# Patient Record
Sex: Male | Born: 1940
Health system: Southern US, Community
[De-identification: ages and names within clinical notes are randomized; demographics above are authoritative.]

## PROBLEM LIST (undated history)

## (undated) DIAGNOSIS — Z860100 Personal history of colon polyps, unspecified: Secondary | ICD-10-CM

## (undated) DIAGNOSIS — K921 Melena: Secondary | ICD-10-CM

## (undated) DIAGNOSIS — D696 Thrombocytopenia, unspecified: Secondary | ICD-10-CM

## (undated) DIAGNOSIS — B019 Varicella without complication: Secondary | ICD-10-CM

## (undated) DIAGNOSIS — R42 Dizziness and giddiness: Secondary | ICD-10-CM

## (undated) DIAGNOSIS — Z9889 Other specified postprocedural states: Secondary | ICD-10-CM

## (undated) DIAGNOSIS — I4891 Unspecified atrial fibrillation: Secondary | ICD-10-CM

## (undated) DIAGNOSIS — S86819A Strain of other muscle(s) and tendon(s) at lower leg level, unspecified leg, initial encounter: Secondary | ICD-10-CM

## (undated) DIAGNOSIS — S838X9A Sprain of other specified parts of unspecified knee, initial encounter: Secondary | ICD-10-CM

## (undated) DIAGNOSIS — M1990 Unspecified osteoarthritis, unspecified site: Secondary | ICD-10-CM

## (undated) DIAGNOSIS — K219 Gastro-esophageal reflux disease without esophagitis: Secondary | ICD-10-CM

## (undated) DIAGNOSIS — I493 Ventricular premature depolarization: Secondary | ICD-10-CM

## (undated) DIAGNOSIS — E785 Hyperlipidemia, unspecified: Secondary | ICD-10-CM

## (undated) DIAGNOSIS — I4949 Other premature depolarization: Secondary | ICD-10-CM

## (undated) DIAGNOSIS — I498 Other specified cardiac arrhythmias: Secondary | ICD-10-CM

## (undated) DIAGNOSIS — R531 Weakness: Secondary | ICD-10-CM

## (undated) DIAGNOSIS — R079 Chest pain, unspecified: Secondary | ICD-10-CM

## (undated) DIAGNOSIS — J301 Allergic rhinitis due to pollen: Secondary | ICD-10-CM

## (undated) DIAGNOSIS — F4323 Adjustment disorder with mixed anxiety and depressed mood: Secondary | ICD-10-CM

## (undated) HISTORY — DX: Other specified cardiac arrhythmias: I49.8

## (undated) HISTORY — DX: Personal history of colonic polyps: Z86.010

## (undated) HISTORY — DX: Strain of other muscle(s) and tendon(s) at lower leg level, unspecified leg, initial encounter: S86.819A

## (undated) HISTORY — DX: Gastro-esophageal reflux disease without esophagitis: K21.9

## (undated) HISTORY — DX: Thrombocytopenia, unspecified: D69.6

## (undated) HISTORY — DX: Melena: K92.1

## (undated) HISTORY — DX: Other premature depolarization: I49.49

## (undated) HISTORY — DX: Weakness: R53.1

## (undated) HISTORY — DX: Other specified postprocedural states: Z98.890

## (undated) HISTORY — DX: Unspecified osteoarthritis, unspecified site: M19.90

## (undated) HISTORY — DX: Ventricular premature depolarization: I49.3

## (undated) HISTORY — DX: Personal history of colon polyps, unspecified: Z86.0100

## (undated) HISTORY — DX: Hyperlipidemia, unspecified: E78.5

## (undated) HISTORY — DX: Adjustment disorder with mixed anxiety and depressed mood: F43.23

## (undated) HISTORY — DX: Sprain of other specified parts of unspecified knee, initial encounter: S83.8X9A

## (undated) HISTORY — DX: Allergic rhinitis due to pollen: J30.1

## (undated) HISTORY — DX: Dizziness and giddiness: R42

## (undated) HISTORY — DX: Chest pain, unspecified: R07.9

## (undated) HISTORY — DX: Unspecified atrial fibrillation: I48.91

## (undated) HISTORY — DX: Varicella without complication: B01.9

---

## 1984-08-09 HISTORY — PX: OTHER SURGICAL HISTORY: SHX169

## 1994-08-09 HISTORY — PX: OTHER SURGICAL HISTORY: SHX169

## 2000-12-29 ENCOUNTER — Ambulatory Visit (HOSPITAL_BASED_OUTPATIENT_CLINIC_OR_DEPARTMENT_OTHER): Admission: RE | Admit: 2000-12-29 | Discharge: 2000-12-29 | Payer: Self-pay | Admitting: Orthopedic Surgery

## 2001-04-28 ENCOUNTER — Encounter (INDEPENDENT_AMBULATORY_CARE_PROVIDER_SITE_OTHER): Payer: Self-pay | Admitting: Specialist

## 2001-04-28 ENCOUNTER — Other Ambulatory Visit: Admission: RE | Admit: 2001-04-28 | Discharge: 2001-04-28 | Payer: Self-pay | Admitting: Gastroenterology

## 2003-10-31 ENCOUNTER — Encounter: Payer: Self-pay | Admitting: Family Medicine

## 2005-08-09 HISTORY — PX: ABDOMINAL ADHESION SURGERY: SHX90

## 2006-08-12 LAB — HM COLONOSCOPY: HM Colonoscopy: NORMAL

## 2007-02-10 ENCOUNTER — Emergency Department (HOSPITAL_COMMUNITY): Admission: EM | Admit: 2007-02-10 | Discharge: 2007-02-10 | Payer: Self-pay | Admitting: *Deleted

## 2009-01-13 ENCOUNTER — Ambulatory Visit: Payer: Self-pay | Admitting: Family Medicine

## 2009-01-13 DIAGNOSIS — Z860100 Personal history of colon polyps, unspecified: Secondary | ICD-10-CM

## 2009-01-13 DIAGNOSIS — E785 Hyperlipidemia, unspecified: Secondary | ICD-10-CM | POA: Insufficient documentation

## 2009-01-13 HISTORY — DX: Personal history of colonic polyps: Z86.010

## 2009-01-13 HISTORY — DX: Personal history of colon polyps, unspecified: Z86.0100

## 2009-05-08 ENCOUNTER — Encounter: Payer: Self-pay | Admitting: Family Medicine

## 2009-09-08 ENCOUNTER — Ambulatory Visit: Payer: Self-pay | Admitting: Family Medicine

## 2009-09-08 DIAGNOSIS — S838X9A Sprain of other specified parts of unspecified knee, initial encounter: Secondary | ICD-10-CM

## 2009-09-08 DIAGNOSIS — F4323 Adjustment disorder with mixed anxiety and depressed mood: Secondary | ICD-10-CM

## 2009-09-08 DIAGNOSIS — S86819A Strain of other muscle(s) and tendon(s) at lower leg level, unspecified leg, initial encounter: Secondary | ICD-10-CM

## 2009-09-08 HISTORY — DX: Adjustment disorder with mixed anxiety and depressed mood: F43.23

## 2009-09-08 HISTORY — DX: Sprain of other specified parts of unspecified knee, initial encounter: S83.8X9A

## 2009-09-10 ENCOUNTER — Encounter: Admission: RE | Admit: 2009-09-10 | Discharge: 2009-09-23 | Payer: Self-pay | Admitting: Family Medicine

## 2009-09-10 ENCOUNTER — Encounter: Payer: Self-pay | Admitting: Family Medicine

## 2009-09-11 ENCOUNTER — Ambulatory Visit: Payer: Self-pay | Admitting: Licensed Clinical Social Worker

## 2009-09-15 ENCOUNTER — Ambulatory Visit: Payer: Self-pay | Admitting: Licensed Clinical Social Worker

## 2009-09-22 ENCOUNTER — Encounter: Payer: Self-pay | Admitting: Family Medicine

## 2009-09-25 ENCOUNTER — Ambulatory Visit: Payer: Self-pay | Admitting: Family Medicine

## 2009-09-25 LAB — CONVERTED CEMR LAB
ALT: 20 units/L (ref 0–53)
AST: 26 units/L (ref 0–37)
Albumin: 4 g/dL (ref 3.5–5.2)
Alkaline Phosphatase: 42 units/L (ref 39–117)
BUN: 15 mg/dL (ref 6–23)
Basophils Absolute: 0 10*3/uL (ref 0.0–0.1)
Basophils Relative: 0.4 % (ref 0.0–3.0)
Bilirubin Urine: NEGATIVE
Bilirubin, Direct: 0.1 mg/dL (ref 0.0–0.3)
Blood in Urine, dipstick: NEGATIVE
CO2: 31 meq/L (ref 19–32)
Calcium: 9.2 mg/dL (ref 8.4–10.5)
Chloride: 107 meq/L (ref 96–112)
Cholesterol: 145 mg/dL (ref 0–200)
Creatinine, Ser: 1 mg/dL (ref 0.4–1.5)
Eosinophils Absolute: 0.1 10*3/uL (ref 0.0–0.7)
Eosinophils Relative: 1 % (ref 0.0–5.0)
GFR calc non Af Amer: 79.04 mL/min (ref >60)
Glucose, Bld: 109 mg/dL — ABNORMAL HIGH (ref 70–99)
Glucose, Urine, Semiquant: NEGATIVE
HCT: 44.8 % (ref 39.0–52.0)
HDL: 52.3 mg/dL (ref >39.00)
Hemoglobin: 14.8 g/dL (ref 13.0–17.0)
Ketones, urine, test strip: NEGATIVE
LDL Cholesterol: 68 mg/dL (ref 0–99)
Lymphocytes Relative: 39.2 % (ref 12.0–46.0)
Lymphs Abs: 2.2 10*3/uL (ref 0.7–4.0)
MCHC: 33 g/dL (ref 30.0–36.0)
MCV: 97.5 fL (ref 78.0–100.0)
Monocytes Absolute: 0.5 10*3/uL (ref 0.1–1.0)
Monocytes Relative: 8.9 % (ref 3.0–12.0)
Neutro Abs: 2.8 10*3/uL (ref 1.4–7.7)
Neutrophils Relative %: 50.5 % (ref 43.0–77.0)
Nitrite: NEGATIVE
PSA: 1.53 ng/mL (ref 0.10–4.00)
Platelets: 142 10*3/uL — ABNORMAL LOW (ref 150.0–400.0)
Potassium: 4.5 meq/L (ref 3.5–5.1)
Protein, U semiquant: NEGATIVE
RBC: 4.59 M/uL (ref 4.22–5.81)
RDW: 12.6 % (ref 11.5–14.6)
Sodium: 143 meq/L (ref 135–145)
Specific Gravity, Urine: 1.025
TSH: 1.68 microintl units/mL (ref 0.35–5.50)
Total Bilirubin: 0.8 mg/dL (ref 0.3–1.2)
Total CHOL/HDL Ratio: 3
Total Protein: 7.1 g/dL (ref 6.0–8.3)
Triglycerides: 125 mg/dL (ref 0.0–149.0)
Urobilinogen, UA: 0.2
VLDL: 25 mg/dL (ref 0.0–40.0)
WBC Urine, dipstick: NEGATIVE
WBC: 5.6 10*3/uL (ref 4.5–10.5)
pH: 6.5

## 2009-10-03 ENCOUNTER — Ambulatory Visit: Payer: Self-pay | Admitting: Family Medicine

## 2009-12-19 ENCOUNTER — Ambulatory Visit: Payer: Self-pay | Admitting: Family Medicine

## 2009-12-22 ENCOUNTER — Telehealth: Payer: Self-pay | Admitting: Family Medicine

## 2009-12-22 LAB — CONVERTED CEMR LAB
Basophils Relative: 0.7 % (ref 0.0–3.0)
Eosinophils Absolute: 0.1 10*3/uL (ref 0.0–0.7)
Hemoglobin: 15 g/dL (ref 13.0–17.0)
Lymphocytes Relative: 46.1 % — ABNORMAL HIGH (ref 12.0–46.0)
MCHC: 34.4 g/dL (ref 30.0–36.0)
Neutro Abs: 2.5 10*3/uL (ref 1.4–7.7)
RBC: 4.54 M/uL (ref 4.22–5.81)

## 2010-03-05 ENCOUNTER — Ambulatory Visit: Payer: Self-pay | Admitting: Family Medicine

## 2010-03-05 DIAGNOSIS — R42 Dizziness and giddiness: Secondary | ICD-10-CM

## 2010-03-05 DIAGNOSIS — D696 Thrombocytopenia, unspecified: Secondary | ICD-10-CM

## 2010-03-05 HISTORY — DX: Dizziness and giddiness: R42

## 2010-03-05 HISTORY — DX: Thrombocytopenia, unspecified: D69.6

## 2010-03-10 ENCOUNTER — Ambulatory Visit: Payer: Self-pay | Admitting: Family Medicine

## 2010-03-10 DIAGNOSIS — I498 Other specified cardiac arrhythmias: Secondary | ICD-10-CM

## 2010-03-10 HISTORY — DX: Other specified cardiac arrhythmias: I49.8

## 2010-03-12 ENCOUNTER — Telehealth: Payer: Self-pay | Admitting: Family Medicine

## 2010-03-13 ENCOUNTER — Ambulatory Visit: Payer: Self-pay

## 2010-03-13 ENCOUNTER — Encounter: Payer: Self-pay | Admitting: Cardiovascular Disease

## 2010-03-16 ENCOUNTER — Emergency Department (HOSPITAL_COMMUNITY): Admission: EM | Admit: 2010-03-16 | Discharge: 2010-03-16 | Payer: Self-pay | Admitting: Emergency Medicine

## 2010-03-16 ENCOUNTER — Ambulatory Visit: Payer: Self-pay | Admitting: Cardiology

## 2010-03-16 ENCOUNTER — Telehealth: Payer: Self-pay | Admitting: Family Medicine

## 2010-03-16 ENCOUNTER — Encounter: Payer: Self-pay | Admitting: Internal Medicine

## 2010-03-17 ENCOUNTER — Telehealth: Payer: Self-pay | Admitting: Family Medicine

## 2010-03-17 ENCOUNTER — Ambulatory Visit: Payer: Self-pay | Admitting: Internal Medicine

## 2010-03-17 DIAGNOSIS — I4949 Other premature depolarization: Secondary | ICD-10-CM | POA: Insufficient documentation

## 2010-03-17 HISTORY — DX: Other premature depolarization: I49.49

## 2010-03-19 ENCOUNTER — Telehealth: Payer: Self-pay | Admitting: Internal Medicine

## 2010-03-19 ENCOUNTER — Telehealth: Payer: Self-pay | Admitting: Family Medicine

## 2010-03-19 DIAGNOSIS — R079 Chest pain, unspecified: Secondary | ICD-10-CM

## 2010-03-19 HISTORY — DX: Chest pain, unspecified: R07.9

## 2010-03-24 ENCOUNTER — Telehealth (INDEPENDENT_AMBULATORY_CARE_PROVIDER_SITE_OTHER): Payer: Self-pay | Admitting: *Deleted

## 2010-03-25 ENCOUNTER — Encounter: Payer: Self-pay | Admitting: Cardiovascular Disease

## 2010-03-25 ENCOUNTER — Encounter (HOSPITAL_COMMUNITY): Admission: RE | Admit: 2010-03-25 | Discharge: 2010-04-28 | Payer: Self-pay | Admitting: Cardiovascular Disease

## 2010-03-25 ENCOUNTER — Ambulatory Visit: Payer: Self-pay | Admitting: Cardiovascular Disease

## 2010-03-25 ENCOUNTER — Ambulatory Visit: Payer: Self-pay

## 2010-03-30 ENCOUNTER — Ambulatory Visit: Payer: Self-pay | Admitting: Internal Medicine

## 2010-03-30 ENCOUNTER — Ambulatory Visit: Payer: Self-pay

## 2010-03-30 ENCOUNTER — Ambulatory Visit (HOSPITAL_COMMUNITY): Admission: RE | Admit: 2010-03-30 | Discharge: 2010-03-30 | Payer: Self-pay | Admitting: Internal Medicine

## 2010-04-03 ENCOUNTER — Telehealth: Payer: Self-pay | Admitting: Family Medicine

## 2010-04-03 ENCOUNTER — Telehealth: Payer: Self-pay | Admitting: Internal Medicine

## 2010-04-07 ENCOUNTER — Telehealth: Payer: Self-pay | Admitting: Family Medicine

## 2010-04-10 ENCOUNTER — Telehealth: Payer: Self-pay | Admitting: Internal Medicine

## 2010-05-06 ENCOUNTER — Ambulatory Visit: Payer: Self-pay | Admitting: Internal Medicine

## 2010-05-18 ENCOUNTER — Telehealth: Payer: Self-pay | Admitting: Internal Medicine

## 2010-05-18 ENCOUNTER — Telehealth: Payer: Self-pay | Admitting: Family Medicine

## 2010-07-16 ENCOUNTER — Ambulatory Visit: Payer: Self-pay | Admitting: Internal Medicine

## 2010-07-16 ENCOUNTER — Encounter: Payer: Self-pay | Admitting: Internal Medicine

## 2010-09-06 LAB — CONVERTED CEMR LAB
Basophils Absolute: 0 10*3/uL (ref 0.0–0.1)
Eosinophils Absolute: 0.1 10*3/uL (ref 0.0–0.7)
HCT: 43.3 % (ref 39.0–52.0)
Hemoglobin: 14.9 g/dL (ref 13.0–17.0)
Lymphocytes Relative: 41.9 % (ref 12.0–46.0)
Lymphs Abs: 2.5 10*3/uL (ref 0.7–4.0)
MCHC: 34.5 g/dL (ref 30.0–36.0)
MCV: 96.3 fL (ref 78.0–100.0)
Monocytes Absolute: 0.5 10*3/uL (ref 0.1–1.0)
Neutro Abs: 2.7 10*3/uL (ref 1.4–7.7)
RDW: 13.9 % (ref 11.5–14.6)

## 2010-09-10 ENCOUNTER — Telehealth: Payer: Self-pay | Admitting: Internal Medicine

## 2010-09-10 NOTE — Assessment & Plan Note (Signed)
Summary: L THIGH PAIN // RS   Vital Signs:  Patient profile:   70 year old male Temp:     98.8 degrees F oral BP sitting:   120 / 90  (left arm) Cuff size:   regular  Vitals Entered By: Sid Falcon LPN (September 08, 2009 11:57 AM) CC: Left thigh pain X several years   History of Present Illness: Patient seen with left hamstring pain 1-1/2 years duration. Saw orthopedist when this first started. Initially thought he may have strained this doing hamstring exercises. Pain has been very low grade and normal since then and actually improved and goes away with exercise and noted after periods of inactivity. Discomfort along mid aspect of hamstring. Heat and local massage help. Recently resumed hamstring exercises. Never had any physical therapy.  No hip pain. Denies any appetite or weight changes. Stop simvastatin several weeks ago wondering if this was a precipitant but this did not help.  Other issue is increased stress and anxiety issues and possibly some depressive issues. Increased stressors with daughter battling alcoholism he has 2 elderly friends he helps take care of.  generally sleeps okay. Exercising regularly. No suicidal ideation.  Feels overloaded with stress at times.  Preventive Screening-Counseling & Management  Alcohol-Tobacco     Smoking Status: never     Year Started: 1961     Year Quit: 1973  Caffeine-Diet-Exercise     Does Patient Exercise: yes  Allergies: No Known Drug Allergies  Past History:  Past Medical History: Last updated: 01/13/2009 Arthritis Blood in stool Chicken pox hay fever/allergies Colonic polyps, hx of Hyperlipidemia  Past Surgical History: Last updated: 01/13/2009 Herniated disk surgeries, C 6-7 1986, L-5 1996 Compression L-2 lumbar 2 benign lesions removed from abdomen 07'  Social History: Last updated: 09/08/2009 Retired Married Never Smoked Regular exercise-yes  Social History: Retired Married Never Smoked Regular  exercise-yes Smoking Status:  never Does Patient Exercise:  yes  Review of Systems  The patient denies anorexia, fever, weight loss, chest pain, dyspnea on exertion, peripheral edema, hematuria, incontinence, muscle weakness, suspicious skin lesions, and enlarged lymph nodes.    Physical Exam  General:  Well-developed,well-nourished,in no acute distress; alert,appropriate and cooperative throughout examination Neck:  No deformities, masses, or tenderness noted. Lungs:  Normal respiratory effort, chest expands symmetrically. Lungs are clear to auscultation, no crackles or wheezes. Heart:  Normal rate and regular rhythm. S1 and S2 normal without gallop, murmur, click, rub or other extra sounds. Extremities:  no edema. 2+ DP pulses bilaterally.  No reproducible hamstring tenderness. Neurologic:  full strength lower extremities.  2 plus reflexes throughout.  Normal sensory function. Skin:  no rashes and no suspicious lesions.     Impression & Recommendations:  Problem # 1:  MUSCLE STRAIN, HAMSTRING MUSCLE (ICD-844.8) Assessment New Trial of PT. Orders: Physical Therapy Referral (PT)  Problem # 2:  ADJ DISORDER WITH MIXED ANXIETY & DEPRESSED MOOD (ICD-309.28) Assessment: New Recommended consideration for counseling and names given.  Start Sertraline 50 mg once daily and office f/u 3 weeks.  Complete Medication List: 1)  Simvastatin 20 Mg Tabs (Simvastatin) .... Once daily 2)  Sertraline Hcl 50 Mg Tabs (Sertraline hcl) .... One by mouth once daily  Patient Instructions: 1)  Schedule followup visit in approximately 3 weeks to reassess Prescriptions: SERTRALINE HCL 50 MG TABS (SERTRALINE HCL) one by mouth once daily  #30 x 5   Entered and Authorized by:   Evelena Peat MD   Signed by:   Smitty Cords  Elizaveta Mattice MD on 09/08/2009   Method used:   Print then Give to Patient   RxID:   [id number]

## 2010-09-10 NOTE — Progress Notes (Signed)
Summary: Symptoms worse, going to [facility] ER  Phone Note Call from Patient Call back at Work Phone [phone]   Caller: Patient Call For: Evelena Peat MD Summary of Call: VM from pt reporting his bradycardia is getting worse...his wife is taking him to the hospital.  He did drop off ehr heart monitor to Cardiology this AM, however they cannot see him until 8/25.  He feels his symptoms have deterioriated.   He would like to talk to Dr Caryl Never.  cell [phone], wife cell [phone] Initial call taken by: Sid Falcon LPN,  March 16, 2010 12:44 PM  Follow-up for Phone Call        spoke with pt .  Has had progressive symptoms of dizziness and weakness with resting pulse 40s.  He turned in Event monitor today.  He is at hospital now with ER eval pending.  He has no syncope or chest pain. Follow-up by: Evelena Peat MD,  March 16, 2010 1:04 PM

## 2010-09-10 NOTE — Assessment & Plan Note (Signed)
Summary: CPX // RS   Vital Signs:  Patient profile:   70 year old male Height:      68.25 inches Weight:      175 pounds Temp:     98.0 degrees F oral Pulse rate:   80 / minute Pulse rhythm:   regular Resp:     12 per minute BP sitting:   130 / 82  (left arm) Cuff size:   regular  Vitals Entered By: Sid Falcon LPN (October 03, 2009 10:29 AM) CC: CPX, labs done   History of Present Illness: Patient seen for complete physical examination. History of hyperlipidemia treated with simvastatin. Last tetanus 2002. Had Pneumovax age 66. Prior Zostavax vaccine. Colonoscopy 2008 and normal.  Patient had some recent depressive issues. Took sertraline only for a few days and had profuse perspiration with exercise and stopped. Has had some counseling. Overall feels he is coping much better. Exercises regularly.    Preventive Screening-Counseling & Management  Alcohol-Tobacco     Smoking Status: quit     Year Quit: 1975  Allergies (verified): No Known Drug Allergies  Past History:  Past Medical History: Last updated: 01/13/2009 Arthritis Blood in stool Chicken pox hay fever/allergies Colonic polyps, hx of Hyperlipidemia  Past Surgical History: Last updated: 01/13/2009 Herniated disk surgeries, C 6-7 1986, L-5 1996 Compression L-2 lumbar 2 benign lesions removed from abdomen 07'  Family History: Last updated: 01/13/2009 Family History Hypertension Family history Sudden death Leukemia, brother  Social History: Last updated: 09/08/2009 Retired Married Never Smoked Regular exercise-yes  Risk Factors: Exercise: yes (09/08/2009)  Risk Factors: Smoking Status: quit (10/03/2009) PMH-FH-SH reviewed for relevance  Social History: Smoking Status:  quit  Review of Systems  The patient denies anorexia, fever, weight loss, weight gain, vision loss, decreased hearing, hoarseness, chest pain, syncope, dyspnea on exertion, peripheral edema, prolonged cough, headaches,  hemoptysis, abdominal pain, melena, hematochezia, severe indigestion/heartburn, hematuria, incontinence, genital sores, muscle weakness, suspicious skin lesions, transient blindness, difficulty walking, unusual weight change, abnormal bleeding, enlarged lymph nodes, and testicular masses.    Physical Exam  General:  Well-developed,well-nourished,in no acute distress; alert,appropriate and cooperative throughout examination Eyes:  No corneal or conjunctival inflammation noted. EOMI. Perrla. Funduscopic exam benign, without hemorrhages, exudates or papilledema. Vision grossly normal. Ears:  External ear exam shows no significant lesions or deformities.  Otoscopic examination reveals clear canals, tympanic membranes are intact bilaterally without bulging, retraction, inflammation or discharge. Hearing is grossly normal bilaterally. Mouth:  Oral mucosa and oropharynx without lesions or exudates.  Teeth in good repair. Neck:  No deformities, masses, or tenderness noted. Lungs:  Normal respiratory effort, chest expands symmetrically. Lungs are clear to auscultation, no crackles or wheezes. Heart:  Normal rate and regular rhythm. S1 and S2 normal without gallop, murmur, click, rub or other extra sounds. Abdomen:  Bowel sounds positive,abdomen soft and non-tender without masses, organomegaly or hernias noted. Genitalia:  Testes bilaterally descended without nodularity, tenderness or masses. No scrotal masses or lesions. No penis lesions or urethral discharge. Extremities:  No clubbing, cyanosis, edema, or deformity noted with normal full range of motion of all joints.   Neurologic:  No cranial nerve deficits noted. Station and gait are normal. Plantar reflexes are down-going bilaterally. DTRs are symmetrical throughout. Sensory, motor and coordinative functions appear intact. Skin:  Intact without suspicious lesions or rashes Cervical Nodes:  No lymphadenopathy noted Psych:  Cognition and judgment appear  intact. Alert and cooperative with normal attention span and concentration. No apparent delusions, illusions,  hallucinations   Impression & Recommendations:  Problem # 1:  Preventive Health Care (ICD-V70.0) labs reviewed with patient and all favorable with the exception of mildly decreased platelet count 142,000. Patient will check prior labs from [place] for comparison.  CBC in one month if this is a new finding.  immunizations up to date. cont regular exercise.   Complete Medication List: 1)  Simvastatin 20 Mg Tabs (Simvastatin) .... Once daily  Patient Instructions: 1)  It is important that you exercise reguarly at least 20 minutes 5 times a week. If you develop chest pain, have severe difficulty breathing, or feel very tired, stop exercising immediately and seek medical attention.  2)  Check prior labs and if platelet count was not low previously (< 150,000) ,followup in one to 2 months for repeat CBC

## 2010-09-10 NOTE — Progress Notes (Signed)
Summary: pt has medication question  Phone Note Call from Patient Call back at Home Phone [phone]   Caller: Patient Reason for Call: Talk to Nurse, Talk to Doctor Summary of Call: pt wants to know should he go back on zoloft since he was told his irregular heart rate is due to stress Initial call taken by: Omer Jack,  April 03, 2010 1:08 PM  Follow-up for Phone Call        I spoke with the pt and he is wondering if the family stress that he is dealing with could be causing his palpitations.  The pt said he noticed his symptoms around the time his family stress started.  I asked the pt to speak with his PCP in regards to Zoloft.  Pt agreed with plan. I spoke with Orlinda Blalock D and this pt can take Zoloft and Metoprolol  Follow-up by: Julieta Gutting, RN, BSN,  April 03, 2010 1:45 PM

## 2010-09-10 NOTE — Miscellaneous (Signed)
Summary: Discharge Summary for PT [facility]  Discharge Summary for PT Services/[place]   Imported By: Maryln Gottron 10/01/2009 10:29:36  _____________________________________________________________________  External Attachment:    Type:   Image     Comment:   External Document

## 2010-09-10 NOTE — Progress Notes (Signed)
Summary: nuc pre procedure  Phone Note Outgoing Call Call back at Home Phone [phone]   Call placed by: CHasspacher,RN Call placed to: Patient Action Taken: Phone Call Completed Reason for Call: Confirm/change Appt Summary of Call: Left message with information on [employer] Information Sheet (see scanned document for details).      Nuclear Med Background Indications for Stress Test: Evaluation for Ischemia, Post Hospital  Indications Comments: 03/17/10 ER at [facility] with palpitations/presyncope    Symptoms: Chest Pain with Exertion, Dizziness, Fatigue with Exertion, Light-Headedness, Palpitations  Symptoms Comments: PVC's frequent   Nuclear Pre-Procedure Cardiac Risk Factors: Lipids Height (in): 68.25

## 2010-09-10 NOTE — Progress Notes (Signed)
Summary: referral and talk to Dr. Caryl Never.  Phone Note Call from Patient   Caller: Patient Call For: Evelena Peat MD Summary of Call: [phone] Pt would like a referral to a Cardiologist ASAP, and to talk to Dr. Caryl Never.  Initial call taken by: Lynann Beaver CMA,  March 12, 2010 2:16 PM  Follow-up for Phone Call        spoke with pt.  Heart rate occ down to 36 at rest.  No syncope.  Event monitor set up and will proceed with referral to EP specialist. Follow-up by: Evelena Peat MD,  March 12, 2010 3:23 PM

## 2010-09-10 NOTE — Progress Notes (Signed)
Summary: simvastatin ?  Phone Note Call from Patient Call back at Home Phone [phone]   Caller: vm Summary of Call: Can simvastatin be contributing to PVC's?  If so, can I stop it immediately and adjust diet? Knows it will be tomorrow when Dr. B gets message.  Rudy Jew, RN  May 18, 2010 11:03 AM  Initial call taken by: Rudy Jew, RN,  May 18, 2010 9:37 AM  Follow-up for Phone Call        I am not aware of any evidence linking Simvastatin with PVCs and my recommendation would be to continue with his cholesterol med. Follow-up by: Evelena Peat MD,  May 18, 2010 4:59 PM  Additional Follow-up for Phone Call Additional follow up Details #1::        Phone Call Completed Additional Follow-up by: Rudy Jew, RN,  May 22, 2010 9:06 AM

## 2010-09-10 NOTE — Progress Notes (Signed)
Summary: FYI  Phone Note Call from Patient   Caller: Patient Call For: Evelena Peat MD Summary of Call: Pt wants to be sure Dr Caryl Never is aware of his chest pain episode this am while exercising.  Has spoken to Cardiology, and does not need t see Dr. Caryl Never. Initial call taken by: Lynann Beaver CMA,  March 19, 2010 2:32 PM  Follow-up for Phone Call        noted Follow-up by: Evelena Peat MD,  March 19, 2010 3:31 PM

## 2010-09-10 NOTE — Assessment & Plan Note (Signed)
Summary: PER CHECK OUT/SF   Visit Type:  Follow-up Primary Provider:  Evelena Peat MD   History of Present Illness: Don Medina returns today for followup.  He is a pleasant 70 yo man with a h/o bradycardia, symptomatic PVC's and orthostasis.   He had a 2D echo which demonstrated normal LV function.He exercises regularly and has no limitation.  He does note some fatigue while on the low dose of beta blocker. His palpitations are much improved but he continues to be bothered by orthostasis. He denies frank syncope.  Current Medications (verified): 1)  Simvastatin 20 Mg Tabs (Simvastatin) .... Once Daily 2)  Metoprolol Succinate 25 Mg Xr24h-Tab (Metoprolol Succinate) .... Take 1 Tablet By Mouth Daily 3)  Aspirin 81 Mg Tbec (Aspirin) .... Take One Tablet By Mouth Daily 4)  Glucosamine-Chondroitin   Caps (Glucosamine-Chondroit-Vit C-Mn) .... Once Daily 5)  Fish Oil   Oil (Fish Oil) .... 1200mg  Two Times A Day 6)  Folic Acid   Powd (Folic Acid) .... 800mg  Qd 7)  Calcium-Vitamin D .... Once Daily  Allergies (verified): No Known Drug Allergies  Past History:  Past Medical History: Last updated: 01/13/2009 Arthritis Blood in stool Chicken pox hay fever/allergies Colonic polyps, hx of Hyperlipidemia  Past Surgical History: Last updated: 01/13/2009 Herniated disk surgeries, C 6-7 1986, L-5 1996 Compression L-2 lumbar 2 benign lesions removed from abdomen 07'  Review of Systems  The patient denies chest pain, syncope, dyspnea on exertion, and peripheral edema.    Vital Signs:  Patient profile:   70 year old male Height:      68.25 inches Weight:      164 pounds BMI:     24.84 Pulse rate:   52 / minute BP sitting:   110 / 78  (left arm)  Vitals Entered By: Laurance Flatten CMA (July 16, 2010 9:57 AM)  Physical Exam  General:  Well appearing , well developed, well nourished middle aged man in no acute distress.  HEENT: normal Neck: supple. No JVD. Carotids 2+  bilaterally no bruits Cor: IRRR no rubs, gallops or murmu. PMI is not enlarged or laterally displaced. Lungs: CTA with no wheezes, rales, or rhonchi. Ab: soft, nontender. nondistended. No HSM. Good bowel sounds Ext: warm. no cyanosis, clubbing or edema Neuro: alert and oriented. Grossly nonfocal. affect pleasant    EKG  Procedure date:  07/16/2010  Findings:      Sinus bradycardia with rate of:  52.  Impression & Recommendations:  Problem # 1:  ORTHOSTATIC DIZZINESS (ICD-780.4) His main problem today is orthostasis.  I have asked him to increase his fluid and sodium intake.  He will try to change.  Problem # 2:  BRADYCARDIA (ICD-427.89) He is tolerating his meds as below. His updated medication list for this problem includes:    Metoprolol Succinate 25 Mg Xr24h-tab (Metoprolol succinate) .[name] Kitchen... Take 1 tablet by mouth daily    Aspirin 81 Mg Tbec (Aspirin) .[name] Kitchen... Take one tablet by mouth daily  Problem # 3:  OTHER PREMATURE BEATS (ICD-427.69) His PVC symptoms are well controlled. He will continue his meds for now. His updated medication list for this problem includes:    Metoprolol Succinate 25 Mg Xr24h-tab (Metoprolol succinate) .[name] Kitchen... Take 1 tablet by mouth daily    Aspirin 81 Mg Tbec (Aspirin) .[name] Kitchen... Take one tablet by mouth daily  Patient Instructions: 1)  Your physician wants you to follow-up in: 6 months with Dr Don Medina will receive a reminder letter in the  mail two months in advance. If you don't receive a letter, please call our office to schedule the follow-up appointment.

## 2010-09-10 NOTE — Assessment & Plan Note (Signed)
Summary: nep/bradycardia/dizziness   Visit Type:  Follow-up Primary Provider:  Evelena Peat MD   History of Present Illness: Don Medina is referred today by Dr. Caryl Never for evaluation of symptomatic PVC's and ventricular ectopy.  The patient has been well for years and began experiencing palpiations several weeks ago.  He has had one episode where he arose from standing and fell to the ground without passing out.  He denies loss of consciousness.  He sought medical attention and was found to have frequent PVC's in a bigeminal fashion and a 24 hour holter demonstrated 38K PVC's.  He has been placed on Toprol.  He notes that his symptoms actually improve with exercise.  No c/p, sob, or peripheral edema.  Current Medications (verified): 1)  Simvastatin 20 Mg Tabs (Simvastatin) .... Once Daily 2)  Metoprolol Succinate 25 Mg Xr24h-Tab (Metoprolol Succinate) .... Take 1/2  Tablet By Mouth Daily 3)  Aspirin 81 Mg Tbec (Aspirin) .... Take One Tablet By Mouth Daily 4)  Glucosamine-Chondroitin   Caps (Glucosamine-Chondroit-Vit C-Mn) .... Once Daily 5)  Fish Oil   Oil (Fish Oil) .... 1200mg  Two Times A Day 6)  Folic Acid   Powd (Folic Acid) .... 800mg  Qd  Allergies (verified): No Known Drug Allergies  Past History:  Past Medical History: Last updated: 01/13/2009 Arthritis Blood in stool Chicken pox hay fever/allergies Colonic polyps, hx of Hyperlipidemia  Past Surgical History: Last updated: 01/13/2009 Herniated disk surgeries, C 6-7 1986, L-5 1996 Compression L-2 lumbar 2 benign lesions removed from abdomen 07'  Family History: Last updated: 01/13/2009 Family History Hypertension Family history Sudden death Leukemia, brother  Social History: Last updated: 09/08/2009 Retired Married Never Smoked Regular exercise-yes  Review of Systems       All systems reviewed and negative except as noted in the HPI.  Vital Signs:  Patient profile:   70 year old male Height:       68.25 inches Weight:      168 pounds BMI:     25.45 Pulse rate:   56 / minute BP sitting:   110 / 70  (left arm)  Vitals Entered By: Laurance Flatten CMA (March 17, 2010 1:17 PM)  Physical Exam  General:  Well appearing , well developed, well nourished middle aged man in no acute distress.  HEENT: normal Neck: supple. No JVD. Carotids 2+ bilaterally no bruits Cor: IRRR no rubs, gallops or murmu. PMI is not enlarged or laterally displaced. Lungs: CTA with no wheezes, rales, or rhonchi. Ab: soft, nontender. nondistended. No HSM. Good bowel sounds Ext: warm. no cyanosis, clubbing or edema Neuro: alert and oriented. Grossly nonfocal. affect pleasant    EKG  Procedure date:  03/16/2010  Findings:      Normal sinus rhythm with rate of:56.   PVC's noted.    Impression & Recommendations:  Problem # 1:  OTHER PREMATURE BEATS (ICD-427.69) He has PVC's likely originating from the RV, possible LV outflow tract.  He will continue his beta blocker for now and we will obtain a 2D echo.  Ablation vs medical therapy will depend on his response to beta blocker as well as his LV function.  For now he may exercise modestly. His updated medication list for this problem includes:    Metoprolol Succinate 25 Mg Xr24h-tab (Metoprolol succinate) .[name] Kitchen... Take 1 tablet by mouth daily    Aspirin 81 Mg Tbec (Aspirin) .[name] Kitchen... Take one tablet by mouth daily  Orders: Echocardiogram (Echo)  Problem # 2:  ORTHOSTATIC DIZZINESS (ICD-780.4)  His episode of near syncope appears to be more likely related to orthostasis than to an arrhythmia.  Will followup.  Problem # 3:  HYPERLIPIDEMIA (ICD-272.4) He will continue a low fat diet. His updated medication list for this problem includes:    Simvastatin 20 Mg Tabs (Simvastatin) ..... Once daily  Patient Instructions: 1)  Your physician recommends that you schedule a follow-up appointment in: 6 weeks with Dr Ladona Ridgel 2)  Your physician has requested that you have an  echocardiogram.  Echocardiography is a painless test that uses sound waves to create images of your heart. It provides your doctor with information about the size and shape of your heart and how well your heart's chambers and valves are working.  This procedure takes approximately one hour. There are no restrictions for this procedure. 3)  Your physician has recommended you make the following change in your medication: increase Toprol XL to one tablet daily in 3weeks

## 2010-09-10 NOTE — Progress Notes (Signed)
Summary: refill request  Phone Note Refill Request Message from:  Patient on April 10, 2010 4:42 PM  Refills Requested: Medication #1:  METOPROLOL SUCCINATE 25 MG XR24H-TAB Take 1 tablet by mouth daily [facility]   Method Requested: Telephone to Pharmacy Initial call taken by: Glynda Jaeger,  April 10, 2010 4:42 PM  Follow-up for Phone Call       Follow-up by: Judithe Modest CMA,  April 10, 2010 4:54 PM    Prescriptions: METOPROLOL SUCCINATE 25 MG XR24H-TAB (METOPROLOL SUCCINATE) Take 1 tablet by mouth daily  #30 x 6   Entered by:   Judithe Modest CMA   Authorized by:   Laren Boom, MD, [facility]   Signed by:   Judithe Modest CMA on 04/10/2010   Method used:   Electronically to        [facility]  [phone]* (retail)       [address]       [place], [place]  [address]       Ph: [phone] or [phone]       Fax: [phone]   RxID:   [id number]

## 2010-09-10 NOTE — Assessment & Plan Note (Signed)
Summary: continued dizziness/dm   Vital Signs:  Patient profile:   70 year old male Weight:      165 pounds Temp:     98.0 degrees F oral Pulse rate:   60 / minute Pulse (ortho):   44 / minute Pulse rhythm:   regular BP sitting:   110 / 80  (left arm) Cuff size:   regular  Vitals Entered By: Sid Falcon LPN (March 10, 2010 10:10 AM) CC: ongoing dizziness   History of Present Illness: Same symptoms as per prior note. Has been using gatorade with maybe minimal improvement. Continues to exercise without chest pain or any intolerance. Symptoms seem to occur most often when he first stands-after periods of sitting.  No syncope. No dyspnea.  He has no chest pain but does have sensation of prominent heart beat when he has the dizziness.  has not taken his pulse.  He has no hx of CAD or arrhythmia.  Excellent exercise tolerance and , again, has had no dizziness with exercise.    Allergies (verified): No Known Drug Allergies  Past History:  Past Medical History: Last updated: 01/13/2009 Arthritis Blood in stool Chicken pox hay fever/allergies Colonic polyps, hx of Hyperlipidemia  Family History: Last updated: 01/13/2009 Family History Hypertension Family history Sudden death Leukemia, brother  Social History: Last updated: 09/08/2009 Retired Married Never Smoked Regular exercise-yes PMH-FH-SH reviewed for relevance  Review of Systems  The patient denies anorexia, fever, chest pain, dyspnea on exertion, peripheral edema, prolonged cough, headaches, abdominal pain, melena, hematochezia, and severe indigestion/heartburn.    Physical Exam  General:  Well-developed,well-nourished,in no acute distress; alert,appropriate and cooperative throughout examination Head:  Normocephalic and atraumatic without obvious abnormalities. No apparent alopecia or balding. Eyes:  No corneal or conjunctival inflammation noted. EOMI. Perrla. Funduscopic exam benign, without  hemorrhages, exudates or papilledema. Vision grossly normal. Mouth:  Oral mucosa and oropharynx without lesions or exudates.  Teeth in good repair. Neck:  No deformities, masses, or tenderness noted. No bruits. Lungs:  Normal respiratory effort, chest expands symmetrically. Lungs are clear to auscultation, no crackles or wheezes. Heart:  pt is regular and noted to be bradycardic with pulse of 44 standing.   His pulse is consistently on exam 60-64 sitting and 44 standing. Abdomen:  soft, non-tender, and no masses.   Extremities:  no edema. Neurologic:  alert & oriented X3, cranial nerves II-XII intact, and strength normal in all extremities.     Impression & Recommendations:  Problem # 1:  BRADYCARDIA (ICD-427.89) Pt on exam today noted to have positional bradycardia wtith rate of 44 while standing.  Rate around 60-64 supine and sitting.  His mild symptoms correlated with slow pulse when standing.  Event monitor and will probably need to see EP specialist.  It does not appear that orthostasis is a signif component of his dizziness.  Repeat EKG today shows NSR with rate of 63.  No evidence for heart block. Orders: EKG w/ Interpretation (93000) Cardiology Referral (Cardiology)  Complete Medication List: 1)  Simvastatin 20 Mg Tabs (Simvastatin) .... Once daily

## 2010-09-10 NOTE — Assessment & Plan Note (Signed)
Summary: VERTIGO & FELL DOWN/PS   Vital Signs:  Patient profile:   70 year old male Weight:      165 pounds Temp:     98.0 degrees F oral BP sitting:   120 / 80  (left arm) BP standing:   108 / 70 Cuff size:   regular  Vitals Entered By: Sid Falcon LPN (March 05, 2010 9:05 AM)  Serial Vital Signs/Assessments:  Time      Position  BP       Pulse  Resp  Temp     By           Lying RA  114/70                         Evelena Peat MD           Standing  108/70                         Evelena Peat MD  CC: Vertigo, fell yesterday   History of Present Illness: Patient seen for evaluation of dizziness.  Onset about 2 months ago. Mostly this occurs in the afternoon hours. He exercises around noon. Never has any exercise intolerance or dizziness with exercise. He describes a lightheadedness and orthostatic type symptoms with positional change such as lying or sitting to standing. Usually after several seconds symptoms improved. No vertigo symptoms. Yesterday fell after standing up quickly. He denies any chest pain or dyspnea. Very good exercise tolerance. Usually exercises at least one hour per day.  Able to get his exercise heart rate up to 150. No dizziness ever with exercise. No true syncope.  Not take any blood pressure medications. Hyperlipidemia treated with simvastatin. No history of anemia. Hemoglobin 14.8 in February of this year. No recent nausea, vomiting, or diarrhea.  patient had prediabetes by lab work last February. Has made conscious effort to reduce sugars and starches in diet has lost some weight due to his efforts.  Has good appetite   Allergies: No Known Drug Allergies  Past History:  Past Medical History: Last updated: 01/13/2009 Arthritis Blood in stool Chicken pox hay fever/allergies Colonic polyps, hx of Hyperlipidemia  Past Surgical History: Last updated: 01/13/2009 Herniated disk surgeries, C 6-7 1986, L-5 1996 Compression L-2 lumbar 2 benign  lesions removed from abdomen 07'  Family History: Last updated: 01/13/2009 Family History Hypertension Family history Sudden death Leukemia, brother  Social History: Last updated: 09/08/2009 Retired Married Never Smoked Regular exercise-yes  Risk Factors: Exercise: yes (09/08/2009)  Risk Factors: Smoking Status: quit (10/03/2009) PMH-FH-SH reviewed for relevance  Review of Systems  The patient denies anorexia, fever, weight gain, vision loss, chest pain, dyspnea on exertion, peripheral edema, prolonged cough, headaches, hemoptysis, abdominal pain, melena, hematochezia, severe indigestion/heartburn, and difficulty walking.    Physical Exam  General:  Well-developed,well-nourished,in no acute distress; alert,appropriate and cooperative throughout examination Head:  Normocephalic and atraumatic without obvious abnormalities. No apparent alopecia or balding. Eyes:  pupils equal, pupils round, and pupils reactive to light.   Mouth:  Oral mucosa and oropharynx without lesions or exudates.  Teeth in good repair. Neck:  No deformities, masses, or tenderness noted. Lungs:  Normal respiratory effort, chest expands symmetrically. Lungs are clear to auscultation, no crackles or wheezes. Heart:  Normal rate and regular rhythm. S1 and S2 normal without gallop, murmur, click, rub or other extra sounds. Abdomen:  Bowel sounds positive,abdomen soft and non-tender without  masses, organomegaly or hernias noted. Msk:  No deformity or scoliosis noted of thoracic or lumbar spine.   Extremities:  No clubbing, cyanosis, edema, or deformity noted with normal full range of motion of all joints.   Neurologic:  alert & oriented X3, cranial nerves II-XII intact, strength normal in all extremities, and gait normal.   Skin:  no rashes and no suspicious lesions.   Cervical Nodes:  No lymphadenopathy noted Psych:  normally interactive, good eye contact, not anxious appearing, and not depressed appearing.      Impression & Recommendations:  Problem # 1:  ORTHOSTATIC DIZZINESS (ICD-780.4) Assessment New  Patient describes orthostasis though no significant drop today. His symptoms consistently occur early afternoon after his exercise sessions and suspect he has some volume depletion and not hydrating adequately.  EKG NSR and occ PVC-no acute findings.  Orders: EKG w/ Interpretation (93000) Venipuncture (16109) Specimen Handling (60454) TLB-CBC Platelet - w/Differential (85025-CBCD)  Problem # 2:  THROMBOCYTOPENIA (ICD-287.5)  Mild thrombocytopenia with labs last February. Patient requests recheck today. Do not suspect any anemia issues  Orders: Venipuncture (09811) Specimen Handling (91478) TLB-CBC Platelet - w/Differential (85025-CBCD)  Complete Medication List: 1)  Simvastatin 20 Mg Tabs (Simvastatin) .... Once daily  Patient Instructions: 1)  During more fluids especially after exercise. Consider supplement such as Gatorade

## 2010-09-10 NOTE — Progress Notes (Signed)
Summary: question re med  Phone Note Call from Patient   Caller: Patient  [phone] Reason for Call: Talk to Nurse, Talk to Doctor Summary of Call: after doing internet research, pt has determined that his statins may be the cause of his pvc's, should he stop taking the med? Initial call taken by: Glynda Jaeger,  May 18, 2010 9:41 AM  Follow-up for Phone Call        spoke with patient after discussing with DrTaylor.  Okay for patient to stop Simvastatin for 3-4 weeks and see if symptoms improve.  He will do this and call me back and let me know the results.   Dennis Bast, RN, BSN  May 18, 2010 10:32 AM

## 2010-09-10 NOTE — Progress Notes (Signed)
Summary: zoloft  Phone Note Call from Patient Call back at Home Phone [phone]   Caller: vm Reason for Call: Talk to Doctor Summary of Call: Should I go back to taking the Zoloft, which you prescribed, with the Toprol that cardiology prescribed?  I was told that my heart is healthy, but that it may be a stress issue that's causing irregular heartbeat.      Initial call taken by: Rudy Jew, RN,  April 03, 2010 2:40 PM  Follow-up for Phone Call        It is OK to take together.  OK to start back Zoloft and would start at 25 mg (one half tablet) daily for one week then titrate to one tablet. Pt would need f/u here one month after starting. Follow-up by: Evelena Peat MD,  April 03, 2010 3:16 PM  Additional Follow-up for Phone Call Additional follow up Details #1::        Phone Call Completed Additional Follow-up by: Rudy Jew, RN,  April 03, 2010 3:39 PM    New/Updated Medications: ZOLOFT 50 MG TABS (SERTRALINE HCL) 1/2 daily for one week then one daily.

## 2010-09-10 NOTE — Assessment & Plan Note (Signed)
Summary: Cardiology Nuclear Testing  Nuclear Med Background Indications for Stress Test: Evaluation for Ischemia, Post Hospital  Indications Comments: 03/17/10 ER at [facility] with palpitations/presyncope    Symptoms: Chest Pain with Exertion, Dizziness, Fatigue with Exertion, Light-Headedness, Palpitations  Symptoms Comments: PVC's frequent   Nuclear Pre-Procedure Cardiac Risk Factors: Lipids Caffeine/Decaff Intake: None NPO After: 7:00 AM Lungs: clear IV 0.9% NS with Angio Cath: 22g     IV Site: Rt Arm IV Started by: Bonnita Levan RN Chest Size (in) 40     Height (in): 68.25 Weight (lb): 165 BMI: 24.99  Nuclear Med Study 1 or 2 day study:  1 day     Stress Test Type:  Stress Reading MD:  Charlton Haws, MD     Referring MD:  M.Cooper Resting Radionuclide:  Technetium 11m Tetrofosmin     Resting Radionuclide Dose:  10.9 mCi  Stress Radionuclide:  Technetium 57m Tetrofosmin     Stress Radionuclide Dose:  32.1 mCi   Stress Protocol Exercise Time (min):  10:00 min     Max HR:  141 bpm     Predicted Max HR:  152 bpm  Max Systolic BP: 155 mm Hg     Percent Max HR:  92.76 %     METS: 11.70 Rate Pressure Product:  16109    Stress Test Technologist:  Milana Na EMT-P     Nuclear Technologist:  Domenic Polite CNMT  Rest Procedure  Myocardial perfusion imaging was performed at rest 45 minutes following the intravenous administration of Myoview Technetium 65m Tetrofosmin.  Stress Procedure  The patient exercised for 10:00. The patient stopped due to fatigue and denied any chest pain.  There were no significant ST-T wave changes and freq pvcs Bigemeny/Trigemeny.  Myoview was injected at peak exercise and myocardial perfusion imaging was performed after a brief delay.  QPS Raw Data Images:  Normal; no motion artifact; normal heart/lung ratio. Stress Images:  NI: Uniform and normal uptake of tracer in all myocardial segments. Rest Images:  Normal homogeneous uptake in all areas of  the myocardium. Subtraction (SDS):  Normal Transient Ischemic Dilatation:  1.02  (Normal <1.22)  Lung/Heart Ratio:  .30  (Normal <0.45)  Quantitative Gated Spect Images QGS cine images:  not gated  Findings Normal nuclear study      Overall Impression  Exercise Capacity: Good exercise capacity. BP Response: Normal blood pressure response. Clinical Symptoms: No chest pain ECG Impression: No significant ST segment change suggestive of ischemia. Overall Impression: Normal stress nuclear study. Overall Impression Comments: No gated images due to ventricular ectopy  Appended Document: Cardiology Nuclear Testing reviewed - normal study  Appended Document: Cardiology Nuclear Testing Pt aware of results by phone.

## 2010-09-10 NOTE — Progress Notes (Signed)
Summary: Pt called re: trip to ER yesterday  Phone Note Call from Patient Call back at Home Phone [phone]   Caller: Patient Summary of Call: Pt called and wanted to inform Dr. Caryl Never about visit to ER yesterday.  Pt saw Dr. Myrtis Ser and Theodore Demark from Cardiac Care Unit. They did EKG and monitored pt for about 5 hrs. Blood was taken. They dx pt with having PVC (Premature Ventricular Contractions). Pt was prescribed the med Tropol and are making arrangements for pt to have Echo Cardiodiogram and Treadmill prior to appt with Dr. Graciela Husbands on 04/02/10. Pt said that they had not read the Heart Monitor as of yesterday. This should be done by pts appt date.    Pls call to discuss if needed.    Initial call taken by: Lucy Antigua,  March 17, 2010 8:38 AM  Follow-up for Phone Call        spoke with pt.  Stable this AM.  He has been scheduled to see cardiologist later today. Follow-up by: Evelena Peat MD,  March 17, 2010 1:05 PM

## 2010-09-10 NOTE — Progress Notes (Signed)
Summary: Lab question  Phone Note Call from Patient   Caller: Patient Call For: Don Peat MD Summary of Call: Pt informed of labs, platelat count, 148, low side of normal.  Oct 2009 176, Oct 2010 174, Feb, 2011 142, now 148, pt is concerned. Also questioning if a glucose should have been drawn Initial call taken by: Sid Falcon LPN,  Dec 22, 2009 11:12 AM  Follow-up for Phone Call        I am not concerned about his stable platelet count.  He could see hematologist but I do not think they would rec any further evaluation.  We will be happy to check repeat fasting glucose anytime.  I don't think any urgency but needs at f/u (within 6 months). Follow-up by: Don Peat MD,  Dec 22, 2009 12:32 PM  Additional Follow-up for Phone Call Additional follow up Details #1::        Pt informed and he voiced his understanding Additional Follow-up by: Sid Falcon LPN,  Dec 22, 2009 3:30 PM

## 2010-09-10 NOTE — Progress Notes (Signed)
Summary: pt had chest pain during excercise  Phone Note Call from Patient Call back at Home Phone [phone]   Caller: Patient Reason for Call: Talk to Nurse, Talk to Doctor Complaint: Headache Summary of Call: pt went to the gym today and he expericed chest pain in left part of his chest just below rib cage and extreme exaustion after being on aliptical pt felt alittle faint after thet with walking around he went to last week and none of that happen. Initial call taken by: Omer Jack,  March 19, 2010 12:41 PM  Follow-up for Phone Call        SPOKE WITH PT 2 WEEKS AGO WAS GOING TO GYM WITH NO PROBLEMS TODAY HOWEVER WAS UNABLE TO FINISH WORKOUT ON ELIPTICAL OR LIFT WEIGHTS C/O "CHEST PAIN UNDER RIB CAGE" PAIN GONE NOW AFTER GETTING OFF OF ELIPITICAL FELT VERY WEAK AND FAINT .IS SCHEDULED FOR ECHO ON 03/30/10 @7 :30  AND F/U WITH DR Ladona Ridgel ON 05/06/10 AT 3:00 PM . PT CONCERNED FEELS AT THIS RATE WILL BE "DEAD." Follow-up by: Scherrie Bateman, LPN,  March 19, 2010 1:19 PM  Additional Follow-up for Phone Call Additional follow up Details #1::        PER DR COOPER SCHEDULE STRESS MYOVIEW TO R/O CAD.PT AWARE. STRESS MYOVIEW AUG 17 ,2011 AT 11:45 Additional Follow-up by: Scherrie Bateman, LPN,  March 19, 2010 1:44 PM  New Problems: CHEST PAIN (ICD-786.50)   New Problems: CHEST PAIN (ICD-786.50)

## 2010-09-10 NOTE — Miscellaneous (Signed)
Summary: Initial Assessment for PT Services/[place]  Initial Assessment for PT Services/[place]   Imported By: Maryln Gottron 09/15/2009 11:04:35  _____________________________________________________________________  External Attachment:    Type:   Image     Comment:   External Document

## 2010-09-10 NOTE — Assessment & Plan Note (Signed)
Summary: 6wk f/u sl   Visit Type:  Follow-up Primary Provider:  Evelena Peat MD  CC:  Irregular heart beat and Occ. feeling faint when getting up.  History of Present Illness: Mr. Saraceno returns today for followup.  He is a pleasant 70 yo man with a h/o bradycardia, symptomatic PVC's and orthostasis.  Evaluation to date demonstrated 38K PVC's in a 24 hour period.  He had a 2D echo which demonstrated normal LV function.  He has not had much in the way of additional dizziness and has had no syncope. He exercises regularly and has no limitation.  He does note some fatigue while on the low dose of beta blocker.  Current Medications (verified): 1)  Simvastatin 20 Mg Tabs (Simvastatin) .... Once Daily 2)  Metoprolol Succinate 25 Mg Xr24h-Tab (Metoprolol Succinate) .... Take 1 Tablet By Mouth Daily 3)  Aspirin 81 Mg Tbec (Aspirin) .... Take One Tablet By Mouth Daily 4)  Glucosamine-Chondroitin   Caps (Glucosamine-Chondroit-Vit C-Mn) .... Once Daily 5)  Fish Oil   Oil (Fish Oil) .... 1200mg  Two Times A Day 6)  Folic Acid   Powd (Folic Acid) .... 800mg  Qd  Allergies: No Known Drug Allergies  Past History:  Past Medical History: Last updated: 01/13/2009 Arthritis Blood in stool Chicken pox hay fever/allergies Colonic polyps, hx of Hyperlipidemia  Past Surgical History: Last updated: 01/13/2009 Herniated disk surgeries, C 6-7 1986, L-5 1996 Compression L-2 lumbar 2 benign lesions removed from abdomen 07'  Review of Systems  The patient denies chest pain, syncope, dyspnea on exertion, and peripheral edema.    Vital Signs:  Patient profile:   70 year old male Height:      68.25 inches Weight:      166 pounds Pulse rate:   44 / minute Pulse rhythm:   regular BP sitting:   100 / 78  (right arm)  Vitals Entered By: Jacquelin Hawking, CMA (May 06, 2010 2:59 PM)  Physical Exam  General:  Well appearing , well developed, well nourished middle aged man in no acute  distress.  HEENT: normal Neck: supple. No JVD. Carotids 2+ bilaterally no bruits Cor: IRRR no rubs, gallops or murmu. PMI is not enlarged or laterally displaced. Lungs: CTA with no wheezes, rales, or rhonchi. Ab: soft, nontender. nondistended. No HSM. Good bowel sounds Ext: warm. no cyanosis, clubbing or edema Neuro: alert and oriented. Grossly nonfocal. affect pleasant    Impression & Recommendations:  Problem # 1:  OTHER PREMATURE BEATS (ICD-427.69) His PVC's appear to be improved.  He has had fewer symptoms.  It does sound like he has had some fatigue on the beta blocker and I discussed other options for treatment including catheter ablation.  Trying flecainide would be another option.  I have recommended a period of watchful waiting.  I will see him back in several months. His updated medication list for this problem includes:    Metoprolol Succinate 25 Mg Xr24h-tab (Metoprolol succinate) .[name] Kitchen... Take 1 tablet by mouth daily    Aspirin 81 Mg Tbec (Aspirin) .[name] Kitchen... Take one tablet by mouth daily  Problem # 2:  BRADYCARDIA (ICD-427.89) His bradycardia does limit the amount of beta blocker he can be treated with.  Will follow. His updated medication list for this problem includes:    Metoprolol Succinate 25 Mg Xr24h-tab (Metoprolol succinate) .[name] Kitchen... Take 1 tablet by mouth daily    Aspirin 81 Mg Tbec (Aspirin) .[name] Kitchen... Take one tablet by mouth daily  Problem # 3:  ORTHOSTATIC DIZZINESS (  ICD-780.4) His symptoms are improved.  Will increase our sodium intake.  Patient Instructions: 1)  Your physician recommends that you schedule a follow-up appointment in: 3 months with Dr Ladona Ridgel

## 2010-09-10 NOTE — Procedures (Signed)
Summary: Summary Report  Summary Report   Imported By: Erle Crocker 03/17/2010 13:52:27  _____________________________________________________________________  External Attachment:    Type:   Image     Comment:   External Document

## 2010-09-10 NOTE — Progress Notes (Signed)
Summary: RX question  Phone Note Call from Patient   Caller: Patient Call For: Evelena Peat MD Summary of Call: Pt states since starting Zoloft, he cannot sleep.  Would like a different RX called to [facility] ([facility]  Needs to be generic.  Initial call taken by: Lynann Beaver CMA,  April 07, 2010 9:04 AM  Follow-up for Phone Call        Doubt Sertraline causing insomnia but let's change to Citalopram 20 mg by mouth once daily and rec follow up in 3 weeks to reassess. Disp #30 with no refill. Follow-up by: Evelena Peat MD,  April 07, 2010 7:02 PM    New/Updated Medications: CITALOPRAM HYDROBROMIDE 20 MG TABS (CITALOPRAM HYDROBROMIDE) one by mouth daily Prescriptions: CITALOPRAM HYDROBROMIDE 20 MG TABS (CITALOPRAM HYDROBROMIDE) one by mouth daily  #30 x 0   Entered by:   Lynann Beaver CMA   Authorized by:   Evelena Peat MD   Signed by:   Lynann Beaver CMA on 04/08/2010   Method used:   Electronically to        [facility]  [phone]* (retail)       [address]       [place], [place]  [address]       Ph: [phone] or [phone]       Fax: [phone]   RxID:   [id number]  Notified pt.

## 2010-09-10 NOTE — Progress Notes (Signed)
Summary: Medications and Medical History provided by patient  Medications and Medical History provided by patient   Imported By: Maryln Gottron 10/10/2009 13:52:46  _____________________________________________________________________  External Attachment:    Type:   Image     Comment:   External Document

## 2010-09-11 ENCOUNTER — Telehealth: Payer: Self-pay | Admitting: Internal Medicine

## 2010-09-16 NOTE — Progress Notes (Signed)
Summary: refill request-pt out needs today  Phone Note Refill Request Message from:  Patient on September 10, 2010 8:50 AM  Refills Requested: Medication #1:  METOPROLOL SUCCINATE 25 MG XR24H-TAB Take 1 tablet by mouth daily [facility]   Method Requested: Telephone to Pharmacy Initial call taken by: Glynda Jaeger,  September 10, 2010 8:50 AM  Follow-up for Phone Call        pt calling back to make sure he can get refill today since he is out Glynda Jaeger  September 10, 2010 1:50 PM    Prescriptions: METOPROLOL SUCCINATE 25 MG XR24H-TAB (METOPROLOL SUCCINATE) Take 1 tablet by mouth daily  #30 x 6   Entered by:   Judithe Modest CMA   Authorized by:   Laren Boom, MD, [facility]   Signed by:   Judithe Modest CMA on 09/11/2010   Method used:   Electronically to        [facility]  [phone]* (retail)       [address]       [place], [place]  [address]       Ph: [phone] or [phone]       Fax: [phone]   RxID:   [id number]

## 2010-09-24 NOTE — Progress Notes (Signed)
Summary: pt has med change question  Phone Note Call from Patient   Caller:  [phone] Patient Reason for Call: Talk to Nurse Summary of Call: pt picked up metoprolol and it's now on tier 3 with the insurance/if he can change to tartrate it would be cheaper, went ahead and picked this month's supply up, but if he can change would like refills changed Initial call taken by: Glynda Jaeger,  September 11, 2010 2:02 PM  Follow-up for Phone Call        ok to change to Metoprolol Amparo Bristol, RN, BSN  September 16, 2010 6:37 PM spoke with patient will change to Metoprolol Tarr 25mg  1/2 two times a day Pt aware Dennis Bast, RN, BSN  September 16, 2010 6:41 PM    New/Updated Medications: METOPROLOL TARTRATE 25 MG TABS (METOPROLOL TARTRATE) 1/2 tablet two times a day Prescriptions: METOPROLOL TARTRATE 25 MG TABS (METOPROLOL TARTRATE) 1/2 tablet two times a day  #30 x 6   Entered by:   Dennis Bast, RN, BSN   Authorized by:   Laren Boom, MD, [facility]   Signed by:   Dennis Bast, RN, BSN on 09/16/2010   Method used:   Electronically to        [facility]  [phone]* (retail)       [address]       [place], [place]  [address]       Ph: [phone] or [phone]       Fax: [phone]   RxID:   [id number]

## 2010-10-23 LAB — DIFFERENTIAL
Lymphs Abs: 2.9 10*3/uL (ref 0.7–4.0)
Monocytes Absolute: 0.6 10*3/uL (ref 0.1–1.0)
Monocytes Relative: 8 % (ref 3–12)
Neutro Abs: 4 10*3/uL (ref 1.7–7.7)
Neutrophils Relative %: 52 % (ref 43–77)

## 2010-10-23 LAB — CBC
HCT: 44.7 % (ref 39.0–52.0)
Hemoglobin: 16 g/dL (ref 13.0–17.0)
RBC: 4.87 MIL/uL (ref 4.22–5.81)

## 2010-10-23 LAB — POCT CARDIAC MARKERS
CKMB, poc: <1 ng/mL — ABNORMAL LOW (ref 1.0–8.0)
Myoglobin, poc: 62.8 ng/mL (ref 12–200)
Troponin i, poc: <0.05 ng/mL (ref 0.00–0.09)

## 2010-10-23 LAB — BASIC METABOLIC PANEL
BUN: 11 mg/dL (ref 6–23)
Creatinine, Ser: 1.11 mg/dL (ref 0.4–1.5)
GFR calc non Af Amer: >60 mL/min (ref >60)

## 2010-10-23 LAB — TSH: TSH: 3.337 u[IU]/mL (ref 0.350–4.500)

## 2010-12-11 ENCOUNTER — Encounter: Payer: Self-pay | Admitting: Family Medicine

## 2010-12-16 ENCOUNTER — Encounter: Payer: Self-pay | Admitting: Family Medicine

## 2010-12-16 ENCOUNTER — Ambulatory Visit (INDEPENDENT_AMBULATORY_CARE_PROVIDER_SITE_OTHER): Payer: Medicare Other | Admitting: Family Medicine

## 2010-12-16 VITALS — BP 110/72 | HR 72 | Temp 98.1°F | Resp 12 | Ht 68.0 in | Wt 171.0 lb

## 2010-12-16 DIAGNOSIS — Z23 Encounter for immunization: Secondary | ICD-10-CM

## 2010-12-16 DIAGNOSIS — Z125 Encounter for screening for malignant neoplasm of prostate: Secondary | ICD-10-CM

## 2010-12-16 DIAGNOSIS — R002 Palpitations: Secondary | ICD-10-CM

## 2010-12-16 DIAGNOSIS — R7309 Other abnormal glucose: Secondary | ICD-10-CM

## 2010-12-16 DIAGNOSIS — Z0000 Encounter for general adult medical examination without abnormal findings: Secondary | ICD-10-CM

## 2010-12-16 DIAGNOSIS — E785 Hyperlipidemia, unspecified: Secondary | ICD-10-CM

## 2010-12-16 DIAGNOSIS — R7303 Prediabetes: Secondary | ICD-10-CM

## 2010-12-16 LAB — BASIC METABOLIC PANEL
BUN: 23 mg/dL (ref 6–23)
Calcium: 9.2 mg/dL (ref 8.4–10.5)
Creatinine, Ser: 1.1 mg/dL (ref 0.4–1.5)
GFR: 67.7 mL/min (ref >60.00)

## 2010-12-16 LAB — HEPATIC FUNCTION PANEL
ALT: 21 U/L (ref 0–53)
AST: 25 U/L (ref 0–37)
Alkaline Phosphatase: 44 U/L (ref 39–117)
Bilirubin, Direct: 0.1 mg/dL (ref 0.0–0.3)
Total Protein: 7 g/dL (ref 6.0–8.3)

## 2010-12-16 LAB — LIPID PANEL
Cholesterol: 156 mg/dL (ref 0–200)
LDL Cholesterol: 89 mg/dL (ref 0–99)

## 2010-12-16 MED ORDER — METOPROLOL TARTRATE 25 MG PO TABS: 25.0000 mg | ORAL_TABLET | Freq: Two times a day (BID) | ORAL | Status: DC

## 2010-12-16 MED ORDER — TETANUS-DIPHTH-ACELL PERTUSSIS 5-2.5-18.5 LF-MCG/0.5 IM SUSP: 0.5000 mL | Freq: Once | INTRAMUSCULAR | Status: DC

## 2010-12-16 NOTE — Progress Notes (Signed)
Subjective:    Patient ID: Don Medina, male    DOB: 09-29-1940, 70 y.o.   MRN: [id number]  HPI Patient seen for Medicare wellness exam and followup medical problems. He had history of palpitations last year which have finally settled down and improved on Toprol-XL 25 mg. He continues to exercise regularly. No recent chest pains. Stress test last June was unremarkable.  He has history of hyperlipidemia treated with simvastatin 20 mg daily. Tolerating well no side effects. He does not have any history of CAD or peripheral vascular disease.  Pneumovax 2008. Last tetanus over 10 years ago. Colonoscopy 2008. No history of smoking. No regular alcohol use.  Past Medical History  Diagnosis Date  . Arthritis   . Blood in the stool   . Chicken pox   . Hay fever   . History of colonic polyps   . Hyperlipidemia    Past Surgical History  Procedure Date  . Herniated disk surgery c 6-7 1986  . Herniated diks surgery l-5 1996  . Abdominal adhesion surgery 2007    reports that he has never smoked. He does not have any smokeless tobacco history on file. His alcohol and drug histories not on file. family history includes Cancer in his brother and Sudden death in his other. No Known Allergies   1.  Risk factors based on Past Medical , Social, and Family history  medical history as above. Patient has no history smoking. No regular alcohol use. Retired age 66 2.  Limitations in physical activities quite active physically. Exercises daily. Very low risk for falls 3.  Depression/mood no history of recent depression or anxiety issues. 4.  Hearing no hearing deficits 5.  ADLs fully independent in all 6.  Cognitive function (orientation to time and place, language, writing, speech,memory) no impairment of short-term or long-term memory. Judgment intact. No apparent meds and written language or speech 7.  Home Safety no issues identified.  8.  Height, weight, and visual acuity. Height weight and  states these are stable. He gets yearly eye checkups 9.  Counseling patient needs tetanus booster. 10. Recommendation of preventive services.Tdap.  Colonoscopy up to date. Pneumovax up-to-date 11. Labs based on risk factors PSA, basic metabolic panel, lipid panel, and hepatic panel 12. Care Plan  immunizations up to date with the exception of tetanus. Continue regular exercise. Colonoscopy up to date.    Review of Systems  Constitutional: Negative for fever, activity change, appetite change and fatigue.  HENT: Negative for ear pain, congestion and trouble swallowing.   Eyes: Negative for pain and visual disturbance.  Respiratory: Negative for cough, shortness of breath and wheezing.   Cardiovascular: Negative for chest pain and palpitations.  Gastrointestinal: Negative for nausea, vomiting, abdominal pain, diarrhea, constipation, blood in stool, abdominal distention and rectal pain.  Genitourinary: Negative for dysuria, hematuria and testicular pain.  Musculoskeletal: Negative for joint swelling and arthralgias.  Skin: Negative for rash.  Neurological: Negative for dizziness, syncope and headaches.  Hematological: Negative for adenopathy.  Psychiatric/Behavioral: Negative for confusion and dysphoric mood.       Objective:   Physical Exam  Constitutional: He is oriented to person, place, and time. He appears well-developed and well-nourished. No distress.  HENT:  Head: Normocephalic and atraumatic.  Right Ear: External ear normal.  Left Ear: External ear normal.  Mouth/Throat: Oropharynx is clear and moist.  Eyes: Conjunctivae and EOM are normal. Pupils are equal, round, and reactive to light.  Neck: Normal range of motion. Neck  supple. No thyromegaly present.  Cardiovascular: Normal rate, regular rhythm and normal heart sounds.   No murmur heard. Pulmonary/Chest: No respiratory distress. He has no wheezes. He has no rales.  Abdominal: Soft. Bowel sounds are normal. He exhibits no  distension and no mass. There is no tenderness. There is no rebound and no guarding.  Genitourinary: Rectum normal and prostate normal.  Musculoskeletal: He exhibits no edema.  Lymphadenopathy:    He has no cervical adenopathy.  Neurological: He is alert and oriented to person, place, and time. He displays normal reflexes. No cranial nerve deficit.  Skin: No rash noted.  Psychiatric: He has a normal mood and affect.          Assessment & Plan:  #1 Medicare wellness exam. Tetanus booster given. Discussed exercise and diet. #2 history of palpitations. Refilled her Toprol all for one year #3 hyperlipidemia. Reassess lipids. Continue Zocor 20 mg daily

## 2010-12-17 NOTE — Progress Notes (Signed)
Quick Note:  Labs mailed to pt home ______

## 2010-12-25 NOTE — Op Note (Signed)
[facility]  Patient:    Don Medina, Don Medina                   MRN: [id number] Proc. Date: 12/29/00 Adm. Date:  [id number] Attending:  Ronne Binning                           Operative Report  PREOPERATIVE DIAGNOSIS:  Internal derangement left wrist.  POSTOPERATIVE DIAGNOSIS:  Internal derangement left wrist.  OPERATION:  Arthroscopy left wrist with debridement.  SURGEON:  Nicki Reaper, M.D.  ANESTHESIA:  Axillary block.  ANESTHESIOLOGIST:  Judie Petit, M.D.  DESCRIPTION OF PROCEDURE:  The patient was brought to the operating room where an axillary block was carried out without difficulty.  He was prepped and draped using Betadine scrub and solution with the left arm free.  The limb was placed on the arthroscopy table and positive traction applied.  The joint inflated at the 3-4 portal with 10 cc of saline.  The transverse incision was made and deepened with a hemostat.  A blunt trocar was used to enter the joint.  The scope was introduced.  The irrigation catheter was placed in situ. The scapholunate ligament showed a partial tear. The volar radial wrist ligaments were intact.  There was a mild amount of chondromalacia on the radial facet to the scaphoid.  The lunate facet was intact.  A lunotriquetral tear was immediately apparent.  A very significant synovitis was present.  The ulnar and volar ligaments were intact. The ACC appeared intact.  A 6-R portal was then opened at the localization with a 22 gauge needle.  A synovectomy was performed with an ArthroWand.  The scope was introduced in 6-R and the lunotriquetral tear was immediately apparent.  This was debrided with a shaver after placing the scope in 4-5 and the shaver in 6-R.  The mid carpal joint was then inspected and very significant loss of cartilage was present on the distal lunate.  The proximal capitate showed moderate changes of chondromalacia.  The scapholunate  and lunotriquetral tears were apparent.  The scope could not be driven through the scapholunate ligament.  A moderate synovitis was present throughout the joint. This was debrided with the ArthroWand and shaver.  The instruments were removed, the portals closed with interrupted 5-0 nylon suture.  Sterile compressive dressing and splint was applied.  The patient tolerated the procedure well and was taken to the recovery room for observation in satisfactory condition.  He is discharged home to return in the interim to [facility] in one week on Vicodin and Keflex. DD:  12/29/00 TD:  12/29/00 Job: [id number]

## 2010-12-30 ENCOUNTER — Encounter: Payer: Self-pay | Admitting: Internal Medicine

## 2011-01-05 ENCOUNTER — Encounter: Payer: Self-pay | Admitting: Internal Medicine

## 2011-01-05 ENCOUNTER — Ambulatory Visit (INDEPENDENT_AMBULATORY_CARE_PROVIDER_SITE_OTHER): Payer: Medicare Other | Admitting: Internal Medicine

## 2011-01-05 DIAGNOSIS — I4949 Other premature depolarization: Secondary | ICD-10-CM

## 2011-01-05 DIAGNOSIS — I493 Ventricular premature depolarization: Secondary | ICD-10-CM

## 2011-01-05 DIAGNOSIS — R42 Dizziness and giddiness: Secondary | ICD-10-CM

## 2011-01-05 HISTORY — DX: Ventricular premature depolarization: I49.3

## 2011-01-05 NOTE — Assessment & Plan Note (Signed)
His symptoms are improved but still present. I discussed the etiology with the patient. I do not think they are entirely due to his PVCs. I have offered him Florinef. Instead of this however, he wants to try to increase his sodium intake. We'll plan a period of watchful waiting.

## 2011-01-05 NOTE — Assessment & Plan Note (Signed)
He is currently asymptomatic. His EKG demonstrates no PVCs. We discussed the possibility of catheter ablation, but I have not recommended it at this time.

## 2011-01-05 NOTE — Progress Notes (Signed)
HPI Don Medina returns today for followup. He is a pleasant 70 year old man who is quite active, has a history of dizzy spells and orthostasis. He also has symptomatic PVCs and on cardiac monitoring over a year ago, he had 37,000 PVCs in a 24-hour period. He continues to have one or 2 episodes of dizziness a month. He has had no frank syncope. He does not feel palpitations. When he checks his blood pressure, his heart rate sometimes is quite low in the 35-40 beat per minute range. I suspect this is related to his PVCs. No frank syncope. No chest pain, or shortness of breath. He continues to work out vigorously. No Known Allergies   Current Outpatient Prescriptions  Medication Sig Dispense Refill  . aspirin 81 MG EC tablet Take 81 mg by mouth daily.        . Folic Acid POWD 800 mg by Does not apply route daily.        . Glucosamine-Chondroit-Biofl-Mn CAPS Take 1 capsule by mouth daily.        . metoprolol tartrate (LOPRESSOR) 25 MG tablet Take 1 tablet (25 mg total) by mouth 2 (two) times daily.  90 tablet  3  . Omega-3 Fatty Acids (FISH OIL) 1200 MG CAPS Take 1,200 mg by mouth 2 (two) times daily.        . simvastatin (ZOCOR) 20 MG tablet Take 20 mg by mouth daily.        [name] Kitchen DISCONTD: CALCIUM-VITAMIN D PO Take by mouth daily.         Current Facility-Administered Medications  Medication Dose Route Frequency Provider Last Rate Last Dose  . TDaP (BOOSTRIX) injection 0.5 mL  0.5 mL Intramuscular Once Kristian Covey         Past Medical History  Diagnosis Date  . Arthritis   . Blood in the stool   . Chicken pox   . Hay fever   . History of colonic polyps   . Hyperlipidemia   . History of colonoscopy     ROS:   All systems reviewed and negative except as noted in the HPI.   Past Surgical History  Procedure Date  . Herniated disk surgery c 6-7 1986  . Herniated diks surgery l-5 1996  . Abdominal adhesion surgery 2007     Family History  Problem Relation Age of Onset  .  Cancer Brother     Leukemia  . Sudden death Other     family  . Hypertension       History   Social History  . Marital Status: Married    Spouse Name: N/A    Number of Children: N/A  . Years of Education: N/A   Occupational History  . Retired    Social History Main Topics  . Smoking status: Former Smoker    Quit date: 08/09/1974  . Smokeless tobacco: Not on file  . Alcohol Use: Not on file  . Drug Use: Not on file  . Sexually Active: Not on file   Other Topics Concern  . Not on file   Social History Narrative  . No narrative on file     BP 112/80  Pulse 57  Resp 16  Ht 5\' 8"  (1.727 m)  Wt 170 lb (77.111 kg)  BMI 25.85 kg/m2  Physical Exam:  Well appearing NAD HEENT: Unremarkable Neck:  No JVD, no thyromegally Lymphatics:  No adenopathy Back:  No CVA tenderness Lungs:  Clear HEART:  Regular rate rhythm, no murmurs,  no rubs, no clicks Abd:  Flat, positive bowel sounds, no organomegally, no rebound, no guarding Ext:  2 plus pulses, no edema, no cyanosis, no clubbing Skin:  No rashes no nodules Neuro:  CN II through XII intact, motor grossly intact  EKG Sinus bradycardia. Left atrial enlargement.  Assess/Plan:

## 2011-02-12 ENCOUNTER — Encounter: Payer: Self-pay | Admitting: Family Medicine

## 2011-02-12 ENCOUNTER — Ambulatory Visit (INDEPENDENT_AMBULATORY_CARE_PROVIDER_SITE_OTHER): Payer: Medicare Other | Admitting: Family Medicine

## 2011-02-12 VITALS — BP 118/70 | Temp 98.3°F | Wt 172.0 lb

## 2011-02-12 DIAGNOSIS — J45909 Unspecified asthma, uncomplicated: Secondary | ICD-10-CM

## 2011-02-12 MED ORDER — PREDNISONE 20 MG PO TABS: ORAL_TABLET | ORAL | Status: DC

## 2011-02-12 NOTE — Progress Notes (Signed)
  Subjective:    Patient ID: Don Medina, male    DOB: 23-Dec-1940, 70 y.o.   MRN: [id number]  HPI Nonsmoker five-day history of cough mostly productive of clear mucous occasionally yellow tinged. Feels some wheezing off and on especially at night. Not currently exercising. No dyspnea at rest. Denies fever. No recent nasal congestion symptoms. Possible exposure to viral illness with granddaughter. Denies any chest pain. No history of asthma   Review of Systems  Constitutional: Negative for fever, chills, activity change, appetite change, fatigue and unexpected weight change.  HENT: Negative for ear pain, congestion, sore throat and trouble swallowing.   Respiratory: Positive for cough and wheezing. Negative for shortness of breath and stridor.   Cardiovascular: Negative for chest pain and leg swelling.  Gastrointestinal: Negative for abdominal pain.  Musculoskeletal: Negative for arthralgias.  Skin: Negative for rash.  Neurological: Negative for syncope and headaches.  Hematological: Negative for adenopathy.       Objective:   Physical Exam  Constitutional: He appears well-developed and well-nourished.  Cardiovascular: Normal rate, regular rhythm and normal heart sounds.   Pulmonary/Chest:       Patient has some diffuse wheezes.  No retractions.  No rales.  Musculoskeletal: He exhibits no edema.          Assessment & Plan:  Asthmatic bronchitis. Suspect viral. Prednisone 20 mg 2 tablets daily for 5 days. Follow up promptly for fever or worsening symptoms.

## 2011-02-12 NOTE — Patient Instructions (Signed)
Follow up promptly for any fever or worsening symptoms 

## 2011-02-19 ENCOUNTER — Telehealth: Payer: Self-pay | Admitting: *Deleted

## 2011-02-19 NOTE — Telephone Encounter (Signed)
Prednisone cured pt's cough, and he feels so much better.

## 2011-03-22 ENCOUNTER — Encounter: Payer: Self-pay | Admitting: Internal Medicine

## 2011-07-06 ENCOUNTER — Encounter: Payer: Self-pay | Admitting: Internal Medicine

## 2011-07-06 ENCOUNTER — Ambulatory Visit (INDEPENDENT_AMBULATORY_CARE_PROVIDER_SITE_OTHER): Payer: Medicare Other | Admitting: Internal Medicine

## 2011-07-06 VITALS — BP 118/72 | HR 59 | Ht 68.0 in | Wt 179.4 lb

## 2011-07-06 DIAGNOSIS — I4949 Other premature depolarization: Secondary | ICD-10-CM

## 2011-07-06 DIAGNOSIS — I493 Ventricular premature depolarization: Secondary | ICD-10-CM

## 2011-07-06 NOTE — Progress Notes (Signed)
HPI Don Medina returns today for followup. He is a very pleasant 70 year old man with a history of orthostatic hypotension, asymptomatic PVCs, and arthritis. The patient denies chest pain, shortness of breath, or peripheral edema. He has had no syncope. He still notes that about once a week when he gets up particularly from a reclining chair, but he will have an episode of near syncope. After several seconds, the episode will resolve. He has tried to increase his salt and fluid intake though he does not think this has improved his symptoms. He does not feel palpitations or any symptoms from his PVCs. No Known Allergies   Current Outpatient Prescriptions  Medication Sig Dispense Refill  . aspirin 81 MG EC tablet Take 81 mg by mouth daily.        . folic acid (FOLVITE) 1 MG tablet Take 1 mg by mouth daily.        . Glucosamine-Chondroit-Biofl-Mn CAPS Take 1 capsule by mouth daily.        . metoprolol tartrate (LOPRESSOR) 25 MG tablet Take 1 tablet (25 mg total) by mouth 2 (two) times daily.  90 tablet  3  . Omega-3 Fatty Acids (FISH OIL) 1200 MG CAPS Take 1,200 mg by mouth 2 (two) times daily.        . simvastatin (ZOCOR) 20 MG tablet Take 20 mg by mouth daily.         Current Facility-Administered Medications  Medication Dose Route Frequency Provider Last Rate Last Dose  . TDaP (BOOSTRIX) injection 0.5 mL  0.5 mL Intramuscular Once Kristian Covey         Past Medical History  Diagnosis Date  . Arthritis   . Blood in the stool   . Chicken pox   . Hay fever   . History of colonic polyps   . Hyperlipidemia   . History of colonoscopy     ROS:   All systems reviewed and negative except as noted in the HPI.   Past Surgical History  Procedure Date  . Herniated disk surgery c 6-7 1986  . Herniated diks surgery l-5 1996  . Abdominal adhesion surgery 2007     Family History  Problem Relation Age of Onset  . Cancer Brother     Leukemia  . Sudden death Other     family  .  Hypertension    . Alzheimer's disease Father 19  . Heart disease Neg Hx   . Stroke Mother 57     History   Social History  . Marital Status: Married    Spouse Name: N/A    Number of Children: N/A  . Years of Education: N/A   Occupational History  . Retired    Social History Main Topics  . Smoking status: Former Smoker    Quit date: 08/09/1974  . Smokeless tobacco: Not on file  . Alcohol Use: No  . Drug Use: No  . Sexually Active: Not on file   Other Topics Concern  . Not on file   Social History Narrative  . No narrative on file     BP 118/72  Pulse 59  Ht 5\' 8"  (1.727 m)  Wt 81.375 kg (179 lb 6.4 oz)  BMI 27.28 kg/m2  Physical Exam:  Well appearing middle-age man, NAD HEENT: Unremarkable Neck:  No JVD, no thyromegally Lymphatics:  No adenopathy Back:  No CVA tenderness Lungs:  Clear with no wheezes, rales, or rhonchi. HEART:  Regular rate rhythm, no murmurs, no rubs, no  clicks Abd:  soft, positive bowel sounds, no organomegally, no rebound, no guarding Ext:  2 plus pulses, no edema, no cyanosis, no clubbing Skin:  No rashes no nodules Neuro:  CN II through XII intact, motor grossly intact  EKG Normal sinus rhythm with left atrial enlargement  Assess/Plan:

## 2011-07-06 NOTE — Patient Instructions (Signed)
Your physician wants you to follow-up in: 12 months with Dr. Taylor. You will receive a reminder letter in the mail two months in advance. If you don't receive a letter, please call our office to schedule the follow-up appointment.    

## 2011-07-06 NOTE — Assessment & Plan Note (Signed)
The patient had less than 10,000 PVCs in a 24-hour period on Holter monitoring. He does not feel his symptoms. I have recommended that he continue his current medical therapy with low-dose beta blocker. The relative benign nature of his situation has been discussed as well.

## 2011-08-11 ENCOUNTER — Ambulatory Visit (INDEPENDENT_AMBULATORY_CARE_PROVIDER_SITE_OTHER): Payer: Medicare Other | Admitting: Family

## 2011-08-11 ENCOUNTER — Encounter: Payer: Self-pay | Admitting: Family

## 2011-08-11 VITALS — BP 124/80 | HR 71 | Temp 98.2°F | Wt 183.0 lb

## 2011-08-11 DIAGNOSIS — L239 Allergic contact dermatitis, unspecified cause: Secondary | ICD-10-CM

## 2011-08-11 DIAGNOSIS — L282 Other prurigo: Secondary | ICD-10-CM

## 2011-08-11 DIAGNOSIS — L259 Unspecified contact dermatitis, unspecified cause: Secondary | ICD-10-CM

## 2011-08-11 MED ORDER — METHYLPREDNISOLONE ACETATE 80 MG/ML IJ SUSP
80.0000 mg | Freq: Once | INTRAMUSCULAR | Status: AC
  Administered 2011-08-11: 80 mg via INTRAMUSCULAR

## 2011-08-11 MED ORDER — PREDNISONE 20 MG PO TABS: 20.0000 mg | ORAL_TABLET | Freq: Every day | ORAL | Status: AC

## 2011-08-11 NOTE — Progress Notes (Signed)
Subjective:    Patient ID: Don Medina, male    DOB: Jan 31, 1941, 71 y.o.   MRN: [id number]  HPI 71 year old white male, former smoker, patient of Dr. Caryl Never is in today with a rash all over his body present for about a week. Reports itching. Has been using hydrocortisone cream that helps but has been having difficult to control in the rash. Denies any changes in detergents, soaps and,a  lotions.   Review of Systems  HENT: Negative.   Eyes: Negative.   Respiratory: Negative.   Cardiovascular: Negative.   Musculoskeletal: Negative.   Skin:       Generalized, itchy, red skin rash  Neurological: Negative.   Hematological: Negative.        Past Medical History  Diagnosis Date  . Arthritis   . Blood in the stool   . Chicken pox   . Hay fever   . History of colonic polyps   . Hyperlipidemia   . History of colonoscopy     History   Social History  . Marital Status: Married    Spouse Name: N/A    Number of Children: N/A  . Years of Education: N/A   Occupational History  . Retired    Social History Main Topics  . Smoking status: Former Smoker    Quit date: 08/09/1974  . Smokeless tobacco: Not on file  . Alcohol Use: No  . Drug Use: No  . Sexually Active: Not on file   Other Topics Concern  . Not on file   Social History Narrative  . No narrative on file    Past Surgical History  Procedure Date  . Herniated disk surgery c 6-7 1986  . Herniated diks surgery l-5 1996  . Abdominal adhesion surgery 2007    Family History  Problem Relation Age of Onset  . Cancer Brother     Leukemia  . Sudden death Other     family  . Hypertension    . Alzheimer's disease Father 31  . Heart disease Neg Hx   . Stroke Mother 85    No Known Allergies  Current Outpatient Prescriptions on File Prior to Visit  Medication Sig Dispense Refill  . aspirin 81 MG EC tablet Take 81 mg by mouth daily.        . folic acid (FOLVITE) 1 MG tablet Take 1 mg by mouth daily.         . Glucosamine-Chondroit-Biofl-Mn CAPS Take 1 capsule by mouth daily.        . metoprolol tartrate (LOPRESSOR) 25 MG tablet Take 1 tablet (25 mg total) by mouth 2 (two) times daily.  90 tablet  3  . Omega-3 Fatty Acids (FISH OIL) 1200 MG CAPS Take 1,200 mg by mouth 2 (two) times daily.        . simvastatin (ZOCOR) 20 MG tablet Take 20 mg by mouth daily.         Current Facility-Administered Medications on File Prior to Visit  Medication Dose Route Frequency Provider Last Rate Last Dose  . TDaP (BOOSTRIX) injection 0.5 mL  0.5 mL Intramuscular Once Bruce W Burchette        BP 124/80  Pulse 71  Temp(Src) 98.2 F (36.8 C) (Oral)  Wt 183 lb (83.008 kg)chart Objective:   Physical Exam  Constitutional: He is oriented to person, place, and time.  Cardiovascular: Normal rate, regular rhythm and normal heart sounds.   Pulmonary/Chest: Effort normal and breath sounds normal.  Musculoskeletal: Normal range  of motion.  Neurological: He is alert and oriented to person, place, and time.  Skin: Skin is warm and dry. Rash noted.       Generalized red, well-appearing rash noted to the entire body except the genital and buttocks area. No drainage or signs of infection. Rash appears allergic.          Assessment & Plan:  Assessment: Allergic, contact dermatitis  Plan: Depo-Medrol 80 mg IM x1. Followup with prednisone 20 mg by mouth every morning x5 days. Patient called her symptoms worsen or persist. Recheck as scheduled and when necessary.

## 2011-08-11 NOTE — Patient Instructions (Signed)
Contact Dermatitis Contact dermatitis is a reaction to certain substances that touch the skin. Contact dermatitis can be either irritant contact dermatitis or allergic contact dermatitis. Irritant contact dermatitis does not require previous exposure to the substance for a reaction to occur. Allergic contact dermatitis only occurs if you have been exposed to the substance before. Upon a repeat exposure, your body reacts to the substance.   CAUSES   Many substances can cause contact dermatitis. Irritant dermatitis is most commonly caused by repeated exposure to mildly irritating substances, such as:  Makeup.     Soaps.    Detergents.    Bleaches.    Acids.    Metal salts, such as nickel.  Allergic contact dermatitis is most commonly caused by exposure to:  Poisonous plants.     Chemicals (deodorants, shampoos).     Jewelry.    Latex.    Neomycin in triple antibiotic cream.     Preservatives in products, including clothing.  SYMPTOMS   The area of skin that is exposed may develop:  Dryness or flaking.     Redness.    Cracks.    Itching.    Pain or a burning sensation.     Blisters.  With allergic contact dermatitis, there may also be swelling in areas such as the eyelids, mouth, or genitals.   DIAGNOSIS   Your caregiver can usually tell what the problem is by doing a physical exam. In cases where the cause is uncertain and an allergic contact dermatitis is suspected, a patch skin test may be performed to help determine the cause of your dermatitis. TREATMENT Treatment includes protecting the skin from further contact with the irritating substance by avoiding that substance if possible. Barrier creams, powders, and gloves may be helpful. Your caregiver may also recommend:  Steroid creams or ointments applied 2 times daily. For best results, soak the rash area in cool water for 20 minutes. Then apply the medicine. Cover the area with a plastic wrap. You can store the  steroid cream in the refrigerator for a "chilly" effect on your rash. That may decrease itching. Oral steroid medicines may be needed in more severe cases.     Antibiotics or antibacterial ointments if a skin infection is present.     Antihistamine lotion or an antihistamine taken by mouth to ease itching.     Lubricants to keep moisture in your skin.     Burow's solution to reduce redness and soreness or to dry a weeping rash. Mix one packet or tablet of solution in 2 cups cool water. Dip a clean washcloth in the mixture, wring it out a bit, and put it on the affected area. Leave the cloth in place for 30 minutes. Do this as often as possible throughout the day.     Taking several cornstarch or baking soda baths daily if the area is too large to cover with a washcloth.  Harsh chemicals, such as alkalis or acids, can cause skin damage that is like a burn. You should flush your skin for 15 to 20 minutes with cold water after such an exposure. You should also seek immediate medical care after exposure. Bandages (dressings), antibiotics, and pain medicine may be needed for severely irritated skin.   HOME CARE INSTRUCTIONS  Avoid the substance that caused your reaction.     Keep the area of skin that is affected away from hot water, soap, sunlight, chemicals, acidic substances, or anything else that would irritate your skin.       Do not scratch the rash. Scratching may cause the rash to become infected.     You may take cool baths to help stop the itching.     Only take over-the-counter or prescription medicines as directed by your caregiver.     See your caregiver for follow-up care as directed to make sure your skin is healing properly.  SEEK MEDICAL CARE IF:    Your condition is not better after 3 days of treatment.     You seem to be getting worse.     You see signs of infection such as swelling, tenderness, redness, soreness, or warmth in the affected area.     You have any problems  related to your medicines.  Document Released: 07/23/2000 Document Revised: 04/07/2011 Document Reviewed: 12/29/2010 [employer] Patient Information 2012 [employer]. 

## 2011-08-17 ENCOUNTER — Telehealth: Payer: Self-pay | Admitting: Family

## 2011-08-17 NOTE — Telephone Encounter (Signed)
Pt is call to let NP know the treatment she prescribed is working.

## 2011-10-02 ENCOUNTER — Other Ambulatory Visit: Payer: Self-pay | Admitting: Internal Medicine

## 2012-08-23 ENCOUNTER — Other Ambulatory Visit (INDEPENDENT_AMBULATORY_CARE_PROVIDER_SITE_OTHER): Payer: Medicare Other

## 2012-08-23 DIAGNOSIS — Z125 Encounter for screening for malignant neoplasm of prostate: Secondary | ICD-10-CM

## 2012-08-23 DIAGNOSIS — E785 Hyperlipidemia, unspecified: Secondary | ICD-10-CM

## 2012-08-23 DIAGNOSIS — Z0000 Encounter for general adult medical examination without abnormal findings: Secondary | ICD-10-CM

## 2012-08-23 DIAGNOSIS — R7309 Other abnormal glucose: Secondary | ICD-10-CM

## 2012-08-23 DIAGNOSIS — I4949 Other premature depolarization: Secondary | ICD-10-CM

## 2012-08-23 LAB — PSA: PSA: 1.49 ng/mL (ref 0.10–4.00)

## 2012-08-23 LAB — POCT URINALYSIS DIPSTICK
Glucose, UA: NEGATIVE
Leukocytes, UA: NEGATIVE
Nitrite, UA: NEGATIVE
Urobilinogen, UA: 0.2

## 2012-08-23 LAB — CBC WITH DIFFERENTIAL/PLATELET
Basophils Absolute: 0 10*3/uL (ref 0.0–0.1)
HCT: 44.9 % (ref 39.0–52.0)
Lymphs Abs: 3.1 10*3/uL (ref 0.7–4.0)
Monocytes Absolute: 0.7 10*3/uL (ref 0.1–1.0)
Monocytes Relative: 10.2 % (ref 3.0–12.0)
Platelets: 173 10*3/uL (ref 150.0–400.0)
RDW: 13.6 % (ref 11.5–14.6)

## 2012-08-23 LAB — BASIC METABOLIC PANEL
BUN: 24 mg/dL — ABNORMAL HIGH (ref 6–23)
Creatinine, Ser: 1 mg/dL (ref 0.4–1.5)
GFR: 75.74 mL/min (ref >60.00)
Glucose, Bld: 103 mg/dL — ABNORMAL HIGH (ref 70–99)
Potassium: 5.2 mEq/L — ABNORMAL HIGH (ref 3.5–5.1)

## 2012-08-23 LAB — LIPID PANEL
Cholesterol: 144 mg/dL (ref 0–200)
Triglycerides: 51 mg/dL (ref 0.0–149.0)

## 2012-08-23 LAB — HEPATIC FUNCTION PANEL: Total Bilirubin: 1.1 mg/dL (ref 0.3–1.2)

## 2012-08-23 LAB — TSH: TSH: 2.01 u[IU]/mL (ref 0.35–5.50)

## 2012-08-23 NOTE — Addendum Note (Signed)
Addended by: Bonnye Fava on: 08/23/2012 09:38 AM   Modules accepted: Orders

## 2012-08-30 ENCOUNTER — Ambulatory Visit (INDEPENDENT_AMBULATORY_CARE_PROVIDER_SITE_OTHER): Payer: Medicare Other | Admitting: Family Medicine

## 2012-08-30 ENCOUNTER — Encounter: Payer: Self-pay | Admitting: Family Medicine

## 2012-08-30 VITALS — BP 138/68 | HR 60 | Temp 97.7°F | Resp 12 | Ht 68.0 in | Wt 179.0 lb

## 2012-08-30 DIAGNOSIS — Z0000 Encounter for general adult medical examination without abnormal findings: Secondary | ICD-10-CM

## 2012-08-30 MED ORDER — SIMVASTATIN 20 MG PO TABS: 20.0000 mg | ORAL_TABLET | Freq: Every day | ORAL | Status: DC

## 2012-08-30 NOTE — Progress Notes (Signed)
Subjective:    Patient ID: Don Medina, male    DOB: 1941/04/10, 72 y.o.   MRN: [id number]  HPI Patient here for complete physical. He has history of hyperlipidemia, history of PVCs, past history of syncope, and prediabetes. He exercises 6 days per week. Currently takes simvastatin 20 mg daily and aspirin 81 mg daily. He stopped taking the metoprolol during the past year and has not seen any increase in palpitations as had no recent syncope. His been conscious to hydrate well. No chest pains. Immunizations up to date. Colonoscopy up to date. Nonsmoker.  Past Medical History  Diagnosis Date  . Arthritis   . Blood in the stool   . Chicken pox   . Hay fever   . History of colonic polyps   . Hyperlipidemia   . History of colonoscopy    Past Surgical History  Procedure Date  . Herniated disk surgery c 6-7 1986  . Herniated diks surgery l-5 1996  . Abdominal adhesion surgery 2007    reports that he quit smoking about 38 years ago. He does not have any smokeless tobacco history on file. He reports that he does not drink alcohol or use illicit drugs. family history includes Alzheimer's disease (age of onset:81) in his father; Cancer in his brother; Hypertension in an unspecified family member; Stroke (age of onset:82) in his mother; and Sudden death in his other.  There is no history of Heart disease. No Known Allergies    Review of Systems  Constitutional: Negative for fever, activity change, appetite change, fatigue and unexpected weight change.  HENT: Negative for ear pain, congestion and trouble swallowing.   Eyes: Negative for pain and visual disturbance.  Respiratory: Negative for cough, shortness of breath and wheezing.   Cardiovascular: Negative for chest pain and palpitations.  Gastrointestinal: Negative for nausea, vomiting, abdominal pain, diarrhea, constipation, blood in stool, abdominal distention and rectal pain.  Genitourinary: Negative for dysuria, hematuria and  testicular pain.  Musculoskeletal: Negative for joint swelling and arthralgias.  Skin: Negative for rash.  Neurological: Negative for dizziness, syncope and headaches.  Hematological: Negative for adenopathy.  Psychiatric/Behavioral: Negative for confusion and dysphoric mood.       Objective:   Physical Exam  Constitutional: He is oriented to person, place, and time. He appears well-developed and well-nourished. No distress.  HENT:  Head: Normocephalic and atraumatic.  Right Ear: External ear normal.  Left Ear: External ear normal.  Mouth/Throat: Oropharynx is clear and moist.  Eyes: Conjunctivae normal and EOM are normal. Pupils are equal, round, and reactive to light.  Neck: Normal range of motion. Neck supple. No thyromegaly present.  Cardiovascular: Normal rate, regular rhythm and normal heart sounds.   No murmur heard. Pulmonary/Chest: No respiratory distress. He has no wheezes. He has no rales.  Abdominal: Soft. Bowel sounds are normal. He exhibits no distension and no mass. There is no tenderness. There is no rebound and no guarding.  Musculoskeletal: He exhibits no edema.  Lymphadenopathy:    He has no cervical adenopathy.  Neurological: He is alert and oriented to person, place, and time. He displays normal reflexes. No cranial nerve deficit.  Skin: No rash noted.       No concerning skin lesions  Psychiatric: He has a normal mood and affect.          Assessment & Plan:  Complete physical. Immunizations up to date. Labs reviewed with patient. He has minimally elevated potassium 5.2 which is probably secondary to hemolysis.  He takes no potassium supplementation. Glucose 103 which is stable. Refill simvastatin for one year. Continue regular exercise habits. Continue yearly flu vaccine.

## 2012-08-30 NOTE — Patient Instructions (Addendum)
Continue yearly flu vaccine 

## 2012-09-12 ENCOUNTER — Encounter: Payer: Self-pay | Admitting: Family Medicine

## 2012-09-13 ENCOUNTER — Telehealth: Payer: Self-pay | Admitting: Family Medicine

## 2012-09-13 NOTE — Telephone Encounter (Signed)
Don Medina/Dr. Caryl Never -  This patient had a pneumovax on 08/09/2006. He was 5 months shy of being 66. He wants to know if he needs another pneumovax, or if he is ok with just the once vaccine. Please advise, and I will respond to him via My Chart. Thanks.

## 2012-09-27 ENCOUNTER — Encounter: Payer: Self-pay | Admitting: Family Medicine

## 2012-10-01 ENCOUNTER — Encounter: Payer: Self-pay | Admitting: Family Medicine

## 2012-10-03 ENCOUNTER — Ambulatory Visit: Payer: Medicare Other | Admitting: Family Medicine

## 2012-10-03 ENCOUNTER — Ambulatory Visit (INDEPENDENT_AMBULATORY_CARE_PROVIDER_SITE_OTHER): Payer: Medicare Other | Admitting: Family Medicine

## 2012-10-03 ENCOUNTER — Encounter: Payer: Self-pay | Admitting: Family Medicine

## 2012-10-03 VITALS — BP 118/80 | Temp 97.7°F | Wt 178.0 lb

## 2012-10-03 DIAGNOSIS — K219 Gastro-esophageal reflux disease without esophagitis: Secondary | ICD-10-CM

## 2012-10-03 DIAGNOSIS — Z23 Encounter for immunization: Secondary | ICD-10-CM

## 2012-10-03 DIAGNOSIS — R079 Chest pain, unspecified: Secondary | ICD-10-CM

## 2012-10-03 HISTORY — DX: Gastro-esophageal reflux disease without esophagitis: K21.9

## 2012-10-03 NOTE — Progress Notes (Signed)
  Subjective:    Patient ID: Don Medina, male    DOB: 12-Aug-1940, 72 y.o.   MRN: [id number]  HPI   Chest pain  Onset about one year ago-intermittent. 4-5 episodes past month Occur at rest and NEVER with exercise. Location lower substernal. Quality is burning.  Duration minutes to hours. No radiation.   Alleviating: Pepcid except for episode on 2/19.  Better upright Worse lying flat.  No caffeine,  No relation to foods.    No dysphagia or odynophagia.  No appetite or weight change. No history of right upper quadrant pain. No dyspnea. No chest pains  Patient had extensive cardiac workup about 2 years ago for some palpitations and presyncopal type symptoms. Stress testing then normal. Overall low risk for CAD  Past Medical History  Diagnosis Date  . Arthritis   . Blood in the stool   . Chicken pox   . Hay fever   . History of colonic polyps   . Hyperlipidemia   . History of colonoscopy    Past Surgical History  Procedure Laterality Date  . Herniated disk surgery c 6-7  1986  . Herniated diks surgery l-5  1996  . Abdominal adhesion surgery  2007    reports that he quit smoking about 38 years ago. He does not have any smokeless tobacco history on file. He reports that he does not drink alcohol or use illicit drugs. family history includes Alzheimer's disease (age of onset: 44) in his father; Cancer in his brother; Hypertension in an unspecified family member; Stroke (age of onset: 68) in his mother; and Sudden death in his other.  There is no history of Heart disease. No Known Allergies    Review of Systems  Constitutional: Negative for appetite change and unexpected weight change.  HENT: Negative for sore throat, trouble swallowing and voice change.   Respiratory: Negative for cough and shortness of breath.   Cardiovascular: Negative for chest pain, palpitations and leg swelling.  Gastrointestinal: Negative for nausea, vomiting, diarrhea, blood in stool and  abdominal distention.       Objective:   Physical Exam  Constitutional: He appears well-developed and well-nourished.  HENT:  Mouth/Throat: Oropharynx is clear and moist.  Neck: Neck supple. No thyromegaly present.  Cardiovascular: Normal rate and regular rhythm.   Pulmonary/Chest: Effort normal and breath sounds normal. No respiratory distress. He has no wheezes. He has no rales.  Abdominal: Soft. Bowel sounds are normal. He exhibits no distension and no mass. There is no tenderness. There is no rebound and no guarding.  Lymphadenopathy:    He has no cervical adenopathy.          Assessment & Plan:  Mid lower substernal discomfort. Suspect this represents GERD. He exercises regularly with NO symptoms whatsoever. His symptoms are fairly classic for heartburn. He does not have any red flags such as appetite or weight change or dysphagia. Dietary recommendations given. Continue H2 blocker as needed. Consider PPI if symptoms become more prevalent at this point he wishes to wait  Pneumovax booster given as he had one prior to age 60 and a least 5 year interval since last

## 2012-10-03 NOTE — Patient Instructions (Addendum)

## 2012-10-11 ENCOUNTER — Telehealth: Payer: Self-pay | Admitting: Internal Medicine

## 2012-10-11 NOTE — Telephone Encounter (Signed)
Will forward to [employer].

## 2012-10-11 NOTE — Telephone Encounter (Signed)
New Prob    Pt experiencing chest pain at rest. Off and on for a week now. Never when active. Would like to see Dr. Ladona Ridgel today.

## 2012-10-11 NOTE — Telephone Encounter (Signed)
He has still not gone and gotten the OTC medication for reflux, he has stopped the Metoprolol last summer and feels better off of this.  He called today with such urgency because he is going on vacation next week.  These symptoms have been going on for several weeks

## 2012-10-11 NOTE — Telephone Encounter (Signed)
Chest pain only at rest, never when he is active. It last 2-3 hours no other symptoms associated with his pain.  I have asked that he try some sort of antiacid pill for reflux to see if this helps his symptoms.  He is going to try something OTC

## 2012-10-12 NOTE — Telephone Encounter (Signed)
Discussed with Don Medina today and he agrees with plan.  Patient will call me back if needed.  If OTC antiacids help he will call his PCP

## 2012-11-07 ENCOUNTER — Encounter: Payer: Self-pay | Admitting: Family Medicine

## 2012-11-08 ENCOUNTER — Encounter: Payer: Self-pay | Admitting: Family Medicine

## 2012-11-08 MED ORDER — OMEPRAZOLE 20 MG PO CPDR: 20.0000 mg | DELAYED_RELEASE_CAPSULE | Freq: Every day | ORAL | Status: DC

## 2012-11-09 MED ORDER — OMEPRAZOLE 20 MG PO CPDR: 20.0000 mg | DELAYED_RELEASE_CAPSULE | Freq: Every day | ORAL | Status: DC

## 2013-05-13 ENCOUNTER — Encounter: Payer: Self-pay | Admitting: Family Medicine

## 2013-05-14 ENCOUNTER — Telehealth: Payer: Self-pay

## 2013-05-14 NOTE — Telephone Encounter (Signed)
Sent patient a message through mychart

## 2013-05-14 NOTE — Telephone Encounter (Signed)
A couple of years ago you prescribed an antidepressant for me (Sertraline 50mg ), however I stopped taking it shortly after starting. I once again have a need for an antidepressant. My wife has been taking Citalopram (prescribed by Dr. Herb Grays), and reports that it has made a significant improvement for her. Would like for you to call in a 90 prescription to [facility] for me.  Patient was last seen 09/2012

## 2013-05-14 NOTE — Telephone Encounter (Signed)
I recommend follow up this week so we can discuss treatment options.

## 2013-05-15 ENCOUNTER — Encounter: Payer: Self-pay | Admitting: Family Medicine

## 2013-05-15 ENCOUNTER — Ambulatory Visit (INDEPENDENT_AMBULATORY_CARE_PROVIDER_SITE_OTHER): Payer: Medicare Other | Admitting: Family Medicine

## 2013-05-15 VITALS — BP 134/74 | HR 69 | Temp 97.8°F | Wt 173.0 lb

## 2013-05-15 DIAGNOSIS — F411 Generalized anxiety disorder: Secondary | ICD-10-CM

## 2013-05-15 MED ORDER — CITALOPRAM HYDROBROMIDE 20 MG PO TABS: 20.0000 mg | ORAL_TABLET | Freq: Every day | ORAL | Status: DC

## 2013-05-15 NOTE — Progress Notes (Signed)
  Subjective:    Patient ID: Don Medina, male    DOB: 13-Jan-1941, 72 y.o.   MRN: [id number]  HPI  Patient here to discuss some anxiety issues. He has daughter who is an alcoholic who lives in [place]. She has recently relapsed and this is caused great anxiety. He has a difficult sleeping. He still exercising regularly. Previously took sertraline but was concerned he may have had some side effects with that. He had some atypical symptoms that we did not feel were related.  He specifically inquires about Celexa. He has occasional depressed mood but mostly anxious.  Past Medical History  Diagnosis Date  . Arthritis   . Blood in the stool   . Chicken pox   . Hay fever   . History of colonic polyps   . Hyperlipidemia   . History of colonoscopy    Past Surgical History  Procedure Laterality Date  . Herniated disk surgery c 6-7  1986  . Herniated diks surgery l-5  1996  . Abdominal adhesion surgery  2007    reports that he quit smoking about 38 years ago. He does not have any smokeless tobacco history on file. He reports that he does not drink alcohol or use illicit drugs. family history includes Alzheimer's disease (age of onset: 22) in his father; Cancer in his brother; Hypertension in an other family member; Stroke (age of onset: 26) in his mother; Sudden death in his other. There is no history of Heart disease. No Known Allergies   Review of Systems  Constitutional: Negative for appetite change and unexpected weight change.  Cardiovascular: Negative for chest pain.  Psychiatric/Behavioral: Positive for sleep disturbance, dysphoric mood and decreased concentration. Negative for suicidal ideas, confusion and agitation. The patient is nervous/anxious.        Objective:   Physical Exam  Constitutional: He appears well-developed and well-nourished.  Cardiovascular: Normal rate and regular rhythm.   Pulmonary/Chest: Effort normal and breath sounds normal. No respiratory  distress. He has no wheezes. He has no rales.  Psychiatric: He has a normal mood and affect. His behavior is normal. Judgment and thought content normal.          Assessment & Plan:  Anxiety. Mostly situational. We discussed counseling which he's had in the past is not interested in pursuing. citalopram 20 mg 1 daily. Touch base in 3-4 weeks

## 2013-06-14 ENCOUNTER — Other Ambulatory Visit: Payer: Self-pay

## 2013-06-25 ENCOUNTER — Encounter: Payer: Self-pay | Admitting: Family Medicine

## 2013-08-23 ENCOUNTER — Other Ambulatory Visit (INDEPENDENT_AMBULATORY_CARE_PROVIDER_SITE_OTHER): Payer: Managed Care, Other (non HMO)

## 2013-08-23 DIAGNOSIS — Z0000 Encounter for general adult medical examination without abnormal findings: Secondary | ICD-10-CM

## 2013-08-23 DIAGNOSIS — Z125 Encounter for screening for malignant neoplasm of prostate: Secondary | ICD-10-CM

## 2013-08-23 DIAGNOSIS — E785 Hyperlipidemia, unspecified: Secondary | ICD-10-CM

## 2013-08-23 LAB — POCT URINALYSIS DIPSTICK
Bilirubin, UA: NEGATIVE
Blood, UA: NEGATIVE
Glucose, UA: NEGATIVE
KETONES UA: NEGATIVE
Leukocytes, UA: NEGATIVE
Nitrite, UA: NEGATIVE
PH UA: 6
PROTEIN UA: NEGATIVE
SPEC GRAV UA: 1.025
UROBILINOGEN UA: 0.2

## 2013-08-23 LAB — BASIC METABOLIC PANEL
BUN: 19 mg/dL (ref 6–23)
CHLORIDE: 104 meq/L (ref 96–112)
CO2: 29 mEq/L (ref 19–32)
Calcium: 9.3 mg/dL (ref 8.4–10.5)
Creatinine, Ser: 1.2 mg/dL (ref 0.4–1.5)
GFR: 66.51 mL/min (ref >60.00)
GLUCOSE: 102 mg/dL — AB (ref 70–99)
POTASSIUM: 4.8 meq/L (ref 3.5–5.1)
SODIUM: 138 meq/L (ref 135–145)

## 2013-08-23 LAB — CBC WITH DIFFERENTIAL/PLATELET
BASOS PCT: 0.4 % (ref 0.0–3.0)
Basophils Absolute: 0 10*3/uL (ref 0.0–0.1)
EOS PCT: 1.6 % (ref 0.0–5.0)
Eosinophils Absolute: 0.1 10*3/uL (ref 0.0–0.7)
HCT: 44.6 % (ref 39.0–52.0)
Hemoglobin: 15.3 g/dL (ref 13.0–17.0)
LYMPHS PCT: 49.1 % — AB (ref 12.0–46.0)
Lymphs Abs: 3 10*3/uL (ref 0.7–4.0)
MCHC: 34.2 g/dL (ref 30.0–36.0)
MCV: 93.8 fl (ref 78.0–100.0)
MONO ABS: 0.5 10*3/uL (ref 0.1–1.0)
Monocytes Relative: 8.2 % (ref 3.0–12.0)
NEUTROS PCT: 40.7 % — AB (ref 43.0–77.0)
Neutro Abs: 2.5 10*3/uL (ref 1.4–7.7)
PLATELETS: 145 10*3/uL — AB (ref 150.0–400.0)
RBC: 4.76 Mil/uL (ref 4.22–5.81)
RDW: 13.5 % (ref 11.5–14.6)
WBC: 6.1 10*3/uL (ref 4.5–10.5)

## 2013-08-23 LAB — LIPID PANEL
CHOLESTEROL: 137 mg/dL (ref 0–200)
HDL: 56.5 mg/dL (ref >39.00)
LDL CALC: 71 mg/dL (ref 0–99)
Total CHOL/HDL Ratio: 2
Triglycerides: 48 mg/dL (ref 0.0–149.0)
VLDL: 9.6 mg/dL (ref 0.0–40.0)

## 2013-08-23 LAB — HEPATIC FUNCTION PANEL
ALBUMIN: 3.8 g/dL (ref 3.5–5.2)
ALK PHOS: 39 U/L (ref 39–117)
ALT: 15 U/L (ref 0–53)
AST: 21 U/L (ref 0–37)
BILIRUBIN DIRECT: 0.2 mg/dL (ref 0.0–0.3)
TOTAL PROTEIN: 6.6 g/dL (ref 6.0–8.3)
Total Bilirubin: 1.1 mg/dL (ref 0.3–1.2)

## 2013-08-23 LAB — PSA: PSA: 1.4 ng/mL (ref 0.10–4.00)

## 2013-08-23 LAB — TSH: TSH: 2 u[IU]/mL (ref 0.35–5.50)

## 2013-08-30 ENCOUNTER — Encounter: Payer: Self-pay | Admitting: Family Medicine

## 2013-08-30 ENCOUNTER — Ambulatory Visit (INDEPENDENT_AMBULATORY_CARE_PROVIDER_SITE_OTHER): Payer: Managed Care, Other (non HMO) | Admitting: Family Medicine

## 2013-08-30 VITALS — BP 126/82 | HR 62 | Temp 97.8°F | Ht 68.0 in | Wt 182.0 lb

## 2013-08-30 DIAGNOSIS — Z0000 Encounter for general adult medical examination without abnormal findings: Secondary | ICD-10-CM

## 2013-08-30 DIAGNOSIS — Z23 Encounter for immunization: Secondary | ICD-10-CM

## 2013-08-30 MED ORDER — SIMVASTATIN 20 MG PO TABS: 20.0000 mg | ORAL_TABLET | Freq: Every day | ORAL | Status: DC

## 2013-08-30 MED ORDER — CITALOPRAM HYDROBROMIDE 20 MG PO TABS: 20.0000 mg | ORAL_TABLET | Freq: Every day | ORAL | Status: DC

## 2013-08-30 NOTE — Addendum Note (Signed)
Addended by: Marcina Millard on: 08/30/2013 10:03 AM   Modules accepted: Orders

## 2013-08-30 NOTE — Progress Notes (Signed)
   Subjective:    Patient ID: Don Medina, male    DOB: February 03, 1941, 73 y.o.   MRN: [id number]  HPI Seen for complete physical Generally very healthy. Exercises most days a week. He has history of hyperlipidemia treated with simvastatin. He has history of PVCs which are occasionally symptomatic. No recent dizziness with exercise. No recent chest pains. Nonsmoker. Colonoscopy up to date. Has had previous Pneumovax and received flu vaccine earlier this year.  He has history of prediabetes. No polyuria or polydipsia.  Past Medical History  Diagnosis Date  . Arthritis   . Blood in the stool   . Chicken pox   . Hay fever   . History of colonic polyps   . Hyperlipidemia   . History of colonoscopy    Past Surgical History  Procedure Laterality Date  . Herniated disk surgery c 6-7  1986  . Herniated diks surgery l-5  1996  . Abdominal adhesion surgery  2007    reports that he quit smoking about 39 years ago. He does not have any smokeless tobacco history on file. He reports that he does not drink alcohol or use illicit drugs. family history includes Alzheimer's disease (age of onset: 73) in his father; Cancer in his brother; Hypertension in an other family member; Stroke (age of onset: 54) in his mother; Sudden death in his other. There is no history of Heart disease. No Known Allergies    Review of Systems  Constitutional: Negative for fever, activity change, appetite change and fatigue.  HENT: Negative for congestion, ear pain and trouble swallowing.   Eyes: Negative for pain and visual disturbance.  Respiratory: Negative for cough, shortness of breath and wheezing.   Cardiovascular: Negative for chest pain and palpitations.  Gastrointestinal: Negative for nausea, vomiting, abdominal pain, diarrhea, constipation, blood in stool, abdominal distention and rectal pain.  Endocrine: Negative for polydipsia and polyuria.  Genitourinary: Negative for dysuria, hematuria and testicular  pain.  Musculoskeletal: Negative for arthralgias and joint swelling.  Skin: Negative for rash.  Neurological: Negative for dizziness, syncope and headaches.  Hematological: Negative for adenopathy.  Psychiatric/Behavioral: Negative for confusion and dysphoric mood.       Objective:   Physical Exam  Constitutional: He is oriented to person, place, and time. He appears well-developed and well-nourished. No distress.  HENT:  Head: Normocephalic and atraumatic.  Right Ear: External ear normal.  Left Ear: External ear normal.  Mouth/Throat: Oropharynx is clear and moist.  Eyes: Conjunctivae and EOM are normal. Pupils are equal, round, and reactive to light.  Neck: Normal range of motion. Neck supple. No thyromegaly present.  Cardiovascular: Normal rate, regular rhythm and normal heart sounds.   No murmur heard. Pulmonary/Chest: No respiratory distress. He has no wheezes. He has no rales.  Abdominal: Soft. Bowel sounds are normal. He exhibits no distension and no mass. There is no tenderness. There is no rebound and no guarding.  Genitourinary: Rectum normal and prostate normal.  Musculoskeletal: He exhibits no edema.  Lymphadenopathy:    He has no cervical adenopathy.  Neurological: He is alert and oriented to person, place, and time. He displays normal reflexes. No cranial nerve deficit.  Skin: No rash noted.  Psychiatric: He has a normal mood and affect.          Assessment & Plan:  Complete physical. Prevnar 13 given. Labs reviewed all favorable. Continue yearly flu vaccine. Repeat colonoscopy in a couple years.

## 2013-08-30 NOTE — Patient Instructions (Signed)
Continue with yearly flu vaccine.  Medications have been refilled for one year

## 2013-08-30 NOTE — Progress Notes (Signed)
Pre visit review using our clinic review tool, if applicable. No additional management support is needed unless otherwise documented below in the visit note. 

## 2013-09-01 ENCOUNTER — Encounter: Payer: Self-pay | Admitting: Family Medicine

## 2013-11-29 ENCOUNTER — Other Ambulatory Visit: Payer: Self-pay | Admitting: Dermatology

## 2013-12-14 ENCOUNTER — Encounter: Payer: Self-pay | Admitting: Family Medicine

## 2014-02-04 ENCOUNTER — Encounter: Payer: Self-pay | Admitting: Family Medicine

## 2014-02-05 ENCOUNTER — Telehealth: Payer: Self-pay | Admitting: Family Medicine

## 2014-02-05 NOTE — Telephone Encounter (Signed)
Chana Bode from [facility] called to say that there was a gap in pt refilling his simvastatin (ZOCOR) 20 MG tablet and she wanted the doctor to be aware just in case there was a health concern since he was missing dosages

## 2014-05-24 ENCOUNTER — Other Ambulatory Visit: Payer: Self-pay

## 2014-08-12 ENCOUNTER — Ambulatory Visit (INDEPENDENT_AMBULATORY_CARE_PROVIDER_SITE_OTHER): Payer: Medicare HMO | Admitting: Family Medicine

## 2014-08-12 ENCOUNTER — Encounter: Payer: Self-pay | Admitting: Family Medicine

## 2014-08-12 VITALS — BP 130/80 | HR 68 | Temp 97.5°F | Wt 180.0 lb

## 2014-08-12 DIAGNOSIS — J209 Acute bronchitis, unspecified: Secondary | ICD-10-CM

## 2014-08-12 NOTE — Progress Notes (Signed)
Pre visit review using our clinic review tool, if applicable. No additional management support is needed unless otherwise documented below in the visit note. 

## 2014-08-12 NOTE — Patient Instructions (Signed)

## 2014-08-12 NOTE — Progress Notes (Signed)
   Subjective:    Patient ID: Don Medina, male    DOB: [birth year], 74 y.o.   MRN: [id number]  HPI Acute visit. Patient had 2-3 day history of cough. He describes typical "cold" type symptoms back in December which eventually improved. This seems to be a separate illness. He's had some fatigue but denies any fever or chills. Minimal nasal congestion. No headache. Cough is mostly nonproductive. He took some NyQuil last night which helped. Nonsmoker  Past Medical History  Diagnosis Date  . Arthritis   . Blood in the stool   . Chicken pox   . Hay fever   . History of colonic polyps   . Hyperlipidemia   . History of colonoscopy    Past Surgical History  Procedure Laterality Date  . Herniated disk surgery c 6-7  1986  . Herniated diks surgery l-5  1996  . Abdominal adhesion surgery  2007    reports that he quit smoking about 40 years ago. He does not have any smokeless tobacco history on file. He reports that he does not drink alcohol or use illicit drugs. family history includes Alzheimer's disease (age of onset: 45) in his father; Cancer in his brother; Hypertension in an other family member; Stroke (age of onset: 1) in his mother; Sudden death in his other. There is no history of Heart disease. No Known Allergies    Review of Systems  Constitutional: Positive for fatigue. Negative for fever and chills.  HENT: Positive for congestion.   Respiratory: Positive for cough. Negative for shortness of breath and wheezing.        Objective:   Physical Exam  Constitutional: He appears well-developed and well-nourished.  HENT:  Right Ear: External ear normal.  Left Ear: External ear normal.  Mouth/Throat: Oropharynx is clear and moist.  Neck: Neck supple.  Cardiovascular: Normal rate and regular rhythm.   Pulmonary/Chest: Effort normal and breath sounds normal. No respiratory distress. He has no wheezes. He has no rales.  Lymphadenopathy:    He has no cervical adenopathy.           Assessment & Plan:  Cough. Suspect acute viral bronchitis. Nonfocal exam. Continue over-the-counter cough medications as needed. Follow-up for fever or worsening symptoms

## 2014-08-13 ENCOUNTER — Encounter: Payer: Self-pay | Admitting: Family Medicine

## 2014-08-15 ENCOUNTER — Other Ambulatory Visit: Payer: Self-pay

## 2014-08-15 ENCOUNTER — Encounter: Payer: Self-pay | Admitting: Family Medicine

## 2014-08-15 MED ORDER — PREDNISONE 20 MG PO TABS: 20.0000 mg | ORAL_TABLET | Freq: Two times a day (BID) | ORAL | Status: DC

## 2014-08-18 ENCOUNTER — Encounter: Payer: Self-pay | Admitting: Family Medicine

## 2014-08-19 ENCOUNTER — Encounter: Payer: Self-pay | Admitting: Family Medicine

## 2014-08-20 ENCOUNTER — Encounter: Payer: Self-pay | Admitting: Family Medicine

## 2014-08-21 ENCOUNTER — Other Ambulatory Visit: Payer: Self-pay

## 2014-08-21 MED ORDER — AMOXICILLIN 875 MG PO TABS: 875.0000 mg | ORAL_TABLET | Freq: Two times a day (BID) | ORAL | Status: DC

## 2014-08-22 ENCOUNTER — Encounter: Payer: Self-pay | Admitting: Family Medicine

## 2014-08-23 ENCOUNTER — Encounter: Payer: Self-pay | Admitting: Family Medicine

## 2014-08-27 ENCOUNTER — Encounter: Payer: Self-pay | Admitting: Family Medicine

## 2014-11-06 ENCOUNTER — Encounter: Payer: Self-pay | Admitting: Family Medicine

## 2014-11-06 ENCOUNTER — Other Ambulatory Visit: Payer: Self-pay

## 2014-11-06 MED ORDER — SIMVASTATIN 20 MG PO TABS: 20.0000 mg | ORAL_TABLET | Freq: Every day | ORAL | Status: DC

## 2014-11-06 MED ORDER — CITALOPRAM HYDROBROMIDE 20 MG PO TABS: 20.0000 mg | ORAL_TABLET | Freq: Every day | ORAL | Status: DC

## 2015-07-27 ENCOUNTER — Other Ambulatory Visit: Payer: Self-pay | Admitting: Family Medicine

## 2015-11-07 DIAGNOSIS — R69 Illness, unspecified: Secondary | ICD-10-CM | POA: Diagnosis not present

## 2015-12-02 DIAGNOSIS — L821 Other seborrheic keratosis: Secondary | ICD-10-CM | POA: Diagnosis not present

## 2015-12-02 DIAGNOSIS — D1801 Hemangioma of skin and subcutaneous tissue: Secondary | ICD-10-CM | POA: Diagnosis not present

## 2015-12-02 DIAGNOSIS — M720 Palmar fascial fibromatosis [Dupuytren]: Secondary | ICD-10-CM | POA: Diagnosis not present

## 2015-12-02 DIAGNOSIS — D225 Melanocytic nevi of trunk: Secondary | ICD-10-CM | POA: Diagnosis not present

## 2015-12-02 DIAGNOSIS — L570 Actinic keratosis: Secondary | ICD-10-CM | POA: Diagnosis not present

## 2015-12-02 DIAGNOSIS — L814 Other melanin hyperpigmentation: Secondary | ICD-10-CM | POA: Diagnosis not present

## 2015-12-04 ENCOUNTER — Emergency Department (HOSPITAL_COMMUNITY)
Admission: EM | Admit: 2015-12-04 | Discharge: 2015-12-04 | Disposition: A | Discharge status: Home / Self Care | Payer: Medicare HMO | Attending: Emergency Medicine | Admitting: Emergency Medicine

## 2015-12-04 ENCOUNTER — Emergency Department (HOSPITAL_COMMUNITY): Payer: Medicare HMO

## 2015-12-04 ENCOUNTER — Telehealth: Payer: Self-pay | Admitting: Family Medicine

## 2015-12-04 ENCOUNTER — Encounter (HOSPITAL_COMMUNITY): Payer: Self-pay | Admitting: Emergency Medicine

## 2015-12-04 DIAGNOSIS — M1990 Unspecified osteoarthritis, unspecified site: Secondary | ICD-10-CM | POA: Diagnosis not present

## 2015-12-04 DIAGNOSIS — Z79899 Other long term (current) drug therapy: Secondary | ICD-10-CM | POA: Diagnosis not present

## 2015-12-04 DIAGNOSIS — Z87891 Personal history of nicotine dependence: Secondary | ICD-10-CM | POA: Diagnosis not present

## 2015-12-04 DIAGNOSIS — R42 Dizziness and giddiness: Secondary | ICD-10-CM | POA: Diagnosis not present

## 2015-12-04 DIAGNOSIS — R4182 Altered mental status, unspecified: Secondary | ICD-10-CM | POA: Diagnosis not present

## 2015-12-04 DIAGNOSIS — E785 Hyperlipidemia, unspecified: Secondary | ICD-10-CM | POA: Insufficient documentation

## 2015-12-04 DIAGNOSIS — Z8719 Personal history of other diseases of the digestive system: Secondary | ICD-10-CM | POA: Diagnosis not present

## 2015-12-04 DIAGNOSIS — Z8709 Personal history of other diseases of the respiratory system: Secondary | ICD-10-CM | POA: Diagnosis not present

## 2015-12-04 DIAGNOSIS — Z86010 Personal history of colonic polyps: Secondary | ICD-10-CM | POA: Diagnosis not present

## 2015-12-04 DIAGNOSIS — R531 Weakness: Secondary | ICD-10-CM | POA: Diagnosis not present

## 2015-12-04 DIAGNOSIS — Z8619 Personal history of other infectious and parasitic diseases: Secondary | ICD-10-CM | POA: Diagnosis not present

## 2015-12-04 LAB — BASIC METABOLIC PANEL
Anion gap: 8 (ref 5–15)
BUN: 20 mg/dL (ref 6–20)
CHLORIDE: 101 mmol/L (ref 101–111)
CO2: 28 mmol/L (ref 22–32)
CREATININE: 1.09 mg/dL (ref 0.61–1.24)
Calcium: 9.2 mg/dL (ref 8.9–10.3)
Glucose, Bld: 154 mg/dL — ABNORMAL HIGH (ref 65–99)
POTASSIUM: 3.9 mmol/L (ref 3.5–5.1)
Sodium: 137 mmol/L (ref 135–145)

## 2015-12-04 LAB — URINALYSIS, ROUTINE W REFLEX MICROSCOPIC
Bilirubin Urine: NEGATIVE
Glucose, UA: NEGATIVE mg/dL
Hgb urine dipstick: NEGATIVE
KETONES UR: NEGATIVE mg/dL
LEUKOCYTES UA: NEGATIVE
NITRITE: NEGATIVE
PROTEIN: NEGATIVE mg/dL
Specific Gravity, Urine: 1.02 (ref 1.005–1.030)
pH: 7 (ref 5.0–8.0)

## 2015-12-04 LAB — CBC
HEMATOCRIT: 42.4 % (ref 39.0–52.0)
Hemoglobin: 14.8 g/dL (ref 13.0–17.0)
MCH: 31.7 pg (ref 26.0–34.0)
MCHC: 34.9 g/dL (ref 30.0–36.0)
MCV: 90.8 fL (ref 78.0–100.0)
PLATELETS: 148 10*3/uL — AB (ref 150–400)
RBC: 4.67 MIL/uL (ref 4.22–5.81)
RDW: 13.1 % (ref 11.5–15.5)
WBC: 6.2 10*3/uL (ref 4.0–10.5)

## 2015-12-04 LAB — TROPONIN I

## 2015-12-04 MED ORDER — SODIUM CHLORIDE 0.9 % IV BOLUS (SEPSIS)
1000.0000 mL | Freq: Once | INTRAVENOUS | Status: AC
  Administered 2015-12-04: 1000 mL via INTRAVENOUS

## 2015-12-04 MED ORDER — ASPIRIN 81 MG PO CHEW: 324.0000 mg | CHEWABLE_TABLET | Freq: Every day | ORAL | Status: DC

## 2015-12-04 NOTE — ED Notes (Signed)
Patient transported to X-ray 

## 2015-12-04 NOTE — Telephone Encounter (Signed)
Appt scheduled by triage([facility]).  Patient scheduled to see pcp tomorrow (12/05/15) at 1:45pm.

## 2015-12-04 NOTE — ED Notes (Addendum)
Pt reports generalized weakness for the past few days. Pt reports weakness came on suddenly. Denies CP, SOB, or dizziness. Pt has also had mild nausea, no emesis.

## 2015-12-04 NOTE — Telephone Encounter (Signed)
[facility] - Client [place] Patient Name: Don Medina DOB: [birth year] Initial Comment Caller states experiencing light headiness and weakness in joints. Second time this has happened in a week Nurse Assessment Nurse: Dimas Chyle, RN, Dellis Filbert Date/Time Eilene Ghazi Time): 12/04/2015 12:36:00 PM Confirm and document reason for call. If symptomatic, describe symptoms. You must click the next button to save text entered. ---Caller states experiencing light headiness and weakness in joints. Second time this has happened in a week. Happened again this morning. B/P 140/83. Has the patient traveled out of the country within the last 30 days? ---No Does the patient have any new or worsening symptoms? ---Yes Will a triage be completed? ---Yes Related visit to physician within the last 2 weeks? ---No Does the PT have any chronic conditions? (i.e. diabetes, asthma, etc.) ---No Is this a behavioral health or substance abuse call? ---No Guidelines Guideline Title Affirmed Question Affirmed Notes Dizziness - Lightheadedness [1] MODERATE dizziness (e.g., interferes with normal activities) AND [2] has NOT been evaluated by physician for this (Exception: dizziness caused by heat exposure, sudden standing, or poor fluid intake) Final Disposition User See Physician within 24 Hours Dimas Chyle, RN, [employer] Referrals REFERRED TO PCP OFFICE Disagree/Comply: Leta Baptist

## 2015-12-04 NOTE — Discharge Instructions (Signed)
Confusion Confusion is the inability to think with your usual speed or clarity. Confusion may come on quickly or slowly over time. How quickly the confusion comes on depends on the cause. Confusion can be due to any number of causes. CAUSES   Concussion, head injury, or head trauma.  Seizures.  Stroke.  Fever.  Brain tumor.  Age related decreased brain function (dementia).  Heightened emotional states like rage or terror.  Mental illness in which the person loses the ability to determine what is real and what is not (hallucinations).  Infections such as a urinary tract infection (UTI).  Toxic effects from alcohol, drugs, or prescription medicines.  Dehydration and an imbalance of salts in the body (electrolytes).  Lack of sleep.  Low blood sugar (diabetes).  Low levels of oxygen from conditions such as chronic lung disorders.  Drug interactions or other medicine side effects.  Nutritional deficiencies, especially niacin, thiamine, vitamin C, or vitamin B.  Sudden drop in body temperature (hypothermia).  Change in routine, such as when traveling or hospitalized. SIGNS AND SYMPTOMS  People often describe their thinking as cloudy or unclear when they are confused. Confusion can also include feeling disoriented. That means you are unaware of where or who you are. You may also not know what the date or time is. If confused, you may also have difficulty paying attention, remembering, and making decisions. Some people also act aggressively when they are confused.  DIAGNOSIS  The medical evaluation of confusion may include:  Blood and urine tests.  X-rays.  Brain and nervous system tests.  Analyzing your brain waves (electroencephalogram or EEG).  Magnetic resonance imaging (MRI) of your head.  Computed tomography (CT) scan of your head.  Mental status tests in which your health care provider may ask many questions. Some of these questions may seem silly or strange,  but they are a very important test to help diagnose and treat confusion. TREATMENT  An admission to the hospital may not be needed, but a person with confusion should not be left alone. Stay with a family member or friend until the confusion clears. Avoid alcohol, pain relievers, or sedative drugs until you have fully recovered. Do not drive until directed by your health care provider. HOME CARE INSTRUCTIONS  What family and friends can do:  To find out if someone is confused, ask the person to state his or her name, age, and the date. If the person is unsure or answers incorrectly, he or she is confused.  Always introduce yourself, no matter how well the person knows you.  Often remind the person of his or her location.  Place a calendar and clock near the confused person.  Help the person with his or her medicines. You may want to use a pill box, an alarm as a reminder, or give the person each dose as prescribed.  Talk about current events and plans for the day.  Try to keep the environment calm, quiet, and peaceful.  Make sure the person keeps follow-up visits with his or her health care provider. PREVENTION  Ways to prevent confusion:  Avoid alcohol.  Eat a balanced diet.  Get enough sleep.  Take medicine only as directed by your health care provider.  Do not become isolated. Spend time with other people and make plans for your days.  Keep careful watch on your blood sugar levels if you are diabetic. SEEK IMMEDIATE MEDICAL CARE IF:   You develop severe headaches, repeated vomiting, seizures, blackouts, or   slurred speech.  There is increasing confusion, weakness, numbness, restlessness, or personality changes.  You develop a loss of balance, have marked dizziness, feel uncoordinated, or fall.  You have delusions, hallucinations, or develop severe anxiety.  Your family members think you need to be rechecked.   This information is not intended to replace advice given  to you by your health care provider. Make sure you discuss any questions you have with your health care provider.   Document Released: 09/02/2004 Document Revised: 08/16/2014 Document Reviewed: 08/31/2013 Elsevier Interactive Patient Education 2016 [employer]  

## 2015-12-04 NOTE — ED Provider Notes (Signed)
CSN: [id number]     Arrival date & time 12/04/15  1626 History   First MD Initiated Contact with Patient 12/04/15 1742     No chief complaint on file.  PT SAID THAT HE FEELS LIGHTHEADED AND NOT HIS NORMAL.  PT SAID IT HAPPENED WHEN HE WAS AT [employer] 2 DAYS AGO.  IT LASTED FOR ABOUT 1 HR.  HE WAS FINE YESTERDAY.  IT CAME BACK TODAY AND IT HAS CONTINUED.  HE HAS NEVER HAD ANYTHING LIKE THIS IN THE PAST.  (Consider location/radiation/quality/duration/timing/severity/associated sxs/prior Treatment) The history is provided by the patient.    Past Medical History  Diagnosis Date  . Arthritis   . Blood in the stool   . Chicken pox   . Hay fever   . History of colonic polyps   . Hyperlipidemia   . History of colonoscopy    Past Surgical History  Procedure Laterality Date  . Herniated disk surgery c 6-7  1986  . Herniated diks surgery l-5  1996  . Abdominal adhesion surgery  2007   Family History  Problem Relation Age of Onset  . Cancer Brother     Leukemia  . Sudden death Other     family  . Hypertension    . Alzheimer's disease Father 74  . Heart disease Neg Hx   . Stroke Mother 74   Social History  Substance Use Topics  . Smoking status: Former Smoker    Quit date: 08/09/1974  . Smokeless tobacco: None  . Alcohol Use: No    Review of Systems  Neurological: Positive for weakness.  All other systems reviewed and are negative.     Allergies  Review of patient's allergies indicates no known allergies.  Home Medications   Prior to Admission medications   Medication Sig Start Date End Date Taking? Authorizing Provider  citalopram (CELEXA) 20 MG tablet TAKE ONE TABLET BY MOUTH ONCE DAILY 07/28/15  Yes Eulas Post, MD  desonide (DESOWEN) 0.05 % cream Apply 1 application topically daily as needed. Flare up on face.   Yes Historical Provider, MD  folic acid (FOLVITE) 1 MG tablet Take 1 mg by mouth daily.     Yes Historical Provider, MD   Glucosamine-Chondroit-Biofl-Mn CAPS Take 1 capsule by mouth daily.     Yes Historical Provider, MD  Omega-3 Fatty Acids (FISH OIL) 1200 MG CAPS Take 1,200 mg by mouth 2 (two) times daily.     Yes Historical Provider, MD  simvastatin (ZOCOR) 20 MG tablet TAKE ONE TABLET BY MOUTH AT BEDTIME 07/28/15  Yes Eulas Post, MD  amoxicillin (AMOXIL) 875 MG tablet Take 1 tablet (875 mg total) by mouth 2 (two) times daily. Patient not taking: Reported on 12/04/2015 08/21/14   Eulas Post, MD  aspirin 81 MG chewable tablet Chew 4 tablets (324 mg total) by mouth daily. 12/04/15   Isla Pence, MD  predniSONE (DELTASONE) 20 MG tablet Take 1 tablet (20 mg total) by mouth 2 (two) times daily with a meal. Patient not taking: Reported on 12/04/2015 08/15/14   Eulas Post, MD   BP 138/80 mmHg  Pulse 55  Temp(Src) 97.9 F (36.6 C) (Oral)  Resp 16  Ht 5\' 8"  (1.727 m)  Wt 176 lb (79.833 kg)  BMI 26.77 kg/m2  SpO2 96% Physical Exam  Constitutional: He is oriented to person, place, and time. He appears well-developed and well-nourished.  HENT:  Head: Normocephalic and atraumatic.  Right Ear: External ear normal.  Left Ear:  External ear normal.  Nose: Nose normal.  Mouth/Throat: Oropharynx is clear and moist.  Eyes: Conjunctivae and EOM are normal. Pupils are equal, round, and reactive to light.  Neck: Normal range of motion. Neck supple.  Cardiovascular: Normal rate, regular rhythm, normal heart sounds and intact distal pulses.   Pulmonary/Chest: Effort normal and breath sounds normal.  Abdominal: Soft. Bowel sounds are normal.  Musculoskeletal: Normal range of motion.  Neurological: He is alert and oriented to person, place, and time. He has normal reflexes.  Skin: Skin is warm and dry.  Psychiatric: He has a normal mood and affect. His behavior is normal. Judgment and thought content normal.  Nursing note and vitals reviewed.   ED Course  Procedures (including critical care  time) Labs Review Labs Reviewed  BASIC METABOLIC PANEL - Abnormal; Notable for the following:    Glucose, Bld 154 (*)    All other components within normal limits  CBC - Abnormal; Notable for the following:    Platelets 148 (*)    All other components within normal limits  URINALYSIS, ROUTINE W REFLEX MICROSCOPIC (NOT AT [facility]) - Abnormal; Notable for the following:    APPearance CLOUDY (*)    All other components within normal limits  TROPONIN I    Imaging Review Dg Chest 2 View  12/04/2015  CLINICAL DATA:  Generalized weakness, lightheadedness, mild nausea. EXAM: CHEST  2 VIEW COMPARISON:  03/16/2010. FINDINGS: Trachea is midline. Heart size normal. Descending thoracic aorta is tortuous. Lungs are mildly hyperinflated but clear. No pleural fluid. Upper lumbar compression fracture, stable. IMPRESSION: No acute findings. Electronically Signed   By: Lorin Picket M.D.   On: 12/04/2015 18:36   Ct Head Wo Contrast  12/04/2015  CLINICAL DATA:  75 year old male with hyperlipidemia presenting with lightheadedness for 2 days with generalized weakness. No trauma. Initial encounter. EXAM: CT HEAD WITHOUT CONTRAST TECHNIQUE: Contiguous axial images were obtained from the base of the skull through the vertex without intravenous contrast. COMPARISON:  None. FINDINGS: No intracranial hemorrhage. Small left frontal lobe hypodensity probably represents result of remote infarct without CT evidence of large acute infarct. No intracranial mass lesion noted on this unenhanced exam. No hydrocephalus. Vascular calcifications. Degree of atrophy consistent with patient's age. Post lens replacement.  Minimal exophthalmos. Complete opacification left maxillary sinus which appears small. IMPRESSION: No intracranial hemorrhage. Small left frontal lobe hypodensity probably represents result of remote infarct without CT evidence of large acute infarct. No intracranial mass lesion noted on this unenhanced exam. Vascular  calcifications. Complete opacification left maxillary sinus which appears small. Electronically Signed   By: Genia Del M.D.   On: 12/04/2015 18:56   I have personally reviewed and evaluated these images and lab results as part of my medical decision-making.   EKG Interpretation   Date/Time:  Thursday December 04 2015 18:01:17 EDT Ventricular Rate:  52 PR Interval:  178 QRS Duration: 127 QT Interval:  482 QTC Calculation: 448 R Axis:   75 Text Interpretation:  Sinus rhythm Probable left atrial enlargement  Nonspecific intraventricular conduction delay Confirmed by Jannette Cotham MD,  Jarvin Ogren ([id number]) on 12/04/2015 6:22:38 PM      MDM  LABS AND CT WITHOUT ANYTHING ACUTE EXCEPT FOR GLUCOSE SLIGHTLY ELEVATED.  I FEEL PT IS OK FOR D/C WITH INCREASE IN ASA TO 325 MG AND F/U WITH NEURO IF SX PERSIST.  PT IS AMBULATORY AND LOOKS GOOD. Final diagnoses:  Altered mental status, unspecified altered mental status type  Isla Pence, MD 12/04/15 [phone]

## 2015-12-05 ENCOUNTER — Ambulatory Visit: Payer: Self-pay | Admitting: Family Medicine

## 2015-12-09 ENCOUNTER — Ambulatory Visit (INDEPENDENT_AMBULATORY_CARE_PROVIDER_SITE_OTHER): Payer: Medicare HMO | Admitting: Neurology

## 2015-12-09 ENCOUNTER — Encounter: Payer: Self-pay | Admitting: Neurology

## 2015-12-09 VITALS — BP 124/79 | HR 63 | Ht 68.0 in | Wt 180.2 lb

## 2015-12-09 DIAGNOSIS — E785 Hyperlipidemia, unspecified: Secondary | ICD-10-CM | POA: Insufficient documentation

## 2015-12-09 DIAGNOSIS — R531 Weakness: Secondary | ICD-10-CM | POA: Diagnosis not present

## 2015-12-09 HISTORY — DX: Weakness: R53.1

## 2015-12-09 NOTE — Patient Instructions (Addendum)
-   continue ASA and simvastatin for stroke prevention - will do MRI brain and MRA to evaluate brain vessels - will do EEG to rule out seizure - will do heart rate monitoring for 30 days  - will do ultrasound of heart and carotid ateries. - will draw blood today to check cholesterol and diabetes.  - if episode happens again, please let us know and keep a dairy. If prolonged and with other symptoms, consider go to ER for evaluation.  - follow up in 2 months.

## 2015-12-09 NOTE — Progress Notes (Addendum)
NEUROLOGY CLINIC NEW PATIENT NOTE  NAME: Don Medina DOB: [birth year] REFERRING PHYSICIAN: Eulas Post, MD  I saw Don Medina as a new consult in the neurovascular clinic today regarding  Chief Complaint  Patient presents with  . Referral    ER referral from [facility] for altered mental status, had DT scan  .  HPI: Don Medina is a 75 y.o. male with PMH of HLD and anxiety and PVCs who presents as a new patient for episodic feeling of foggy head and generalized weakness.   Pt stated that on 12/02/15 when he was in [place], walking with cart, suddenly he felt head foggy and fuzzy feeling, generalized weakness involving all extremities, but still able to walk, using arms, able to talk and have conversation, no fall. Denies any HA, N/V, vision changes, LOC or ear ringing, no vertigo or dizziness or CP or SOB or palpitation. Did not check BP at that time. He went to maintenance his car next door. While waiting he had nap and after the nap, he felt he was back to baseline. The episode lasted about 2.5 hours.   Then on 12/04/15 he had again similar episode when he was sitting in chair at home. He took a nap but symptoms continued. He called EMS, on arrival, his BP a little high, and he was sent to ER. Had CT head showed no acute abnormalities. His symptoms getting better after discharged from ED and resolved in 12 hours. K 3.9 and Glucose 154.   He never had this episodes in the past. Denies any hx of migraine HA. Has PMH of HLD on simvastatin, Anxiety on celexa, and PVCs but not able to tolerate beta blockers. He denies hx of HTN, DM, OSA. No smoking or alcohol or illicit drugs. Mom died of stroke and dad died of AD.   Past Medical History  Diagnosis Date  . Arthritis   . Blood in the stool   . Chicken pox   . Hay fever   . History of colonic polyps   . Hyperlipidemia   . History of colonoscopy    Past Surgical History  Procedure Laterality Date  . Herniated  disk surgery c 6-7  1986  . Herniated diks surgery l-5  1996  . Abdominal adhesion surgery  2007   Family History  Problem Relation Age of Onset  . Cancer Brother     Leukemia  . Sudden death Other     family  . Hypertension    . Alzheimer's disease Father 70  . Dementia Father   . Heart disease Neg Hx   . Stroke Mother 28   Current Outpatient Prescriptions  Medication Sig Dispense Refill  . aspirin 81 MG chewable tablet Chew 4 tablets (324 mg total) by mouth daily. (Patient taking differently: Chew 325 mg by mouth daily. ) 30 tablet 0  . citalopram (CELEXA) 20 MG tablet TAKE ONE TABLET BY MOUTH ONCE DAILY 90 tablet 1  . desonide (DESOWEN) 0.05 % cream Apply 1 application topically daily as needed. Flare up on face.    . folic acid (FOLVITE) 1 MG tablet Take 1 mg by mouth daily.      . Glucosamine-Chondroit-Biofl-Mn CAPS Take 1 capsule by mouth daily.      . Omega-3 Fatty Acids (FISH OIL) 1200 MG CAPS Take 1,200 mg by mouth 2 (two) times daily.      . simvastatin (ZOCOR) 20 MG tablet TAKE ONE TABLET BY MOUTH AT BEDTIME  90 tablet 1   No current facility-administered medications for this visit.   No Known Allergies Social History   Social History  . Marital Status: Married    Spouse Name: N/A  . Number of Children: N/A  . Years of Education: N/A   Occupational History  . Retired    Social History Main Topics  . Smoking status: Former Smoker    Quit date: 08/09/1974  . Smokeless tobacco: Not on file  . Alcohol Use: No  . Drug Use: No  . Sexual Activity: Not on file   Other Topics Concern  . Not on file   Social History Narrative    Review of Systems Full 14 system review of systems performed and notable only for those listed, all others are neg:  Constitutional:   Cardiovascular:  Ear/Nose/Throat:  Hearing loss, ringing in ears Skin:  Eyes:   Respiratory:   Gastroitestinal:   Genitourinary:  Hematology/Lymphatic:   Endocrine:  Musculoskeletal:     Allergy/Immunology:   Neurological:   Psychiatric:  Sleep:    Physical Exam  Filed Vitals:   12/09/15 0833  BP: 124/79  Pulse: 63    General - Well nourished, well developed, in no apparent distress.  Ophthalmologic - Sharp disc margins OU.  Cardiovascular - Regular rate and rhythm.   Neck - supple, no nuchal rigidity .  Mental Status -  Level of arousal and orientation to time, place, and person were intact. Language including expression, naming, repetition, comprehension, reading, and writing was assessed and found intact. Attention span and concentration were normal. Recent and remote memory were intact Fund of Knowledge was assessed and was intact.  Cranial Nerves II - XII - II - Visual field intact OU. III, IV, Medina - Extraocular movements intact. V - Facial sensation intact bilaterally. VII - Facial movement intact bilaterally. VIII - Hearing & vestibular intact bilaterally. X - Palate elevates symmetrically. XI - Chin turning & shoulder shrug intact bilaterally. XII - Tongue protrusion intact.  Motor Strength - The patient's strength was normal in all extremities and pronator drift was absent.  Bulk was normal and fasciculations were absent.   Motor Tone - Muscle tone was assessed at the neck and appendages and was normal.  Reflexes - The patient's reflexes were normal in all extremities and he had no pathological reflexes.  Sensory - Light touch, temperature/pinprick, vibration and proprioception, and Romberg testing were assessed and were normal.    Coordination - The patient had normal movements in the hands and feet with no ataxia or dysmetria.  Tremor was absent.  Gait and Station - The patient's transfers, posture, gait, station, and turns were observed as normal.  Dix-hallpike test - negative for BPPV   Imaging  I have personally reviewed the radiological images below and agree with the radiology interpretations.  CT head - No intracranial  hemorrhage. Small left frontal lobe hypodensity probably represents result of remote infarct without CT evidence of large acute infarct. No intracranial mass lesion noted on this unenhanced exam. Vascular calcifications. Complete opacification left maxillary sinus which appears small.  Lab Review None    Assessment and Plan:   In summary, Don Medina is a 75 y.o. male with PMH of HLD, anxiety and PVCs presents as new patient for 2 episodes of sudden onset fogginess and fuzziness in head and generalized subjective weakness, but able to perform activities. During the interval time, asymptomatic. No clear triggering factors. Episodes etiology unclear, DDx including hypotension, arrhythmia, seizure, migraine  equivalent, or brainstem TIA. However, none of them fits to the situation well. Will do further extensive work up to rule out more critical conditions.   - continue ASA and simvastatin for stroke prevention - will do MRI brain and MRA to evaluate brain vessels - will do EEG to rule out seizure - will do heart rate monitoring for 30 days  - will do ultrasound of heart and carotid ateries. - will draw blood today to check cholesterol and diabetes.  - follow up in 2 months.   A total of 45 minutes was spent face-to-face with this patient. Over half this time was spent on counseling patient on the diagnosis and different diagnostic and therapeutic options available.   Thank you very much for the opportunity to participate in the care of this patient.  Please do not hesitate to call if any questions or concerns arise.  Orders Placed This Encounter  Procedures  . MR Brain W Wo Contrast    Standing Status: Future     Number of Occurrences:      Standing Expiration Date: 02/09/2017    Order Specific Question:  Reason for Exam (SYMPTOM  OR DIAGNOSIS REQUIRED)    Answer:  TIA work up    Order Specific Question:  Preferred imaging location?    Answer:  Internal    Order Specific Question:   Does the patient have a pacemaker or implanted devices?    Answer:  No    Order Specific Question:  What is the patient's sedation requirement?    Answer:  No Sedation  . MR MRA HEAD WO CONTRAST    Standing Status: Future     Number of Occurrences:      Standing Expiration Date: 02/09/2017    Order Specific Question:  Reason for Exam (SYMPTOM  OR DIAGNOSIS REQUIRED)    Answer:  TIA work up    Order Specific Question:  Preferred imaging location?    Answer:  Internal    Order Specific Question:  Does the patient have a pacemaker or implanted devices?    Answer:  No    Order Specific Question:  What is the patient's sedation requirement?    Answer:  No Sedation  . US Carotid Bilateral    Standing Status: Future     Number of Occurrences:      Standing Expiration Date: 02/09/2017    Order Specific Question:  Reason for Exam (SYMPTOM  OR DIAGNOSIS REQUIRED)    Answer:  TIA work up    Order Specific Question:  Preferred imaging location?    Answer:  Internal  . Lipid panel  . Hemoglobin A1c  . Vitamin B12  . Cardiac event monitor    Standing Status: Future     Number of Occurrences:      Standing Expiration Date: 12/09/2016    Scheduling Instructions:     Request cardionet setup. Thank you.    Order Specific Question:  Where should this test be performed?    Answer:  [facility]  . ECHOCARDIOGRAM COMPLETE    Standing Status: Future     Number of Occurrences:      Standing Expiration Date: 03/09/2017    Order Specific Question:  Where should this test be performed    Answer:  [facility])    Order Specific Question:  Complete or Limited study?    Answer:  Complete    Order Specific Question:  With Image Enhancing Agent or without Image Enhancing  Agent?    Answer:  With Image Enhancing Agent    Order Specific Question:  Reason for exam-Echo    Answer:  TIA  435.9 / G45.9  . EEG adult    Standing Status: Future     Number of Occurrences:      Standing  Expiration Date: 12/08/2016    Order Specific Question:  Where should this test be performed?    Answer:  [facility]    No orders of the defined types were placed in this encounter.    Patient Instructions  - continue ASA and simvastatin for stroke prevention - will do MRI brain and MRA to evaluate brain vessels - will do EEG to rule out seizure - will do heart rate monitoring for 30 days  - will do ultrasound of heart and carotid ateries. - will draw blood today to check cholesterol and diabetes.  - if episode happens again, please let us know and keep a dairy. If prolonged and with other symptoms, consider go to ER for evaluation.  - follow up in 2 months.     Rosalin Hawking, MD PhD [facility] [address] [place], [place] [address] [phone]

## 2015-12-10 LAB — LIPID PANEL
Chol/HDL Ratio: 2.1 ratio units (ref 0.0–5.0)
Cholesterol, Total: 128 mg/dL (ref 100–199)
HDL: 60 mg/dL (ref >39)
LDL Calculated: 56 mg/dL (ref 0–99)
Triglycerides: 58 mg/dL (ref 0–149)
VLDL CHOLESTEROL CAL: 12 mg/dL (ref 5–40)

## 2015-12-10 LAB — VITAMIN B12: VITAMIN B 12: 705 pg/mL (ref 211–946)

## 2015-12-10 LAB — HEMOGLOBIN A1C
ESTIMATED AVERAGE GLUCOSE: 128 mg/dL
Hgb A1c MFr Bld: 6.1 % — ABNORMAL HIGH (ref 4.8–5.6)

## 2015-12-11 ENCOUNTER — Telehealth: Payer: Self-pay | Admitting: Neurology

## 2015-12-11 NOTE — Telephone Encounter (Signed)
Pt called sts with having echo and heart monitor scheduled for 5/23 will there be a cardiologist involved. He has seen a Dr Crissie Sickles in the past. Please call

## 2015-12-11 NOTE — Telephone Encounter (Addendum)
Rn call patient back about will be seeing a cardiologist at [address]. Pt stated he saw Dr.Greg Lovena Le almost 3 to 4 years ago at [address]. Rn stated he was seen by Dr. Erlinda Hong as a new patient on 12/09/2015. Pt has to get a MRi,MRA,. EEG,echo, and 30 day monitor. Pt ask will Dr. Erlinda Hong give him the results of all these test. Rn stated once the test are completed and report is up he will be given a call ifs it normal or abnormal. Pt has appt on 12/30/2015 with [facility] [address]. Rn stated to patient that Dr. Erlinda Hong will make the determination if he needs to start back seeing a cardiologist. Pt verbalized understanding.

## 2015-12-17 ENCOUNTER — Other Ambulatory Visit: Payer: Medicare HMO

## 2015-12-18 ENCOUNTER — Ambulatory Visit (INDEPENDENT_AMBULATORY_CARE_PROVIDER_SITE_OTHER): Payer: Medicare HMO

## 2015-12-18 ENCOUNTER — Ambulatory Visit
Admission: RE | Admit: 2015-12-18 | Discharge: 2015-12-18 | Disposition: A | Discharge status: Home / Self Care | Payer: Medicare HMO | Source: Ambulatory Visit | Attending: Neurology | Admitting: Neurology

## 2015-12-18 DIAGNOSIS — R531 Weakness: Secondary | ICD-10-CM | POA: Diagnosis not present

## 2015-12-18 MED ORDER — GADOBENATE DIMEGLUMINE 529 MG/ML IV SOLN
16.0000 mL | Freq: Once | INTRAVENOUS | Status: AC | PRN
  Administered 2015-12-18: 16 mL via INTRAVENOUS

## 2015-12-22 ENCOUNTER — Telehealth: Payer: Self-pay | Admitting: *Deleted

## 2015-12-22 NOTE — Telephone Encounter (Signed)
Per Dr Erlinda Hong, called patient and informed him that the MRI brain and MRA brain vessel images test done recently were unremarkable. Please continue current treatment plan. Dr Erlinda Hong will see what other tests show. Patient then inquired "Was he not concerned with the cyst that is there?"  Informed him that this RN will route this note to Dr Erlinda Hong to call him and discuss; patient stated "I would like to talk to him." Informed him Dr Erlinda Hong is in hospital this week but will most likely call him this week. He stated "well there's no urgency, but I would like to talk to him." He verbalized no other questions, verbalized understanding, appreciation of call.

## 2015-12-23 NOTE — Telephone Encounter (Signed)
Discussed with pt over the phone and his question was answered to his satisfaction.   Rosalin Hawking, MD PhD Stroke Neurology 12/23/2015 6:07 PM

## 2015-12-26 ENCOUNTER — Telehealth: Payer: Self-pay | Admitting: *Deleted

## 2015-12-26 NOTE — Telephone Encounter (Signed)
LMVM for pt that carotid doppler study normal.   He is to call back if questions.  (per DPR ok to LM).

## 2015-12-26 NOTE — Telephone Encounter (Signed)
-----   Message from Rosalin Hawking, MD sent at 12/25/2015 10:24 PM EDT ----- Could you please let the patient know that the carotid ultrasound test done recently in our office was a normal study. Please continue current treatment. Thanks.  Rosalin Hawking, MD PhD Stroke Neurology 12/25/2015 10:24 PM

## 2015-12-29 ENCOUNTER — Other Ambulatory Visit: Payer: Self-pay | Admitting: Neurology

## 2015-12-29 DIAGNOSIS — R42 Dizziness and giddiness: Secondary | ICD-10-CM

## 2015-12-29 DIAGNOSIS — I493 Ventricular premature depolarization: Secondary | ICD-10-CM

## 2015-12-29 DIAGNOSIS — R531 Weakness: Secondary | ICD-10-CM

## 2015-12-30 ENCOUNTER — Ambulatory Visit (INDEPENDENT_AMBULATORY_CARE_PROVIDER_SITE_OTHER): Payer: Medicare HMO

## 2015-12-30 ENCOUNTER — Other Ambulatory Visit: Payer: Self-pay

## 2015-12-30 ENCOUNTER — Ambulatory Visit (HOSPITAL_COMMUNITY): Payer: Medicare HMO | Attending: Neurology

## 2015-12-30 DIAGNOSIS — R531 Weakness: Secondary | ICD-10-CM

## 2015-12-30 DIAGNOSIS — Z87891 Personal history of nicotine dependence: Secondary | ICD-10-CM | POA: Diagnosis not present

## 2015-12-30 DIAGNOSIS — E785 Hyperlipidemia, unspecified: Secondary | ICD-10-CM | POA: Diagnosis not present

## 2015-12-30 DIAGNOSIS — G459 Transient cerebral ischemic attack, unspecified: Secondary | ICD-10-CM | POA: Insufficient documentation

## 2015-12-30 DIAGNOSIS — I493 Ventricular premature depolarization: Secondary | ICD-10-CM | POA: Insufficient documentation

## 2015-12-30 DIAGNOSIS — R42 Dizziness and giddiness: Secondary | ICD-10-CM

## 2016-01-07 ENCOUNTER — Ambulatory Visit (INDEPENDENT_AMBULATORY_CARE_PROVIDER_SITE_OTHER): Payer: Medicare HMO | Admitting: Neurology

## 2016-01-07 DIAGNOSIS — R4182 Altered mental status, unspecified: Secondary | ICD-10-CM | POA: Diagnosis not present

## 2016-01-07 DIAGNOSIS — R531 Weakness: Secondary | ICD-10-CM

## 2016-01-07 NOTE — Procedures (Signed)
    History:  Don Medina is a 75 year old gentleman with a history of episodes of generalized weakness involving all 4 extremities, and a sensation of feeling cognitively impaired. The patient returns to his baseline after a nap. The patient is being evaluated for these events.  This is a routine EEG. No skull defects are noted. Medications include aspirin, Celexa, folic acid, fish oil, Zocor, and glucosamine.   EEG classification: Normal awake and drowsy  Description of the recording: The background rhythms of this recording consists of a fairly well modulated medium amplitude alpha rhythm of 9 Hz that is reactive to eye opening and closure. As the record progresses, the patient appears to remain in the waking state throughout the recording. Photic stimulation was not performed. Hyperventilation was performed, resulting in a minimal buildup of the background rhythm activities without significant slowing seen. Toward the end of the recording, the patient enters the drowsy state with slight symmetric slowing seen. The patient never enters stage II sleep. At no time during the recording does there appear to be evidence of spike or spike wave discharges or evidence of focal slowing. EKG monitor shows no evidence of cardiac rhythm abnormalities with a heart rate of 60.  Impression: This is a normal EEG recording in the waking and drowsy state. No evidence of ictal or interictal discharges are seen.

## 2016-01-08 DIAGNOSIS — H43813 Vitreous degeneration, bilateral: Secondary | ICD-10-CM | POA: Diagnosis not present

## 2016-01-08 DIAGNOSIS — H26493 Other secondary cataract, bilateral: Secondary | ICD-10-CM | POA: Diagnosis not present

## 2016-01-12 ENCOUNTER — Telehealth: Payer: Self-pay

## 2016-01-12 NOTE — Telephone Encounter (Signed)
-----   Message from Rosalin Hawking, MD sent at 01/08/2016  7:31 PM EDT ----- Could you please let the patient know that the EEG test done recently in our office was normal. Please continue current treatment. Thanks.  Rosalin Hawking, MD PhD Stroke Neurology 01/08/2016 7:31 PM

## 2016-01-12 NOTE — Telephone Encounter (Signed)
LFt vm for patient to call back about EEG results. 

## 2016-01-14 NOTE — Telephone Encounter (Signed)
-----   Message from Rosalin Hawking, MD sent at 01/08/2016  7:31 PM EDT ----- Could you please let the patient know that the EEG test done recently in our office was normal. Please continue current treatment. Thanks.  Rosalin Hawking, MD PhD Stroke Neurology 01/08/2016 7:31 PM

## 2016-01-14 NOTE — Telephone Encounter (Signed)
Rn call patient to let him know the EEG was normal and to continue treatment plan. Pt verbalized understanding.

## 2016-02-04 ENCOUNTER — Encounter: Payer: Self-pay | Admitting: Family Medicine

## 2016-02-04 ENCOUNTER — Ambulatory Visit (INDEPENDENT_AMBULATORY_CARE_PROVIDER_SITE_OTHER): Payer: Medicare HMO | Admitting: Family Medicine

## 2016-02-04 DIAGNOSIS — M7581 Other shoulder lesions, right shoulder: Secondary | ICD-10-CM | POA: Diagnosis not present

## 2016-02-04 MED ORDER — METHYLPREDNISOLONE ACETATE 80 MG/ML IJ SUSP
80.0000 mg | Freq: Once | INTRAMUSCULAR | Status: AC
Start: 2016-02-04 — End: 2016-02-04
  Administered 2016-02-04: 80 mg via INTRA_ARTICULAR

## 2016-02-04 NOTE — Patient Instructions (Signed)
Rotator Cuff Tendinitis  Rotator cuff tendinitis is inflammation of the tough, cord-like bands that connect muscle to bone (tendons) in your rotator cuff. Your rotator cuff is the collection of all the muscles and tendons that connect your arm to your shoulder. Your rotator cuff holds the head of your upper arm bone (humerus) in the cup (fossa) of your shoulder blade (scapula).  CAUSES  Rotator cuff tendinitis is usually caused by overusing the joint involved.   SIGNS AND SYMPTOMS  · Deep ache in the shoulder also felt on the outside upper arm over the shoulder muscle.  · Point tenderness over the area that is injured.  · Pain comes on gradually and becomes worse with lifting the arm to the side (abduction) or turning it inward (internal rotation).  · May lead to a chronic tear: When a rotator cuff tendon becomes inflamed, it runs the risk of losing its blood supply, causing some tendon fibers to die. This increases the risk that the tendon can fray and partially or completely tear.  DIAGNOSIS  Rotator cuff tendinitis is diagnosed by taking a medical history, performing a physical exam, and reviewing results of imaging exams. The medical history is useful to help determine the type of rotator cuff injury. The physical exam will include looking at the injured shoulder, feeling the injured area, and watching you do range-of-motion exercises. X-ray exams are typically done to rule out other causes of shoulder pain, such as fractures. MRI is the imaging exam usually used for significant shoulder injuries. Sometimes a dye study called CT arthrogram is done, but it is not as widely used as MRI. In some institutions, special ultrasound tests may also be used to aid in the diagnosis.  TREATMENT   Less Severe Cases  · Use of a sling to rest the shoulder for a short period of time. Prolonged use of the sling can cause stiffness, weakness, and loss of motion of the shoulder joint.  · Anti-inflammatory medicines, such as  ibuprofen or naproxen sodium, may be prescribed.  More Severe Cases  · Physical therapy.  · Use of steroid injections into the shoulder joint.  · Surgery.  HOME CARE INSTRUCTIONS   · Use a sling or splint until the pain decreases. Prolonged use of the sling can cause stiffness, weakness, and loss of motion of the shoulder joint.  · Apply ice to the injured area:    Put ice in a plastic bag.    Place a towel between your skin and the bag.    Leave the ice on for 20 minutes, 2-3 times a day.  · Try to avoid use other than gentle range of motion while your shoulder is painful. Use the shoulder and exercise only as directed by your health care provider. Stop exercises or range of motion if pain or discomfort increases, unless directed otherwise by your health care provider.  · Only take over-the-counter or prescription medicines for pain, discomfort, or fever as directed by your health care provider.  · If you were given a shoulder sling and straps (immobilizer), do not remove it except as directed, or until you see a health care provider for a follow-up exam. If you need to remove it, move your arm as little as possible or as directed.  · You may want to sleep on several pillows at night to lessen swelling and pain.  SEEK IMMEDIATE MEDICAL CARE IF:   · Your shoulder pain increases or new pain develops in your arm, hand,   or fingers and is not relieved with medicines.  · You have new, unexplained symptoms, especially increased numbness in the hands or loss of strength.  · You develop any worsening of the problems that brought you in for care.  · Your arm, hand, or fingers are numb or tingling.  · Your arm, hand, or fingers are swollen, painful, or turn white or blue.  MAKE SURE YOU:  · Understand these instructions.  · Will watch your condition.  · Will get help right away if you are not doing well or get worse.     This information is not intended to replace advice given to you by your health care provider. Make sure  you discuss any questions you have with your health care provider.     Document Released: 10/16/2003 Document Revised: 08/16/2014 Document Reviewed: 03/07/2013  Elsevier Interactive Patient Education ©2016 [employer]

## 2016-02-04 NOTE — Progress Notes (Signed)
Pre visit review using our clinic review tool, if applicable. No additional management support is needed unless otherwise documented below in the visit note. 

## 2016-02-04 NOTE — Progress Notes (Signed)
   Subjective:    Patient ID: Don Medina, male    DOB: 01/12/1941, 75 y.o.   MRN: [id number]  HPI Acute visit for right shoulder pain. Onset this past Sunday. No specific injury. [place] week at the beach and after driving for several hours noticed some pain. Pain mostly with external rotation and abduction. Took some over-the-counter naproxen with minimal relief. No neck pain. No radiculopathy symptoms. Denies any upper extremity weakness or numbness.  Past Medical History  Diagnosis Date  . Arthritis   . Blood in the stool   . Chicken pox   . Hay fever   . History of colonic polyps   . Hyperlipidemia   . History of colonoscopy    Past Surgical History  Procedure Laterality Date  . Herniated disk surgery c 6-7  1986  . Herniated diks surgery l-5  1996  . Abdominal adhesion surgery  2007    reports that he quit smoking about 41 years ago. He does not have any smokeless tobacco history on file. He reports that he does not drink alcohol or use illicit drugs. family history includes Alzheimer's disease (age of onset: 7) in his father; Cancer in his brother; Dementia in his father; Stroke (age of onset: 43) in his mother; Sudden death in his other. There is no history of Heart disease. No Known Allergies    Review of Systems  Musculoskeletal: Negative for neck pain.  Neurological: Negative for weakness and numbness.       Objective:   Physical Exam  Constitutional: He appears well-developed and well-nourished.  Cardiovascular: Normal rate and regular rhythm.   Musculoskeletal:  Right shoulder reveals full range of motion. He does have pain with external rotation but not with internal rotation. He has some with abduction greater than 90 against resistance. No biceps tenderness. No acromioclavicular tenderness.  Neurological:  Symmetric upper extremity reflexes. Rotator cuff strength is full          Assessment & Plan:  Right shoulder pain. Suspect rotator cuff  tendinitis. We recommended some icing and maintain range of motion. He is requesting injection.  Discussed risks and benefits of corticosteroid injection and patient consented.  After prepping skin with betadine, injected 1 ccdepomedrol and 2 cc of plain xylocaine with 25 gauge one and one half inch needle using posterior lateral approach and pt tolerated well. He noted some immediate relief of pain. Will ice some tonight.  Touch base if not better in two weeks.  Eulas Post MD [facility]

## 2016-02-06 ENCOUNTER — Encounter: Payer: Self-pay | Admitting: Family Medicine

## 2016-02-09 ENCOUNTER — Other Ambulatory Visit: Payer: Self-pay | Admitting: Family Medicine

## 2016-02-11 ENCOUNTER — Telehealth: Payer: Self-pay

## 2016-02-11 NOTE — Telephone Encounter (Signed)
RN call patient back about his cardiac event monitor. Rn stated the cardiac monitor was negative for afib. Continue treatment plan. Pt verbalized understanding.

## 2016-02-11 NOTE — Telephone Encounter (Signed)
-----   Message from Rosalin Hawking, MD sent at 02/06/2016  1:42 AM EDT ----- Could you please let the patient know that the cardiac monitor test done recently was negative for afib. Please continue current treatment. Thanks.  Rosalin Hawking, MD PhD Stroke Neurology 02/06/2016 1:42 AM

## 2016-02-17 ENCOUNTER — Encounter: Payer: Self-pay | Admitting: Neurology

## 2016-02-17 ENCOUNTER — Ambulatory Visit (INDEPENDENT_AMBULATORY_CARE_PROVIDER_SITE_OTHER): Payer: Medicare HMO | Admitting: Neurology

## 2016-02-17 VITALS — BP 117/79 | HR 65 | Ht 68.0 in | Wt 175.0 lb

## 2016-02-17 DIAGNOSIS — R531 Weakness: Secondary | ICD-10-CM | POA: Diagnosis not present

## 2016-02-17 HISTORY — DX: Weakness: R53.1

## 2016-02-17 NOTE — Patient Instructions (Addendum)
-   continue ASA and simvastatin for stroke prevention - so far all tests negative  - will continue to observe  - let us know if further episodes - keep hydrated and check BP during the episode if recurs. - follow up in 6 months or sooner if needed.

## 2016-02-17 NOTE — Progress Notes (Signed)
NEUROLOGY CLINIC NEW PATIENT NOTE  NAME: Don Medina DOB: 1941-06-19 REFERRING PHYSICIAN: Eulas Post, MD  I saw Don Medina as a new consult in the neurovascular clinic today regarding  Chief Complaint  Patient presents with  . Follow-up    Follow up with  foggy head and weakness, pt has echo, Eeg, mra, mri,carotid doppler, and 30 day cardic monitor  .  HPI: Don Medina is a 75 y.o. male with PMH of HLD and anxiety and PVCs who presents as a new patient for episodic feeling of foggy head and generalized weakness.   Pt stated that on 12/02/15 when he was in [place], walking with cart, suddenly he felt head foggy and fuzzy feeling, generalized weakness involving all extremities, but still able to walk, using arms, able to talk and have conversation, no fall. Denies any HA, N/V, vision changes, LOC or ear ringing, no vertigo or dizziness or CP or SOB or palpitation. Did not check BP at that time. He went to maintenance his car next door. While waiting he had nap and after the nap, he felt he was back to baseline. The episode lasted about 2.5 hours.   Then on 12/04/15 he had again similar episode when he was sitting in chair at home. He took a nap but symptoms continued. He called EMS, on arrival, his BP a little high, and he was sent to ER. Had CT head showed no acute abnormalities. His symptoms getting better after discharged from ED and resolved in 12 hours. K 3.9 and Glucose 154.   He never had this episodes in the past. Denies any hx of migraine HA. Has PMH of HLD on simvastatin, Anxiety on celexa, and PVCs but not able to tolerate beta blockers. He denies hx of HTN, DM, OSA. No smoking or alcohol or illicit drugs. Mom died of stroke and dad died of AD.   Interval history During the interval time, pt has been doing well. No more episodes. Had extensive work up including MRI and MRA, CUS, TTE, EEG, 30 day monitoring, A1C and LDL all negative. He goes to gym  everyday and BP 117/79 today. He has no complains.   Past Medical History  Diagnosis Date  . Arthritis   . Blood in the stool   . Chicken pox   . Hay fever   . History of colonic polyps   . Hyperlipidemia   . History of colonoscopy    Past Surgical History  Procedure Laterality Date  . Herniated disk surgery c 6-7  1986  . Herniated diks surgery l-5  1996  . Abdominal adhesion surgery  2007   Family History  Problem Relation Age of Onset  . Cancer Brother     Leukemia  . Sudden death Other     family  . Hypertension    . Alzheimer's disease Father 54  . Dementia Father   . Heart disease Neg Hx   . Stroke Mother 16   Current Outpatient Prescriptions  Medication Sig Dispense Refill  . aspirin 81 MG tablet Take 81 mg by mouth daily.    . citalopram (CELEXA) 20 MG tablet TAKE ONE TABLET BY MOUTH ONCE DAILY 90 tablet 1  . desonide (DESOWEN) 0.05 % cream Apply 1 application topically daily as needed. Flare up on face.    . folic acid (FOLVITE) 1 MG tablet Take 1 mg by mouth daily.      . Glucosamine-Chondroit-Biofl-Mn CAPS Take 1 capsule by mouth daily.      [name] Kitchen  Omega-3 Fatty Acids (FISH OIL) 1200 MG CAPS Take 1,200 mg by mouth 2 (two) times daily.     . simvastatin (ZOCOR) 20 MG tablet TAKE ONE TABLET BY MOUTH AT BEDTIME 90 tablet 1   No current facility-administered medications for this visit.   No Known Allergies Social History   Social History  . Marital Status: Married    Spouse Name: N/A  . Number of Children: N/A  . Years of Education: N/A   Occupational History  . Retired    Social History Main Topics  . Smoking status: Former Smoker    Quit date: 08/09/1974  . Smokeless tobacco: Not on file  . Alcohol Use: No  . Drug Use: No  . Sexual Activity: Not on file   Other Topics Concern  . Not on file   Social History Narrative    Review of Systems Full 14 system review of systems performed and notable only for those listed, all others are neg:    Constitutional:   Cardiovascular:  Ear/Nose/Throat:   Skin:  Eyes:   Respiratory:   Gastroitestinal:   Genitourinary:  Hematology/Lymphatic:   Endocrine:  Musculoskeletal:   Allergy/Immunology:   Neurological:   Psychiatric:  Sleep:    Physical Exam  Filed Vitals:   02/17/16 1536  BP: 117/79  Pulse: 65    General - Well nourished, well developed, in no apparent distress.  Ophthalmologic - Sharp disc margins OU.  Cardiovascular - Regular rate and rhythm.   Neck - supple, no nuchal rigidity .  Mental Status -  Level of arousal and orientation to time, place, and person were intact. Language including expression, naming, repetition, comprehension, reading, and writing was assessed and found intact. Attention span and concentration were normal. Recent and remote memory were intact Fund of Knowledge was assessed and was intact.  Cranial Nerves II - XII - II - Visual field intact OU. III, IV, VI - Extraocular movements intact. V - Facial sensation intact bilaterally. VII - Facial movement intact bilaterally. VIII - Hearing & vestibular intact bilaterally. X - Palate elevates symmetrically. XI - Chin turning & shoulder shrug intact bilaterally. XII - Tongue protrusion intact.  Motor Strength - The patient's strength was normal in all extremities and pronator drift was absent.  Bulk was normal and fasciculations were absent.   Motor Tone - Muscle tone was assessed at the neck and appendages and was normal.  Reflexes - The patient's reflexes were normal in all extremities and he had no pathological reflexes.  Sensory - Light touch, temperature/pinprick, vibration and proprioception, and Romberg testing were assessed and were normal.    Coordination - The patient had normal movements in the hands and feet with no ataxia or dysmetria.  Tremor was absent.  Gait and Station - The patient's transfers, posture, gait, station, and turns were observed as  normal.  Dix-hallpike test - negative for BPPV   Imaging  I have personally reviewed the radiological images below and agree with the radiology interpretations.  CT head - No intracranial hemorrhage. Small left frontal lobe hypodensity probably represents result of remote infarct without CT evidence of large acute infarct. No intracranial mass lesion noted on this unenhanced exam. Vascular calcifications. Complete opacification left maxillary sinus which appears small.  MRI brain - Mildly abnormal MRI scan of the brain showing mild age appropriate generalized cerebral atrophy. A tiny left frontal periventricular cystic hyperintensities noted which may represent benign CSF cyst versus remote age tiny infarct. No enhancing lesions are  noted  MRA head - normal  CUS - Bilateral: 1-39% ICA stenosis. Vertebral artery flow is antegrade.  30 day cardiac event monitoring - no afib  TTE - Left ventricle: The cavity size was normal. There was mild  concentric hypertrophy. Systolic function was normal. The  estimated ejection fraction was in the range of 55% to 60%. Wall  motion was normal; there were no regional wall motion  abnormalities. The study was not technically sufficient to allow  evaluation of LV diastolic dysfunction due to frequent  ventricular ectopy - Aortic valve: Mildly calcified annulus. Trileaflet; normal  thickness, mildly calcified leaflets. - Mitral valve: Calcified annulus. - Right ventricle: The cavity size was moderately dilated. Wall  thickness was normal. Systolic function was mildly to moderately  reduced. - Impressions: Copmared to prior study, RV dimensions have  increased and RV function appears mildly reduced. Impressions: - Copmared to prior study, RV dimensions have increased and RV  function appears mildly reduced.  EEG - normal EEG  Lab Review Component     Latest Ref Rng 12/09/2015  Cholesterol, Total     100 - 199 mg/dL 128   Triglycerides     0 - 149 mg/dL 58  HDL Cholesterol     >39 mg/dL 60  VLDL Cholesterol Cal     5 - 40 mg/dL 12  LDL (calc)     0 - 99 mg/dL 56  Total CHOL/HDL Ratio     0.0 - 5.0 ratio units 2.1  Hemoglobin A1C     4.8 - 5.6 % 6.1 (H)  Est. average glucose Bld gHb Est-mCnc      128  Vitamin B12     211 - 946 pg/mL 705      Assessment:   In summary, Don Medina is a 75 y.o. male with PMH of HLD, anxiety and PVCs presents as new patient for 2 episodes of sudden onset fogginess and fuzziness in head and generalized subjective weakness, but able to perform activities. During the interval time, asymptomatic. No clear triggering factors. Episodes etiology unclear, DDx including hypotension, arrhythmia, seizure, migraine equivalent, or brainstem TIA. However, none of them fits to the situation well. Extensive work up done including MRI, MRA, CUS, TTE, EEG, 30 day cardiac monitoring, A1C and LDL, all negative. And pt has no more episodes.   Plan: - continue ASA and simvastatin for stroke prevention - so far all tests negative  - will continue to observe  - let us know if further episodes - keep hydrated and check BP during the episode if recurs. - follow up in 6 months or sooner if needed.   I spent more than 25 minutes of face to face time with the patient. Greater than 50% of time was spent in counseling and coordination of care. We discussed test results, reviewed images, talked about management plan.    No orders of the defined types were placed in this encounter.    No orders of the defined types were placed in this encounter.    Patient Instructions  - continue ASA and simvastatin for stroke prevention - so far all tests negative  - will continue to observe  - let us know if further episodes - keep hydrated and check BP during the episode if recurs. - follow up in 6 months or sooner if needed.     Rosalin Hawking, MD PhD [facility] [address], [place], [place] [address] [phone]

## 2016-03-03 DIAGNOSIS — R69 Illness, unspecified: Secondary | ICD-10-CM | POA: Diagnosis not present

## 2016-05-14 DIAGNOSIS — R69 Illness, unspecified: Secondary | ICD-10-CM | POA: Diagnosis not present

## 2016-05-18 DIAGNOSIS — R69 Illness, unspecified: Secondary | ICD-10-CM | POA: Diagnosis not present

## 2016-07-09 DIAGNOSIS — M4692 Unspecified inflammatory spondylopathy, cervical region: Secondary | ICD-10-CM | POA: Diagnosis not present

## 2016-07-09 DIAGNOSIS — M542 Cervicalgia: Secondary | ICD-10-CM | POA: Diagnosis not present

## 2016-07-15 DIAGNOSIS — M542 Cervicalgia: Secondary | ICD-10-CM | POA: Diagnosis not present

## 2016-07-15 DIAGNOSIS — M2560 Stiffness of unspecified joint, not elsewhere classified: Secondary | ICD-10-CM | POA: Diagnosis not present

## 2016-08-16 ENCOUNTER — Other Ambulatory Visit: Payer: Self-pay | Admitting: Family Medicine

## 2016-09-07 DIAGNOSIS — M2560 Stiffness of unspecified joint, not elsewhere classified: Secondary | ICD-10-CM | POA: Diagnosis not present

## 2016-09-07 DIAGNOSIS — M542 Cervicalgia: Secondary | ICD-10-CM | POA: Diagnosis not present

## 2016-09-08 DIAGNOSIS — M545 Low back pain: Secondary | ICD-10-CM | POA: Diagnosis not present

## 2016-09-08 DIAGNOSIS — M2560 Stiffness of unspecified joint, not elsewhere classified: Secondary | ICD-10-CM | POA: Diagnosis not present

## 2016-09-15 DIAGNOSIS — M2560 Stiffness of unspecified joint, not elsewhere classified: Secondary | ICD-10-CM | POA: Diagnosis not present

## 2016-09-15 DIAGNOSIS — M542 Cervicalgia: Secondary | ICD-10-CM | POA: Diagnosis not present

## 2016-09-16 DIAGNOSIS — M542 Cervicalgia: Secondary | ICD-10-CM | POA: Diagnosis not present

## 2016-09-16 DIAGNOSIS — M2560 Stiffness of unspecified joint, not elsewhere classified: Secondary | ICD-10-CM | POA: Diagnosis not present

## 2016-09-22 DIAGNOSIS — M542 Cervicalgia: Secondary | ICD-10-CM | POA: Diagnosis not present

## 2016-09-22 DIAGNOSIS — M2560 Stiffness of unspecified joint, not elsewhere classified: Secondary | ICD-10-CM | POA: Diagnosis not present

## 2016-12-02 DIAGNOSIS — L570 Actinic keratosis: Secondary | ICD-10-CM | POA: Diagnosis not present

## 2016-12-02 DIAGNOSIS — B078 Other viral warts: Secondary | ICD-10-CM | POA: Diagnosis not present

## 2016-12-02 DIAGNOSIS — D225 Melanocytic nevi of trunk: Secondary | ICD-10-CM | POA: Diagnosis not present

## 2016-12-02 DIAGNOSIS — L821 Other seborrheic keratosis: Secondary | ICD-10-CM | POA: Diagnosis not present

## 2016-12-02 DIAGNOSIS — D2262 Melanocytic nevi of left upper limb, including shoulder: Secondary | ICD-10-CM | POA: Diagnosis not present

## 2016-12-02 DIAGNOSIS — D2261 Melanocytic nevi of right upper limb, including shoulder: Secondary | ICD-10-CM | POA: Diagnosis not present

## 2016-12-02 DIAGNOSIS — L814 Other melanin hyperpigmentation: Secondary | ICD-10-CM | POA: Diagnosis not present

## 2017-01-07 LAB — HM COLONOSCOPY

## 2017-02-16 DIAGNOSIS — H26493 Other secondary cataract, bilateral: Secondary | ICD-10-CM | POA: Diagnosis not present

## 2017-02-16 DIAGNOSIS — H43813 Vitreous degeneration, bilateral: Secondary | ICD-10-CM | POA: Diagnosis not present

## 2017-03-05 ENCOUNTER — Other Ambulatory Visit: Payer: Self-pay | Admitting: Family Medicine

## 2017-03-22 DIAGNOSIS — H5319 Other subjective visual disturbances: Secondary | ICD-10-CM | POA: Diagnosis not present

## 2017-03-22 DIAGNOSIS — H26493 Other secondary cataract, bilateral: Secondary | ICD-10-CM | POA: Diagnosis not present

## 2017-04-28 ENCOUNTER — Encounter: Payer: Self-pay | Admitting: Family Medicine

## 2017-05-07 ENCOUNTER — Encounter (HOSPITAL_COMMUNITY): Payer: Self-pay

## 2017-05-07 ENCOUNTER — Emergency Department (HOSPITAL_COMMUNITY): Payer: Medicare HMO

## 2017-05-07 ENCOUNTER — Inpatient Hospital Stay (HOSPITAL_COMMUNITY)
Admission: EM | Admit: 2017-05-07 | Discharge: 2017-05-20 | DRG: 220 | Disposition: A | Discharge status: Home / Self Care | Payer: Medicare HMO | Attending: Cardiothoracic Surgery | Admitting: Cardiothoracic Surgery

## 2017-05-07 DIAGNOSIS — I10 Essential (primary) hypertension: Secondary | ICD-10-CM | POA: Diagnosis not present

## 2017-05-07 DIAGNOSIS — R0902 Hypoxemia: Secondary | ICD-10-CM | POA: Diagnosis not present

## 2017-05-07 DIAGNOSIS — I7101 Dissection of thoracic aorta: Principal | ICD-10-CM | POA: Diagnosis present

## 2017-05-07 DIAGNOSIS — Z9889 Other specified postprocedural states: Secondary | ICD-10-CM | POA: Diagnosis not present

## 2017-05-07 DIAGNOSIS — J90 Pleural effusion, not elsewhere classified: Secondary | ICD-10-CM | POA: Diagnosis not present

## 2017-05-07 DIAGNOSIS — I82C11 Acute embolism and thrombosis of right internal jugular vein: Secondary | ICD-10-CM | POA: Diagnosis not present

## 2017-05-07 DIAGNOSIS — G8918 Other acute postprocedural pain: Secondary | ICD-10-CM | POA: Diagnosis not present

## 2017-05-07 DIAGNOSIS — Z09 Encounter for follow-up examination after completed treatment for conditions other than malignant neoplasm: Secondary | ICD-10-CM

## 2017-05-07 DIAGNOSIS — I82612 Acute embolism and thrombosis of superficial veins of left upper extremity: Secondary | ICD-10-CM | POA: Diagnosis not present

## 2017-05-07 DIAGNOSIS — I82622 Acute embolism and thrombosis of deep veins of left upper extremity: Secondary | ICD-10-CM | POA: Diagnosis not present

## 2017-05-07 DIAGNOSIS — I480 Paroxysmal atrial fibrillation: Secondary | ICD-10-CM | POA: Diagnosis not present

## 2017-05-07 DIAGNOSIS — J9811 Atelectasis: Secondary | ICD-10-CM | POA: Diagnosis not present

## 2017-05-07 DIAGNOSIS — I959 Hypotension, unspecified: Secondary | ICD-10-CM | POA: Diagnosis not present

## 2017-05-07 DIAGNOSIS — M7989 Other specified soft tissue disorders: Secondary | ICD-10-CM | POA: Diagnosis not present

## 2017-05-07 DIAGNOSIS — Z7982 Long term (current) use of aspirin: Secondary | ICD-10-CM

## 2017-05-07 DIAGNOSIS — Z9289 Personal history of other medical treatment: Secondary | ICD-10-CM

## 2017-05-07 DIAGNOSIS — Z4682 Encounter for fitting and adjustment of non-vascular catheter: Secondary | ICD-10-CM | POA: Diagnosis not present

## 2017-05-07 DIAGNOSIS — E785 Hyperlipidemia, unspecified: Secondary | ICD-10-CM | POA: Diagnosis present

## 2017-05-07 DIAGNOSIS — R7303 Prediabetes: Secondary | ICD-10-CM | POA: Diagnosis present

## 2017-05-07 DIAGNOSIS — D696 Thrombocytopenia, unspecified: Secondary | ICD-10-CM | POA: Diagnosis not present

## 2017-05-07 DIAGNOSIS — D62 Acute posthemorrhagic anemia: Secondary | ICD-10-CM | POA: Diagnosis not present

## 2017-05-07 DIAGNOSIS — Z9689 Presence of other specified functional implants: Secondary | ICD-10-CM

## 2017-05-07 DIAGNOSIS — Z23 Encounter for immunization: Secondary | ICD-10-CM

## 2017-05-07 DIAGNOSIS — I82621 Acute embolism and thrombosis of deep veins of right upper extremity: Secondary | ICD-10-CM | POA: Diagnosis not present

## 2017-05-07 DIAGNOSIS — Z87891 Personal history of nicotine dependence: Secondary | ICD-10-CM

## 2017-05-07 DIAGNOSIS — Z930 Tracheostomy status: Secondary | ICD-10-CM | POA: Diagnosis not present

## 2017-05-07 DIAGNOSIS — M1991 Primary osteoarthritis, unspecified site: Secondary | ICD-10-CM | POA: Diagnosis present

## 2017-05-07 DIAGNOSIS — Z79899 Other long term (current) drug therapy: Secondary | ICD-10-CM

## 2017-05-07 DIAGNOSIS — M549 Dorsalgia, unspecified: Secondary | ICD-10-CM | POA: Diagnosis not present

## 2017-05-07 DIAGNOSIS — R0789 Other chest pain: Secondary | ICD-10-CM | POA: Diagnosis not present

## 2017-05-07 DIAGNOSIS — E8770 Fluid overload, unspecified: Secondary | ICD-10-CM | POA: Diagnosis not present

## 2017-05-07 DIAGNOSIS — R079 Chest pain, unspecified: Secondary | ICD-10-CM | POA: Diagnosis not present

## 2017-05-07 DIAGNOSIS — I351 Nonrheumatic aortic (valve) insufficiency: Secondary | ICD-10-CM | POA: Diagnosis present

## 2017-05-07 LAB — CBC
HEMATOCRIT: 39.9 % (ref 39.0–52.0)
HEMOGLOBIN: 14 g/dL (ref 13.0–17.0)
MCH: 31.1 pg (ref 26.0–34.0)
MCHC: 35.1 g/dL (ref 30.0–36.0)
MCV: 88.7 fL (ref 78.0–100.0)
Platelets: 141 10*3/uL — ABNORMAL LOW (ref 150–400)
RBC: 4.5 MIL/uL (ref 4.22–5.81)
RDW: 13.2 % (ref 11.5–15.5)
WBC: 8.3 10*3/uL (ref 4.0–10.5)

## 2017-05-07 LAB — BASIC METABOLIC PANEL
ANION GAP: 9 (ref 5–15)
BUN: 24 mg/dL — ABNORMAL HIGH (ref 6–20)
CALCIUM: 9.2 mg/dL (ref 8.9–10.3)
CO2: 22 mmol/L (ref 22–32)
Chloride: 105 mmol/L (ref 101–111)
Creatinine, Ser: 1.11 mg/dL (ref 0.61–1.24)
GLUCOSE: 118 mg/dL — AB (ref 65–99)
POTASSIUM: 4.3 mmol/L (ref 3.5–5.1)
SODIUM: 136 mmol/L (ref 135–145)

## 2017-05-07 LAB — I-STAT TROPONIN, ED: TROPONIN I, POC: 0 ng/mL (ref 0.00–0.08)

## 2017-05-07 LAB — D-DIMER, QUANTITATIVE (NOT AT ARMC): D DIMER QUANT: 7.98 ug{FEU}/mL — AB (ref 0.00–0.50)

## 2017-05-07 MED ORDER — IOPAMIDOL (ISOVUE-370) INJECTION 76%
INTRAVENOUS | Status: AC
  Administered 2017-05-07: 80 mL
  Filled 2017-05-07: qty 100

## 2017-05-07 MED ORDER — SODIUM CHLORIDE 0.9 % IV BOLUS (SEPSIS)
1000.0000 mL | Freq: Once | INTRAVENOUS | Status: AC
  Administered 2017-05-07: 1000 mL via INTRAVENOUS

## 2017-05-07 MED ORDER — SODIUM CHLORIDE 0.9 % IV BOLUS (SEPSIS)
1000.0000 mL | Freq: Once | INTRAVENOUS | Status: AC
  Administered 2017-05-08: 1000 mL via INTRAVENOUS

## 2017-05-07 MED ORDER — NITROGLYCERIN 0.4 MG SL SUBL: 0.4000 mg | SUBLINGUAL_TABLET | SUBLINGUAL | Status: DC | PRN

## 2017-05-07 NOTE — ED Notes (Signed)
Patient transported to X-ray 

## 2017-05-07 NOTE — ED Triage Notes (Signed)
Pt from home via EMS with sharp central chest pain radiating to jaw x 1 hr. Pt denies N/V, dizziness, SOB, or diaphoresis. Pt took 350 ASA at home, 1 NTG given en route, pt CP decreased from 8/10 to 2/10. Pt reports pain is worse with inspiration. 124/52, 60 bpm, 1005 on RA, 16 RR. 20 G R AC.

## 2017-05-07 NOTE — ED Notes (Signed)
Patient transported to CT 

## 2017-05-07 NOTE — ED Provider Notes (Signed)
[facility] DEPT Provider Note   CSN: [id number] Arrival date & time: 05/07/17  2137     History   Chief Complaint Chief Complaint  Patient presents with  . Chest Pain    HPI AISON MALVEAUX is a 76 y.o. male.  The history is provided by the patient.  Chest Pain   This is a new problem. The current episode started less than 1 hour ago. The problem occurs constantly. The problem has been gradually improving. The pain is associated with rest and breathing. The pain is present in the substernal region. The pain is severe. The quality of the pain is described as sharp and pressure-like. The pain radiates to the left jaw and right jaw. The symptoms are aggravated by deep breathing. Associated symptoms include diaphoresis and shortness of breath. Pertinent negatives include no abdominal pain, no back pain, no cough, no fever, no headaches, no leg pain, no lower extremity edema, no malaise/fatigue, no nausea and no vomiting. He has tried nitroglycerin for the symptoms. The treatment provided moderate relief. Risk factors include being elderly and male gender.  His past medical history is significant for hyperlipidemia.  Pertinent negatives for past medical history include no CAD, no diabetes, no DVT, no hypertension, no MI, no PE and no strokes.  Procedure history is positive for exercise treadmill test (4 yrs ago, negative).  Procedure history is negative for cardiac catheterization.   Recent trip to [place] last week.   Past Medical History:  Diagnosis Date  . Arthritis   . Blood in the stool   . Chicken pox   . Hay fever   . History of colonic polyps   . History of colonoscopy   . Hyperlipidemia     Patient Active Problem List   Diagnosis Date Noted  . Episode of generalized weakness 02/17/2016  . Weakness 12/09/2015  . HLD (hyperlipidemia) 12/09/2015  . GERD (gastroesophageal reflux disease) 10/03/2012  . Premature ventricular contraction 01/05/2011  . CHEST PAIN  03/19/2010  . OTHER PREMATURE BEATS 03/17/2010  . BRADYCARDIA 03/10/2010  . THROMBOCYTOPENIA 03/05/2010  . ORTHOSTATIC DIZZINESS 03/05/2010  . ADJ DISORDER WITH MIXED ANXIETY & DEPRESSED MOOD 09/08/2009  . MUSCLE STRAIN, HAMSTRING MUSCLE 09/08/2009  . HYPERLIPIDEMIA 01/13/2009  . COLONIC POLYPS, HX OF 01/13/2009    Past Surgical History:  Procedure Laterality Date  . ABDOMINAL ADHESION SURGERY  2007  . herniated diks surgery L-5  1996  . herniated disk surgery c 6-7  1986       Home Medications    Prior to Admission medications   Medication Sig Start Date End Date Taking? Authorizing Provider  aspirin 81 MG tablet Take 81 mg by mouth daily.   Yes [provider]  aspirin EC 325 MG tablet Take 325 mg by mouth once as needed (for chest pain).   Yes [provider]  citalopram (CELEXA) 20 MG tablet TAKE ONE TABLET BY MOUTH ONCE DAILY Patient taking differently: Take 20 mg by mouth once a day 03/07/17  Yes Burchette, Alinda Sierras, MD  desonide (DESOWEN) 0.05 % cream Apply 1 application topically daily as needed (for facial flares).    Yes [provider]  folic acid (FOLVITE) 1 MG tablet Take 1 mg by mouth daily.     Yes [provider]  meloxicam (MOBIC) 7.5 MG tablet Take 7.5 mg by mouth daily as needed (for shoulder pain).  02/17/17  Yes [provider]  Misc Natural Products (GLUCOS-CHONDROIT-MSM COMPLEX) TABS Take 1 tablet by  mouth 3 (three) times daily.   Yes [provider]  Omega-3 Fatty Acids (FISH OIL) 1200 MG CAPS Take 1,200 mg by mouth 2 (two) times daily.    Yes [provider]  simvastatin (ZOCOR) 20 MG tablet TAKE ONE TABLET BY MOUTH AT BEDTIME Patient taking differently: Take 20 mg by mouth at bedtime 03/07/17  Yes Burchette, Alinda Sierras, MD    Family History Family History  Problem Relation Age of Onset  . Alzheimer's disease Father 28  . Dementia Father   . Stroke Mother 60  . Cancer Brother        Leukemia    . Sudden death Other        family  . Hypertension Unknown   . Heart disease Neg Hx     Social History Social History  Substance Use Topics  . Smoking status: Former Smoker    Quit date: 08/09/1974  . Smokeless tobacco: Never Used  . Alcohol use No     Allergies   Patient has no known allergies.   Review of Systems Review of Systems  Constitutional: Positive for diaphoresis. Negative for fever and malaise/fatigue.  Respiratory: Positive for shortness of breath. Negative for cough.   Cardiovascular: Positive for chest pain.  Gastrointestinal: Negative for abdominal pain, nausea and vomiting.  Musculoskeletal: Negative for back pain.  Neurological: Negative for headaches.  All other systems are reviewed and are negative for acute change except as noted in the HPI    Physical Exam Updated Vital Signs BP (!) 113/53 (BP Location: Right Arm)   Pulse 62   Temp 98.1 F (36.7 C) (Oral)   Resp 10   Ht 5\' 8"  (1.727 m)   Wt 79.8 kg (176 lb)   SpO2 97%   BMI 26.76 kg/m   Physical Exam  Constitutional: He is oriented to person, place, and time. He appears well-developed and well-nourished. No distress.  HENT:  Head: Normocephalic and atraumatic.  Nose: Nose normal.  Eyes: Pupils are equal, round, and reactive to light. Conjunctivae and EOM are normal. Right eye exhibits no discharge. Left eye exhibits no discharge. No scleral icterus.  Neck: Normal range of motion. Neck supple.  Cardiovascular: Normal rate and regular rhythm.  Exam reveals no gallop and no friction rub.   No murmur heard. Pulmonary/Chest: Effort normal and breath sounds normal. No stridor. No respiratory distress. He has no rales.  Abdominal: Soft. He exhibits no distension. There is no tenderness.  Musculoskeletal: He exhibits no edema or tenderness.  Neurological: He is alert and oriented to person, place, and time.  Skin: Skin is warm and dry. No rash noted. He is not diaphoretic. No erythema.   Psychiatric: He has a normal mood and affect.  Vitals reviewed.    ED Treatments / Results  Labs (all labs ordered are listed, but only abnormal results are displayed) Labs Reviewed  BASIC METABOLIC PANEL - Abnormal; Notable for the following:       Result Value   Glucose, Bld 118 (*)    BUN 24 (*)    All other components within normal limits  CBC - Abnormal; Notable for the following:    Platelets 141 (*)    All other components within normal limits  D-DIMER, QUANTITATIVE (NOT AT [facility]) - Abnormal; Notable for the following:    D-Dimer, Quant 7.98 (*)    All other components within normal limits  I-STAT TROPONIN, ED    EKG  EKG Interpretation  Date/Time:  Saturday May 07 2017 21:46:21 EDT Ventricular Rate:  62 PR Interval:    QRS Duration: 119 QT Interval:  448 QTC Calculation: 455 R Axis:   85 Text Interpretation:  Sinus rhythm Nonspecific intraventricular conduction delay Baseline wander in lead(s) V2 V6 No significant change since last tracing Confirmed by Addison Lank [phone]) on 05/07/2017 9:54:20 PM       Radiology Dg Chest 2 View  Result Date: 05/07/2017 CLINICAL DATA:  Central chest pain. EXAM: CHEST  2 VIEW COMPARISON:  Radiograph 12/04/2015 FINDINGS: Mild chronic hyperinflation. The cardiomediastinal contours are unchanged with stable tortuosity of the thoracic aorta. The lungs are clear. Pulmonary vasculature is normal. No consolidation, pleural effusion, or pneumothorax. No acute osseous abnormalities are seen. IMPRESSION: Mild chronic hyperinflation without acute abnormality. Electronically Signed   By: Jeb Levering M.D.   On: 05/07/2017 22:55   Ct Angio Chest Pe W And/or Wo Contrast  Result Date: 05/08/2017 CLINICAL DATA:  Chest pain.  Shortness of breath.  Elevated D-dimer. EXAM: CT ANGIOGRAPHY CHEST WITH CONTRAST TECHNIQUE: Multidetector CT imaging of the chest was performed using the standard protocol during bolus administration of intravenous  contrast. Multiplanar CT image reconstructions and MIPs were obtained to evaluate the vascular anatomy. CONTRAST:  80 cc Isovue 370 IV COMPARISON:  Radiographs earlier this day FINDINGS: Cardiovascular: There are no filling defects within the pulmonary arteries to suggest pulmonary embolus. Aneurysmal dilatation of the ascending aorta, maximal dimension 5 cm in its midportion. There is question of mild periaortic soft tissue stranding. Descending aorta is normal in caliber. No intravenous contrast within the aorta to assess for dissection. There is contrast refluxing into the hepatic veins and IVC suggesting elevated right heart pressures. There are coronary artery calcifications. No definite pericardial fluid. Mediastinum/Nodes: Diffuse ill-defined soft tissue at the left hilum common tracking along the pulmonary vasculature an bronchi. No right hilar or mediastinal adenopathy. The esophagus is decompressed. Lungs/Pleura: Significant left bronchial thickening in the perihilar regions with ill-defined perihilar soft tissue density. Heterogeneous lung parenchyma on the left lower and upper lobes. Milder bronchial thickening in the right lung. 4 mm pleural nodule in the right upper lobe image 65 series 6. Scattered linear opacities throughout both lungs consistent with atelectasis. No pleural fluid. No definite pulmonary edema. Upper Abdomen: Contrast refluxing into the hepatic veins and IVC. Tiny hiatal hernia. Musculoskeletal: Thoracic scoliosis. No blastic or destructive lytic lesions. Review of the MIP images confirms the above findings. IMPRESSION: 1. Thoracic aortic aneurysm measuring 5 cm, with questionable periaortic soft tissue stranding. The aorta is unopacified, recommend repeat exam to evaluate for acute aortic pathology such as dissection. 2. No pulmonary embolus. 3. Contrast refluxing into the hepatic veins and IVC consistent with elevated right heart pressures. Coronary artery calcifications. 4.  Ill-defined left perihilar soft tissue tracking along the pulmonary vessels with bronchial thickening. No well-defined hilar mass or adenopathy, underlying etiology is uncertain. This may be inflammatory, however neoplastic process is also considered. Recommend follow-up PET-CT. 5. Noncalcified 4 mm pleural-based nodule in the right upper lobe, nonspecific. Findings discussed with Dr. Leonette Monarch. Aortic aneurysm NOS (ICD10-I71.9). Electronically Signed   By: Jeb Levering M.D.   On: 05/08/2017 00:40    Procedures Procedures (including critical care time)  Medications Ordered in ED Medications  nitroGLYCERIN (NITROSTAT) SL tablet 0.4 mg (not administered)  sodium chloride 0.9 % bolus 1,000 mL (0 mLs Intravenous Stopped 05/07/17 2336)  sodium chloride 0.9 % bolus 1,000 mL (1,000 mLs Intravenous New Bag/Given 05/08/17 0012)  iopamidol (ISOVUE-370)  76 % injection (80 mLs  Contrast Given 05/07/17 2348)  gi cocktail (Maalox,Lidocaine,Donnatal) (30 mLs Oral Given 05/08/17 0025)     Initial Impression / Assessment and Plan / ED Course  I have reviewed the triage vital signs and the nursing notes.  Pertinent labs & imaging results that were available during my care of the patient were reviewed by me and considered in my medical decision making (see chart for details).  Atypical chest pain. EKG without acute ischemic changes or evidence of pericarditis. Initial troponin negative. Symptoms did improve with nitroglycerin and radiate to the jaw. If rest of the workup is negative patient should be admitted for ACS rule out.  Low pretest probability for pulmonary embolism but patient had recent travel with prolonged immobilization and pain is pleuritic in nature. D-dimer obtained which was significantly elevated. CTA of the chest was obtained to rule out pulmonary embolism. It did not reveal a PE however it did note a dilated aortic root concerning for dissection. Though presentation is not classic for  dissection, given the findings will need to obtain a CT angiogram to assess the aorta.  Patient was informed of all these findings and need for repeat imaging.  Patient care turned over to Dr Betsey Holiday at 0100. Patient case and results discussed in detail; please see their note for further ED managment.       Fatima Blank, MD 05/09/17 [phone]

## 2017-05-08 ENCOUNTER — Encounter (HOSPITAL_COMMUNITY)
Admission: EM | Disposition: A | Discharge status: Home / Self Care | Payer: Self-pay | Source: Home / Self Care | Attending: Cardiothoracic Surgery

## 2017-05-08 ENCOUNTER — Emergency Department (HOSPITAL_COMMUNITY): Payer: Medicare HMO | Admitting: Anesthesiology

## 2017-05-08 ENCOUNTER — Emergency Department (HOSPITAL_COMMUNITY): Payer: Medicare HMO

## 2017-05-08 ENCOUNTER — Encounter (HOSPITAL_COMMUNITY): Payer: Self-pay | Admitting: Anesthesiology

## 2017-05-08 ENCOUNTER — Inpatient Hospital Stay (HOSPITAL_COMMUNITY): Payer: Medicare HMO

## 2017-05-08 DIAGNOSIS — I340 Nonrheumatic mitral (valve) insufficiency: Secondary | ICD-10-CM | POA: Diagnosis not present

## 2017-05-08 DIAGNOSIS — I82612 Acute embolism and thrombosis of superficial veins of left upper extremity: Secondary | ICD-10-CM | POA: Diagnosis not present

## 2017-05-08 DIAGNOSIS — E8770 Fluid overload, unspecified: Secondary | ICD-10-CM | POA: Diagnosis not present

## 2017-05-08 DIAGNOSIS — I82621 Acute embolism and thrombosis of deep veins of right upper extremity: Secondary | ICD-10-CM | POA: Diagnosis not present

## 2017-05-08 DIAGNOSIS — R7303 Prediabetes: Secondary | ICD-10-CM | POA: Diagnosis present

## 2017-05-08 DIAGNOSIS — Z79899 Other long term (current) drug therapy: Secondary | ICD-10-CM | POA: Diagnosis not present

## 2017-05-08 DIAGNOSIS — I351 Nonrheumatic aortic (valve) insufficiency: Secondary | ICD-10-CM | POA: Diagnosis not present

## 2017-05-08 DIAGNOSIS — Z87891 Personal history of nicotine dependence: Secondary | ICD-10-CM | POA: Diagnosis not present

## 2017-05-08 DIAGNOSIS — M1991 Primary osteoarthritis, unspecified site: Secondary | ICD-10-CM | POA: Diagnosis not present

## 2017-05-08 DIAGNOSIS — I959 Hypotension, unspecified: Secondary | ICD-10-CM | POA: Diagnosis not present

## 2017-05-08 DIAGNOSIS — J9811 Atelectasis: Secondary | ICD-10-CM | POA: Diagnosis not present

## 2017-05-08 DIAGNOSIS — M549 Dorsalgia, unspecified: Secondary | ICD-10-CM | POA: Diagnosis not present

## 2017-05-08 DIAGNOSIS — I480 Paroxysmal atrial fibrillation: Secondary | ICD-10-CM | POA: Diagnosis not present

## 2017-05-08 DIAGNOSIS — Z4682 Encounter for fitting and adjustment of non-vascular catheter: Secondary | ICD-10-CM | POA: Diagnosis not present

## 2017-05-08 DIAGNOSIS — E785 Hyperlipidemia, unspecified: Secondary | ICD-10-CM | POA: Diagnosis not present

## 2017-05-08 DIAGNOSIS — I82622 Acute embolism and thrombosis of deep veins of left upper extremity: Secondary | ICD-10-CM | POA: Diagnosis not present

## 2017-05-08 DIAGNOSIS — I714 Abdominal aortic aneurysm, without rupture: Secondary | ICD-10-CM | POA: Diagnosis not present

## 2017-05-08 DIAGNOSIS — R0902 Hypoxemia: Secondary | ICD-10-CM | POA: Diagnosis not present

## 2017-05-08 DIAGNOSIS — I7101 Dissection of thoracic aorta: Secondary | ICD-10-CM | POA: Diagnosis not present

## 2017-05-08 DIAGNOSIS — I82C11 Acute embolism and thrombosis of right internal jugular vein: Secondary | ICD-10-CM | POA: Diagnosis not present

## 2017-05-08 DIAGNOSIS — M7989 Other specified soft tissue disorders: Secondary | ICD-10-CM | POA: Diagnosis not present

## 2017-05-08 DIAGNOSIS — D62 Acute posthemorrhagic anemia: Secondary | ICD-10-CM | POA: Diagnosis not present

## 2017-05-08 DIAGNOSIS — Z9889 Other specified postprocedural states: Secondary | ICD-10-CM

## 2017-05-08 DIAGNOSIS — R7989 Other specified abnormal findings of blood chemistry: Secondary | ICD-10-CM | POA: Diagnosis not present

## 2017-05-08 DIAGNOSIS — Z23 Encounter for immunization: Secondary | ICD-10-CM | POA: Diagnosis not present

## 2017-05-08 DIAGNOSIS — D696 Thrombocytopenia, unspecified: Secondary | ICD-10-CM | POA: Diagnosis not present

## 2017-05-08 DIAGNOSIS — Z7982 Long term (current) use of aspirin: Secondary | ICD-10-CM | POA: Diagnosis not present

## 2017-05-08 DIAGNOSIS — J90 Pleural effusion, not elsewhere classified: Secondary | ICD-10-CM | POA: Diagnosis not present

## 2017-05-08 DIAGNOSIS — I700 Atherosclerosis of aorta: Secondary | ICD-10-CM | POA: Diagnosis not present

## 2017-05-08 DIAGNOSIS — R079 Chest pain, unspecified: Secondary | ICD-10-CM | POA: Diagnosis not present

## 2017-05-08 DIAGNOSIS — I10 Essential (primary) hypertension: Secondary | ICD-10-CM | POA: Diagnosis not present

## 2017-05-08 HISTORY — PX: REPLACEMENT ASCENDING AORTA: SHX6068

## 2017-05-08 HISTORY — PX: ENDOVASCULAR STENT GRAFT (AAA): CATH118280

## 2017-05-08 HISTORY — PX: ASCENDING AORTIC ROOT REPLACEMENT: SHX5729

## 2017-05-08 HISTORY — PX: INTRAOPERATIVE TRANSESOPHAGEAL ECHOCARDIOGRAM: SHX5062

## 2017-05-08 HISTORY — PX: CANNULATION FOR CARDIOPULMONARY BYPASS: SHX6411

## 2017-05-08 HISTORY — PX: AORTIC INTERVENTION: CATH118225

## 2017-05-08 HISTORY — DX: Other specified postprocedural states: Z98.890

## 2017-05-08 HISTORY — PX: THORACIC AORTIC ENDOVASCULAR STENT GRAFT: SHX6112

## 2017-05-08 LAB — POCT I-STAT, CHEM 8
BUN: 23 mg/dL — ABNORMAL HIGH (ref 6–20)
BUN: 22 mg/dL — ABNORMAL HIGH (ref 6–20)
BUN: 23 mg/dL — ABNORMAL HIGH (ref 6–20)
BUN: 21 mg/dL — ABNORMAL HIGH (ref 6–20)
BUN: 24 mg/dL — ABNORMAL HIGH (ref 6–20)
BUN: 24 mg/dL — ABNORMAL HIGH (ref 6–20)
BUN: 24 mg/dL — ABNORMAL HIGH (ref 6–20)
BUN: 20 mg/dL (ref 6–20)
Calcium, Ion: 1.15 mmol/L (ref 1.15–1.40)
Calcium, Ion: 0.97 mmol/L — ABNORMAL LOW (ref 1.15–1.40)
Calcium, Ion: 1 mmol/L — ABNORMAL LOW (ref 1.15–1.40)
Calcium, Ion: 0.81 mmol/L — CL (ref 1.15–1.40)
Calcium, Ion: 0.78 mmol/L — CL (ref 1.15–1.40)
Calcium, Ion: 0.77 mmol/L — CL (ref 1.15–1.40)
Calcium, Ion: 0.68 mmol/L — CL (ref 1.15–1.40)
Calcium, Ion: 1.1 mmol/L — ABNORMAL LOW (ref 1.15–1.40)
Chloride: 108 mmol/L (ref 101–111)
Chloride: 104 mmol/L (ref 101–111)
Chloride: 106 mmol/L (ref 101–111)
Chloride: 107 mmol/L (ref 101–111)
Chloride: 103 mmol/L (ref 101–111)
Chloride: 102 mmol/L (ref 101–111)
Chloride: 102 mmol/L (ref 101–111)
Chloride: 106 mmol/L (ref 101–111)
Creatinine, Ser: 0.9 mg/dL (ref 0.61–1.24)
Creatinine, Ser: 0.7 mg/dL (ref 0.61–1.24)
Creatinine, Ser: 0.8 mg/dL (ref 0.61–1.24)
Creatinine, Ser: 0.9 mg/dL (ref 0.61–1.24)
Creatinine, Ser: 0.9 mg/dL (ref 0.61–1.24)
Creatinine, Ser: 0.9 mg/dL (ref 0.61–1.24)
Creatinine, Ser: 0.9 mg/dL (ref 0.61–1.24)
Creatinine, Ser: 1.2 mg/dL (ref 0.61–1.24)
Glucose, Bld: 129 mg/dL — ABNORMAL HIGH (ref 65–99)
Glucose, Bld: 104 mg/dL — ABNORMAL HIGH (ref 65–99)
Glucose, Bld: 82 mg/dL (ref 65–99)
Glucose, Bld: 60 mg/dL — ABNORMAL LOW (ref 65–99)
Glucose, Bld: 189 mg/dL — ABNORMAL HIGH (ref 65–99)
Glucose, Bld: 187 mg/dL — ABNORMAL HIGH (ref 65–99)
Glucose, Bld: 205 mg/dL — ABNORMAL HIGH (ref 65–99)
Glucose, Bld: 156 mg/dL — ABNORMAL HIGH (ref 65–99)
HCT: 31 % — ABNORMAL LOW (ref 39.0–52.0)
HCT: 28 % — ABNORMAL LOW (ref 39.0–52.0)
HCT: 30 % — ABNORMAL LOW (ref 39.0–52.0)
HCT: 19 % — ABNORMAL LOW (ref 39.0–52.0)
HCT: 26 % — ABNORMAL LOW (ref 39.0–52.0)
HCT: 26 % — ABNORMAL LOW (ref 39.0–52.0)
HCT: 28 % — ABNORMAL LOW (ref 39.0–52.0)
HCT: 24 % — ABNORMAL LOW (ref 39.0–52.0)
Hemoglobin: 10.5 g/dL — ABNORMAL LOW (ref 13.0–17.0)
Hemoglobin: 9.5 g/dL — ABNORMAL LOW (ref 13.0–17.0)
Hemoglobin: 10.2 g/dL — ABNORMAL LOW (ref 13.0–17.0)
Hemoglobin: 6.5 g/dL — CL (ref 13.0–17.0)
Hemoglobin: 8.8 g/dL — ABNORMAL LOW (ref 13.0–17.0)
Hemoglobin: 8.8 g/dL — ABNORMAL LOW (ref 13.0–17.0)
Hemoglobin: 9.5 g/dL — ABNORMAL LOW (ref 13.0–17.0)
Hemoglobin: 8.2 g/dL — ABNORMAL LOW (ref 13.0–17.0)
Potassium: 5 mmol/L (ref 3.5–5.1)
Potassium: 5.1 mmol/L (ref 3.5–5.1)
Potassium: 4.8 mmol/L (ref 3.5–5.1)
Potassium: 6 mmol/L — ABNORMAL HIGH (ref 3.5–5.1)
Potassium: 3.6 mmol/L (ref 3.5–5.1)
Potassium: 5 mmol/L (ref 3.5–5.1)
Potassium: 5.5 mmol/L — ABNORMAL HIGH (ref 3.5–5.1)
Potassium: 3.7 mmol/L (ref 3.5–5.1)
Sodium: 138 mmol/L (ref 135–145)
Sodium: 140 mmol/L (ref 135–145)
Sodium: 136 mmol/L (ref 135–145)
Sodium: 141 mmol/L (ref 135–145)
Sodium: 142 mmol/L (ref 135–145)
Sodium: 142 mmol/L (ref 135–145)
Sodium: 141 mmol/L (ref 135–145)
Sodium: 143 mmol/L (ref 135–145)
TCO2: 25 mmol/L (ref 22–32)
TCO2: 27 mmol/L (ref 22–32)
TCO2: 25 mmol/L (ref 22–32)
TCO2: 21 mmol/L — ABNORMAL LOW (ref 22–32)
TCO2: 20 mmol/L — ABNORMAL LOW (ref 22–32)
TCO2: 26 mmol/L (ref 22–32)
TCO2: 27 mmol/L (ref 22–32)
TCO2: 25 mmol/L (ref 22–32)

## 2017-05-08 LAB — POCT I-STAT 3, ART BLOOD GAS (G3+)
Acid-Base Excess: 3 mmol/L — ABNORMAL HIGH (ref 0.0–2.0)
Acid-Base Excess: 4 mmol/L — ABNORMAL HIGH (ref 0.0–2.0)
Acid-Base Excess: 2 mmol/L (ref 0.0–2.0)
Acid-base deficit: 10 mmol/L — ABNORMAL HIGH (ref 0.0–2.0)
Bicarbonate: 28.2 mmol/L — ABNORMAL HIGH (ref 20.0–28.0)
Bicarbonate: 19.2 mmol/L — ABNORMAL LOW (ref 20.0–28.0)
Bicarbonate: 25.3 mmol/L (ref 20.0–28.0)
Bicarbonate: 29.5 mmol/L — ABNORMAL HIGH (ref 20.0–28.0)
Bicarbonate: 25.9 mmol/L (ref 20.0–28.0)
O2 Saturation: 100 %
O2 Saturation: 97 %
O2 Saturation: 100 %
O2 Saturation: 100 %
O2 Saturation: 98 %
Patient temperature: 35.6
TCO2: 30 mmol/L (ref 22–32)
TCO2: 21 mmol/L — ABNORMAL LOW (ref 22–32)
TCO2: 27 mmol/L (ref 22–32)
TCO2: 31 mmol/L (ref 22–32)
TCO2: 27 mmol/L (ref 22–32)
pCO2 arterial: 45.1 mmHg (ref 32.0–48.0)
pCO2 arterial: 65.7 mmHg (ref 32.0–48.0)
pCO2 arterial: 41.7 mmHg (ref 32.0–48.0)
pCO2 arterial: 46.3 mmHg (ref 32.0–48.0)
pCO2 arterial: 32.5 mmHg (ref 32.0–48.0)
pH, Arterial: 7.404 (ref 7.350–7.450)
pH, Arterial: 7.074 — CL (ref 7.350–7.450)
pH, Arterial: 7.39 (ref 7.350–7.450)
pH, Arterial: 7.412 (ref 7.350–7.450)
pH, Arterial: 7.504 — ABNORMAL HIGH (ref 7.350–7.450)
pO2, Arterial: 489 mmHg — ABNORMAL HIGH (ref 83.0–108.0)
pO2, Arterial: 125 mmHg — ABNORMAL HIGH (ref 83.0–108.0)
pO2, Arterial: 277 mmHg — ABNORMAL HIGH (ref 83.0–108.0)
pO2, Arterial: 420 mmHg — ABNORMAL HIGH (ref 83.0–108.0)
pO2, Arterial: 82 mmHg — ABNORMAL LOW (ref 83.0–108.0)

## 2017-05-08 LAB — CBC
HCT: 31.8 % — ABNORMAL LOW (ref 39.0–52.0)
HCT: 24.4 % — ABNORMAL LOW (ref 39.0–52.0)
Hemoglobin: 10.9 g/dL — ABNORMAL LOW (ref 13.0–17.0)
Hemoglobin: 8.6 g/dL — ABNORMAL LOW (ref 13.0–17.0)
MCH: 30.5 pg (ref 26.0–34.0)
MCH: 30.5 pg (ref 26.0–34.0)
MCHC: 34.3 g/dL (ref 30.0–36.0)
MCHC: 35.2 g/dL (ref 30.0–36.0)
MCV: 89.1 fL (ref 78.0–100.0)
MCV: 86.5 fL (ref 78.0–100.0)
PLATELETS: 138 10*3/uL — AB (ref 150–400)
Platelets: 133 10*3/uL — ABNORMAL LOW (ref 150–400)
RBC: 3.57 MIL/uL — ABNORMAL LOW (ref 4.22–5.81)
RBC: 2.82 MIL/uL — ABNORMAL LOW (ref 4.22–5.81)
RDW: 14.1 % (ref 11.5–15.5)
RDW: 14.4 % (ref 11.5–15.5)
WBC: 8 10*3/uL (ref 4.0–10.5)
WBC: 7.4 10*3/uL (ref 4.0–10.5)

## 2017-05-08 LAB — PROTIME-INR
INR: 1.04
INR: 1.05
PROTHROMBIN TIME: 13.5 s (ref 11.4–15.2)
Prothrombin Time: 13.6 seconds (ref 11.4–15.2)

## 2017-05-08 LAB — GLUCOSE, CAPILLARY
Glucose-Capillary: 87 mg/dL (ref 65–99)
Glucose-Capillary: 82 mg/dL (ref 65–99)
Glucose-Capillary: 118 mg/dL — ABNORMAL HIGH (ref 65–99)
Glucose-Capillary: 123 mg/dL — ABNORMAL HIGH (ref 65–99)

## 2017-05-08 LAB — MAGNESIUM: Magnesium: 2.9 mg/dL — ABNORMAL HIGH (ref 1.7–2.4)

## 2017-05-08 LAB — APTT
APTT: 45 s — AB (ref 24–36)
aPTT: 39 seconds — ABNORMAL HIGH (ref 24–36)

## 2017-05-08 LAB — PLATELET COUNT
Platelets: 41 10*3/uL — ABNORMAL LOW (ref 150–400)
Platelets: 183 10*3/uL (ref 150–400)

## 2017-05-08 LAB — FIBRINOGEN
Fibrinogen: 101 mg/dL — ABNORMAL LOW (ref 210–475)
Fibrinogen: 240 mg/dL (ref 210–475)

## 2017-05-08 LAB — HEMOGLOBIN AND HEMATOCRIT, BLOOD
HCT: 27.3 % — ABNORMAL LOW (ref 39.0–52.0)
Hemoglobin: 9.4 g/dL — ABNORMAL LOW (ref 13.0–17.0)

## 2017-05-08 LAB — ABO/RH: ABO/RH(D): A POS

## 2017-05-08 LAB — PREPARE RBC (CROSSMATCH)

## 2017-05-08 SURGERY — ASCENDING AORTIC ROOT REPLACEMENT
Anesthesia: General | Site: Chest | Laterality: Right

## 2017-05-08 MED ORDER — CITALOPRAM HYDROBROMIDE 20 MG PO TABS
20.0000 mg | ORAL_TABLET | Freq: Every day | ORAL | Status: DC
  Administered 2017-05-09 – 2017-05-20 (×12): 20 mg via ORAL
  Filled 2017-05-08 (×12): qty 1

## 2017-05-08 MED ORDER — PROPOFOL 10 MG/ML IV BOLUS
INTRAVENOUS | Status: DC | PRN
  Administered 2017-05-08: 100 mg via INTRAVENOUS
  Administered 2017-05-08: 50 mg via INTRAVENOUS

## 2017-05-08 MED ORDER — MORPHINE SULFATE (PF) 2 MG/ML IV SOLN: 2.0000 mg | INTRAVENOUS | Status: DC | PRN

## 2017-05-08 MED ORDER — MILRINONE LACTATE IN DEXTROSE 20-5 MG/100ML-% IV SOLN
0.1250 ug/kg/min | INTRAVENOUS | Status: DC
  Filled 2017-05-08: qty 100

## 2017-05-08 MED ORDER — SODIUM CHLORIDE 0.9 % IV SOLN
INTRAVENOUS | Status: DC | PRN
  Administered 2017-05-08: 11:00:00

## 2017-05-08 MED ORDER — DOPAMINE-DEXTROSE 3.2-5 MG/ML-% IV SOLN: 0.0000 ug/kg/min | INTRAVENOUS | Status: DC

## 2017-05-08 MED ORDER — PROPOFOL 10 MG/ML IV BOLUS
INTRAVENOUS | Status: AC
  Filled 2017-05-08: qty 20

## 2017-05-08 MED ORDER — ASPIRIN 81 MG PO CHEW
324.0000 mg | CHEWABLE_TABLET | Freq: Every day | ORAL | Status: DC
  Administered 2017-05-09: 324 mg
  Filled 2017-05-08: qty 4

## 2017-05-08 MED ORDER — SODIUM CHLORIDE 0.9 % IV SOLN: 250.0000 mL | INTRAVENOUS | Status: DC

## 2017-05-08 MED ORDER — PHENYLEPHRINE 40 MCG/ML (10ML) SYRINGE FOR IV PUSH (FOR BLOOD PRESSURE SUPPORT)
PREFILLED_SYRINGE | INTRAVENOUS | Status: AC
  Filled 2017-05-08: qty 30

## 2017-05-08 MED ORDER — DOPAMINE-DEXTROSE 3.2-5 MG/ML-% IV SOLN
0.0000 ug/kg/min | INTRAVENOUS | Status: DC
  Filled 2017-05-08 (×2): qty 250

## 2017-05-08 MED ORDER — CALCIUM CHLORIDE 10 % IV SOLN
INTRAVENOUS | Status: DC | PRN
  Administered 2017-05-08 (×2): 1 g via INTRAVENOUS

## 2017-05-08 MED ORDER — MIDAZOLAM HCL 2 MG/2ML IJ SOLN
2.0000 mg | INTRAMUSCULAR | Status: DC | PRN
  Administered 2017-05-08 – 2017-05-11 (×18): 2 mg via INTRAVENOUS
  Filled 2017-05-08 (×20): qty 2

## 2017-05-08 MED ORDER — POTASSIUM CHLORIDE 10 MEQ/50ML IV SOLN
10.0000 meq | INTRAVENOUS | Status: DC | PRN
  Administered 2017-05-08 – 2017-05-09 (×2): 10 meq via INTRAVENOUS
  Filled 2017-05-08: qty 50

## 2017-05-08 MED ORDER — PROTAMINE SULFATE 10 MG/ML IV SOLN
INTRAVENOUS | Status: DC | PRN
  Administered 2017-05-08: 280 mg via INTRAVENOUS

## 2017-05-08 MED ORDER — TRAMADOL HCL 50 MG PO TABS
50.0000 mg | ORAL_TABLET | ORAL | Status: DC | PRN
  Administered 2017-05-12: 50 mg via ORAL
  Filled 2017-05-08: qty 1

## 2017-05-08 MED ORDER — ALBUTEROL SULFATE HFA 108 (90 BASE) MCG/ACT IN AERS
INHALATION_SPRAY | RESPIRATORY_TRACT | Status: DC | PRN
  Administered 2017-05-08 (×2): 4 via RESPIRATORY_TRACT

## 2017-05-08 MED ORDER — MIDAZOLAM HCL 10 MG/2ML IJ SOLN
INTRAMUSCULAR | Status: AC
  Filled 2017-05-08: qty 2

## 2017-05-08 MED ORDER — ACETAMINOPHEN 500 MG PO TABS
1000.0000 mg | ORAL_TABLET | Freq: Four times a day (QID) | ORAL | Status: AC
  Administered 2017-05-11 – 2017-05-13 (×9): 1000 mg via ORAL
  Filled 2017-05-08 (×10): qty 2

## 2017-05-08 MED ORDER — DEXTROSE 5 % IV SOLN
750.0000 mg | INTRAVENOUS | Status: DC
  Filled 2017-05-08: qty 750

## 2017-05-08 MED ORDER — OXYCODONE HCL 5 MG PO TABS
5.0000 mg | ORAL_TABLET | ORAL | Status: DC | PRN
  Administered 2017-05-12 (×2): 10 mg via ORAL
  Filled 2017-05-08 (×2): qty 2

## 2017-05-08 MED ORDER — SODIUM CHLORIDE 0.9 % IV SOLN
INTRAVENOUS | Status: DC
  Filled 2017-05-08: qty 1

## 2017-05-08 MED ORDER — ETOMIDATE 2 MG/ML IV SOLN
INTRAVENOUS | Status: DC | PRN
  Administered 2017-05-08: 20 mg via INTRAVENOUS

## 2017-05-08 MED ORDER — CALCIUM CHLORIDE 10 % IV SOLN
INTRAVENOUS | Status: AC
  Filled 2017-05-08: qty 10

## 2017-05-08 MED ORDER — METOPROLOL TARTRATE 25 MG/10 ML ORAL SUSPENSION
12.5000 mg | Freq: Two times a day (BID) | ORAL | Status: DC
  Administered 2017-05-11: 12.5 mg
  Filled 2017-05-08: qty 5

## 2017-05-08 MED ORDER — SODIUM CHLORIDE 0.45 % IV SOLN
INTRAVENOUS | Status: DC | PRN
  Administered 2017-05-08: 17:00:00 via INTRAVENOUS

## 2017-05-08 MED ORDER — INSULIN REGULAR BOLUS VIA INFUSION
0.0000 [IU] | Freq: Three times a day (TID) | INTRAVENOUS | Status: DC
  Filled 2017-05-08: qty 10

## 2017-05-08 MED ORDER — MIDAZOLAM HCL 2 MG/2ML IJ SOLN
INTRAMUSCULAR | Status: AC
  Filled 2017-05-08: qty 2

## 2017-05-08 MED ORDER — COAGULATION FACTOR VIIA RECOMB 1 MG IV SOLR
INTRAVENOUS | Status: DC | PRN
  Administered 2017-05-08 (×2): 2 mg via INTRAVENOUS

## 2017-05-08 MED ORDER — EPINEPHRINE PF 1 MG/ML IJ SOLN
0.0000 ug/min | INTRAVENOUS | Status: DC
  Filled 2017-05-08: qty 4

## 2017-05-08 MED ORDER — COAGULATION FACTOR VIIA RECOMB 1 MG IV SOLR
45.0000 ug/kg | Freq: Once | INTRAVENOUS | Status: DC
  Filled 2017-05-08: qty 4

## 2017-05-08 MED ORDER — PROTAMINE SULFATE 10 MG/ML IV SOLN
INTRAVENOUS | Status: AC
  Filled 2017-05-08: qty 25

## 2017-05-08 MED ORDER — ALBUTEROL SULFATE HFA 108 (90 BASE) MCG/ACT IN AERS
INHALATION_SPRAY | RESPIRATORY_TRACT | Status: AC
  Filled 2017-05-08: qty 6.7

## 2017-05-08 MED ORDER — MORPHINE SULFATE (PF) 4 MG/ML IV SOLN
2.0000 mg | INTRAVENOUS | Status: DC | PRN
  Administered 2017-05-09 – 2017-05-10 (×5): 4 mg via INTRAVENOUS
  Administered 2017-05-11 (×2): 2 mg via INTRAVENOUS
  Filled 2017-05-08 (×7): qty 1

## 2017-05-08 MED ORDER — METOPROLOL TARTRATE 12.5 MG HALF TABLET: 12.5000 mg | ORAL_TABLET | Freq: Once | ORAL | Status: DC

## 2017-05-08 MED ORDER — ALBUMIN HUMAN 5 % IV SOLN
250.0000 mL | INTRAVENOUS | Status: AC | PRN
  Administered 2017-05-08 (×4): 250 mL via INTRAVENOUS
  Filled 2017-05-08 (×2): qty 250

## 2017-05-08 MED ORDER — MAGNESIUM SULFATE 4 GM/100ML IV SOLN
4.0000 g | Freq: Once | INTRAVENOUS | Status: AC
  Administered 2017-05-08: 4 g via INTRAVENOUS
  Filled 2017-05-08: qty 100

## 2017-05-08 MED ORDER — ASPIRIN EC 325 MG PO TBEC: 325.0000 mg | DELAYED_RELEASE_TABLET | Freq: Every day | ORAL | Status: DC

## 2017-05-08 MED ORDER — PHENYLEPHRINE HCL 10 MG/ML IJ SOLN
INTRAMUSCULAR | Status: DC | PRN
  Administered 2017-05-08: 80 ug via INTRAVENOUS

## 2017-05-08 MED ORDER — MORPHINE SULFATE (PF) 4 MG/ML IV SOLN: 1.0000 mg | INTRAVENOUS | Status: AC | PRN

## 2017-05-08 MED ORDER — INSULIN REGULAR HUMAN 100 UNIT/ML IJ SOLN
INTRAMUSCULAR | Status: AC
  Administered 2017-05-08: 1 [IU]/h via INTRAVENOUS
  Filled 2017-05-08: qty 1

## 2017-05-08 MED ORDER — SODIUM CHLORIDE 0.9 % IV SOLN
INTRAVENOUS | Status: DC | PRN
  Administered 2017-05-08: 14:00:00 via INTRAVENOUS

## 2017-05-08 MED ORDER — METOPROLOL TARTRATE 12.5 MG HALF TABLET
12.5000 mg | ORAL_TABLET | Freq: Two times a day (BID) | ORAL | Status: DC
  Administered 2017-05-11 – 2017-05-13 (×4): 12.5 mg via ORAL
  Filled 2017-05-08 (×4): qty 1

## 2017-05-08 MED ORDER — FENTANYL CITRATE (PF) 250 MCG/5ML IJ SOLN
INTRAMUSCULAR | Status: DC | PRN
  Administered 2017-05-08: 250 ug via INTRAVENOUS
  Administered 2017-05-08: 150 ug via INTRAVENOUS
  Administered 2017-05-08: 250 ug via INTRAVENOUS
  Administered 2017-05-08: 100 ug via INTRAVENOUS
  Administered 2017-05-08: 50 ug via INTRAVENOUS
  Administered 2017-05-08: 250 ug via INTRAVENOUS
  Administered 2017-05-08: 150 ug via INTRAVENOUS
  Administered 2017-05-08 (×2): 100 ug via INTRAVENOUS

## 2017-05-08 MED ORDER — DEXTROSE 5 % IV SOLN
1.5000 g | INTRAVENOUS | Status: AC
  Administered 2017-05-08: 1.5 g via INTRAVENOUS
  Administered 2017-05-08: .75 g via INTRAVENOUS
  Filled 2017-05-08: qty 1.5

## 2017-05-08 MED ORDER — METOCLOPRAMIDE HCL 5 MG/ML IJ SOLN
10.0000 mg | Freq: Four times a day (QID) | INTRAMUSCULAR | Status: AC
  Administered 2017-05-08 – 2017-05-09 (×4): 10 mg via INTRAVENOUS
  Filled 2017-05-08 (×4): qty 2

## 2017-05-08 MED ORDER — ETOMIDATE 2 MG/ML IV SOLN
INTRAVENOUS | Status: AC
  Filled 2017-05-08: qty 10

## 2017-05-08 MED ORDER — SIMVASTATIN 20 MG PO TABS
20.0000 mg | ORAL_TABLET | Freq: Every day | ORAL | Status: DC
  Administered 2017-05-08 – 2017-05-19 (×12): 20 mg via ORAL
  Filled 2017-05-08 (×12): qty 1

## 2017-05-08 MED ORDER — CHLORHEXIDINE GLUCONATE 0.12 % MT SOLN: 15.0000 mL | Freq: Once | OROMUCOSAL | Status: AC

## 2017-05-08 MED ORDER — DOCUSATE SODIUM 100 MG PO CAPS: 200.0000 mg | ORAL_CAPSULE | Freq: Every day | ORAL | Status: DC

## 2017-05-08 MED ORDER — SODIUM CHLORIDE 0.9 % IV SOLN
INTRAVENOUS | Status: DC
  Filled 2017-05-08: qty 30

## 2017-05-08 MED ORDER — DEXTROSE 50 % IV SOLN
INTRAVENOUS | Status: DC | PRN
  Administered 2017-05-08: 16 mL via INTRAVENOUS

## 2017-05-08 MED ORDER — BISACODYL 10 MG RE SUPP
10.0000 mg | Freq: Every day | RECTAL | Status: DC
  Administered 2017-05-09: 10 mg via RECTAL
  Filled 2017-05-08: qty 1

## 2017-05-08 MED ORDER — HEPARIN SODIUM (PORCINE) 1000 UNIT/ML IJ SOLN
INTRAMUSCULAR | Status: DC | PRN
  Administered 2017-05-08: 28000 [IU] via INTRAVENOUS

## 2017-05-08 MED ORDER — POTASSIUM CHLORIDE 10 MEQ/50ML IV SOLN: 10.0000 meq | INTRAVENOUS | Status: AC

## 2017-05-08 MED ORDER — ONDANSETRON HCL 4 MG/2ML IJ SOLN: 4.0000 mg | Freq: Four times a day (QID) | INTRAMUSCULAR | Status: DC | PRN

## 2017-05-08 MED ORDER — SODIUM CHLORIDE 0.9 % IV SOLN: Freq: Once | INTRAVENOUS | Status: DC

## 2017-05-08 MED ORDER — SODIUM CHLORIDE 0.9 % IV SOLN
30.0000 ug/min | INTRAVENOUS | Status: AC
  Administered 2017-05-08: 20 ug/min via INTRAVENOUS
  Filled 2017-05-08: qty 2

## 2017-05-08 MED ORDER — ROCURONIUM BROMIDE 10 MG/ML (PF) SYRINGE
PREFILLED_SYRINGE | INTRAVENOUS | Status: AC
  Filled 2017-05-08: qty 30

## 2017-05-08 MED ORDER — EPINEPHRINE PF 1 MG/ML IJ SOLN
0.5000 ug/min | INTRAMUSCULAR | Status: DC
  Filled 2017-05-08: qty 4

## 2017-05-08 MED ORDER — SUCCINYLCHOLINE CHLORIDE 200 MG/10ML IV SOSY
PREFILLED_SYRINGE | INTRAVENOUS | Status: AC
  Filled 2017-05-08: qty 10

## 2017-05-08 MED ORDER — LACTATED RINGERS IV SOLN: 500.0000 mL | Freq: Once | INTRAVENOUS | Status: DC | PRN

## 2017-05-08 MED ORDER — SODIUM CHLORIDE 0.9% FLUSH
3.0000 mL | Freq: Two times a day (BID) | INTRAVENOUS | Status: DC
  Administered 2017-05-09 – 2017-05-13 (×8): 3 mL via INTRAVENOUS

## 2017-05-08 MED ORDER — FAMOTIDINE IN NACL 20-0.9 MG/50ML-% IV SOLN
20.0000 mg | Freq: Two times a day (BID) | INTRAVENOUS | Status: AC
  Administered 2017-05-08 (×2): 20 mg via INTRAVENOUS
  Filled 2017-05-08: qty 50

## 2017-05-08 MED ORDER — ARTIFICIAL TEARS OPHTHALMIC OINT
TOPICAL_OINTMENT | OPHTHALMIC | Status: AC
  Filled 2017-05-08: qty 10.5

## 2017-05-08 MED ORDER — DOPAMINE-DEXTROSE 1.6-5 MG/ML-% IV SOLN
INTRAVENOUS | Status: DC | PRN
  Administered 2017-05-08: 3 ug/kg/min via INTRAVENOUS

## 2017-05-08 MED ORDER — LACTATED RINGERS IV SOLN
INTRAVENOUS | Status: DC
  Administered 2017-05-08: 17:00:00 via INTRAVENOUS

## 2017-05-08 MED ORDER — IOPAMIDOL (ISOVUE-370) INJECTION 76%
INTRAVENOUS | Status: AC
  Administered 2017-05-08: 100 mL via INTRAVENOUS
  Filled 2017-05-08: qty 100

## 2017-05-08 MED ORDER — VANCOMYCIN HCL IN DEXTROSE 1-5 GM/200ML-% IV SOLN
1000.0000 mg | Freq: Once | INTRAVENOUS | Status: AC
  Administered 2017-05-08: 1000 mg via INTRAVENOUS
  Filled 2017-05-08: qty 200

## 2017-05-08 MED ORDER — CHLORHEXIDINE GLUCONATE CLOTH 2 % EX PADS
6.0000 | MEDICATED_PAD | Freq: Once | CUTANEOUS | Status: AC
  Administered 2017-05-09: 6 via TOPICAL

## 2017-05-08 MED ORDER — CALCIUM CHLORIDE 10 % IV SOLN
1.0000 g | Freq: Once | INTRAVENOUS | Status: AC
  Administered 2017-05-08: 1 g via INTRAVENOUS

## 2017-05-08 MED ORDER — HYDROCORTISONE NA SUCCINATE PF 1000 MG IJ SOLR
INTRAMUSCULAR | Status: DC | PRN
  Administered 2017-05-08: 125 mg via INTRAVENOUS

## 2017-05-08 MED ORDER — HEPARIN SODIUM (PORCINE) 1000 UNIT/ML IJ SOLN
INTRAMUSCULAR | Status: AC
  Filled 2017-05-08: qty 1

## 2017-05-08 MED ORDER — CHLORHEXIDINE GLUCONATE 0.12% ORAL RINSE (MEDLINE KIT)
15.0000 mL | Freq: Two times a day (BID) | OROMUCOSAL | Status: DC
  Administered 2017-05-08 – 2017-05-11 (×6): 15 mL via OROMUCOSAL

## 2017-05-08 MED ORDER — SODIUM CHLORIDE 0.9 % IV SOLN
INTRAVENOUS | Status: DC
  Administered 2017-05-08: 10 mL/h via INTRAVENOUS
  Administered 2017-05-08: 17:00:00 via INTRAVENOUS

## 2017-05-08 MED ORDER — TRANEXAMIC ACID 1000 MG/10ML IV SOLN
1.5000 mg/kg/h | INTRAVENOUS | Status: AC
  Administered 2017-05-08: 1.5 mg/kg/h via INTRAVENOUS
  Filled 2017-05-08 (×2): qty 25

## 2017-05-08 MED ORDER — CHLORHEXIDINE GLUCONATE 0.12 % MT SOLN
15.0000 mL | OROMUCOSAL | Status: AC
  Administered 2017-05-08: 15 mL via OROMUCOSAL

## 2017-05-08 MED ORDER — HEMOSTATIC AGENTS (NO CHARGE) OPTIME
TOPICAL | Status: DC | PRN
Start: 2017-05-08 — End: 2017-05-08
  Administered 2017-05-08 (×6): 1 via TOPICAL

## 2017-05-08 MED ORDER — LACTATED RINGERS IV SOLN
INTRAVENOUS | Status: DC
  Administered 2017-05-08: 20 mL/h via INTRAVENOUS

## 2017-05-08 MED ORDER — PANTOPRAZOLE SODIUM 40 MG PO TBEC: 40.0000 mg | DELAYED_RELEASE_TABLET | Freq: Every day | ORAL | Status: DC

## 2017-05-08 MED ORDER — DEXMEDETOMIDINE HCL IN NACL 400 MCG/100ML IV SOLN
0.1000 ug/kg/h | INTRAVENOUS | Status: AC
  Administered 2017-05-08: .3 ug/kg/h via INTRAVENOUS
  Filled 2017-05-08 (×2): qty 100

## 2017-05-08 MED ORDER — MIDAZOLAM HCL 5 MG/5ML IJ SOLN
INTRAMUSCULAR | Status: DC | PRN
  Administered 2017-05-08: 5 mg via INTRAVENOUS
  Administered 2017-05-08: 3 mg via INTRAVENOUS
  Administered 2017-05-08 (×2): 2 mg via INTRAVENOUS

## 2017-05-08 MED ORDER — FENTANYL CITRATE (PF) 250 MCG/5ML IJ SOLN
INTRAMUSCULAR | Status: AC
  Filled 2017-05-08: qty 25

## 2017-05-08 MED ORDER — NITROGLYCERIN IN D5W 200-5 MCG/ML-% IV SOLN
2.0000 ug/min | INTRAVENOUS | Status: DC
  Filled 2017-05-08: qty 250

## 2017-05-08 MED ORDER — LACTATED RINGERS IV SOLN
INTRAVENOUS | Status: DC | PRN
  Administered 2017-05-08 (×2): via INTRAVENOUS

## 2017-05-08 MED ORDER — THROMBIN 20000 UNITS EX SOLR
CUTANEOUS | Status: DC | PRN
  Administered 2017-05-08: 20000 [IU] via TOPICAL

## 2017-05-08 MED ORDER — ORAL CARE MOUTH RINSE
15.0000 mL | Freq: Four times a day (QID) | OROMUCOSAL | Status: DC
  Administered 2017-05-09 – 2017-05-11 (×12): 15 mL via OROMUCOSAL

## 2017-05-08 MED ORDER — DEXTROSE 5 % IV SOLN
1.5000 g | Freq: Two times a day (BID) | INTRAVENOUS | Status: DC
  Administered 2017-05-08 – 2017-05-09 (×2): 1.5 g via INTRAVENOUS
  Filled 2017-05-08 (×5): qty 1.5

## 2017-05-08 MED ORDER — EPINEPHRINE PF 1 MG/ML IJ SOLN
INTRAMUSCULAR | Status: DC | PRN
  Administered 2017-05-08: 1 ug/min via INTRAVENOUS

## 2017-05-08 MED ORDER — GI COCKTAIL ~~LOC~~
30.0000 mL | Freq: Once | ORAL | Status: AC
  Administered 2017-05-08: 30 mL via ORAL
  Filled 2017-05-08: qty 30

## 2017-05-08 MED ORDER — VANCOMYCIN HCL 10 G IV SOLR
1250.0000 mg | INTRAVENOUS | Status: AC
  Administered 2017-05-08: 1250 mg via INTRAVENOUS
  Filled 2017-05-08: qty 1250

## 2017-05-08 MED ORDER — ACETAMINOPHEN 160 MG/5ML PO SOLN
1000.0000 mg | Freq: Four times a day (QID) | ORAL | Status: AC
  Administered 2017-05-08 – 2017-05-11 (×9): 1000 mg
  Filled 2017-05-08 (×9): qty 40.6

## 2017-05-08 MED ORDER — MORPHINE SULFATE (PF) 2 MG/ML IV SOLN: 1.0000 mg | INTRAVENOUS | Status: DC | PRN

## 2017-05-08 MED ORDER — ACETAMINOPHEN 650 MG RE SUPP
650.0000 mg | Freq: Once | RECTAL | Status: AC
  Administered 2017-05-08: 650 mg via RECTAL

## 2017-05-08 MED ORDER — SODIUM CHLORIDE 0.9 % IV SOLN
0.0000 ug/min | INTRAVENOUS | Status: DC
  Filled 2017-05-08: qty 2

## 2017-05-08 MED ORDER — ROCURONIUM BROMIDE 100 MG/10ML IV SOLN
INTRAVENOUS | Status: DC | PRN
  Administered 2017-05-08 (×2): 50 mg via INTRAVENOUS
  Administered 2017-05-08: 100 mg via INTRAVENOUS
  Administered 2017-05-08: 50 mg via INTRAVENOUS

## 2017-05-08 MED ORDER — PLASMA-LYTE 148 IV SOLN
INTRAVENOUS | Status: DC
  Filled 2017-05-08: qty 2.5

## 2017-05-08 MED ORDER — METOPROLOL TARTRATE 5 MG/5ML IV SOLN
2.5000 mg | INTRAVENOUS | Status: DC | PRN
  Administered 2017-05-15 – 2017-05-17 (×3): 5 mg via INTRAVENOUS
  Filled 2017-05-08 (×3): qty 5

## 2017-05-08 MED ORDER — SODIUM CHLORIDE 0.9 % IV SOLN: 0.0000 ug/kg/h | INTRAVENOUS | Status: DC

## 2017-05-08 MED ORDER — LACTATED RINGERS IV SOLN
INTRAVENOUS | Status: DC | PRN
  Administered 2017-05-08: 04:00:00 via INTRAVENOUS

## 2017-05-08 MED ORDER — LIDOCAINE 2% (20 MG/ML) 5 ML SYRINGE
INTRAMUSCULAR | Status: AC
  Filled 2017-05-08: qty 10

## 2017-05-08 MED ORDER — TRANEXAMIC ACID (OHS) BOLUS VIA INFUSION
15.0000 mg/kg | INTRAVENOUS | Status: AC
  Administered 2017-05-08: 1197 mg via INTRAVENOUS
  Filled 2017-05-08: qty 1197

## 2017-05-08 MED ORDER — NITROGLYCERIN IN D5W 200-5 MCG/ML-% IV SOLN
0.0000 ug/min | INTRAVENOUS | Status: DC
  Administered 2017-05-10: 15 ug/min via INTRAVENOUS
  Filled 2017-05-08: qty 250

## 2017-05-08 MED ORDER — ACETAMINOPHEN 160 MG/5ML PO SOLN: 650.0000 mg | Freq: Once | ORAL | Status: AC

## 2017-05-08 MED ORDER — POTASSIUM CHLORIDE 2 MEQ/ML IV SOLN
80.0000 meq | INTRAVENOUS | Status: DC
  Filled 2017-05-08: qty 40

## 2017-05-08 MED ORDER — TRANEXAMIC ACID (OHS) PUMP PRIME SOLUTION
2.0000 mg/kg | INTRAVENOUS | Status: DC
  Filled 2017-05-08: qty 1.6

## 2017-05-08 MED ORDER — BISACODYL 5 MG PO TBEC
10.0000 mg | DELAYED_RELEASE_TABLET | Freq: Every day | ORAL | Status: DC
  Administered 2017-05-11 – 2017-05-13 (×2): 10 mg via ORAL
  Filled 2017-05-08 (×2): qty 2

## 2017-05-08 MED ORDER — SODIUM BICARBONATE 8.4 % IV SOLN
INTRAVENOUS | Status: AC
  Filled 2017-05-08: qty 50

## 2017-05-08 MED ORDER — DEXMEDETOMIDINE HCL IN NACL 400 MCG/100ML IV SOLN
0.0000 ug/kg/h | INTRAVENOUS | Status: DC
  Administered 2017-05-09 (×3): 0.7 ug/kg/h via INTRAVENOUS
  Filled 2017-05-08 (×3): qty 100

## 2017-05-08 MED ORDER — SODIUM CHLORIDE 0.9 % IJ SOLN
INTRAMUSCULAR | Status: DC | PRN
  Administered 2017-05-08 (×3): via TOPICAL

## 2017-05-08 MED ORDER — FENTANYL CITRATE (PF) 250 MCG/5ML IJ SOLN
INTRAMUSCULAR | Status: AC
  Filled 2017-05-08: qty 5

## 2017-05-08 MED ORDER — MAGNESIUM SULFATE 50 % IJ SOLN
40.0000 meq | INTRAMUSCULAR | Status: DC
  Filled 2017-05-08: qty 10

## 2017-05-08 MED ORDER — SODIUM CHLORIDE 0.9% FLUSH: 3.0000 mL | INTRAVENOUS | Status: DC | PRN

## 2017-05-08 SURGICAL SUPPLY — 126 items
ADAPTER CARDIO PERF ANTE/RETRO (ADAPTER) ×8 IMPLANT
ADAPTER DLP PERFUSION .25INX2I (MISCELLANEOUS) ×2 IMPLANT
ADH SRG 12 PREFL SYR 3 SPRDR (MISCELLANEOUS) ×4
ADPR CRDPLG .25X.64 STRL (MISCELLANEOUS) ×4
ADPR PRFSN 84XANTGRD RTRGD (ADAPTER) ×8
BLADE STERNUM SYSTEM 6 (BLADE) ×6 IMPLANT
BLADE SURG 15 STRL LF DISP TIS (BLADE) ×4 IMPLANT
BLADE SURG 15 STRL SS (BLADE) ×12
BLANKET WARM CARDIAC ADLT BAIR (MISCELLANEOUS) ×2 IMPLANT
CANISTER SUCT 3000ML PPV (MISCELLANEOUS) ×6 IMPLANT
CANN PRFSN .5XCNCT 15X34-48 (MISCELLANEOUS) ×4
CANNULA GRAFT 8MMX50CM (Graft) ×2 IMPLANT
CANNULA GUNDRY RCSP 15FR (MISCELLANEOUS) ×6 IMPLANT
CANNULA PRFSN .5XCNCT 15X34-48 (MISCELLANEOUS) ×4 IMPLANT
CANNULA SUMP PERICARDIAL (CANNULA) ×2 IMPLANT
CANNULA VEN 2 STAGE (MISCELLANEOUS) ×6
CATH EMB 4FR 40CM (CATHETERS) ×2 IMPLANT
CATH EMB 6FR 80CM (CATHETERS) ×2 IMPLANT
CATH FOLEY 2WAY SLVR 30CC 24FR (CATHETERS) ×2 IMPLANT
CATH HEART VENT LEFT (CATHETERS) ×4 IMPLANT
CATH RETROPLEGIA CORONARY 14FR (CATHETERS) ×2 IMPLANT
CAUTERY SURG HI TEMP FINE TIP (MISCELLANEOUS) ×2 IMPLANT
CLIP FOGARTY SPRING 6M (CLIP) ×2 IMPLANT
CONT SPEC 4OZ CLIKSEAL STRL BL (MISCELLANEOUS) ×4 IMPLANT
CRADLE DONUT ADULT HEAD (MISCELLANEOUS) ×6 IMPLANT
DRAIN CHANNEL 32F RND 10.7 FF (WOUND CARE) ×6 IMPLANT
DRAPE CARDIOVASCULAR INCISE (DRAPES) ×6
DRAPE SLUSH/WARMER DISC (DRAPES) ×6 IMPLANT
DRAPE SRG 135X102X78XABS (DRAPES) ×4 IMPLANT
DRSG AQUACEL AG ADV 3.5X 6 (GAUZE/BANDAGES/DRESSINGS) ×2 IMPLANT
DRSG AQUACEL AG ADV 3.5X14 (GAUZE/BANDAGES/DRESSINGS) ×6 IMPLANT
ELECT BLADE 4.0 EZ CLEAN MEGAD (MISCELLANEOUS) ×6
ELECT REM PT RETURN 9FT ADLT (ELECTROSURGICAL) ×12
ELECTRODE BLDE 4.0 EZ CLN MEGD (MISCELLANEOUS) ×4 IMPLANT
ELECTRODE REM PT RTRN 9FT ADLT (ELECTROSURGICAL) ×8 IMPLANT
ENDOPROSTHESIS THORAC 31X31X10 (Endovascular Graft) IMPLANT
ENDOPROTH THORACIC 31X31X10 (Endovascular Graft) ×6 IMPLANT
FELT TEFLON 1X6 (MISCELLANEOUS) ×2 IMPLANT
GAUZE SPONGE 4X4 12PLY STRL (GAUZE/BANDAGES/DRESSINGS) ×12 IMPLANT
GAUZE SPONGE 4X4 12PLY STRL LF (GAUZE/BANDAGES/DRESSINGS) ×2 IMPLANT
GLOVE BIO SURGEON STRL SZ 6 (GLOVE) ×12 IMPLANT
GLOVE BIO SURGEON STRL SZ 6.5 (GLOVE) ×23 IMPLANT
GLOVE BIO SURGEON STRL SZ7.5 (GLOVE) ×2 IMPLANT
GLOVE BIO SURGEONS STRL SZ 6.5 (GLOVE) ×11
GLOVE BIOGEL PI IND STRL 6.5 (GLOVE) IMPLANT
GLOVE BIOGEL PI IND STRL 8 (GLOVE) IMPLANT
GLOVE BIOGEL PI INDICATOR 6.5 (GLOVE) ×6
GLOVE BIOGEL PI INDICATOR 8 (GLOVE) ×2
GOWN STRL REUS W/ TWL LRG LVL3 (GOWN DISPOSABLE) ×16 IMPLANT
GOWN STRL REUS W/TWL LRG LVL3 (GOWN DISPOSABLE) ×60
GRAFT 12X8X8MM AORT ARCH TRIF (Prosthesis & Implant Heart) ×2 IMPLANT
GRAFT WOVEN D/V 28DX30L (Vascular Products) ×2 IMPLANT
GRAFT WOVEN D/V 30DX30L (Vascular Products) IMPLANT
HANDLE STAPLE ENDO GIA SHORT (STAPLE) ×2
HEMOSTAT POWDER SURGIFOAM 1G (HEMOSTASIS) ×18 IMPLANT
HEMOSTAT SURGICEL 2X14 (HEMOSTASIS) ×10 IMPLANT
HEMOSTAT SURGICEL 2X4 FIBR (HEMOSTASIS) ×6 IMPLANT
KIT BASIN OR (CUSTOM PROCEDURE TRAY) ×6 IMPLANT
KIT ENCORE 26 ADVANTAGE (KITS) ×2 IMPLANT
KIT MARKER MARGIN INK (KITS) ×2 IMPLANT
KIT ROOM TURNOVER OR (KITS) ×6 IMPLANT
KIT SUCTION CATH 14FR (SUCTIONS) ×12 IMPLANT
LEAD PACING MYOCARDI (MISCELLANEOUS) ×6 IMPLANT
LINE VENT (MISCELLANEOUS) ×2 IMPLANT
LOOP VESSEL MAXI BLUE (MISCELLANEOUS) ×2 IMPLANT
LOOP VESSEL SUPERMAXI WHITE (MISCELLANEOUS) ×2 IMPLANT
NS IRRIG 1000ML POUR BTL (IV SOLUTION) ×32 IMPLANT
PACK OPEN HEART (CUSTOM PROCEDURE TRAY) ×6 IMPLANT
PAD ARMBOARD 7.5X6 YLW CONV (MISCELLANEOUS) ×12 IMPLANT
RELOAD TRI 2.0 30 VAS MED SUL (STAPLE) ×4 IMPLANT
SEALANT SURG COSEAL 4ML (VASCULAR PRODUCTS) ×2 IMPLANT
SEALANT SURG COSEAL 8ML (VASCULAR PRODUCTS) ×6 IMPLANT
SET CARDIOPLEGIA MPS 5001102 (MISCELLANEOUS) ×2 IMPLANT
SET VEIN GRAFT PERF (SET/KITS/TRAYS/PACK) ×2 IMPLANT
SPONGE LAP 18X18 X RAY DECT (DISPOSABLE) ×2 IMPLANT
SPONGE LAP 4X18 X RAY DECT (DISPOSABLE) ×2 IMPLANT
STAPLER ENDO GIA 12 SHRT THIN (STAPLE) IMPLANT
STAPLER ENDO GIA 12MM SHORT (STAPLE) ×4 IMPLANT
STENT VIABAHNBX 10X39X135 (Permanent Stent) ×2 IMPLANT
STOPCOCK 4 WAY LG BORE MALE ST (IV SETS) ×4 IMPLANT
SUT ETHIBON 2 0 V 52N 30 (SUTURE) IMPLANT
SUT ETHIBOND 2 0 SH (SUTURE) ×24
SUT ETHIBOND 2 0 SH 36X2 (SUTURE) ×4 IMPLANT
SUT PROLENE 3 0 RB 1 (SUTURE) ×8 IMPLANT
SUT PROLENE 3 0 SH 1 (SUTURE) ×6 IMPLANT
SUT PROLENE 3 0 SH DA (SUTURE) ×2 IMPLANT
SUT PROLENE 3 0 SH1 36 (SUTURE) ×18 IMPLANT
SUT PROLENE 4 0 RB 1 (SUTURE) ×204
SUT PROLENE 4-0 RB1 .5 CRCL 36 (SUTURE) ×8 IMPLANT
SUT PROLENE 5 0 C 1 36 (SUTURE) ×14 IMPLANT
SUT PROLENE 6 0 C 1 30 (SUTURE) ×18 IMPLANT
SUT PROLENE 7 0 BV 1 (SUTURE) ×8 IMPLANT
SUT PROLENE 8 0 BV175 6 (SUTURE) ×6 IMPLANT
SUT SILK  1 MH (SUTURE) ×6
SUT SILK 1 MH (SUTURE) IMPLANT
SUT SILK 1 TIES 10X30 (SUTURE) ×2 IMPLANT
SUT SILK 2 0 SH CR/8 (SUTURE) ×4 IMPLANT
SUT SILK 2 0 TIES 10X30 (SUTURE) ×2 IMPLANT
SUT SILK 2 0 TIES 17X18 (SUTURE) ×6
SUT SILK 2-0 18XBRD TIE BLK (SUTURE) IMPLANT
SUT SILK 3 0 SH CR/8 (SUTURE) ×2 IMPLANT
SUT SILK 4 0 TIE 10X30 (SUTURE) ×4 IMPLANT
SUT STEEL 6MS V (SUTURE) ×6 IMPLANT
SUT STEEL SZ 6 DBL 3X14 BALL (SUTURE) ×6 IMPLANT
SUT TEM PAC WIRE 2 0 SH (SUTURE) ×6 IMPLANT
SUT VIC AB 1 CTX 18 (SUTURE) ×12 IMPLANT
SUT VIC AB 1 CTX 27 (SUTURE) ×4 IMPLANT
SUT VIC AB 2-0 CTX 27 (SUTURE) ×2 IMPLANT
SUT VIC AB 3-0 X1 27 (SUTURE) ×6 IMPLANT
SYR 10ML KIT SKIN ADHESIVE (MISCELLANEOUS) ×2 IMPLANT
SYR 30ML LL (SYRINGE) ×2 IMPLANT
SYR 3ML LL SCALE MARK (SYRINGE) ×2 IMPLANT
SYSTEM SAHARA CHEST DRAIN ATS (WOUND CARE) ×6 IMPLANT
TAPE CLOTH SURG 4X10 WHT LF (GAUZE/BANDAGES/DRESSINGS) ×2 IMPLANT
TAPE PAPER 2X10 WHT MICROPORE (GAUZE/BANDAGES/DRESSINGS) ×2 IMPLANT
TRAY FOLEY SILVER 14FR TEMP (SET/KITS/TRAYS/PACK) ×6 IMPLANT
TUBE CONNECTING 12'X1/4 (SUCTIONS) ×1
TUBE CONNECTING 12X1/4 (SUCTIONS) ×1 IMPLANT
TUBE FEEDING 8FR 16IN STR KANG (MISCELLANEOUS) ×6 IMPLANT
TUBE SUCT INTRACARD DLP 20F (MISCELLANEOUS) ×2 IMPLANT
TUBING PVC 1/4X1/16 WALL 8 (MISCELLANEOUS) ×1 IMPLANT
TUBING PVC 1/4X1/16 WALL 8' (MISCELLANEOUS) ×1
UNDERPAD 30X30 (UNDERPADS AND DIAPERS) ×6 IMPLANT
VENT LEFT HEART 12002 (CATHETERS) ×6
WATER STERILE IRR 1000ML POUR (IV SOLUTION) ×12 IMPLANT
YANKAUER SUCT BULB TIP NO VENT (SUCTIONS) ×2 IMPLANT

## 2017-05-08 NOTE — Progress Notes (Signed)
  Echocardiogram Echocardiogram Transesophageal has been performed.  Don Medina 05/08/2017, 4:56 AM

## 2017-05-08 NOTE — ED Notes (Signed)
Patient transported to CT 

## 2017-05-08 NOTE — ED Notes (Signed)
ED provider bedside explaining situation, 2nd IV started, pt undressed.  Pt reports last meal of veggies was 2030.

## 2017-05-08 NOTE — Anesthesia Postprocedure Evaluation (Signed)
Anesthesia Post Note  Patient: Don Medina  Procedure(s) Performed: REPLACEMENT OF ASCENDING AORTIC ARCH (N/A Chest) INTRAOPERATIVE TRANSESOPHAGEAL ECHOCARDIOGRAM (N/A ) AXILLARY CANNULATION (Right ) HYPOTHERMIC CIRCULATORY ARREST (N/A ) REPLACEMENT ASCENDING AORTA (N/A Chest) STENT GRAFTING OF DESCENDING THORACIC AORTA STENT OF LEFT SUBCLAVIAN ARTERY with 10x39VBX DEBRANCHING OF LEFT CAROTID ARTERY AND INNOMINATE ARTERY     Patient location during evaluation: SICU Anesthesia Type: General Level of consciousness: sedated Pain management: pain level controlled Vital Signs Assessment: post-procedure vital signs reviewed and stable Respiratory status: patient remains intubated per anesthesia plan Cardiovascular status: stable Postop Assessment: no apparent nausea or vomiting Anesthetic complications: no    Last Vitals:  Vitals:   05/08/17 1845 05/08/17 1900  BP: 94/63 91/65  Pulse: 68 68  Resp: 14 12  Temp: (!) 35.4 C (!) 35.5 C  SpO2: 100% 100%    Last Pain:  Vitals:   05/08/17 0245  TempSrc:   PainSc: 2                  Braylynn Lewing S

## 2017-05-08 NOTE — Anesthesia Preprocedure Evaluation (Addendum)
Anesthesia Evaluation  Patient identified by MRN, date of birth, ID band Patient awake    Reviewed: Allergy & Precautions, NPO status , Patient's Chart, lab work & pertinent test results, Unable to perform ROS - Chart review onlyPreop documentation limited or incomplete due to emergent nature of procedure.  Airway Mallampati: II  TM Distance: >3 FB Neck ROM: Full    Dental  (+) Teeth Intact, Dental Advisory Given   Pulmonary former smoker,    Pulmonary exam normal breath sounds clear to auscultation       Cardiovascular Normal cardiovascular exam Rhythm:Regular Rate:Normal  CT Chest: Type A aortic dissection extending from the aortic root to the aortic bifurcation extending into bilateral common iliac artery origins. There are multiple fenestrations within the dissection flap with similar enhancement of both true and false lumens. No thrombus is identified. The aortic root origin of the dissection flap approaches the margins of the right and left coronary sinuses but does not extend into the coronary arteries. Dissection flap extends into right brachiocephalic artery, left common carotid artery, and left subclavian artery. The left common carotid artery dissection extends above the field of view into the neck.  Ascending aortic aneurysm measuring up to 5.3 cm.  Echo 12/30/15: Study Conclusions  - Left ventricle: The cavity size was normal. There was mild concentric hypertrophy. Systolic function was normal. The estimated ejection fraction was in the range of 55% to 60%. Wall motion was normal; there were no regional wall motion abnormalities. The study was not technically sufficient to allow evaluation of LV diastolic dysfunction due to frequent ventricular ectopy - Aortic valve: Mildly calcified annulus. Trileaflet; normal   thickness, mildly calcified leaflets. - Mitral valve: Calcified annulus. - Right ventricle: The cavity size was  moderately dilated. Wall thickness was normal. Systolic function was mildly to moderately reduced. - Impressions: Copmared to prior study, RV dimensions have increased and RV function appears mildly reduced.  Impressions:  - Copmared to prior study, RV dimensions have increased and RV function appears mildly reduced.   Neuro/Psych negative neurological ROS     GI/Hepatic Neg liver ROS, GERD  ,  Endo/Other  negative endocrine ROS  Renal/GU negative Renal ROS     Musculoskeletal  (+) Arthritis ,   Abdominal   Peds  Hematology  (+) Blood dyscrasia (Thrombocytopenia), ,   Anesthesia Other Findings Day of surgery medications reviewed with the patient.  Reproductive/Obstetrics                             Anesthesia Physical Anesthesia Plan  ASA: IV and emergent  Anesthesia Plan: General   Post-op Pain Management:    Induction: Intravenous  PONV Risk Score and Plan: 2 and Ondansetron and Dexamethasone  Airway Management Planned: Oral ETT  Additional Equipment: Arterial line, CVP, PA Cath, TEE and Ultrasound Guidance Line Placement  Intra-op Plan:   Post-operative Plan: Post-operative intubation/ventilation  Informed Consent: I have reviewed the patients History and Physical, chart, labs and discussed the procedure including the risks, benefits and alternatives for the proposed anesthesia with the patient or authorized representative who has indicated his/her understanding and acceptance.   Dental advisory given and Only emergency history available  Plan Discussed with: CRNA, Anesthesiologist and Surgeon  Anesthesia Plan Comments:        Anesthesia Quick Evaluation

## 2017-05-08 NOTE — ED Notes (Signed)
RN found pt out of bed standing, he asked if he could walk to the bathroom.  RN gave pt a urinal and requested he get back in bed.  Pt reported that his back hurt from the gurney.  Pt did eventually get back in bed, bed was adjusted for comfort and pillow was found.  Pt agreed to stay in bed.  Provider offered pain medication but pt refused.

## 2017-05-08 NOTE — Brief Op Note (Addendum)
05/08/2017  5:58 PM  PATIENT:  Don Medina  76 y.o. male  PRE-OPERATIVE DIAGNOSIS:  ASCENDING AORTIC DISSECTION- type 1   POST-OPERATIVE DIAGNOSIS:  ASCENDING AORTIC DISSECTION  PROCEDURE:  Procedure(s):  AXILLARY CANNULATION- right   HYPOTHERMIC CIRCULATORY ARREST  REPLACEMENT OF ASCENDING AORTIC ARCH 12x8x8 Gelweave Graft -Frozen elephant trunk  REPLACEMENT OF ASCENDING AORTA 28x30 Hemashield Platinum Graft  STENT GRAFTING OF DESCENDING THORACIC AORTA 31x31x10cm Gore Tag Thoracic Endoprosthesis  STENT OF LEFT SUBCLAVIAN ARTERY 10x39VBX  DEBRANCHING OF LEFT CAROTID ARTERY AND INNOMINATE ARTERY  INTRAOPERATIVE TRANSESOPHAGEAL ECHOCARDIOGRAM (N/A)   Repair of Aortic valve with resuspension   SURGEON:  Surgeon(s) and Role:    * [name], MD - Primary    * Angelia Mould, MD - Assisting  PHYSICIAN ASSISTANT: Ellwood Handler PA-C  ASSISTANTS: Genia Del RNFA, Vernie Murders RNFA   ANESTHESIA:   general  EBL:  Total I/O In: 4536 [I.V.:2900; Blood:2485] Out: 4680 [Urine:1050; Other:100; Blood:4600]  BLOOD ADMINISTERED:CC CELLSAVER,  2 FFP, 2 CRYO, 2 PLTS, 2 mg Factor 7 x 2  DRAINS: Mediastinal Chest drains   LOCAL MEDICATIONS USED:  NONE  SPECIMEN:  Source of Specimen:  Ascending Aorta  DISPOSITION OF SPECIMEN:  PATHOLOGY  COUNTS:  YES  TOURNIQUET:  * No tourniquets in log *  DICTATION: .Dragon Dictation  PLAN OF CARE: Admit to inpatient   PATIENT DISPOSITION:  ICU - intubated and hemodynamically stable.   Delay start of Pharmacological VTE agent (>24hrs) due to surgical blood loss or risk of bleeding: yes

## 2017-05-08 NOTE — ED Notes (Signed)
ED Provider at bedside. 

## 2017-05-08 NOTE — Progress Notes (Signed)
Patient ID: Don Medina, male   DOB: [birth year], 76 y.o.   MRN: [id number] EVENING ROUNDS NOTE :     [address]       [name] [address]             [phone]                 Day of Surgery Procedure(s) (LRB): REPLACEMENT OF ASCENDING AORTIC ARCH (N/A) INTRAOPERATIVE TRANSESOPHAGEAL ECHOCARDIOGRAM (N/A) AXILLARY CANNULATION (Right) HYPOTHERMIC CIRCULATORY ARREST (N/A) REPLACEMENT ASCENDING AORTA (N/A) STENT GRAFTING OF DESCENDING THORACIC AORTA STENT OF LEFT SUBCLAVIAN ARTERY with 10x39VBX DEBRANCHING OF LEFT CAROTID ARTERY AND INNOMINATE ARTERY  Total Length of Stay:  LOS: 0 days  BP (!) 97/54   Pulse 78   Temp (!) 96.3 F (35.7 C)   Resp 12   Ht 5\' 8"  (1.727 m)   Wt 176 lb (79.8 kg)   SpO2 100%   BMI 26.76 kg/m   .Intake/Output      09/29 0701 - 09/30 0700 09/30 0701 - 10/01 0700   I.V. (mL/kg) 1500 (18.8) 2900 (36.3)   Blood  2485   IV Piggyback 2000    Total Intake(mL/kg) 3500 (43.9) 5385 (67.5)   Urine (mL/kg/hr) 475 1050 (1.2)   Other  100   Blood  4600   Total Output 475 5750   Net +3025 -365          . sodium chloride 20 mL/hr at 05/08/17 1715  . sodium chloride    . [START ON 05/09/2017] sodium chloride    . sodium chloride 20 mL/hr at 05/08/17 1715  . albumin human    . cefUROXime (ZINACEF)  IV    . dexmedetomidine (PRECEDEX) IV infusion 0.7 mcg/kg/hr (05/08/17 1715)  . DOPamine 3 mcg/kg/min (05/08/17 1730)  . epinephrine    . famotidine (PEPCID) IV Stopped (05/08/17 1745)  . insulin (NOVOLIN-R) infusion 2.1 Units/hr (05/08/17 1715)  . lactated ringers    . lactated ringers 20 mL/hr at 05/08/17 1715  . lactated ringers    . magnesium sulfate 4 g (05/08/17 1739)  . milrinone    . nitroGLYCERIN    . phenylephrine (NEO-SYNEPHRINE) Adult infusion 10 mcg/min (05/08/17 1715)  . potassium chloride    . vancomycin       Lab Results  Component Value Date   WBC 8.0 05/08/2017   HGB 10.9 (L) 05/08/2017   HCT 31.8 (L)  05/08/2017   PLT 138 (L) 05/08/2017   GLUCOSE 205 (H) 05/08/2017   CHOL 128 12/09/2015   TRIG 58 12/09/2015   HDL 60 12/09/2015   LDLCALC 56 12/09/2015   ALT 15 08/23/2013   AST 21 08/23/2013   NA 141 05/08/2017   K 5.5 (H) 05/08/2017   CL 102 05/08/2017   CREATININE 0.90 05/08/2017   BUN 24 (H) 05/08/2017   CO2 22 05/07/2017   TSH 2.00 08/23/2013   PSA 1.40 08/23/2013   INR 1.05 05/08/2017   HGBA1C 6.1 (H) 12/09/2015   Early post op Sedated on vent Low chest tube output    [name] MD  Beeper [phone] Office [phone] 05/08/2017 6:00 PM

## 2017-05-08 NOTE — ED Notes (Signed)
Nurse drawing labs. 

## 2017-05-08 NOTE — Anesthesia Procedure Notes (Signed)
Central Venous Catheter Insertion Performed by: Catalina Gravel, anesthesiologist Start/End9/30/2018 4:10 AM, 05/08/2017 4:20 AM Patient location: Pre-op. Preanesthetic checklist: patient identified, IV checked, site marked, risks and benefits discussed, surgical consent, monitors and equipment checked, pre-op evaluation, timeout performed and anesthesia consent Position: Trendelenburg Lidocaine 1% used for infiltration and patient sedated Hand hygiene performed , maximum sterile barriers used  and Seldinger technique used Catheter size: 9 Fr Total catheter length 10. Central line was placed.MAC introducer Swan type:thermodilution PA Cath depth:50 Procedure performed using ultrasound guided technique. Ultrasound Notes:anatomy identified, needle tip was noted to be adjacent to the nerve/plexus identified, no ultrasound evidence of intravascular and/or intraneural injection and image(s) printed for medical record Attempts: 1 Following insertion, line sutured and dressing applied. Post procedure assessment: blood return through all ports, free fluid flow and no air  Patient tolerated the procedure well with no immediate complications.

## 2017-05-08 NOTE — Anesthesia Procedure Notes (Deleted)
Procedure Name: Intubation Date/Time: 05/08/2017 4:10 AM Performed by: Maude Leriche D Pre-anesthesia Checklist: Patient identified, Emergency Drugs available, Suction available, Patient being monitored and Timeout performed Patient Re-evaluated:Patient Re-evaluated prior to induction Oxygen Delivery Method: Circle system utilized Preoxygenation: Pre-oxygenation with 100% oxygen Induction Type: IV induction Ventilation: Mask ventilation without difficulty Laryngoscope Size: Miller and 2 Grade View: Grade I Tube type: Subglottic suction tube Tube size: 8.0 mm Number of attempts: 1 Airway Equipment and Method: Stylet Placement Confirmation: ETT inserted through vocal cords under direct vision,  positive ETCO2 and breath sounds checked- equal and bilateral Secured at: 23 cm Tube secured with: Tape Dental Injury: Teeth and Oropharynx as per pre-operative assessment

## 2017-05-08 NOTE — Transfer of Care (Signed)
Immediate Anesthesia Transfer of Care Note  Patient: Don Medina  Procedure(s) Performed: REPLACEMENT OF ASCENDING AORTIC ARCH (N/A Chest) INTRAOPERATIVE TRANSESOPHAGEAL ECHOCARDIOGRAM (N/A ) AXILLARY CANNULATION (Right ) HYPOTHERMIC CIRCULATORY ARREST (N/A ) REPLACEMENT ASCENDING AORTA (N/A Chest) STENT GRAFTING OF DESCENDING THORACIC AORTA STENT OF LEFT SUBCLAVIAN ARTERY with [id number] DEBRANCHING OF LEFT CAROTID ARTERY AND INNOMINATE ARTERY  Patient Location: SICU  Anesthesia Type:General  Level of Consciousness: sedated and unresponsive  Airway & Oxygen Therapy: Patient remains intubated per anesthesia plan and Patient placed on Ventilator (see vital sign flow sheet for setting)  Post-op Assessment: Report given to RN  Post vital signs: Reviewed and stable  Last Vitals:  Vitals:   05/08/17 0100 05/08/17 0316  BP: (!) 107/50 (!) 97/54  Pulse: 63 64  Resp: 14 15  Temp:    SpO2: 97% 97%    Last Pain:  Vitals:   05/08/17 0245  TempSrc:   PainSc: 2          Complications: No apparent anesthesia complications

## 2017-05-08 NOTE — H&P (Signed)
[address]       [place], [address]             [phone]        R[facility] W [facility] [place] Medical Record #[id number] Date of Birth: 10-25-1940  Referring: No ref. provider found Primary Care: Eulas Post, MD  Chief Complaint:    Chief Complaint  Patient presents with  . Chest Pain    History of Present Illness:     Patient was playing cards this evening with his family, while sitting at the table developed chest discomfort and diaphoresis. He noted the pain radiated into his back and to his jaw He thought perhaps he was having an MI Jude to aspirin and came to the emergency room. Initial CT scan to rule out PE suggested but was not confirmatory of an aortic dissection repeat CTA of the chest was completed approximately 5-6 hours after confirming ascending aortic dissection. Initial d-dimer was markedly elevated   Current Activity/ Functional Status: Patient is independent with mobility/ambulation, transfers, ADL's, IADL's.   Zubrod Score: At the time of surgery this patient's most appropriate activity status/level should be described as: [x]     0    Normal activity, no symptoms []     1    Restricted in physical strenuous activity but ambulatory, able to do out light work []     2    Ambulatory and capable of self care, unable to do work activities, up and about                 more than 50%  Of the time                            []     3    Only limited self care, in bed greater than 50% of waking hours []     4    Completely disabled, no self care, confined to bed or chair []     5    Moribund  Past Medical History:  Diagnosis Date  . Arthritis   . Blood in the stool   . Chicken pox   . Hay fever   . History of colonic polyps   . History of colonoscopy   . Hyperlipidemia     Past Surgical History:  Procedure Laterality Date  . ABDOMINAL ADHESION SURGERY  2007  . herniated diks surgery L-5  1996  . herniated disk surgery c 6-7   1986    History  Smoking Status  . Former Smoker  . Quit date: 08/09/1974  Smokeless Tobacco  . Never Used   History  Alcohol Use No    Social History   Social History  . Marital status: Married    Spouse name: N/A  . Number of children: N/A  . Years of education: N/A   Occupational History  . Retired    Social History Main Topics  . Smoking status: Former Smoker    Quit date: 08/09/1974  . Smokeless tobacco: Never Used  . Alcohol use No  . Drug use: No  . Sexual activity: Not on file      No Known Allergies  Current Facility-Administered Medications  Medication Dose Route Frequency Provider Last Rate Last Dose  . [START ON 05/09/2017] cefUROXime (ZINACEF) 1.5 g in dextrose 5 % 50 mL IVPB  1.5 g Intravenous To OR [name], MD      .  cefUROXime (ZINACEF) 750 mg in dextrose 5 % 50 mL IVPB  750 mg Intravenous To OR [name], MD      . Derrill Memo ON 05/09/2017] dexmedetomidine (PRECEDEX) 400 MCG/100ML (4 mcg/mL) infusion  0.1-0.7 mcg/kg/hr Intravenous To OR [name], MD      . Derrill Memo ON 05/09/2017] DOPamine (INTROPIN) 800 mg in dextrose 5 % 250 mL (3.2 mg/mL) infusion  0-10 mcg/kg/min Intravenous To OR [name], MD      . Derrill Memo ON 05/09/2017] EPINEPHrine (ADRENALIN) 4 mg in dextrose 5 % 250 mL (0.016 mg/mL) infusion  0-10 mcg/min Intravenous To OR [name], MD      . Derrill Memo ON 05/09/2017] heparin 2,500 Units, papaverine 30 mg in electrolyte-148 (PLASMALYTE-148) 500 mL irrigation   Irrigation To OR [name], MD      . Derrill Memo ON 05/09/2017] heparin 30,000 units/NS 1000 mL solution for CELLSAVER   Other To OR [name], MD      . Derrill Memo ON 05/09/2017] insulin regular (NOVOLIN R,HUMULIN R) 100 Units in sodium chloride 0.9 % 100 mL (1 Units/mL) infusion   Intravenous To OR [name], MD      . Derrill Memo ON 05/09/2017] magnesium sulfate (IV Push/IM) injection 40 mEq  40 mEq Other To OR [name], MD      .  nitroGLYCERIN (NITROSTAT) SL tablet 0.4 mg  0.4 mg Sublingual Q5 min PRN Cardama, Grayce Sessions, MD      . Derrill Memo ON 05/09/2017] nitroGLYCERIN 50 mg in dextrose 5 % 250 mL (0.2 mg/mL) infusion  2-200 mcg/min Intravenous To OR [name], MD      . Derrill Memo ON 05/09/2017] phenylephrine (NEO-SYNEPHRINE) 20 mg in sodium chloride 0.9 % 250 mL (0.08 mg/mL) infusion  30-200 mcg/min Intravenous To OR [name], MD      . Derrill Memo ON 05/09/2017] potassium chloride injection 80 mEq  80 mEq Other To OR [name], MD      . Derrill Memo ON 05/09/2017] tranexamic acid (CYKLOKAPRON) 2,500 mg in sodium chloride 0.9 % 250 mL (10 mg/mL) infusion  1.5 mg/kg/hr Intravenous To OR [name], MD      . Derrill Memo ON 05/09/2017] tranexamic acid (CYKLOKAPRON) bolus via infusion - over 30 minutes 1,197 mg  15 mg/kg Intravenous To OR [name], MD      . Derrill Memo ON 05/09/2017] tranexamic acid (CYKLOKAPRON) pump prime solution 160 mg  2 mg/kg Intracatheter To OR [name], MD      . Derrill Memo ON 05/09/2017] vancomycin (VANCOCIN) 1,250 mg in sodium chloride 0.9 % 250 mL IVPB  1,250 mg Intravenous To OR [name], MD       Current Outpatient Prescriptions  Medication Sig Dispense Refill  . aspirin 81 MG tablet Take 81 mg by mouth daily.    [name] Kitchen aspirin EC 325 MG tablet Take 325 mg by mouth once as needed (for chest pain).    . citalopram (CELEXA) 20 MG tablet TAKE ONE TABLET BY MOUTH ONCE DAILY (Patient taking differently: Take 20 mg by mouth once a day) 90 tablet 0  . desonide (DESOWEN) 0.05 % cream Apply 1 application topically daily as needed (for facial flares).     . folic acid (FOLVITE) 1 MG tablet Take 1 mg by mouth daily.      . meloxicam (MOBIC) 7.5 MG tablet Take 7.5 mg by mouth daily as needed (for shoulder pain).     [name] Kitchen  Misc Natural Products (GLUCOS-CHONDROIT-MSM COMPLEX) TABS Take 1 tablet by mouth 3 (three) times daily.    . Omega-3 Fatty Acids (FISH OIL) 1200 MG CAPS Take 1,200  mg by mouth 2 (two) times daily.     . simvastatin (ZOCOR) 20 MG tablet TAKE ONE TABLET BY MOUTH AT BEDTIME (Patient taking differently: Take 20 mg by mouth at bedtime) 90 tablet 0     (Not in a hospital admission)  Family History  Problem Relation Age of Onset  . Alzheimer's disease Father 57  . Dementia Father   . Stroke Mother 27  . Cancer Brother        Leukemia  . Sudden death Other        family  . Hypertension Unknown   . Heart disease Neg Hx      Review of Systems:  Pertinent items are noted in HPI.     Cardiac Review of Systems: Y or N  Chest Pain [ y   ]  Resting SOB [ n  ] Exertional SOB  [n  ]  Orthopnea [n  ]   Pedal Edema [n   ]    Palpitations Florencio.Farrier  ] Syncope  [n  ]   Presyncope [ y  ]  General Review of Systems: [Y] = yes [  ]=no Constitional: recent weight change [  n]; anorexia [  ]; fatigue [  ]; nausea [  ]; night sweats [  ]; fever [  ]; or chills [  ]                                                               Dental: poor dentition[  ]; Last Dentist visit:   Eye : blurred vision [  ]; diplopia [   ]; vision changes [  ];  Amaurosis fugax[  ]; Resp: cough [  ];  wheezing[ n ];  hemoptysis[ n ]; shortness of breath[  ]; paroxysmal nocturnal dyspnea[  ]; dyspnea on exertion[  ]; or orthopnea[  ];  GI:  gallstones[  ], vomiting[  ];  dysphagia[  ]; melena[  ];  hematochezia [  ]; heartburn[  ];   Hx of  Colonoscopy[  ]; GU: kidney stones [  ]; hematuria[  ];   dysuria [  ];  nocturia[  ];  history of     obstruction [  ]; urinary frequency [  ]             Skin: rash, swelling[  ];, hair loss[  ];  peripheral edema[  ];  or itching[  ]; Musculosketetal: myalgias[  ];  joint swelling[  ];  joint erythema[  ];  joint pain[  ];  back pain[  ];  Heme/Lymph: bruising[  ];  bleeding[  ];  anemia[  ];  Neuro: TIA[  ];  headaches[  ];  stroke[  ];  vertigo[  ];  seizures[n  ];   paresthesias[  ];  difficulty walking[ n ];  Psych:depression[  ]; anxiety[   ];  Endocrine: diabetes[  ];  thyroid dysfunction[  ];  Immunizations: Flu [  ]; Pneumococcal[  ];  Other:  Physical Exam: BP (!) 110/51   Pulse 60   Temp 98.1 F (36.7 C) (Oral)  Resp 10   Ht 5\' 8"  (1.727 m)   Wt 176 lb (79.8 kg)   SpO2 97%   BMI 26.76 kg/m  Patient is awake and alert and able to relate his history in good detail.  General appearance: alert, cooperative and mild distress Head: Normocephalic, without obvious abnormality, atraumatic Neck: no adenopathy, no carotid bruit, no JVD, supple, symmetrical, trachea midline and thyroid not enlarged, symmetric, no tenderness/mass/nodules Lymph nodes: Cervical, supraclavicular, and axillary nodes normal. Resp: clear to auscultation bilaterally Back: symmetric, no curvature. ROM normal. No CVA tenderness. Cardio: regular rate and rhythm and diastolic murmur: mid diastolic 2/6, decrescendo at lower left sternal border GI: soft, non-tender; bowel sounds normal; no masses,  no organomegaly Extremities: extremities normal, atraumatic, no cyanosis or edema and Homans sign is negative, no sign of DVT Neurologic: Grossly normal Patient with full and equal brachial radial pulses bilaterally, DP and PT pulses bilaterally full and equal  Diagnostic Studies & Laboratory data:     Recent Radiology Findings:   Dg Chest 2 View  Result Date: 05/07/2017 CLINICAL DATA:  Central chest pain. EXAM: CHEST  2 VIEW COMPARISON:  Radiograph 12/04/2015 FINDINGS: Mild chronic hyperinflation. The cardiomediastinal contours are unchanged with stable tortuosity of the thoracic aorta. The lungs are clear. Pulmonary vasculature is normal. No consolidation, pleural effusion, or pneumothorax. No acute osseous abnormalities are seen. IMPRESSION: Mild chronic hyperinflation without acute abnormality. Electronically Signed   By: Jeb Levering M.D.   On: 05/07/2017 22:55   Ct Angio Chest Pe W And/or Wo Contrast  Result Date: 05/08/2017 CLINICAL DATA:  Chest  pain.  Shortness of breath.  Elevated D-dimer. EXAM: CT ANGIOGRAPHY CHEST WITH CONTRAST TECHNIQUE: Multidetector CT imaging of the chest was performed using the standard protocol during bolus administration of intravenous contrast. Multiplanar CT image reconstructions and MIPs were obtained to evaluate the vascular anatomy. CONTRAST:  80 cc Isovue 370 IV COMPARISON:  Radiographs earlier this day FINDINGS: Cardiovascular: There are no filling defects within the pulmonary arteries to suggest pulmonary embolus. Aneurysmal dilatation of the ascending aorta, maximal dimension 5 cm in its midportion. There is question of mild periaortic soft tissue stranding. Descending aorta is normal in caliber. No intravenous contrast within the aorta to assess for dissection. There is contrast refluxing into the hepatic veins and IVC suggesting elevated right heart pressures. There are coronary artery calcifications. No definite pericardial fluid. Mediastinum/Nodes: Diffuse ill-defined soft tissue at the left hilum common tracking along the pulmonary vasculature an bronchi. No right hilar or mediastinal adenopathy. The esophagus is decompressed. Lungs/Pleura: Significant left bronchial thickening in the perihilar regions with ill-defined perihilar soft tissue density. Heterogeneous lung parenchyma on the left lower and upper lobes. Milder bronchial thickening in the right lung. 4 mm pleural nodule in the right upper lobe image 65 series 6. Scattered linear opacities throughout both lungs consistent with atelectasis. No pleural fluid. No definite pulmonary edema. Upper Abdomen: Contrast refluxing into the hepatic veins and IVC. Tiny hiatal hernia. Musculoskeletal: Thoracic scoliosis. No blastic or destructive lytic lesions. Review of the MIP images confirms the above findings. IMPRESSION: 1. Thoracic aortic aneurysm measuring 5 cm, with questionable periaortic soft tissue stranding. The aorta is unopacified, recommend repeat exam to  evaluate for acute aortic pathology such as dissection. 2. No pulmonary embolus. 3. Contrast refluxing into the hepatic veins and IVC consistent with elevated right heart pressures. Coronary artery calcifications. 4. Ill-defined left perihilar soft tissue tracking along the pulmonary vessels with bronchial thickening. No well-defined hilar mass  or adenopathy, underlying etiology is uncertain. This may be inflammatory, however neoplastic process is also considered. Recommend follow-up PET-CT. 5. Noncalcified 4 mm pleural-based nodule in the right upper lobe, nonspecific. Findings discussed with Dr. Leonette Monarch. Aortic aneurysm NOS (ICD10-I71.9). Electronically Signed   By: Jeb Levering M.D.   On: 05/08/2017 00:40   Ct Angio Chest/abd/pel For Dissection W And/or Wo Contrast  Addendum Date: 05/08/2017   ADDENDUM REPORT: 05/08/2017 02:47 ADDENDUM: These results were called by telephone at the time of interpretation on 05/08/2017 at 2:45 am to Dr. Betsey Holiday, who verbally acknowledged these results. Electronically Signed   By: Kristine Garbe M.D.   On: 05/08/2017 02:47   Result Date: 05/08/2017 CLINICAL DATA:  76 y/o  M; chest pain. EXAM: CT ANGIOGRAPHY CHEST, ABDOMEN AND PELVIS TECHNIQUE: Multidetector CT imaging through the chest, abdomen and pelvis was performed using the standard protocol during bolus administration of intravenous contrast. Multiplanar reconstructed images and MIPs were obtained and reviewed to evaluate the vascular anatomy. CONTRAST:  90 cc Isovue 370 COMPARISON:  12/04/2015 chest radiograph. FINDINGS: CTA CHEST FINDINGS Cardiovascular: Normal heart size. No pericardial effusion. Normal size main pulmonary artery. 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 Type A aortic dissection extending from the aortic root to the aortic bifurcation extending into bilateral common iliac artery origins. There are multiple fenestrations within the  dissection flap with similar enhancement of both true and false lumens. No thrombus is identified. The aortic root origin of the dissection flap approaches the margins of the right and left coronary sinuses but does not extend into the coronary arteries. Dissection flap extends into right brachiocephalic artery, left common carotid artery, and left subclavian artery. The left common carotid artery dissection extends above the field of view into the neck. Ascending aortic aneurysm measuring up to 5.3 cm. Mediastinum/Nodes: No enlarged mediastinal, hilar, or axillary lymph nodes. Thyroid gland, trachea, and esophagus demonstrate no significant findings. Lungs/Pleura: Lungs are clear. No pleural effusion or pneumothorax. Stable 4 mm pleural nodule in the right upper lobe. Musculoskeletal: No chest wall abnormality. No acute or significant osseous findings. Review of the MIP images confirms the above findings. CTA ABDOMEN AND PELVIS FINDINGS VASCULAR There are multiple fenestrations within the dissection flap in the thoracic aorta, assessing true/false lumen is difficult due to similar enhancement between the lumens. The lumen originating both coronary arteries is slightly increased in attenuation as well as larger and will be assumed to be the true lumen. Aorta: Dissection flap from aortic root to aortic bifurcation. No abdominal aortic aneurysm. No clot identified. Celiac: Patent, originating from true lumen. SMA: Patent, originating from false lumen. Renals: Patent bilaterally, the left arising from the true lumen and the right arising from false lumen. IMA: Patent, originating from false lumen. Inflow: Patent without evidence of aneurysm, dissection, vasculitis or significant stenosis. Veins: No obvious venous abnormality within the limitations of this arterial phase study. Review of the MIP images confirms the above findings. NON-VASCULAR Hepatobiliary: No focal liver abnormality is seen. No gallstones, gallbladder  wall thickening, or biliary dilatation. Pancreas: Unremarkable. No pancreatic ductal dilatation or surrounding inflammatory changes. Spleen: Normal in size without focal abnormality. Adrenals/Urinary Tract: Adrenal glands are unremarkable. Kidneys are normal, without renal calculi, focal lesion, or hydronephrosis. Bladder is unremarkable. Stomach/Bowel: Stomach is within normal limits. Appendix appears normal. No evidence of bowel wall thickening, distention, or inflammatory changes. Lymphatic: No lymphadenopathy. Reproductive: Prostate is unremarkable. Other: No abdominal wall hernia or abnormality. No abdominopelvic ascites.  Musculoskeletal: Multilevel degenerative changes greatest at L5-S1 where there is moderate disc space narrowing. Mild stable L1 compression deformity. Review of the MIP images confirms the above findings. IMPRESSION: 1. Complex type A aortic dissection extending from aortic root to aortic bifurcation. 2. The dissection flap approaches the margin of coronary sinuses but does not extend into the coronary arteries. 3. 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 4. Dissection flap extends into proximal right brachiocephalic artery and left subclavian artery. 5. Dissection flap extends into left common carotid artery above the field of view into the neck. 6. Dissection flap extends into proximal common iliac arteries bilaterally. 7. Large abdominal branches arise from both false and true lumens as described above, no dissection is identified extending into the abdominal artery origins. 8. Ascending aortic aneurysm measuring up to 5.3 cm. Electronically Signed: By: Kristine Garbe M.D. On: 05/08/2017 02:23     I have independently reviewed the above radiologic studies.  Recent Lab Findings: Lab Results  Component Value Date   WBC 8.3 05/07/2017   HGB 14.0 05/07/2017   HCT 39.9 05/07/2017   PLT 141 (L) 05/07/2017   GLUCOSE 118 (H)  05/07/2017   CHOL 128 12/09/2015   TRIG 58 12/09/2015   HDL 60 12/09/2015   LDLCALC 56 12/09/2015   ALT 15 08/23/2013   AST 21 08/23/2013   NA 136 05/07/2017   K 4.3 05/07/2017   CL 105 05/07/2017   CREATININE 1.11 05/07/2017   BUN 24 (H) 05/07/2017   CO2 22 05/07/2017   TSH 2.00 08/23/2013   HGBA1C 6.1 (H) 12/09/2015      Assessment / Plan:     Acute type I aortic dissection with dilated ascending aorta , with murmur of aortic insufficiency, confirmed with CTA of the chest. I discussed the diagnosis with patient and his family including the risks and options of surgery versus medical treatment. It's been recommended to the patient that we proceed with repair of the ascending aorta and possible valve replacement as necessary. I discussed with the patient and his family the risk of surgical intervention with this highly lethal problem. Patient is agreeable to proceeding.  The goals risks and alternatives of the planned surgical procedure Procedure(s): REPAIR OF ASCENDING AORTIC DISSECTION (N/A)  have been discussed with the patient in detail. The risks of the procedure including death, infection, stroke, myocardial infarction, bleeding, blood transfusion heart block requiring pacemaker have all been discussed specifically.  I have quoted [name] a 20 % of perioperative mortality and a complication rate as high as 60%. The patient's questions have been answered.EZIO WIECK is willing  to proceed with the planned procedure.    [name] MD      [address] [place] [address] Office [phone]   Beeper [phone]  05/08/2017 3:14 AM

## 2017-05-08 NOTE — Anesthesia Procedure Notes (Signed)
Central Venous Catheter Insertion Performed by: Catalina Gravel, anesthesiologist Start/End9/30/2018 4:20 AM, 05/08/2017 4:25 AM Patient location: Pre-op. Preanesthetic checklist: patient identified, IV checked, site marked, risks and benefits discussed, surgical consent, monitors and equipment checked, pre-op evaluation, timeout performed and anesthesia consent Hand hygiene performed  and maximum sterile barriers used  Total catheter length 90. PA cath was placed.Swan type:thermodilution PA Cath depth:50 Procedure performed using ultrasound guided technique. Ultrasound Notes:anatomy identified, needle tip was noted to be adjacent to the nerve/plexus identified, no ultrasound evidence of intravascular and/or intraneural injection and image(s) printed for medical record Attempts: 1 Patient tolerated the procedure well with no immediate complications.

## 2017-05-08 NOTE — ED Provider Notes (Addendum)
Patient signed out to me to follow-up on CT angiography. Patient seen with sudden onset of pain in his chest, back, bilateral jaws. Patient given nitroglycerin with some improvement of his pain. There is a pleuritic component. Vital signs are stable, no hypotension.  Patient initially had CT angiography to rule out PE because he had a markedly elevated d-dimer. No PE was seen, but there was abnormality of the proximal aorta noted. Patient sent back for repeat CT angiography to rule out dissection and he does have a type A dissection.  Discussed with Dr. Servando Snare, will see patient in the ER.   Orpah Greek, MD 05/08/17 [id number]    Orpah Greek, MD 05/08/17 [phone]

## 2017-05-08 NOTE — Progress Notes (Signed)
Pt SBP 65.  CI-1.7.  Dr. Servando Snare on unit and notified.  Orders received to start Epi at 1 mcg, increase dopamine to 65mcg, and give an additional albumin.  Will continue to closely monitor.

## 2017-05-08 NOTE — Anesthesia Procedure Notes (Signed)
Arterial Line Insertion Start/End9/30/2018 4:00 AM, 05/08/2017 4:15 AM Performed by: Babs Bertin, CRNA  Patient location: OR. Preanesthetic checklist: patient identified, IV checked, site marked, surgical consent, monitors and equipment checked, pre-op evaluation and timeout performed Emergency situation Lidocaine 1% used for infiltration and patient sedated Left, radial was placed Catheter size: 20 G Hand hygiene performed  and maximum sterile barriers used   Attempts: 1 Procedure performed without using ultrasound guided technique. Ultrasound Notes:anatomy identified, needle tip was noted to be adjacent to the nerve/plexus identified and no ultrasound evidence of intravascular and/or intraneural injection Following insertion, dressing applied and Biopatch. Post procedure assessment: normal  Patient tolerated the procedure with difficulty.

## 2017-05-08 NOTE — Anesthesia Procedure Notes (Signed)
Arterial Line Insertion Start/End9/30/2018 4:10 AM, 05/08/2017 4:20 AM Performed by: Babs Bertin, CRNA  Patient location: OR. Preanesthetic checklist: patient identified, IV checked, site marked, surgical consent, monitors and equipment checked, pre-op evaluation and timeout performed Emergency situation Patient sedated Right, radial was placed Catheter size: 20 G Hand hygiene performed  and maximum sterile barriers used  Allen's test indicative of satisfactory collateral circulation Attempts: 1 Procedure performed using ultrasound guided technique. Ultrasound Notes:anatomy identified, needle tip was noted to be adjacent to the nerve/plexus identified and no ultrasound evidence of intravascular and/or intraneural injection Following insertion, dressing applied and Biopatch. Post procedure assessment: normal  Patient tolerated the procedure with difficulty.

## 2017-05-08 NOTE — Anesthesia Procedure Notes (Signed)
Procedure Name: Intubation Date/Time: 05/08/2017 4:08 AM Performed by: Maude Leriche D Pre-anesthesia Checklist: Patient identified, Emergency Drugs available, Suction available, Patient being monitored and Timeout performed Patient Re-evaluated:Patient Re-evaluated prior to induction Oxygen Delivery Method: Circle system utilized Preoxygenation: Pre-oxygenation with 100% oxygen Induction Type: IV induction Ventilation: Mask ventilation without difficulty Laryngoscope Size: Miller and 2 Grade View: Grade I Tube type: Subglottic suction tube Tube size: 8.0 mm Number of attempts: 1 Airway Equipment and Method: Stylet Placement Confirmation: ETT inserted through vocal cords under direct vision,  positive ETCO2 and breath sounds checked- equal and bilateral Secured at: 23 cm Tube secured with: Tape Dental Injury: Teeth and Oropharynx as per pre-operative assessment

## 2017-05-09 ENCOUNTER — Encounter (HOSPITAL_COMMUNITY): Payer: Self-pay | Admitting: Cardiothoracic Surgery

## 2017-05-09 ENCOUNTER — Inpatient Hospital Stay (HOSPITAL_COMMUNITY): Payer: Medicare HMO

## 2017-05-09 LAB — BPAM FFP
BLOOD PRODUCT EXPIRATION DATE: 201810032359
Blood Product Expiration Date: 201810032359
ISSUE DATE / TIME: 201809301244
ISSUE DATE / TIME: 201809301244
UNIT TYPE AND RH: 6200
UNIT TYPE AND RH: 6200

## 2017-05-09 LAB — CBC
HCT: 26.8 % — ABNORMAL LOW (ref 39.0–52.0)
HEMATOCRIT: 23.1 % — AB (ref 39.0–52.0)
HEMOGLOBIN: 8.2 g/dL — AB (ref 13.0–17.0)
Hemoglobin: 9.4 g/dL — ABNORMAL LOW (ref 13.0–17.0)
MCH: 30.7 pg (ref 26.0–34.0)
MCH: 30.4 pg (ref 26.0–34.0)
MCHC: 35.5 g/dL (ref 30.0–36.0)
MCHC: 35.1 g/dL (ref 30.0–36.0)
MCV: 86.5 fL (ref 78.0–100.0)
MCV: 86.7 fL (ref 78.0–100.0)
PLATELETS: 74 10*3/uL — AB (ref 150–400)
Platelets: 98 10*3/uL — ABNORMAL LOW (ref 150–400)
RBC: 2.67 MIL/uL — ABNORMAL LOW (ref 4.22–5.81)
RBC: 3.09 MIL/uL — AB (ref 4.22–5.81)
RDW: 14.8 % (ref 11.5–15.5)
RDW: 14.9 % (ref 11.5–15.5)
WBC: 8.1 10*3/uL (ref 4.0–10.5)
WBC: 12.6 10*3/uL — AB (ref 4.0–10.5)

## 2017-05-09 LAB — POCT I-STAT, CHEM 8
BUN: 23 mg/dL — ABNORMAL HIGH (ref 6–20)
BUN: 23 mg/dL — ABNORMAL HIGH (ref 6–20)
BUN: 25 mg/dL — ABNORMAL HIGH (ref 6–20)
Calcium, Ion: 1.19 mmol/L (ref 1.15–1.40)
Calcium, Ion: 0.88 mmol/L — CL (ref 1.15–1.40)
Calcium, Ion: 1.1 mmol/L — ABNORMAL LOW (ref 1.15–1.40)
Chloride: 106 mmol/L (ref 101–111)
Chloride: 103 mmol/L (ref 101–111)
Chloride: 105 mmol/L (ref 101–111)
Creatinine, Ser: 0.8 mg/dL (ref 0.61–1.24)
Creatinine, Ser: 1 mg/dL (ref 0.61–1.24)
Creatinine, Ser: 1.4 mg/dL — ABNORMAL HIGH (ref 0.61–1.24)
Glucose, Bld: 127 mg/dL — ABNORMAL HIGH (ref 65–99)
Glucose, Bld: 179 mg/dL — ABNORMAL HIGH (ref 65–99)
Glucose, Bld: 136 mg/dL — ABNORMAL HIGH (ref 65–99)
HCT: 35 % — ABNORMAL LOW (ref 39.0–52.0)
HCT: 19 % — ABNORMAL LOW (ref 39.0–52.0)
HCT: 24 % — ABNORMAL LOW (ref 39.0–52.0)
Hemoglobin: 11.9 g/dL — ABNORMAL LOW (ref 13.0–17.0)
Hemoglobin: 6.5 g/dL — CL (ref 13.0–17.0)
Hemoglobin: 8.2 g/dL — ABNORMAL LOW (ref 13.0–17.0)
Potassium: 4.3 mmol/L (ref 3.5–5.1)
Potassium: 5 mmol/L (ref 3.5–5.1)
Potassium: 3.6 mmol/L (ref 3.5–5.1)
Sodium: 139 mmol/L (ref 135–145)
Sodium: 143 mmol/L (ref 135–145)
Sodium: 145 mmol/L (ref 135–145)
TCO2: 24 mmol/L (ref 22–32)
TCO2: 33 mmol/L — ABNORMAL HIGH (ref 22–32)
TCO2: 25 mmol/L (ref 22–32)

## 2017-05-09 LAB — BPAM CRYOPRECIPITATE
BLOOD PRODUCT EXPIRATION DATE: 201809301835
Blood Product Expiration Date: 201809301835
ISSUE DATE / TIME: 201809301302
ISSUE DATE / TIME: 201809301302
UNIT TYPE AND RH: 6200
Unit Type and Rh: 6200

## 2017-05-09 LAB — PREPARE CRYOPRECIPITATE
UNIT DIVISION: 0
UNIT DIVISION: 0

## 2017-05-09 LAB — PREPARE PLATELET PHERESIS
UNIT DIVISION: 0
Unit division: 0
Unit division: 0

## 2017-05-09 LAB — POCT I-STAT 3, ART BLOOD GAS (G3+)
Acid-Base Excess: 1 mmol/L (ref 0.0–2.0)
Bicarbonate: 24.2 mmol/L (ref 20.0–28.0)
O2 Saturation: 100 %
Patient temperature: 37.1
TCO2: 25 mmol/L (ref 22–32)
pCO2 arterial: 32.8 mmHg (ref 32.0–48.0)
pH, Arterial: 7.476 — ABNORMAL HIGH (ref 7.350–7.450)
pO2, Arterial: 185 mmHg — ABNORMAL HIGH (ref 83.0–108.0)

## 2017-05-09 LAB — BPAM PLATELET PHERESIS
BLOOD PRODUCT EXPIRATION DATE: 201810022359
Blood Product Expiration Date: 201810012359
Blood Product Expiration Date: 201810012359
ISSUE DATE / TIME: 201809301237
ISSUE DATE / TIME: 201809301237
ISSUE DATE / TIME: 201809301315
UNIT TYPE AND RH: 6200
Unit Type and Rh: 5100
Unit Type and Rh: 5100

## 2017-05-09 LAB — GLUCOSE, CAPILLARY
Glucose-Capillary: 104 mg/dL — ABNORMAL HIGH (ref 65–99)
Glucose-Capillary: 105 mg/dL — ABNORMAL HIGH (ref 65–99)
Glucose-Capillary: 107 mg/dL — ABNORMAL HIGH (ref 65–99)
Glucose-Capillary: 108 mg/dL — ABNORMAL HIGH (ref 65–99)
Glucose-Capillary: 110 mg/dL — ABNORMAL HIGH (ref 65–99)
Glucose-Capillary: 119 mg/dL — ABNORMAL HIGH (ref 65–99)
Glucose-Capillary: 119 mg/dL — ABNORMAL HIGH (ref 65–99)
Glucose-Capillary: 120 mg/dL — ABNORMAL HIGH (ref 65–99)
Glucose-Capillary: 121 mg/dL — ABNORMAL HIGH (ref 65–99)
Glucose-Capillary: 122 mg/dL — ABNORMAL HIGH (ref 65–99)
Glucose-Capillary: 130 mg/dL — ABNORMAL HIGH (ref 65–99)
Glucose-Capillary: 132 mg/dL — ABNORMAL HIGH (ref 65–99)
Glucose-Capillary: 134 mg/dL — ABNORMAL HIGH (ref 65–99)
Glucose-Capillary: 134 mg/dL — ABNORMAL HIGH (ref 65–99)
Glucose-Capillary: 136 mg/dL — ABNORMAL HIGH (ref 65–99)
Glucose-Capillary: 137 mg/dL — ABNORMAL HIGH (ref 65–99)
Glucose-Capillary: 150 mg/dL — ABNORMAL HIGH (ref 65–99)
Glucose-Capillary: 182 mg/dL — ABNORMAL HIGH (ref 65–99)
Glucose-Capillary: 185 mg/dL — ABNORMAL HIGH (ref 65–99)
Glucose-Capillary: 82 mg/dL (ref 65–99)
Glucose-Capillary: 99 mg/dL (ref 65–99)

## 2017-05-09 LAB — BASIC METABOLIC PANEL
Anion gap: 7 (ref 5–15)
BUN: 23 mg/dL — ABNORMAL HIGH (ref 6–20)
CALCIUM: 7.7 mg/dL — AB (ref 8.9–10.3)
CO2: 23 mmol/L (ref 22–32)
CREATININE: 1.54 mg/dL — AB (ref 0.61–1.24)
Chloride: 112 mmol/L — ABNORMAL HIGH (ref 101–111)
GFR calc non Af Amer: 42 mL/min — ABNORMAL LOW (ref >60)
GFR, EST AFRICAN AMERICAN: 49 mL/min — AB (ref >60)
Glucose, Bld: 119 mg/dL — ABNORMAL HIGH (ref 65–99)
Potassium: 4.3 mmol/L (ref 3.5–5.1)
SODIUM: 142 mmol/L (ref 135–145)

## 2017-05-09 LAB — CREATININE, SERUM
Creatinine, Ser: 1.48 mg/dL — ABNORMAL HIGH (ref 0.61–1.24)
GFR calc Af Amer: 52 mL/min — ABNORMAL LOW (ref >60)
GFR calc non Af Amer: 45 mL/min — ABNORMAL LOW (ref >60)

## 2017-05-09 LAB — PREPARE FRESH FROZEN PLASMA
UNIT DIVISION: 0
Unit division: 0

## 2017-05-09 LAB — MAGNESIUM
MAGNESIUM: 2.6 mg/dL — AB (ref 1.7–2.4)
MAGNESIUM: 2.2 mg/dL (ref 1.7–2.4)

## 2017-05-09 LAB — POCT I-STAT 4, (NA,K, GLUC, HGB,HCT)
Glucose, Bld: 131 mg/dL — ABNORMAL HIGH (ref 65–99)
HCT: 28 % — ABNORMAL LOW (ref 39.0–52.0)
Hemoglobin: 9.5 g/dL — ABNORMAL LOW (ref 13.0–17.0)
Potassium: 4.3 mmol/L (ref 3.5–5.1)
Sodium: 145 mmol/L (ref 135–145)

## 2017-05-09 LAB — ECHO INTRAOPERATIVE TEE
Height: 68 in
Weight: 2816 oz

## 2017-05-09 LAB — PREPARE RBC (CROSSMATCH)

## 2017-05-09 MED ORDER — PANTOPRAZOLE SODIUM 40 MG PO TBEC: 40.0000 mg | DELAYED_RELEASE_TABLET | Freq: Every day | ORAL | Status: DC

## 2017-05-09 MED ORDER — CEFUROXIME SODIUM 1.5 G IV SOLR
1.5000 g | Freq: Two times a day (BID) | INTRAVENOUS | Status: AC
  Administered 2017-05-10: 1.5 g via INTRAVENOUS
  Filled 2017-05-09 (×2): qty 1.5

## 2017-05-09 MED ORDER — ASPIRIN 81 MG PO CHEW: 81.0000 mg | CHEWABLE_TABLET | Freq: Every day | ORAL | Status: DC

## 2017-05-09 MED ORDER — DOCUSATE SODIUM 50 MG/5ML PO LIQD
200.0000 mg | Freq: Every day | ORAL | Status: DC
  Administered 2017-05-09 – 2017-05-13 (×5): 200 mg via ORAL
  Filled 2017-05-09 (×5): qty 20

## 2017-05-09 MED ORDER — POTASSIUM CHLORIDE 10 MEQ/50ML IV SOLN
10.0000 meq | INTRAVENOUS | Status: AC
  Administered 2017-05-09 (×2): 10 meq via INTRAVENOUS
  Filled 2017-05-09 (×2): qty 50

## 2017-05-09 MED ORDER — POTASSIUM CHLORIDE 10 MEQ/50ML IV SOLN
INTRAVENOUS | Status: AC
Start: 2017-05-09 — End: 2017-05-09
  Administered 2017-05-09: 10 meq
  Filled 2017-05-09: qty 50

## 2017-05-09 MED ORDER — POTASSIUM CHLORIDE 10 MEQ/50ML IV SOLN: 10.0000 meq | INTRAVENOUS | Status: DC | PRN

## 2017-05-09 MED ORDER — FUROSEMIDE 10 MG/ML IJ SOLN
40.0000 mg | Freq: Once | INTRAMUSCULAR | Status: AC
  Administered 2017-05-09: 40 mg via INTRAVENOUS
  Filled 2017-05-09: qty 4

## 2017-05-09 MED ORDER — ASPIRIN EC 81 MG PO TBEC: 81.0000 mg | DELAYED_RELEASE_TABLET | Freq: Every day | ORAL | Status: DC

## 2017-05-09 MED ORDER — DEXMEDETOMIDINE HCL IN NACL 400 MCG/100ML IV SOLN
0.0000 ug/kg/h | INTRAVENOUS | Status: DC
  Administered 2017-05-10: 0.7 ug/kg/h via INTRAVENOUS
  Administered 2017-05-10: 0.5 ug/kg/h via INTRAVENOUS
  Administered 2017-05-10: 1 ug/kg/h via INTRAVENOUS
  Administered 2017-05-11: 0.7 ug/kg/h via INTRAVENOUS
  Administered 2017-05-11: 0.9 ug/kg/h via INTRAVENOUS
  Filled 2017-05-09 (×7): qty 100

## 2017-05-09 MED ORDER — DOPAMINE-DEXTROSE 3.2-5 MG/ML-% IV SOLN
0.0000 ug/kg/min | INTRAVENOUS | Status: DC
  Administered 2017-05-10: 2 ug/kg/min via INTRAVENOUS
  Filled 2017-05-09: qty 250

## 2017-05-09 MED FILL — Sodium Chloride IV Soln 0.9%: INTRAVENOUS | Qty: 3000 | Status: AC

## 2017-05-09 MED FILL — Lidocaine HCl IV Inj 20 MG/ML: INTRAVENOUS | Qty: 5 | Status: AC

## 2017-05-09 MED FILL — Electrolyte-R (PH 7.4) Solution: INTRAVENOUS | Qty: 5000 | Status: AC

## 2017-05-09 MED FILL — Sodium Bicarbonate IV Soln 8.4%: INTRAVENOUS | Qty: 250 | Status: AC

## 2017-05-09 MED FILL — Mannitol IV Soln 20%: INTRAVENOUS | Qty: 500 | Status: AC

## 2017-05-09 NOTE — Progress Notes (Signed)
CT surgery p.m. Rounds  Slowly weaning the sedation otherwise patient becomes tachypneic. Excellent diuresis today Good peripheral pulses

## 2017-05-09 NOTE — Progress Notes (Signed)
Patient ID: Don Medina, male   DOB: [birth year], 76 y.o.   MRN: [id number] TCTS DAILY ICU PROGRESS NOTE                   [address]            [place] [address]          [phone]   1 Day Post-Op Procedure(s) (LRB): REPLACEMENT OF ASCENDING AORTIC ARCH (N/A) INTRAOPERATIVE TRANSESOPHAGEAL ECHOCARDIOGRAM (N/A) AXILLARY CANNULATION (Right) HYPOTHERMIC CIRCULATORY ARREST (N/A) REPLACEMENT ASCENDING AORTA (N/A) STENT GRAFTING OF DESCENDING THORACIC AORTA STENT OF LEFT SUBCLAVIAN ARTERY with 10x39VBX DEBRANCHING OF LEFT CAROTID ARTERY AND INNOMINATE ARTERY  Total Length of Stay:  LOS: 1 day   Subjective: Still sedated on vent, with decrease of sedation   , does follow simple commands and moves  all extremities   Objective: Vital signs in last 24 hours: Temp:  [95.7 F (35.4 C)-100.4 F (38 C)] 99.5 F (37.5 C) (10/01 0700) Pulse Rate:  [68-93] 90 (10/01 0720) Cardiac Rhythm: Normal sinus rhythm (10/01 0400) Resp:  [12-19] 12 (10/01 0720) BP: (65-127)/(49-80) 120/60 (10/01 0720) SpO2:  [94 %-100 %] 100 % (10/01 0720) Arterial Line BP: (55-145)/(39-85) 131/60 (10/01 0700) FiO2 (%):  [40 %-50 %] 40 % (10/01 0720) Weight:  [200 lb 2.8 oz (90.8 kg)] 200 lb 2.8 oz (90.8 kg) (10/01 0430)  Filed Weights   05/07/17 2142 05/09/17 0430  Weight: 176 lb (79.8 kg) 200 lb 2.8 oz (90.8 kg)    Weight change: 24 lb 2.8 oz (11 kg)   Hemodynamic parameters for last 24 hours: PAP: (11-28)/(6-15) 16/6 CO:  [2.2 L/min-4.5 L/min] 4.5 L/min CI:  [0 L/min/m2-2.3 L/min/m2] 2.3 L/min/m2  Intake/Output from previous day: 09/30 0701 - 10/01 0700 In: 7850.1 [I.V.:4285.1; KPTWS:5681; NG/GT:30; IV Piggyback:1050] Out: 7742 [Urine:2682; Blood:4600; Chest Tube:360]  Intake/Output this shift: No intake/output data recorded.  Current Meds: Scheduled Meds: . acetaminophen  1,000 mg Oral Q6H   Or  . acetaminophen (TYLENOL) oral liquid 160 mg/5 mL  1,000 mg Per Tube Q6H    . aspirin EC  325 mg Oral Daily   Or  . aspirin  324 mg Per Tube Daily  . bisacodyl  10 mg Oral Daily   Or  . bisacodyl  10 mg Rectal Daily  . chlorhexidine gluconate (MEDLINE KIT)  15 mL Mouth Rinse BID  . citalopram  20 mg Oral Daily  . docusate sodium  200 mg Oral Daily  . insulin regular  0-10 Units Intravenous TID WC  . mouth rinse  15 mL Mouth Rinse QID  . metoCLOPramide (REGLAN) injection  10 mg Intravenous Q6H  . metoprolol tartrate  12.5 mg Oral BID   Or  . metoprolol tartrate  12.5 mg Per Tube BID  . metoprolol tartrate  12.5 mg Oral Once  . [START ON 05/10/2017] pantoprazole  40 mg Oral Daily  . simvastatin  20 mg Oral QHS  . sodium chloride flush  3 mL Intravenous Q12H   Continuous Infusions: . sodium chloride 20 mL/hr at 05/09/17 0700  . sodium chloride    . sodium chloride    . sodium chloride 10 mL/hr at 05/09/17 0700  . cefUROXime (ZINACEF)  IV Stopped (05/08/17 1855)  . dexmedetomidine (PRECEDEX) IV infusion 0.7 mcg/kg/hr (05/09/17 0736)  . DOPamine 4.01 mcg/kg/min (05/09/17 0700)  . epinephrine Stopped (05/09/17 0334)  . insulin (NOVOLIN-R) infusion 0.5 Units/hr (05/09/17 0700)  . lactated ringers    .  lactated ringers 20 mL/hr at 05/09/17 0700  . lactated ringers 20 mL/hr (05/08/17 2047)  . milrinone    . nitroGLYCERIN Stopped (05/09/17 0347)  . phenylephrine (NEO-SYNEPHRINE) Adult infusion Stopped (05/08/17 2000)  . potassium chloride Stopped (05/09/17 0152)   PRN Meds:.sodium chloride, lactated ringers, metoprolol tartrate, midazolam, morphine injection, nitroGLYCERIN, ondansetron (ZOFRAN) IV, oxyCODONE, potassium chloride, sodium chloride flush, traMADol  General appearance: sedated  Neurologic: intact and opens eyes to name, moves all extremities to command  Heart: a paced  Lungs: diminished breath sounds bibasilar Abdomen: soft, non-tender; bowel sounds normal; no masses,  no organomegaly Extremities: extremities normal, atraumatic, no cyanosis or  edema and pedal pulses inatcat , arm bp were equal with bilateril a lines , one now out  Wound: intact  Lab Results: CBC: Recent Labs  05/08/17 2142 05/08/17 2251 05/09/17 0322  WBC 7.4  --  8.1  HGB 8.6* 8.2* 8.2*  HCT 24.4* 24.0* 23.1*  PLT 133*  --  98*   BMET:  Recent Labs  05/07/17 2145  05/08/17 2251 05/09/17 0322  NA 136  < > 143 142  K 4.3  < > 3.7 4.3  CL 105  < > 106 112*  CO2 22  --   --  23  GLUCOSE 118*  < > 156* 119*  BUN 24*  < > 20 23*  CREATININE 1.11  < > 1.20 1.54*  CALCIUM 9.2  --   --  7.7*  < > = values in this interval not displayed.  CMET: Lab Results  Component Value Date   WBC 8.1 05/09/2017   HGB 8.2 (L) 05/09/2017   HCT 23.1 (L) 05/09/2017   PLT 98 (L) 05/09/2017   GLUCOSE 119 (H) 05/09/2017   CHOL 128 12/09/2015   TRIG 58 12/09/2015   HDL 60 12/09/2015   LDLCALC 56 12/09/2015   ALT 15 08/23/2013   AST 21 08/23/2013   NA 142 05/09/2017   K 4.3 05/09/2017   CL 112 (H) 05/09/2017   CREATININE 1.54 (H) 05/09/2017   BUN 23 (H) 05/09/2017   CO2 23 05/09/2017   TSH 2.00 08/23/2013   PSA 1.40 08/23/2013   INR 1.05 05/08/2017   HGBA1C 6.1 (H) 12/09/2015      PT/INR:  Recent Labs  05/08/17 1700  LABPROT 13.6  INR 1.05   Radiology: Dg Chest Port 1 View  Result Date: 05/09/2017 CLINICAL DATA:  Endotracheal tube present EXAM: PORTABLE CHEST 1 VIEW COMPARISON:  Yesterday FINDINGS: Endotracheal tube tip is just below the clavicular heads. Shorter Swan-Ganz catheter, tip at the pulmonary artery outflow tract. An orogastric tube reaches the stomach. Thoracic drains are present. There are changes of aortic and great vessel stenting. The right lung shows mild hazy opacity. Extensive ill-defined opacity at the left base with probable small layering effusion. No visible pneumothorax. IMPRESSION: 1. Swan-Ganz catheter tip at the pulmonary artery outflow tract. Otherwise unchanged positioning of hardware. 2. Unchanged asymmetric left  atelectasis and pleural fluid. Electronically Signed   By: Monte Fantasia M.D.   On: 05/09/2017 07:39   Dg Chest Port 1 View  Result Date: 05/08/2017 CLINICAL DATA:  Status post aortic dissection repair. EXAM: PORTABLE CHEST 1 VIEW COMPARISON:  Radiographs of May 07, 2017. FINDINGS: Stent graft is seen involving the transverse aortic arch and descending thoracic aorta. Sternotomy wires are noted. Endotracheal tube is seen projected over tracheal air shadow with distal tip 4 cm above the carina. Nasogastric tube is seen entering the stomach. Right  internal jugular Swan-Ganz catheter is noted with distal tip directed toward right pulmonary artery. No pneumothorax is noted. Mild right upper lobe atelectasis or infiltrate is noted. Moderate left basilar atelectasis, is noted with associated pleural effusion. Bony thorax is unremarkable. IMPRESSION: Stent graft is noted involving transverse aortic arch and proximal descending thoracic aorta. Endotracheal and nasogastric tubes are in grossly good position. Mild right upper lobe atelectasis or infiltrate is noted. Moderate left basilar atelectasis is noted with associated pleural effusion. Electronically Signed   By: Marijo Conception, M.D.   On: 05/08/2017 18:19     Assessment/Plan: S/P Procedure(s) (LRB): REPLACEMENT OF ASCENDING AORTIC ARCH (N/A) INTRAOPERATIVE TRANSESOPHAGEAL ECHOCARDIOGRAM (N/A) AXILLARY CANNULATION (Right) HYPOTHERMIC CIRCULATORY ARREST (N/A) REPLACEMENT ASCENDING AORTA (N/A) STENT GRAFTING OF DESCENDING THORACIC AORTA STENT OF LEFT SUBCLAVIAN ARTERY with 10x39VBX DEBRANCHING OF LEFT CAROTID ARTERY AND INNOMINATE ARTERY Mobilize Diuresis Continue foley due to strict I&O, patient critically ill, patient in ICU and urinary output monitoring Expected Acute  Blood - loss Anemia Appears sedated but neuro intact- likely will continue vent today , Monitor uop- currently good output , cr up less then 0.3     [name] 05/09/2017 8:27 AM

## 2017-05-09 NOTE — Addendum Note (Signed)
Addendum  created 05/09/17 1416 by Catalina Gravel, MD   Anesthesia Event edited

## 2017-05-09 NOTE — Care Management Note (Signed)
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                 Action/Plan: NCM will follow for dc needs.   Expected Discharge Date:                  Expected Discharge Plan:     In-House Referral:     Discharge planning Services  CM Consult  Post Acute Care Choice:    Choice offered to:     DME Arranged:    DME Agency:     HH Arranged:    HH Agency:     Status of Service:  In process, will continue to follow  If discussed at Long Length of Stay Meetings, dates discussed:    Additional Comments:  Zenon Mayo, RN 05/09/2017, 4:32 PM

## 2017-05-10 ENCOUNTER — Inpatient Hospital Stay (HOSPITAL_COMMUNITY): Payer: Medicare HMO

## 2017-05-10 LAB — CBC
HCT: 27.1 % — ABNORMAL LOW (ref 39.0–52.0)
Hemoglobin: 9.5 g/dL — ABNORMAL LOW (ref 13.0–17.0)
MCH: 30.6 pg (ref 26.0–34.0)
MCHC: 35.1 g/dL (ref 30.0–36.0)
MCV: 87.4 fL (ref 78.0–100.0)
Platelets: 68 10*3/uL — ABNORMAL LOW (ref 150–400)
RBC: 3.1 MIL/uL — ABNORMAL LOW (ref 4.22–5.81)
RDW: 15.1 % (ref 11.5–15.5)
WBC: 12.6 10*3/uL — ABNORMAL HIGH (ref 4.0–10.5)

## 2017-05-10 LAB — GLUCOSE, CAPILLARY
Glucose-Capillary: 134 mg/dL — ABNORMAL HIGH (ref 65–99)
Glucose-Capillary: 177 mg/dL — ABNORMAL HIGH (ref 65–99)
Glucose-Capillary: 157 mg/dL — ABNORMAL HIGH (ref 65–99)
Glucose-Capillary: 105 mg/dL — ABNORMAL HIGH (ref 65–99)
Glucose-Capillary: 90 mg/dL (ref 65–99)
Glucose-Capillary: 110 mg/dL — ABNORMAL HIGH (ref 65–99)
Glucose-Capillary: 133 mg/dL — ABNORMAL HIGH (ref 65–99)
Glucose-Capillary: 108 mg/dL — ABNORMAL HIGH (ref 65–99)
Glucose-Capillary: 77 mg/dL (ref 65–99)
Glucose-Capillary: 100 mg/dL — ABNORMAL HIGH (ref 65–99)
Glucose-Capillary: 127 mg/dL — ABNORMAL HIGH (ref 65–99)
Glucose-Capillary: 127 mg/dL — ABNORMAL HIGH (ref 65–99)
Glucose-Capillary: 124 mg/dL — ABNORMAL HIGH (ref 65–99)
Glucose-Capillary: 129 mg/dL — ABNORMAL HIGH (ref 65–99)
Glucose-Capillary: 128 mg/dL — ABNORMAL HIGH (ref 65–99)
Glucose-Capillary: 132 mg/dL — ABNORMAL HIGH (ref 65–99)
Glucose-Capillary: 122 mg/dL — ABNORMAL HIGH (ref 65–99)
Glucose-Capillary: 55 mg/dL — ABNORMAL LOW (ref 65–99)
Glucose-Capillary: 169 mg/dL — ABNORMAL HIGH (ref 65–99)
Glucose-Capillary: 166 mg/dL — ABNORMAL HIGH (ref 65–99)
Glucose-Capillary: 127 mg/dL — ABNORMAL HIGH (ref 65–99)
Glucose-Capillary: 149 mg/dL — ABNORMAL HIGH (ref 65–99)
Glucose-Capillary: 122 mg/dL — ABNORMAL HIGH (ref 65–99)
Glucose-Capillary: 112 mg/dL — ABNORMAL HIGH (ref 65–99)
Glucose-Capillary: 115 mg/dL — ABNORMAL HIGH (ref 65–99)

## 2017-05-10 LAB — COMPREHENSIVE METABOLIC PANEL
ALT: 116 U/L — ABNORMAL HIGH (ref 17–63)
AST: 128 U/L — ABNORMAL HIGH (ref 15–41)
Albumin: 2.8 g/dL — ABNORMAL LOW (ref 3.5–5.0)
Alkaline Phosphatase: 31 U/L — ABNORMAL LOW (ref 38–126)
Anion gap: 6 (ref 5–15)
BUN: 27 mg/dL — ABNORMAL HIGH (ref 6–20)
CO2: 26 mmol/L (ref 22–32)
Calcium: 7.8 mg/dL — ABNORMAL LOW (ref 8.9–10.3)
Chloride: 110 mmol/L (ref 101–111)
Creatinine, Ser: 1.43 mg/dL — ABNORMAL HIGH (ref 0.61–1.24)
GFR calc Af Amer: 54 mL/min — ABNORMAL LOW (ref >60)
GFR calc non Af Amer: 46 mL/min — ABNORMAL LOW (ref >60)
Glucose, Bld: 84 mg/dL (ref 65–99)
Potassium: 3.5 mmol/L (ref 3.5–5.1)
Sodium: 142 mmol/L (ref 135–145)
Total Bilirubin: 1.3 mg/dL — ABNORMAL HIGH (ref 0.3–1.2)
Total Protein: 4.5 g/dL — ABNORMAL LOW (ref 6.5–8.1)

## 2017-05-10 LAB — POCT I-STAT 3, ART BLOOD GAS (G3+)
Acid-Base Excess: 6 mmol/L — ABNORMAL HIGH (ref 0.0–2.0)
Bicarbonate: 28.8 mmol/L — ABNORMAL HIGH (ref 20.0–28.0)
O2 Saturation: 100 %
Patient temperature: 36.3
TCO2: 30 mmol/L (ref 22–32)
pCO2 arterial: 32.2 mmHg (ref 32.0–48.0)
pH, Arterial: 7.557 — ABNORMAL HIGH (ref 7.350–7.450)
pO2, Arterial: 142 mmHg — ABNORMAL HIGH (ref 83.0–108.0)

## 2017-05-10 LAB — COOXEMETRY PANEL
Carboxyhemoglobin: 1.3 % (ref 0.5–1.5)
Methemoglobin: 0.8 % (ref 0.0–1.5)
O2 Saturation: 59.5 %
Total hemoglobin: 8.7 g/dL — ABNORMAL LOW (ref 12.0–16.0)

## 2017-05-10 LAB — MRSA PCR SCREENING: MRSA by PCR: NEGATIVE

## 2017-05-10 MED ORDER — POTASSIUM CHLORIDE 10 MEQ/50ML IV SOLN
10.0000 meq | INTRAVENOUS | Status: AC
  Administered 2017-05-10 (×3): 10 meq via INTRAVENOUS
  Filled 2017-05-10 (×3): qty 50

## 2017-05-10 MED ORDER — DEXTROSE 50 % IV SOLN
INTRAVENOUS | Status: AC
  Administered 2017-05-10: 18 mL
  Filled 2017-05-10: qty 50

## 2017-05-10 MED ORDER — MILRINONE LACTATE IN DEXTROSE 20-5 MG/100ML-% IV SOLN
0.3000 ug/kg/min | INTRAVENOUS | Status: DC
  Administered 2017-05-10 – 2017-05-12 (×5): 0.3 ug/kg/min via INTRAVENOUS
  Filled 2017-05-10 (×5): qty 100

## 2017-05-10 NOTE — Progress Notes (Signed)
Patient ID: Don Medina, male   DOB: 03/09/1941, 76 y.o.   MRN: [id number] TCTS DAILY ICU PROGRESS NOTE                   [address]            [place] [address]          [phone]   2 Days Post-Op Procedure(s) (LRB): REPLACEMENT OF ASCENDING AORTIC ARCH (N/A) INTRAOPERATIVE TRANSESOPHAGEAL ECHOCARDIOGRAM (N/A) AXILLARY CANNULATION (Right) HYPOTHERMIC CIRCULATORY ARREST (N/A) REPLACEMENT ASCENDING AORTA (N/A) STENT GRAFTING OF DESCENDING THORACIC AORTA STENT OF LEFT SUBCLAVIAN ARTERY with 10x39VBX DEBRANCHING OF LEFT CAROTID ARTERY AND INNOMINATE ARTERY  Total Length of Stay:  LOS: 2 days   Subjective: Sedated on vent , when light follows commands   Objective: Vital signs in last 24 hours: Temp:  [97 F (36.1 C)-99.9 F (37.7 C)] 97.2 F (36.2 C) (10/02 0630) Pulse Rate:  [88-108] 89 (10/02 0630) Cardiac Rhythm: Normal sinus rhythm (10/02 0400) Resp:  [12] 12 (10/01 1511) BP: (92-145)/(56-107) 128/77 (10/02 0630) SpO2:  [95 %-100 %] 100 % (10/02 0630) Arterial Line BP: (68-156)/(37-85) 143/76 (10/02 0630) FiO2 (%):  [40 %] 40 % (10/02 0427) Weight:  [191 lb 12.8 oz (87 kg)] 191 lb 12.8 oz (87 kg) (10/02 0427)  Filed Weights   05/07/17 2142 05/09/17 0430 05/10/17 0427  Weight: 176 lb (79.8 kg) 200 lb 2.8 oz (90.8 kg) 191 lb 12.8 oz (87 kg)    Weight change: -8 lb 6 oz (-3.8 kg)   Hemodynamic parameters for last 24 hours: PAP: (10-40)/(3-30) 20/10 CO:  [3.1 L/min-4.7 L/min] 3.4 L/min CI:  [1.6 L/min/m2-2.4 L/min/m2] 1.8 L/min/m2  Intake/Output from previous day: 10/01 0701 - 10/02 0700 In: 2474 [P.O.:90; I.V.:1941.5; Blood:292.5; IV Piggyback:150] Out: 3382 [Urine:2928; Emesis/NG output:750; Chest Tube:340]  Intake/Output this shift: No intake/output data recorded.  Current Meds: Scheduled Meds: . acetaminophen  1,000 mg Oral Q6H   Or  . acetaminophen (TYLENOL) oral liquid 160 mg/5 mL  1,000 mg Per Tube Q6H  . aspirin EC  81 mg  Oral Daily   Or  . aspirin  81 mg Per Tube Daily  . bisacodyl  10 mg Oral Daily   Or  . bisacodyl  10 mg Rectal Daily  . chlorhexidine gluconate (MEDLINE KIT)  15 mL Mouth Rinse BID  . citalopram  20 mg Oral Daily  . docusate  200 mg Oral Daily  . insulin regular  0-10 Units Intravenous TID WC  . mouth rinse  15 mL Mouth Rinse QID  . metoprolol tartrate  12.5 mg Oral BID   Or  . metoprolol tartrate  12.5 mg Per Tube BID  . metoprolol tartrate  12.5 mg Oral Once  . pantoprazole  40 mg Oral Daily  . simvastatin  20 mg Oral QHS  . sodium chloride flush  3 mL Intravenous Q12H   Continuous Infusions: . sodium chloride 20 mL/hr at 05/10/17 0700  . sodium chloride    . sodium chloride    . sodium chloride 10 mL/hr at 05/10/17 0700  . cefUROXime (ZINACEF)  IV Stopped (05/10/17 0300)  . dexmedetomidine (PRECEDEX) IV infusion 0.7 mcg/kg/hr (05/10/17 0700)  . DOPamine 1.997 mcg/kg/min (05/10/17 0700)  . epinephrine Stopped (05/09/17 1100)  . insulin (NOVOLIN-R) infusion 1.5 Units/hr (05/10/17 0700)  . lactated ringers Stopped (05/09/17 2231)  . lactated ringers Stopped (05/09/17 2231)  . lactated ringers Stopped (05/09/17 2230)  . nitroGLYCERIN Stopped (05/09/17 2000)  .  phenylephrine (NEO-SYNEPHRINE) Adult infusion Stopped (05/08/17 2000)  . potassium chloride Stopped (05/10/17 0744)   PRN Meds:.sodium chloride, lactated ringers, metoprolol tartrate, midazolam, morphine injection, ondansetron (ZOFRAN) IV, oxyCODONE, sodium chloride flush, traMADol  General appearance: sedated  Neurologic: appears inatct when wakes with decreased sedation  Heart: regular rate and rhythm, S1, S2 normal, no murmur, click, rub or gallop Lungs: diminished breath sounds bibasilar Abdomen: soft, non-tender; bowel sounds normal; no masses,  no organomegaly Extremities: palpabable distal pulses  Wound: intact  Lab Results: CBC: Recent Labs  05/09/17 1718 05/09/17 1731 05/10/17 0350  WBC 12.6*  --   12.6*  HGB 9.4* 8.2* 9.5*  HCT 26.8* 24.0* 27.1*  PLT 74*  --  68*   BMET:  Recent Labs  05/09/17 0322  05/09/17 1731 05/10/17 0350  NA 142  --  145 142  K 4.3  --  3.6 3.5  CL 112*  --  105 110  CO2 23  --   --  26  GLUCOSE 119*  --  136* 84  BUN 23*  --  25* 27*  CREATININE 1.54*  < > 1.40* 1.43*  CALCIUM 7.7*  --   --  7.8*  < > = values in this interval not displayed.  CMET: Lab Results  Component Value Date   WBC 12.6 (H) 05/10/2017   HGB 9.5 (L) 05/10/2017   HCT 27.1 (L) 05/10/2017   PLT 68 (L) 05/10/2017   GLUCOSE 84 05/10/2017   CHOL 128 12/09/2015   TRIG 58 12/09/2015   HDL 60 12/09/2015   LDLCALC 56 12/09/2015   ALT 116 (H) 05/10/2017   AST 128 (H) 05/10/2017   NA 142 05/10/2017   K 3.5 05/10/2017   CL 110 05/10/2017   CREATININE 1.43 (H) 05/10/2017   BUN 27 (H) 05/10/2017   CO2 26 05/10/2017   TSH 2.00 08/23/2013   PSA 1.40 08/23/2013   INR 1.05 05/08/2017   HGBA1C 6.1 (H) 12/09/2015      PT/INR:  Recent Labs  05/08/17 1700  LABPROT 13.6  INR 1.05   Radiology: Dg Chest Port 1 View  Result Date: 05/10/2017 CLINICAL DATA:  76 year old male post treatment aortic dissection. Subsequent encounter. EXAM: PORTABLE CHEST 1 VIEW COMPARISON:  05/09/2017. FINDINGS: Endotracheal tube tip 8.7 cm above the carina. Right-sided Swan-Ganz catheter tip pulmonary outflow tract. Nasogastric tube courses below the diaphragm. Tip is not included on the present exam. Post median sternotomy. Endovascular treatment aortic dissection. Stenting great vessel. Mediastinal drain in place. Size of aortic knob region similar to prior postoperative exam. Consolidation left base may represent postoperative atelectasis and pleural effusion. Mild consolidation right lung base may represent posteriorly layering pleural effusion with minimal basal atelectasis. Central pulmonary vascular prominence. No gross pneumothorax. IMPRESSION: Swan-Ganz catheter tip pulmonary outflow tract level  without change. Post endovascular stenting aortic dissection. Consolidation retrocardiac region similar to prior exams suggestive of atelectasis and left-sided pleural effusion. Small right-sided pleural effusion may be present. Electronically Signed   By: Genia Del M.D.   On: 05/10/2017 07:50     Assessment/Plan: S/P Procedure(s) (LRB): REPLACEMENT OF ASCENDING AORTIC ARCH (N/A) INTRAOPERATIVE TRANSESOPHAGEAL ECHOCARDIOGRAM (N/A) AXILLARY CANNULATION (Right) HYPOTHERMIC CIRCULATORY ARREST (N/A) REPLACEMENT ASCENDING AORTA (N/A) STENT GRAFTING OF DESCENDING THORACIC AORTA STENT OF LEFT SUBCLAVIAN ARTERY with 10x39VBX DEBRANCHING OF LEFT CAROTID ARTERY AND INNOMINATE ARTERY Chest xray ok, renal function stable, plan decrease sedation and wean vent   Low plts, avoid heparin now     [name] 05/10/2017  8:00 AM

## 2017-05-10 NOTE — Progress Notes (Signed)
05/10/2017 1730 ASA d/c per verbal order Dr. Servando Snare due to decreased PLT. Orders enacted. Will continue to closely monitor patient. Lucky Trotta, Arville Lime

## 2017-05-10 NOTE — Progress Notes (Signed)
05/10/2017 1000 Pt. With intermittent episode of extreme agitation this am. Precedex at 0.7 and prn versed already administered per orders x 2. Dr. Servando Snare paged and made aware. Verbal order received ok to increase Precedex to ensure patient comfort and safety. PCXR obtained in order to confirm ETT placement post agitation. RT at bedside to assess ETT as well. No change in measurements but pt. With cuff leak. RT applied air to cuff. Will continue to closely monitor patient.  Kaladin Noseworthy, Arville Lime

## 2017-05-10 NOTE — Progress Notes (Signed)
      [address]       [place] [address]             [phone]      POD # 2 repair aortic dissection  BP 101/68   Pulse 89   Temp 97.7 F (36.5 C)   Resp 12   Ht 5\' 8"  (1.727 m)   Wt 191 lb 12.8 oz (87 kg)   SpO2 98%   BMI 29.16 kg/m    Intake/Output Summary (Last 24 hours) at 05/10/17 1851 Last data filed at 05/10/17 1800  Gross per 24 hour  Intake          1880.76 ml  Output             3023 ml  Net         -1142.24 ml   Index up to 2.23 with milrinone  Unable to extubate due to agitation. Will continue to try as mental status allows  Remo Lipps C. Roxan Hockey, MD [facility] [phone]

## 2017-05-10 NOTE — Progress Notes (Addendum)
05/10/2017 1600 Dr. Servando Snare paged and made aware of decreasing CI over coarse of day. Currently CI 1.63, Dopamine at 61mcg/kg/min, NTG at 25 mcg. Verbal order Dr. Servando Snare to initiate milrinone at 0.3 mcg/kg/min. Orders enacted. Will continue to monitor patient. Co-ox sent at 1630 and orders placed for Co-ox in am per verbal order Dr. Servando Snare. Lazette Estala, Arville Lime

## 2017-05-10 NOTE — Progress Notes (Signed)
Attempted to wean precedex.  While precedex 0.5, pt sitting up and almost extubate self with restraints in place.  Precedex increased back to 0.7.  Will continue to monitor closely.

## 2017-05-10 NOTE — Progress Notes (Addendum)
Patient is waking up violently, becomes combative without weaning precedex and flails arms and legs over the side of the bed. During these episodes, pt BP drops, RN pauses nitro as he becomes increasingly combative and bucks against the vent, pt responds to commands but is not redirectable, continues to shake head when RN speaks to him. PRN versed has been given twice already this shift due to the above symptoms. Pt recovers and BP increases to MAP 110s, nitro restarted and requires up titration to recontrol BP. This assessment and these findings are consistent with day shift RN report. Unable to tolerate vent weaning at this time due to his agitation.

## 2017-05-11 ENCOUNTER — Inpatient Hospital Stay (HOSPITAL_COMMUNITY): Payer: Medicare HMO

## 2017-05-11 LAB — COOXEMETRY PANEL
Carboxyhemoglobin: 0.9 % (ref 0.5–1.5)
Methemoglobin: 1.3 % (ref 0.0–1.5)
O2 Saturation: 68.7 %
Total hemoglobin: 8.1 g/dL — ABNORMAL LOW (ref 12.0–16.0)

## 2017-05-11 LAB — GLUCOSE, CAPILLARY
Glucose-Capillary: 100 mg/dL — ABNORMAL HIGH (ref 65–99)
Glucose-Capillary: 102 mg/dL — ABNORMAL HIGH (ref 65–99)
Glucose-Capillary: 105 mg/dL — ABNORMAL HIGH (ref 65–99)
Glucose-Capillary: 107 mg/dL — ABNORMAL HIGH (ref 65–99)
Glucose-Capillary: 107 mg/dL — ABNORMAL HIGH (ref 65–99)
Glucose-Capillary: 109 mg/dL — ABNORMAL HIGH (ref 65–99)
Glucose-Capillary: 110 mg/dL — ABNORMAL HIGH (ref 65–99)
Glucose-Capillary: 111 mg/dL — ABNORMAL HIGH (ref 65–99)
Glucose-Capillary: 115 mg/dL — ABNORMAL HIGH (ref 65–99)
Glucose-Capillary: 120 mg/dL — ABNORMAL HIGH (ref 65–99)
Glucose-Capillary: 121 mg/dL — ABNORMAL HIGH (ref 65–99)
Glucose-Capillary: 123 mg/dL — ABNORMAL HIGH (ref 65–99)
Glucose-Capillary: 123 mg/dL — ABNORMAL HIGH (ref 65–99)
Glucose-Capillary: 135 mg/dL — ABNORMAL HIGH (ref 65–99)
Glucose-Capillary: 142 mg/dL — ABNORMAL HIGH (ref 65–99)
Glucose-Capillary: 89 mg/dL (ref 65–99)
Glucose-Capillary: 97 mg/dL (ref 65–99)

## 2017-05-11 LAB — BASIC METABOLIC PANEL
Anion gap: 7 (ref 5–15)
BUN: 28 mg/dL — ABNORMAL HIGH (ref 6–20)
CO2: 22 mmol/L (ref 22–32)
Calcium: 7.6 mg/dL — ABNORMAL LOW (ref 8.9–10.3)
Chloride: 110 mmol/L (ref 101–111)
Creatinine, Ser: 1.13 mg/dL (ref 0.61–1.24)
GFR calc Af Amer: >60 mL/min (ref >60)
GFR calc non Af Amer: >60 mL/min (ref >60)
Glucose, Bld: 109 mg/dL — ABNORMAL HIGH (ref 65–99)
Potassium: 3.1 mmol/L — ABNORMAL LOW (ref 3.5–5.1)
Sodium: 139 mmol/L (ref 135–145)

## 2017-05-11 LAB — POCT I-STAT 3, ART BLOOD GAS (G3+)
Acid-Base Excess: 1 mmol/L (ref 0.0–2.0)
Bicarbonate: 23.4 mmol/L (ref 20.0–28.0)
Bicarbonate: 23.3 mmol/L (ref 20.0–28.0)
O2 Saturation: 97 %
O2 Saturation: 97 %
TCO2: 24 mmol/L (ref 22–32)
TCO2: 24 mmol/L (ref 22–32)
pCO2 arterial: 29.2 mmHg — ABNORMAL LOW (ref 32.0–48.0)
pCO2 arterial: 31 mmHg — ABNORMAL LOW (ref 32.0–48.0)
pH, Arterial: 7.512 — ABNORMAL HIGH (ref 7.350–7.450)
pH, Arterial: 7.484 — ABNORMAL HIGH (ref 7.350–7.450)
pO2, Arterial: 83 mmHg (ref 83.0–108.0)
pO2, Arterial: 83 mmHg (ref 83.0–108.0)

## 2017-05-11 LAB — CBC
HCT: 25.7 % — ABNORMAL LOW (ref 39.0–52.0)
Hemoglobin: 9 g/dL — ABNORMAL LOW (ref 13.0–17.0)
MCH: 30.8 pg (ref 26.0–34.0)
MCHC: 35 g/dL (ref 30.0–36.0)
MCV: 88 fL (ref 78.0–100.0)
Platelets: 64 10*3/uL — ABNORMAL LOW (ref 150–400)
RBC: 2.92 MIL/uL — ABNORMAL LOW (ref 4.22–5.81)
RDW: 14.4 % (ref 11.5–15.5)
WBC: 12.2 10*3/uL — ABNORMAL HIGH (ref 4.0–10.5)

## 2017-05-11 MED ORDER — SODIUM CHLORIDE 0.9% FLUSH
10.0000 mL | Freq: Two times a day (BID) | INTRAVENOUS | Status: DC
  Administered 2017-05-11 – 2017-05-12 (×4): 10 mL
  Administered 2017-05-13: 40 mL
  Administered 2017-05-13 – 2017-05-14 (×2): 10 mL
  Administered 2017-05-15: 30 mL
  Administered 2017-05-15 – 2017-05-20 (×4): 10 mL

## 2017-05-11 MED ORDER — INSULIN ASPART 100 UNIT/ML ~~LOC~~ SOLN
0.0000 [IU] | SUBCUTANEOUS | Status: DC
  Administered 2017-05-11 – 2017-05-12 (×3): 2 [IU] via SUBCUTANEOUS
  Administered 2017-05-12: 4 [IU] via SUBCUTANEOUS
  Administered 2017-05-12: 2 [IU] via SUBCUTANEOUS
  Administered 2017-05-12: 4 [IU] via SUBCUTANEOUS
  Administered 2017-05-13 (×2): 2 [IU] via SUBCUTANEOUS
  Administered 2017-05-13: 4 [IU] via SUBCUTANEOUS

## 2017-05-11 MED ORDER — POTASSIUM CHLORIDE 10 MEQ/50ML IV SOLN
10.0000 meq | INTRAVENOUS | Status: AC
  Administered 2017-05-11 (×3): 10 meq via INTRAVENOUS
  Filled 2017-05-11 (×3): qty 50

## 2017-05-11 MED ORDER — SODIUM CHLORIDE 0.9% FLUSH
10.0000 mL | INTRAVENOUS | Status: DC | PRN
  Administered 2017-05-14 – 2017-05-20 (×5): 10 mL
  Filled 2017-05-11 (×5): qty 40

## 2017-05-11 MED ORDER — CHLORHEXIDINE GLUCONATE CLOTH 2 % EX PADS
6.0000 | MEDICATED_PAD | Freq: Every day | CUTANEOUS | Status: DC
  Administered 2017-05-11 – 2017-05-15 (×5): 6 via TOPICAL

## 2017-05-11 MED ORDER — ORAL CARE MOUTH RINSE
15.0000 mL | Freq: Two times a day (BID) | OROMUCOSAL | Status: DC
  Administered 2017-05-11 – 2017-05-18 (×7): 15 mL via OROMUCOSAL

## 2017-05-11 MED ORDER — PANTOPRAZOLE SODIUM 40 MG IV SOLR
40.0000 mg | INTRAVENOUS | Status: DC
  Administered 2017-05-11 – 2017-05-13 (×3): 40 mg via INTRAVENOUS
  Filled 2017-05-11 (×3): qty 40

## 2017-05-11 NOTE — Op Note (Signed)
NAME:  Don Medina, Don Medina                 ACCOUNT NO.:  MEDICAL RECORD NO.:  [id number]  LOCATION:                                 FACILITY:  PHYSICIAN:  Lanelle Bal, MD         DATE OF BIRTH:  DATE OF PROCEDURE:  05/08/2017 DATE OF DISCHARGE:                              OPERATIVE REPORT   PREOPERATIVE DIAGNOSIS:  Acute type 1 aortic dissection.  POSTOPERATIVE DIAGNOSIS:  Acute type 1 aortic dissection.  SURGICAL PROCEDURES: 1. Cardiopulmonary bypass with hypothermic circulatory arrest and     antegrade cerebral perfusion. 2. Resuspension, repair of aortic valve, replacement of ascending     aorta, arch with debranching of the cerebral vessels, frozen     elephant trunk with placement of 31 x 31 x 10 cm CTAG Gore stent     graft, placement of a 10 x 39 VBX balloon expandable stent into the     left subclavian artery, a 12 x 8-cm graft off the ascending aorta     to the innominate and left carotid arteries.  SURGEON:  Lanelle Bal, MD.  FIRST ASSISTANT:  Judeth Cornfield. Scot Dock, M.D.  BRIEF HISTORY:  The patient is a 76 year old male who, the evening prior to surgery while playing Dominoes with his family, had the sudden onset of diaphoresis, anterior chest pain radiating to his back.  He sought medical attention in the emergency room.  Initially, a rule out PE study was performed, but without definitive diagnosis.  Because of slight abnormalities in this, a repeat scan was performed with properly time contrast confirming a type 1 aortic dissection involving the ascending aorta, the origin of the innominate, the left carotid and slightly left subclavian and extending down to the aortic bifurcation.  The patient preoperatively was neurologically intact with palpable radial, brachial and pedal pulses bilaterally.  Immediate repair was recommended to the patient and his family, who agreed and signed informed consent.  DESCRIPTION OF PROCEDURE:  The patient was brought to  the operating room.  Bilateral arterial lines were placed, Swan-Ganz catheter was placed.  He underwent endotracheal general anesthesia by Dr. Gifford Shave.  A TEE probe was placed, this again confirmed extensive type 1 aortic dissection with severe aortic insufficiency.  The neck, chest and abdomen were prepped with Betadine and draped in usual sterile manner. Appropriate time-out was performed.  We first proceeded with an incision in the right deltopectoral groove separating the fibers of the pectoralis major, dividing the pectoralis minor, isolating the right axillary artery.  Vessel loops were placed for cannulation.  A median sternotomy was performed.  There was blood in the pericardium.  On examination of the heart, there was an obvious involvement of the ascending aorta.  We then systemically heparinized the patient.  Using a cannula graft, 8-mm, the right axillary artery was clamped proximally and distally, opened, and using a running 5-0 Prolene, the cannula graft was anastomosed to the axillary artery.  The vessel was allowed to backbleed, deairing the aortic cannula and attaching to the cardiopulmonary bypass.  Retrograde cardioplegia catheter was placed, dual-stage venous cannula was placed.  The patient was then placed on cardiopulmonary bypass.  Immediately,  right superior pulmonary vein vent was placed.  We then proceeded to cool the patient to 19 degrees.  Brisk monitoring was used prior to starting circulatory arrest.  The patient was given etomidate and steroids.  As we cooled, the innominate artery had been dissected out almost to the takeoff of the right carotid artery.  The left carotid artery was dissected out and encircled with a vessel loop.  With the patient's anatomy, the left subclavian artery was deep in the mediastinum.  Its location was evident, but was difficult to dissect out completely.  As we reached 18 degrees, the patient was placed in head-down position,  hypothermic circulatory arrest was begun, the innominate artery distally was clamped, and antegrade cerebral perfusion was begun.  With this, there was significant flow down the left carotid artery.  We then opened the aorta proximally just above the aortic valve.  The intimal tear was approximately mid ascending aorta, but the dissection with the intima intact, dissected down specially along the noncoronary cusp.  Examination of the aortic root and aorta appeared that it would be suitable for repair.  With this, we proceeded with the dissection and opening of the ascending aorta into the aortic arch.  Extensive dissection involving the takeoff of the cerebral vessels required replacement of the aortic arch.  We divided the innominate and the left carotid.  The aorta was transected just distal to the takeoff of the left carotid.  Through looking down the lumen, the takeoff of the left subclavian was evident.  At this point, we decided to proceed with a frozen elephant trunk repair, we could easily visualize the true lumen.  A 31 x 31 x 10-cm CTAG Gore stent was selected.  The stent was placed into the proximal descending aorta under direct vision, which easily confirmed being in the true lumen, the stent expanded nicely.  We then used an I-cautery, made approximating an 8-mm cut in the graft at the level of the left subclavian.  A 10-mm x 39-mm VBX-LSA balloon expandable stent was then selected and positioned through the Gore TAG device into the left subclavian artery leaving approximately 10 mm in the aortic lumen.  The stent was then expanded fully and seated well.  We then took a 4-0 Prolene and ran around the edge of the aorta through the proximal stent.  We then selected a 28-mm graft and anastomosed this over felt strip to the frozen elephant trunk. With this completed, we then trimmed the graft to appropriate length. The innominate and left carotid were then reconstructed using a  Gelweave 12 x 8-mm graft sacrificing one of the 8-mm arms.  The remaining 8-mm arm was trimmed to appropriate length and anastomosed to the left carotid artery.  The 10-mm arm was trimmed to the appropriate length. The 12-mm arm was then trimmed to the appropriate length and anastomosed with a running 8-0 Prolene to the innominate artery.  This graft was then anastomosed to the ascending aortic graft with a running 5-0 Prolene.  With the graft to the innominate and left carotid completed, the clamp on the innominate was removed allowing flow and de-airing of the graft.  The clamp on the left carotid was removed.  We then clamped the main ascending aortic graft and restored full circulation with cardiopulmonary bypass.  As we rewarmed, we turned our attention to the aortic valve in the proximal anastomosis.  Pledgeted 5-0 Prolene sutures were placed at each of the posts of the aortic valve commissures. BioGlue  was placed and used to reapproximate the aortic edges.  The aorta was divided just above the aortic valve commissures.  On examination of the valve, leaflets inferior intact, passive filling, showed apposition.  We then trimmed the graft to the appropriate length and over a felt strip, a running 4-0 Prolene was used to anastomose the graft to the aortic root.  Intermittently, cold blood cardioplegia retrograde had been administered during the procedure.  Heart was allowed to passively fill and as we completed the anastomosis, the graft was de-aired.  With the reconstruction completed, aortic crossclamp was removed and we continued to rewarm the patient, the bladder temperature of 37 degrees.  Retrograde cardioplegia catheter was removed.  The patient spontaneously converted to a sinus rhythm.  Right superior pulmonary vein vent was removed.  TEE showed good functioning of the aortic valve without aortic insufficiency.  With the patient's body temperature rewarmed, we then ventilated  and weaned the patient from cardiopulmonary bypass on low-dose dopamine and transiently on epinephrine.  He remained hemodynamically stable.  The venous cannula was removed.  Protamine sulfate was begun.  We then divided the graft anastomosed to the axillary artery with a vascular stapler.  Due to coagulopathy, the patient was administered packed red blood cells, fresh frozen, platelets, cryo, and factor VII.  The right arm and left arm arterial lines showed equal blood pressures with operative field hemostatic.  Atrial and ventricular pacing wires had been applied.  Two Blake drains were left in the mediastinum.  The pericardium was loosely reapproximated.  The sternum was closed with #6 stainless steel wire. Fascia was closed with interrupted 0 Vicryl, running 3-0 Vicryl in the subcutaneous tissue, 4-0 subcuticular stitch in skin edges.  Dry dressings were applied.  Sponge and needle count were reported as correct at completion of procedure.  The patient tolerated the procedure without obvious complication and was transferred to the Surgical Intensive Care Unit for further postoperative care.  Total pump time was 433 minutes.  Circulatory arrest, 217 minutes.  Antegrade cerebral perfusion, 217 minutes.  Crossclamp time 28 minutes.     Lanelle Bal, MD     EG/MEDQ  D:  05/10/2017  T:  05/11/2017  Job:  [id number]

## 2017-05-11 NOTE — Progress Notes (Signed)
Patient ID: Don Medina, male   DOB: 14-Jul-1941, 76 y.o.   MRN: [id number] T[facility] DAILY ICU PROGRESS NOTE                   [address]            [place] [address]          [phone]   3 Days Post-Op Procedure(s) (LRB): REPLACEMENT OF ASCENDING AORTIC ARCH (N/A) INTRAOPERATIVE TRANSESOPHAGEAL ECHOCARDIOGRAM (N/A) AXILLARY CANNULATION (Right) HYPOTHERMIC CIRCULATORY ARREST (N/A) REPLACEMENT ASCENDING AORTA (N/A) STENT GRAFTING OF DESCENDING THORACIC AORTA STENT OF LEFT SUBCLAVIAN ARTERY with 10x39VBX DEBRANCHING OF LEFT CAROTID ARTERY AND INNOMINATE ARTERY  Total Length of Stay:  LOS: 3 days   Subjective: Opens eyes to command and follows simple commands , became agitated when weaning vent yesterday    Objective: Vital signs in last 24 hours: Temp:  [96.6 F (35.9 C)-99.3 F (37.4 C)] 98.8 F (37.1 C) (10/03 0828) Pulse Rate:  [88-90] 89 (10/03 0700) Cardiac Rhythm: Atrial paced (10/03 0400) Resp:  [12] 12 (10/03 0814) BP: (89-154)/(59-99) 149/72 (10/03 0814) SpO2:  [94 %-100 %] 99 % (10/03 0700) Arterial Line BP: (80-179)/(25-113) 150/72 (10/03 0700) FiO2 (%):  [40 %] 40 % (10/03 0814) Weight:  [194 lb 7.1 oz (88.2 kg)] 194 lb 7.1 oz (88.2 kg) (10/03 0341)  Filed Weights   05/09/17 0430 05/10/17 0427 05/11/17 0341  Weight: 200 lb 2.8 oz (90.8 kg) 191 lb 12.8 oz (87 kg) 194 lb 7.1 oz (88.2 kg)    Weight change: 2 lb 10.3 oz (1.2 kg)   Hemodynamic parameters for last 24 hours: PAP: (15-30)/(6-19) 26/18 CO:  [3.2 L/min-4.4 L/min] 4.4 L/min CI:  [1.6 L/min/m2-2.3 L/min/m2] 2.3 L/min/m2  Intake/Output from previous day: 10/02 0701 - 10/03 0700 In: 2185.9 [I.V.:1805.9; NG/GT:180; IV Piggyback:200] Out: 2140 [Urine:1390; Emesis/NG output:500; Chest Tube:250]  Intake/Output this shift: Total I/O In: 100 [NG/GT:100] Out: 75 [Urine:75]  Current Meds: Scheduled Meds: . acetaminophen  1,000 mg Oral Q6H   Or  . acetaminophen (TYLENOL) oral  liquid 160 mg/5 mL  1,000 mg Per Tube Q6H  . bisacodyl  10 mg Oral Daily   Or  . bisacodyl  10 mg Rectal Daily  . chlorhexidine gluconate (MEDLINE KIT)  15 mL Mouth Rinse BID  . citalopram  20 mg Oral Daily  . docusate  200 mg Oral Daily  . insulin regular  0-10 Units Intravenous TID WC  . mouth rinse  15 mL Mouth Rinse QID  . metoprolol tartrate  12.5 mg Oral BID   Or  . metoprolol tartrate  12.5 mg Per Tube BID  . metoprolol tartrate  12.5 mg Oral Once  . pantoprazole (PROTONIX) IV  40 mg Intravenous Q24H  . simvastatin  20 mg Oral QHS  . sodium chloride flush  3 mL Intravenous Q12H   Continuous Infusions: . sodium chloride 20 mL/hr at 05/10/17 2000  . sodium chloride    . sodium chloride    . sodium chloride 10 mL/hr at 05/10/17 2000  . dexmedetomidine (PRECEDEX) IV infusion 0.6 mcg/kg/hr (05/11/17 0820)  . DOPamine 2 mcg/kg/min (05/10/17 2000)  . epinephrine Stopped (05/09/17 1100)  . insulin (NOVOLIN-R) infusion 0.5 Units/hr (05/11/17 0800)  . lactated ringers Stopped (05/09/17 2231)  . lactated ringers Stopped (05/09/17 2231)  . lactated ringers Stopped (05/09/17 2230)  . milrinone 0.3 mcg/kg/min (05/11/17 0133)  . nitroGLYCERIN 20 mcg/min (05/11/17 0840)  . phenylephrine (NEO-SYNEPHRINE) Adult infusion Stopped (05/08/17  2000)   PRN Meds:.sodium chloride, lactated ringers, metoprolol tartrate, midazolam, morphine injection, ondansetron (ZOFRAN) IV, oxyCODONE, sodium chloride flush, traMADol  General appearance: sedated  Neurologic: sedated  Heart: regular rate and rhythm, S1, S2 normal, no murmur, click, rub or gallop Lungs: diminished breath sounds bibasilar Abdomen: soft, non-tender; bowel sounds normal; no masses,  no organomegaly Extremities: extremities normal, atraumatic, no cyanosis or edema and Homans sign is negative, no sign of DVT Wound: intact   Lab Results: CBC: Recent Labs  05/10/17 0350 05/11/17 0322  WBC 12.6* 12.2*  HGB 9.5* 9.0*  HCT 27.1*  25.7*  PLT 68* 64*   BMET:  Recent Labs  05/10/17 0350 05/11/17 0322  NA 142 139  K 3.5 3.1*  CL 110 110  CO2 26 22  GLUCOSE 84 109*  BUN 27* 28*  CREATININE 1.43* 1.13  CALCIUM 7.8* 7.6*    CMET: Lab Results  Component Value Date   WBC 12.2 (H) 05/11/2017   HGB 9.0 (L) 05/11/2017   HCT 25.7 (L) 05/11/2017   PLT 64 (L) 05/11/2017   GLUCOSE 109 (H) 05/11/2017   CHOL 128 12/09/2015   TRIG 58 12/09/2015   HDL 60 12/09/2015   LDLCALC 56 12/09/2015   ALT 116 (H) 05/10/2017   AST 128 (H) 05/10/2017   NA 139 05/11/2017   K 3.1 (L) 05/11/2017   CL 110 05/11/2017   CREATININE 1.13 05/11/2017   BUN 28 (H) 05/11/2017   CO2 22 05/11/2017   TSH 2.00 08/23/2013   PSA 1.40 08/23/2013   INR 1.05 05/08/2017   HGBA1C 6.1 (H) 12/09/2015      PT/INR:  Recent Labs  05/08/17 1700  LABPROT 13.6  INR 1.05   Radiology: Dg Chest Port 1 View  Result Date: 05/11/2017 CLINICAL DATA:  Hypoxia EXAM: PORTABLE CHEST 1 VIEW COMPARISON:  May 10, 2017 FINDINGS: Endotracheal tube tip is 4.4 cm above the carina. Nasogastric tube tip and side port are below the diaphragm. Swan-Ganz catheter tip is in the main pulmonary outflow tract, stable. No evident pneumothorax. There is left lower lobe consolidation with small left pleural effusion. There is a small right pleural effusion with mild right base atelectasis. No new parenchymal lung opacity. Heart is mildly enlarged, stable. Stent graft noted in the aortic arch region. Proximal left subclavian artery stent noted as well. No evident bone lesions. No evident adenopathy. IMPRESSION: Tube and catheter positions as described without evident pneumothorax. Persistent left lower lobe consolidation. Mild right base atelectasis. Small pleural effusions bilaterally. Stable cardiac silhouette. Electronically Signed   By: Lowella Grip III M.D.   On: 05/11/2017 07:59   Dg Chest Port 1 View  Result Date: 05/10/2017 CLINICAL DATA:  Increased patient  activity this morning with possible dislodging of the endotracheal tube. EXAM: PORTABLE CHEST 1 VIEW COMPARISON:  Portable chest x-ray of earlier today. FINDINGS: The endotracheal tube tip lies approximately 5.2 cm above the carina. The Swan-Ganz catheter is in stable position. The esophagogastric tube appears to be in normal position where visualized. The heart is top-normal in size. The pulmonary vascularity is normal. Are tiny bilateral pleural effusions. IMPRESSION: Reasonable positioning of the endotracheal tube approximately 5.2 cm above the carina. Electronically Signed   By: David  Martinique M.D.   On: 05/10/2017 11:59   coox 68  Assessment/Plan: S/P Procedure(s) (LRB): REPLACEMENT OF ASCENDING AORTIC ARCH (N/A) INTRAOPERATIVE TRANSESOPHAGEAL ECHOCARDIOGRAM (N/A) AXILLARY CANNULATION (Right) HYPOTHERMIC CIRCULATORY ARREST (N/A) REPLACEMENT ASCENDING AORTA (N/A) STENT GRAFTING OF DESCENDING THORACIC AORTA STENT  OF LEFT SUBCLAVIAN ARTERY with 10x39VBX DEBRANCHING OF LEFT CAROTID ARTERY AND INNOMINATE ARTERY Wean sedation and vent as tolerated  Renal function stable  Wean off dopamine , continue low dose milrinone  Avoid heparin, plts low  Leave foley while on vent  Will need nutrition soon if not extubated   [name] 05/11/2017 8:44 AM

## 2017-05-11 NOTE — Progress Notes (Signed)
Peripherally Inserted Central Catheter/Midline Placement  The IV Nurse has discussed with the patient and/or persons authorized to consent for the patient, the purpose of this procedure and the potential benefits and risks involved with this procedure.  The benefits include less needle sticks, lab draws from the catheter, and the patient may be discharged home with the catheter. Risks include, but not limited to, infection, bleeding, blood clot (thrombus formation), and puncture of an artery; nerve damage and irregular heartbeat and possibility to perform a PICC exchange if needed/ordered by physician.  Alternatives to this procedure were also discussed.  Bard Power PICC patient education guide, fact sheet on infection prevention and patient information card has been provided to patient /or left at bedside.    PICC/Midline Placement Documentation        Don Medina 05/11/2017, 2:15 PM

## 2017-05-11 NOTE — Progress Notes (Signed)
Patient extubated per SICU wean protocol at this time. NIF -25, VC 1.0L. Positive cuff leak. Able to vocalize and clear secretions. IS at beside

## 2017-05-11 NOTE — Plan of Care (Signed)
Problem: Nutritional: Goal: Risk for body nutrition deficit will decrease Outcome: Progressing Adv diet to clears

## 2017-05-11 NOTE — Progress Notes (Signed)
Initiated Open heart Rapid Wean per Protocol

## 2017-05-11 NOTE — Progress Notes (Signed)
Patient ID: Don Medina, male   DOB: [birth year], 76 y.o.   MRN: [id number] T[facility] evening rounds:  Hemodynamically stable on milrinone 0.3 Dopamine is off Atrial paced at 90 Sitting up in chair. Awake and alert Urine output ok. Bowels working

## 2017-05-11 NOTE — Plan of Care (Signed)
Problem: Activity: Goal: Risk for activity intolerance will decrease Outcome: Not Progressing Patient remains intubated, unable to progress activity at this time.   Problem: Cardiac: Goal: Hemodynamic stability will improve Outcome: Progressing Patient remains on inotropic support, pacing wires intact.  Goal: Ability to maintain an adequate cardiac output will improve Outcome: Progressing CO/CI this shift have been >1.8.  Goal: Will show no signs and symptoms of excessive bleeding Outcome: Progressing No evidence of bleeding from chest tubes, foley, OG or any other source.  Problem: Respiratory: Goal: Ability to tolerate decreased levels of ventilator support will improve Outcome: Not Progressing Patient unable to wean at this time due to agitation.

## 2017-05-11 NOTE — Progress Notes (Signed)
Placed on CPAP/PS at [phone]

## 2017-05-11 NOTE — Plan of Care (Signed)
Problem: Respiratory: Goal: Ability to tolerate decreased levels of ventilator support will improve Outcome: Completed/Met Date Met: 05/11/17 Patient extubated to [place]

## 2017-05-12 ENCOUNTER — Inpatient Hospital Stay (HOSPITAL_COMMUNITY): Payer: Medicare HMO

## 2017-05-12 DIAGNOSIS — Z9889 Other specified postprocedural states: Secondary | ICD-10-CM

## 2017-05-12 LAB — TYPE AND SCREEN
ABO/RH(D): A POS
ANTIBODY SCREEN: NEGATIVE
UNIT DIVISION: 0
UNIT DIVISION: 0
UNIT DIVISION: 0
Unit division: 0
Unit division: 0
Unit division: 0

## 2017-05-12 LAB — GLUCOSE, CAPILLARY
Glucose-Capillary: 95 mg/dL (ref 65–99)
Glucose-Capillary: 123 mg/dL — ABNORMAL HIGH (ref 65–99)
Glucose-Capillary: 172 mg/dL — ABNORMAL HIGH (ref 65–99)
Glucose-Capillary: 176 mg/dL — ABNORMAL HIGH (ref 65–99)
Glucose-Capillary: 150 mg/dL — ABNORMAL HIGH (ref 65–99)
Glucose-Capillary: 140 mg/dL — ABNORMAL HIGH (ref 65–99)

## 2017-05-12 LAB — BASIC METABOLIC PANEL
Anion gap: 9 (ref 5–15)
BUN: 21 mg/dL — ABNORMAL HIGH (ref 6–20)
CO2: 22 mmol/L (ref 22–32)
Calcium: 7.5 mg/dL — ABNORMAL LOW (ref 8.9–10.3)
Chloride: 109 mmol/L (ref 101–111)
Creatinine, Ser: 1.1 mg/dL (ref 0.61–1.24)
GFR calc Af Amer: >60 mL/min (ref >60)
GFR calc non Af Amer: >60 mL/min (ref >60)
Glucose, Bld: 143 mg/dL — ABNORMAL HIGH (ref 65–99)
Potassium: 3.1 mmol/L — ABNORMAL LOW (ref 3.5–5.1)
Sodium: 140 mmol/L (ref 135–145)

## 2017-05-12 LAB — BPAM RBC
BLOOD PRODUCT EXPIRATION DATE: 201810102359
BLOOD PRODUCT EXPIRATION DATE: 201810162359
BLOOD PRODUCT EXPIRATION DATE: 201810182359
Blood Product Expiration Date: 201810112359
Blood Product Expiration Date: 201810162359
Blood Product Expiration Date: 201810182359
ISSUE DATE / TIME: 201809300428
ISSUE DATE / TIME: 201809300428
ISSUE DATE / TIME: 201810011028
UNIT TYPE AND RH: 5100
UNIT TYPE AND RH: 5100
UNIT TYPE AND RH: 6200
UNIT TYPE AND RH: 6200
UNIT TYPE AND RH: 6200
UNIT TYPE AND RH: 6200

## 2017-05-12 LAB — CBC
HCT: 24.7 % — ABNORMAL LOW (ref 39.0–52.0)
Hemoglobin: 8.5 g/dL — ABNORMAL LOW (ref 13.0–17.0)
MCH: 31 pg (ref 26.0–34.0)
MCHC: 34.4 g/dL (ref 30.0–36.0)
MCV: 90.1 fL (ref 78.0–100.0)
Platelets: 79 10*3/uL — ABNORMAL LOW (ref 150–400)
RBC: 2.74 MIL/uL — ABNORMAL LOW (ref 4.22–5.81)
RDW: 15 % (ref 11.5–15.5)
WBC: 12.2 10*3/uL — ABNORMAL HIGH (ref 4.0–10.5)

## 2017-05-12 MED ORDER — FUROSEMIDE 10 MG/ML IJ SOLN
20.0000 mg | Freq: Once | INTRAMUSCULAR | Status: AC
  Administered 2017-05-12: 20 mg via INTRAVENOUS
  Filled 2017-05-12: qty 2

## 2017-05-12 MED ORDER — POTASSIUM CHLORIDE CRYS ER 20 MEQ PO TBCR
20.0000 meq | EXTENDED_RELEASE_TABLET | Freq: Two times a day (BID) | ORAL | Status: AC
  Administered 2017-05-12 (×2): 20 meq via ORAL
  Filled 2017-05-12 (×2): qty 1

## 2017-05-12 MED ORDER — FUROSEMIDE 10 MG/ML IJ SOLN
40.0000 mg | Freq: Once | INTRAMUSCULAR | Status: AC
  Administered 2017-05-12: 40 mg via INTRAVENOUS
  Filled 2017-05-12: qty 4

## 2017-05-12 MED ORDER — POTASSIUM CHLORIDE 10 MEQ/50ML IV SOLN
10.0000 meq | INTRAVENOUS | Status: AC | PRN
  Administered 2017-05-12 – 2017-05-13 (×3): 10 meq via INTRAVENOUS
  Filled 2017-05-12 (×3): qty 50

## 2017-05-12 NOTE — Progress Notes (Signed)
Patient ID: Don Medina, male   DOB: Jan 21, 1941, 76 y.o.   MRN: [id number] EVENING ROUNDS NOTE :     [address]       [place] [address]             [phone]                 4 Days Post-Op Procedure(s) (LRB): REPLACEMENT OF ASCENDING AORTIC ARCH (N/A) INTRAOPERATIVE TRANSESOPHAGEAL ECHOCARDIOGRAM (N/A) AXILLARY CANNULATION (Right) HYPOTHERMIC CIRCULATORY ARREST (N/A) REPLACEMENT ASCENDING AORTA (N/A) STENT GRAFTING OF DESCENDING THORACIC AORTA STENT OF LEFT SUBCLAVIAN ARTERY with 10x39VBX DEBRANCHING OF LEFT CAROTID ARTERY AND INNOMINATE ARTERY  Total Length of Stay:  LOS: 4 days  BP 130/87   Pulse 98   Temp 97.7 F (36.5 C) (Oral)   Resp 19   Ht 5\' 8"  (1.727 m)   Wt 192 lb 7.4 oz (87.3 kg)   SpO2 97%   BMI 29.26 kg/m   .Intake/Output      10/04 0701 - 10/05 0700   P.O. 730   I.V. (mL/kg) 181 (2.1)   IV Piggyback 50   Total Intake(mL/kg) 961 (11)   Urine (mL/kg/hr) 1200 (1.1)   Total Output 1200   Net -239         . sodium chloride Stopped (05/11/17 1500)  . sodium chloride    . sodium chloride    . sodium chloride Stopped (05/12/17 1801)  . dexmedetomidine (PRECEDEX) IV infusion Stopped (05/11/17 0953)  . insulin (NOVOLIN-R) infusion Stopped (05/11/17 1312)  . lactated ringers Stopped (05/09/17 2231)  . lactated ringers Stopped (05/09/17 2231)  . lactated ringers Stopped (05/09/17 2230)  . nitroGLYCERIN Stopped (05/11/17 1207)  . phenylephrine (NEO-SYNEPHRINE) Adult infusion Stopped (05/08/17 2000)  . potassium chloride Stopped (05/12/17 0839)     Lab Results  Component Value Date   WBC 12.2 (H) 05/12/2017   HGB 8.5 (L) 05/12/2017   HCT 24.7 (L) 05/12/2017   PLT 79 (L) 05/12/2017   GLUCOSE 143 (H) 05/12/2017   CHOL 128 12/09/2015   TRIG 58 12/09/2015   HDL 60 12/09/2015   LDLCALC 56 12/09/2015   ALT 116 (H) 05/10/2017   AST 128 (H) 05/10/2017   NA 140 05/12/2017   K 3.1 (L) 05/12/2017   CL 109 05/12/2017   CREATININE 1.10 05/12/2017   BUN 21 (H) 05/12/2017   CO2 22 05/12/2017   TSH 2.00 08/23/2013   PSA 1.40 08/23/2013   INR 1.05 05/08/2017   HGBA1C 6.1 (H) 12/09/2015   Sinus 90, pacer turned down  Off milrinone , plts increasing  Replacing kcl  [name] MD  Beeper [phone] Office [phone] 05/12/2017 7:26 PM

## 2017-05-12 NOTE — Plan of Care (Signed)
Problem: Respiratory: Goal: Respiratory status will improve Outcome: Progressing Pt on 3L [place] using IS 1000 achieved.

## 2017-05-12 NOTE — Consult Note (Signed)
Cardiology Consult    Patient ID: [name]; [id number]; 1941-01-19   Admit date: 05/07/2017 Date of Consult: 05/12/2017  Primary Care Provider: Eulas Post, MD Primary Cardiologist: Previously followed by Dr. Lovena Le in 2012  Patient Profile    Don Medina is a 76 y.o. male with past medical history of HLD and PVC's who is being seen today for the evaluation following a Type A Aortic Dissection (s/p repair) at the request of Dr. Servando Snare.   History of Present Illness    Mr. Don Medina presented to [facility] ED on 05/07/2017 for evaluation of new-onset, sharp chest discomfort. Pain was noted to be worse with inspiration and did not improve with SL NTG. BP was at 124/52 upon initial check. D-dimer was elevated but initial CTA chest was negative for PE but did show a thoracic aortic aneurysm measuring 5cm with soft tissue stranding. A repeat abdominal CTA was obtained and showed a complex type A aortic dissection extending from the aortic root to aortic bifurcation with the dissection flap approaching the margin of coronary sinuses but not extending into the coronary arteries. The dissection flap extended into proximal right brachiocephalic artery, left subclavian artery, left common carotid artery, and proximal common iliac arteries bilaterally. AAA up to 5.3 cm.  He was taken to the OR by Dr. Servando Snare and underwent repair of the dissection with replacement of the ascending aortic arch, ascending aorta using a 28x30 Hemashield Platinum Graft, stent grafting of the descending thoracic aorta, stenting of the descending thoracic aorta, stenting of the left subclavian, and debraching of the left carotid artery. Underwent repair of the aortic valve with re suspension.    No complications were noted during the procedure. He was hypotensive in the post-op setting and required Epinephrine and Dopamine, with these having now been discontinued. On Milrinone 0.3 mcg/kg/min and receiving  IV Lasix. Was able to be weaned off the vent with successful extubation on 05/11/2017. Maintaining A-pacing with HR in the 90's on telemetry. Most recent labs showed WBC of 12.2, Hgb 8.5, K+ 3.1 (replacement given), creatinine 1.10. BP variable at 113/70 - 160/95 within the past 24 hours. Has been started on Lopressor 12.5mg  BID.   In talking with the patient, he reports having back pain at this time which he feels like is due to being in bed for an extended period of time. Pain is improved with positional changes. Not similar to his presenting symptoms.   He was very active prior to admission, going to the gym 5-6 days per week and denying any anginal symptoms with this. No known history of CAD, MI's, HTN, HLD, or Type 2 DM. No prior tobacco use. Had previously been on BB therapy for PVC's but was unable to tolerate this secondary to bradycardia and fatigue.  Past Medical History   Past Medical History:  Diagnosis Date  . Arthritis   . Blood in the stool   . Chicken pox   . Hay fever   . History of colonic polyps   . History of colonoscopy   . Hyperlipidemia      Allergies:   No Known Allergies  Home Medications:   Home Medications:  Prior to Admission medications   Medication Sig Start Date End Date Taking? Authorizing Provider  aspirin 81 MG tablet Take 81 mg by mouth daily.   Yes [provider]  aspirin EC 325 MG tablet Take 325 mg by mouth once as needed (for chest pain).   Yes  [provider]  citalopram (CELEXA) 20 MG tablet TAKE ONE TABLET BY MOUTH ONCE DAILY Patient taking differently: Take 20 mg by mouth once a day 03/07/17  Yes Burchette, Alinda Sierras, MD  desonide (DESOWEN) 0.05 % cream Apply 1 application topically daily as needed (for facial flares).    Yes [provider]  folic acid (FOLVITE) 1 MG tablet Take 1 mg by mouth daily.     Yes [provider]  meloxicam (MOBIC) 7.5 MG tablet Take 7.5 mg by mouth daily as needed (for shoulder  pain).  02/17/17  Yes [provider]  Misc Natural Products (GLUCOS-CHONDROIT-MSM COMPLEX) TABS Take 1 tablet by mouth 3 (three) times daily.   Yes [provider]  Omega-3 Fatty Acids (FISH OIL) 1200 MG CAPS Take 1,200 mg by mouth 2 (two) times daily.    Yes [provider]  simvastatin (ZOCOR) 20 MG tablet TAKE ONE TABLET BY MOUTH AT BEDTIME Patient taking differently: Take 20 mg by mouth at bedtime 03/07/17  Yes Burchette, Alinda Sierras, MD    Inpatient Medications    Scheduled Meds: . acetaminophen  1,000 mg Oral Q6H   Or  . acetaminophen (TYLENOL) oral liquid 160 mg/5 mL  1,000 mg Per Tube Q6H  . bisacodyl  10 mg Oral Daily   Or  . bisacodyl  10 mg Rectal Daily  . Chlorhexidine Gluconate Cloth  6 each Topical Daily  . citalopram  20 mg Oral Daily  . docusate  200 mg Oral Daily  . furosemide  20 mg Intravenous Once  . insulin aspart  0-24 Units Subcutaneous Q4H  . mouth rinse  15 mL Mouth Rinse BID  . metoprolol tartrate  12.5 mg Oral BID   Or  . metoprolol tartrate  12.5 mg Per Tube BID  . metoprolol tartrate  12.5 mg Oral Once  . pantoprazole (PROTONIX) IV  40 mg Intravenous Q24H  . potassium chloride  20 mEq Oral BID  . simvastatin  20 mg Oral QHS  . sodium chloride flush  10-40 mL Intracatheter Q12H  . sodium chloride flush  3 mL Intravenous Q12H   Continuous Infusions: . sodium chloride Stopped (05/11/17 1500)  . sodium chloride    . sodium chloride    . sodium chloride 10 mL/hr at 05/12/17 0600  . dexmedetomidine (PRECEDEX) IV infusion Stopped (05/11/17 0953)  . insulin (NOVOLIN-R) infusion Stopped (05/11/17 1312)  . lactated ringers Stopped (05/09/17 2231)  . lactated ringers Stopped (05/09/17 2231)  . lactated ringers Stopped (05/09/17 2230)  . milrinone 0.3 mcg/kg/min (05/12/17 1116)  . nitroGLYCERIN Stopped (05/11/17 1207)  . phenylephrine (NEO-SYNEPHRINE) Adult infusion Stopped (05/08/17 2000)  . potassium chloride Stopped (05/12/17  0839)   PRN Meds: sodium chloride, lactated ringers, metoprolol tartrate, midazolam, morphine injection, ondansetron (ZOFRAN) IV, oxyCODONE, potassium chloride, sodium chloride flush, sodium chloride flush, traMADol  Family History    Family History  Problem Relation Age of Onset  . Alzheimer's disease Father 9  . Dementia Father   . Stroke Mother 63  . Cancer Brother        Leukemia  . Sudden death Other        family  . Hypertension Unknown   . Heart disease Neg Hx     Social History    Social History   Social History  . Marital status: Married    Spouse name: N/A  . Number of children: N/A  . Years of education: N/A   Occupational History  .  Retired    Social History Main Topics  . Smoking status: Former Smoker    Quit date: 08/09/1974  . Smokeless tobacco: Never Used  . Alcohol use No  . Drug use: No  . Sexual activity: Not on file   Other Topics Concern  . Not on file   Social History Narrative  . No narrative on file     Review of Systems    General:  No chills, fever, night sweats or weight changes.  Cardiovascular:  No  dyspnea on exertion, edema, orthopnea, palpitations, paroxysmal nocturnal dyspnea. Positive for chest pain and back pain.  Dermatological: No rash, lesions/masses Respiratory: No cough, dyspnea Urologic: No hematuria, dysuria Abdominal:   No nausea, vomiting, diarrhea, bright red blood per rectum, melena, or hematemesis Neurologic:  No visual changes, wkns, changes in mental status.  All other systems reviewed and are otherwise negative except as noted above.  Physical Exam/Data    Blood pressure (!) 147/80, pulse 90, temperature 97.7 F (36.5 C), temperature source Oral, resp. rate 20, height 5\' 8"  (1.727 m), weight 192 lb 7.4 oz (87.3 kg), SpO2 96 %.  General: Pleasant, Caucasian male appearing in NAD Psych: Normal affect. Neuro: Alert and oriented X 3. Moves all extremities spontaneously. HEENT: Normal  Neck: Supple without  bruits. JVD at 9cm. Lungs:  Resp regular and unlabored, CTA with wheezing or rales. Heart: RRR no s3, s4, or murmurs. Sternal wound appears well-healing with no drainage noted.  Abdomen: Soft, non-tender, non-distended, BS + x 4.  Extremities: No clubbing or cyanosis. 1+ pitting edema up to mid-shins bilaterally. DP/PT/Radials 2+ and equal bilaterally.   EKG:  The EKG was personally reviewed and demonstrates: NSR, HR 62, with anterior TWI.  Telemetry:  Telemetry was personally reviewed and demonstrates:  A-pacing, HR 90.   Labs/Studies     Relevant CV Studies:  TEE: 05/08/2017  Left ventricle: Normal cavity size and wall thickness. LV systolic function is normal with an EF of 55-60%. There are no obvious wall motion abnormalities.  Aortic valve: The valve is trileaflet. Severe regurgitation.  Aorta: The ascending aorta is dilated. 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  Right ventricle: Cavity is moderately to severely dilated. Moderately reduced systolic function.  Right atrium: Cavity is dilated.  Aorta: Graft present in the ascending aorta.   Echocardiogram: 12/2015 Study Conclusions  - Left ventricle: The cavity size was normal. There was mild   concentric hypertrophy. Systolic function was normal. The   estimated ejection fraction was in the range of 55% to 60%. Wall   motion was normal; there were no regional wall motion   abnormalities. The study was not technically sufficient to allow   evaluation of LV diastolic dysfunction due to frequent   ventricular ectopy - Aortic valve: Mildly calcified annulus. Trileaflet; normal   thickness, mildly calcified leaflets. - Mitral valve: Calcified annulus. - Right ventricle: The cavity size was moderately dilated. Wall   thickness was normal. Systolic function was mildly to moderately   reduced. - Impressions: Copmared to prior study, RV dimensions have   increased and RV function  appears mildly reduced.  Impressions:  - Copmared to prior study, RV dimensions have increased and RV   function appears mildly reduced.   Laboratory Data:  Chemistry  Recent Labs Lab 05/10/17 0350 05/11/17 0322 05/12/17 0452  NA 142 139 140  K 3.5 3.1* 3.1*  CL 110 110 109  CO2 26 22 22  GLUCOSE 84 109* 143*  BUN 27* 28* 21*  CREATININE 1.43* 1.13 1.10  CALCIUM 7.8* 7.6* 7.5*  GFRNONAA 46* >60 >60  GFRAA 54* >60 >60  ANIONGAP 6 7 9      Recent Labs Lab 05/10/17 0350  PROT 4.5*  ALBUMIN 2.8*  AST 128*  ALT 116*  ALKPHOS 31*  BILITOT 1.3*   Hematology  Recent Labs Lab 05/10/17 0350 05/11/17 0322 05/12/17 0452  WBC 12.6* 12.2* 12.2*  RBC 3.10* 2.92* 2.74*  HGB 9.5* 9.0* 8.5*  HCT 27.1* 25.7* 24.7*  MCV 87.4 88.0 90.1  MCH 30.6 30.8 31.0  MCHC 35.1 35.0 34.4  RDW 15.1 14.4 15.0  PLT 68* 64* 79*   Cardiac EnzymesNo results for input(s): TROPONINI in the last 168 hours.   Recent Labs Lab 05/07/17 2156  TROPIPOC 0.00    BNPNo results for input(s): BNP, PROBNP in the last 168 hours.  DDimer   Recent Labs Lab 05/07/17 2149  DDIMER 7.98*    Radiology/Studies:  Dg Chest Port 1 View  Result Date: 05/12/2017 CLINICAL DATA:  Status post endovascular stent graft placement in the ascending aorta on May 08, 2017. EXAM: PORTABLE CHEST 1 VIEW COMPARISON:  Chest x-ray of May 11, 2017 FINDINGS: The lungs are well-expanded. There has been improvement in the appearance of the retrocardiac region and there is slightly less obscuration of the left hemidiaphragm. There is no pneumothorax. Atelectasis at the right lung base has nearly totally cleared. The heart is mildly enlarged. The pulmonary vascularity is normal. The ascending aortic stent graft is in stable position. The sternal wires are intact. The right-sided PICC line tip projects at the junction of the middle and distal thirds of the SVC. IMPRESSION: Improving left lower lobe atelectasis and  small pleural effusion. Interval clearing of right basilar atelectasis. No pneumothorax. No pulmonary edema. Electronically Signed   By: David  Martinique M.D.   On: 05/12/2017 07:42   Dg Chest Port 1 View  Result Date: 05/11/2017 CLINICAL DATA:  Hypoxia EXAM: PORTABLE CHEST 1 VIEW COMPARISON:  May 10, 2017 FINDINGS: Endotracheal tube tip is 4.4 cm above the carina. Nasogastric tube tip and side port are below the diaphragm. Swan-Ganz catheter tip is in the main pulmonary outflow tract, stable. No evident pneumothorax. There is left lower lobe consolidation with small left pleural effusion. There is a small right pleural effusion with mild right base atelectasis. No new parenchymal lung opacity. Heart is mildly enlarged, stable. Stent graft noted in the aortic arch region. Proximal left subclavian artery stent noted as well. No evident bone lesions. No evident adenopathy. IMPRESSION: Tube and catheter positions as described without evident pneumothorax. Persistent left lower lobe consolidation. Mild right base atelectasis. Small pleural effusions bilaterally. Stable cardiac silhouette. Electronically Signed   By: Lowella Grip III M.D.   On: 05/11/2017 07:59   Dg Chest Port 1 View  Result Date: 05/10/2017 CLINICAL DATA:  Increased patient activity this morning with possible dislodging of the endotracheal tube. EXAM: PORTABLE CHEST 1 VIEW COMPARISON:  Portable chest x-ray of earlier today. FINDINGS: The endotracheal tube tip lies approximately 5.2 cm above the carina. The Swan-Ganz catheter is in stable position. The esophagogastric tube appears to be in normal position where visualized. The heart is top-normal in size. The pulmonary vascularity is normal. Are tiny bilateral pleural effusions. IMPRESSION: Reasonable positioning of the endotracheal tube approximately 5.2 cm above the carina. Electronically Signed   By: David  Martinique M.D.   On: 05/10/2017 11:59  Dg Chest Port 1 View  Result Date:  05/10/2017 CLINICAL DATA:  76 year old male post treatment aortic dissection. Subsequent encounter. EXAM: PORTABLE CHEST 1 VIEW COMPARISON:  05/09/2017. FINDINGS: Endotracheal tube tip 8.7 cm above the carina. Right-sided Swan-Ganz catheter tip pulmonary outflow tract. Nasogastric tube courses below the diaphragm. Tip is not included on the present exam. Post median sternotomy. Endovascular treatment aortic dissection. Stenting great vessel. Mediastinal drain in place. Size of aortic knob region similar to prior postoperative exam. Consolidation left base may represent postoperative atelectasis and pleural effusion. Mild consolidation right lung base may represent posteriorly layering pleural effusion with minimal basal atelectasis. Central pulmonary vascular prominence. No gross pneumothorax. IMPRESSION: Swan-Ganz catheter tip pulmonary outflow tract level without change. Post endovascular stenting aortic dissection. Consolidation retrocardiac region similar to prior exams suggestive of atelectasis and left-sided pleural effusion. Small right-sided pleural effusion may be present. Electronically Signed   By: Genia Del M.D.   On: 05/10/2017 07:50   Dg Chest Port 1 View  Result Date: 05/09/2017 CLINICAL DATA:  Endotracheal tube present EXAM: PORTABLE CHEST 1 VIEW COMPARISON:  Yesterday FINDINGS: Endotracheal tube tip is just below the clavicular heads. Shorter Swan-Ganz catheter, tip at the pulmonary artery outflow tract. An orogastric tube reaches the stomach. Thoracic drains are present. There are changes of aortic and great vessel stenting. The right lung shows mild hazy opacity. Extensive ill-defined opacity at the left base with probable small layering effusion. No visible pneumothorax. IMPRESSION: 1. Swan-Ganz catheter tip at the pulmonary artery outflow tract. Otherwise unchanged positioning of hardware. 2. Unchanged asymmetric left atelectasis and pleural fluid. Electronically Signed   By: Monte Fantasia M.D.   On: 05/09/2017 07:39   Dg Chest Port 1 View  Result Date: 05/08/2017 CLINICAL DATA:  Status post aortic dissection repair. EXAM: PORTABLE CHEST 1 VIEW COMPARISON:  Radiographs of May 07, 2017. FINDINGS: Stent graft is seen involving the transverse aortic arch and descending thoracic aorta. Sternotomy wires are noted. Endotracheal tube is seen projected over tracheal air shadow with distal tip 4 cm above the carina. Nasogastric tube is seen entering the stomach. Right internal jugular Swan-Ganz catheter is noted with distal tip directed toward right pulmonary artery. No pneumothorax is noted. Mild right upper lobe atelectasis or infiltrate is noted. Moderate left basilar atelectasis, is noted with associated pleural effusion. Bony thorax is unremarkable. IMPRESSION: Stent graft is noted involving transverse aortic arch and proximal descending thoracic aorta. Endotracheal and nasogastric tubes are in grossly good position. Mild right upper lobe atelectasis or infiltrate is noted. Moderate left basilar atelectasis is noted with associated pleural effusion. Electronically Signed   By: Marijo Conception, M.D.   On: 05/08/2017 18:19    Assessment & Plan    1. Type A Aortic Dissection - presented for evaluation of new-onset, sharp chest discomfort and found to have a complex type A aortic dissection. Taken to the OR on 9/30 by Dr. Servando Snare and underwent repair of the dissection with replacement of the ascending aortic arch, ascending aorta using a 28x30 Hemashield Platinum Graft, stent grafting of the descending thoracic aorta, stenting of the descending thoracic aorta, stenting of the left subclavian, debraching of the left carotid artery, and repair of the aortic valve with resuspension. - EF 55-60% by intraoperative TEE. Consider ischemic evaluation in the outpatient setting once further out from his surgical repair, granted he denies any anginal symptoms leading up to his presentation. -  has been extubated. Telemetry shows he is A-pacing. Epinephrine and  Dopamine discontinued. Remains on Milrinone 0.3 mcg/kg/min. Continue to wean. Still appears volume overloaded, therefore will write for additional IV Lasix at this time. Has been started on low-dose BB therapy.    2. Post-operative volume excess - weight at 176 lbs on admission, at 192 lbs today. Repeat CXR today shows improvement of the left pleural effusion with interval clearing of the right basilar atelectasis. - remains on Milrinone 0.3 mcg/kg/min. Receiving IVF at [facility] rate.  - he still has lower extremity edema on examination and elevated JVD. Recommend further diuresis and will write for additional IV Lasix 20mg .   3. Post-operative anemia - Hgb at 8.5 today. He denies any evidence of active bleeding. Continue to trend.    Signed, Erma Heritage, PA-C 05/12/2017, 4:59 PM Pager: [phone]  Patient examined chart reviewed Discussed care with patient, spouse and PA. I actually take care of his wife. Fortunate 76 y.o. male 4 days post complicated repair Of a type A dissection involving arch vessel debranching , aortic valve re suspension and distal stent grafting. Currently sitting in chair with some back pain. Still with Some volume overload On milrinone but dopamine d/c A pacing at 90. No antecedent history of HTN Was very healthy and goes to [facility] 6x/week No high risk Family history of dissection or aortopathy. Dissection occurred just above the coronary arteries and there has been no signs of ischemia.  No neurologic symptoms and normal  Renal function. On exam there is a rub but no AR murmur. Poor inspiratory effort and some edema especially in hands and feet. Would continue diuresis and wean milrinone  Hopefully can stop A pacing at higher rate in next 24 hours. Going forward will need further assessment of CAD although coronary arteries did not appear very calcified and filled Well on CT. Will also need to  follow aortic valve for AR. CVTS will need to follow residual false lumen and stent graft. Overall considering the extent of his surgical repair he is  Doing remarkably well  Jenkins Rouge

## 2017-05-12 NOTE — Progress Notes (Addendum)
Patient ID: Don Medina, male   DOB: 01-Jul-1941, 76 y.o.   MRN: [id number] TCTS DAILY ICU PROGRESS NOTE                   [address]            [place], [address]          [phone]   4 Days Post-Op Procedure(s) (LRB): REPLACEMENT OF ASCENDING AORTIC ARCH (N/A) INTRAOPERATIVE TRANSESOPHAGEAL ECHOCARDIOGRAM (N/A) AXILLARY CANNULATION (Right) HYPOTHERMIC CIRCULATORY ARREST (N/A) REPLACEMENT ASCENDING AORTA (N/A) STENT GRAFTING OF DESCENDING THORACIC AORTA STENT OF LEFT SUBCLAVIAN ARTERY with 10x39VBX DEBRANCHING OF LEFT CAROTID ARTERY AND INNOMINATE ARTERY  Total Length of Stay:  LOS: 4 days   Subjective: Awake and alert in chair , trouble sleeping    Objective: Vital signs in last 24 hours: Temp:  [98 F (36.7 C)-99.7 F (37.6 C)] 98 F (36.7 C) (10/04 0300) Pulse Rate:  [88-99] 89 (10/04 0800) Cardiac Rhythm: Atrial paced (10/04 0742) Resp:  [0-24] 21 (10/04 0800) BP: (97-151)/(57-95) 147/80 (10/04 0800) SpO2:  [93 %-100 %] 99 % (10/04 0800) Arterial Line BP: (129-140)/(54-70) 129/54 (10/03 1500) FiO2 (%):  [40 %] 40 % (10/03 0928) Weight:  [192 lb 7.4 oz (87.3 kg)] 192 lb 7.4 oz (87.3 kg) (10/04 0500)  Filed Weights   05/10/17 0427 05/11/17 0341 05/12/17 0500  Weight: 191 lb 12.8 oz (87 kg) 194 lb 7.1 oz (88.2 kg) 192 lb 7.4 oz (87.3 kg)    Weight change: -1 lb 15.7 oz (-0.9 kg)   Hemodynamic parameters for last 24 hours: PAP: (20-27)/(10-16) 24/10  Intake/Output from previous day: 10/03 0701 - 10/04 0700 In: 1067.5 [P.O.:300; I.V.:667.5; NG/GT:100] Out: 1125 [Urine:1055; Chest Tube:70]  Intake/Output this shift: Total I/O In: 57.8 [I.V.:7.8; IV Piggyback:50] Out: -   Current Meds: Scheduled Meds: . acetaminophen  1,000 mg Oral Q6H   Or  . acetaminophen (TYLENOL) oral liquid 160 mg/5 mL  1,000 mg Per Tube Q6H  . bisacodyl  10 mg Oral Daily   Or  . bisacodyl  10 mg Rectal Daily  . Chlorhexidine Gluconate Cloth  6 each  Topical Daily  . citalopram  20 mg Oral Daily  . docusate  200 mg Oral Daily  . insulin aspart  0-24 Units Subcutaneous Q4H  . mouth rinse  15 mL Mouth Rinse BID  . metoprolol tartrate  12.5 mg Oral BID   Or  . metoprolol tartrate  12.5 mg Per Tube BID  . metoprolol tartrate  12.5 mg Oral Once  . pantoprazole (PROTONIX) IV  40 mg Intravenous Q24H  . simvastatin  20 mg Oral QHS  . sodium chloride flush  10-40 mL Intracatheter Q12H  . sodium chloride flush  3 mL Intravenous Q12H   Continuous Infusions: . sodium chloride Stopped (05/11/17 1500)  . sodium chloride    . sodium chloride    . sodium chloride 10 mL/hr at 05/12/17 0600  . dexmedetomidine (PRECEDEX) IV infusion Stopped (05/11/17 0953)  . insulin (NOVOLIN-R) infusion Stopped (05/11/17 1312)  . lactated ringers Stopped (05/09/17 2231)  . lactated ringers Stopped (05/09/17 2231)  . lactated ringers Stopped (05/09/17 2230)  . milrinone 0.3 mcg/kg/min (05/12/17 0600)  . nitroGLYCERIN Stopped (05/11/17 1207)  . phenylephrine (NEO-SYNEPHRINE) Adult infusion Stopped (05/08/17 2000)  . potassium chloride 10 mEq (05/12/17 0733)   PRN Meds:.sodium chloride, lactated ringers, metoprolol tartrate, midazolam, morphine injection, ondansetron (ZOFRAN) IV, oxyCODONE, potassium chloride, sodium chloride flush, sodium chloride flush,  traMADol  General appearance: alert and cooperative Neurologic: intact Heart: regular rate and rhythm, S1, S2 normal, no murmur, click, rub or gallop Lungs: diminished breath sounds bibasilar Abdomen: soft, non-tender; bowel sounds normal; no masses,  no organomegaly Extremities: extremities normal, atraumatic, no cyanosis or edema and Homans sign is negative, no sign of DVT Wound: sternum intact  Lab Results: CBC: Recent Labs  05/11/17 0322 05/12/17 0452  WBC 12.2* 12.2*  HGB 9.0* 8.5*  HCT 25.7* 24.7*  PLT 64* 79*   BMET:  Recent Labs  05/11/17 0322 05/12/17 0452  NA 139 140  K 3.1* 3.1*    CL 110 109  CO2 22 22  GLUCOSE 109* 143*  BUN 28* 21*  CREATININE 1.13 1.10  CALCIUM 7.6* 7.5*    CMET: Lab Results  Component Value Date   WBC 12.2 (H) 05/12/2017   HGB 8.5 (L) 05/12/2017   HCT 24.7 (L) 05/12/2017   PLT 79 (L) 05/12/2017   GLUCOSE 143 (H) 05/12/2017   CHOL 128 12/09/2015   TRIG 58 12/09/2015   HDL 60 12/09/2015   LDLCALC 56 12/09/2015   ALT 116 (H) 05/10/2017   AST 128 (H) 05/10/2017   NA 140 05/12/2017   K 3.1 (L) 05/12/2017   CL 109 05/12/2017   CREATININE 1.10 05/12/2017   BUN 21 (H) 05/12/2017   CO2 22 05/12/2017   TSH 2.00 08/23/2013   PSA 1.40 08/23/2013   INR 1.05 05/08/2017   HGBA1C 6.1 (H) 12/09/2015      PT/INR: No results for input(s): LABPROT, INR in the last 72 hours. Radiology: Dg Chest Port 1 View  Result Date: 05/12/2017 CLINICAL DATA:  Status post endovascular stent graft placement in the ascending aorta on May 08, 2017. EXAM: PORTABLE CHEST 1 VIEW COMPARISON:  Chest x-ray of May 11, 2017 FINDINGS: The lungs are well-expanded. There has been improvement in the appearance of the retrocardiac region and there is slightly less obscuration of the left hemidiaphragm. There is no pneumothorax. Atelectasis at the right lung base has nearly totally cleared. The heart is mildly enlarged. The pulmonary vascularity is normal. The ascending aortic stent graft is in stable position. The sternal wires are intact. The right-sided PICC line tip projects at the junction of the middle and distal thirds of the SVC. IMPRESSION: Improving left lower lobe atelectasis and small pleural effusion. Interval clearing of right basilar atelectasis. No pneumothorax. No pulmonary edema. Electronically Signed   By: David  Martinique M.D.   On: 05/12/2017 07:42     Assessment/Plan: S/P Procedure(s) (LRB): REPLACEMENT OF ASCENDING AORTIC ARCH (N/A) INTRAOPERATIVE TRANSESOPHAGEAL ECHOCARDIOGRAM (N/A) AXILLARY CANNULATION (Right) HYPOTHERMIC CIRCULATORY ARREST  (N/A) REPLACEMENT ASCENDING AORTA (N/A) STENT GRAFTING OF DESCENDING THORACIC AORTA STENT OF LEFT SUBCLAVIAN ARTERY with 10x39VBX DEBRANCHING OF LEFT CAROTID ARTERY AND INNOMINATE ARTERY Mobilize Diuresis Slowly improving plts improving Wean off milrinone  Replacing kcl  [name] 05/12/2017 8:24 AM

## 2017-05-12 NOTE — Evaluation (Signed)
Physical Therapy Evaluation Patient Details Name: Don Medina MRN: [id number] DOB: 1941/04/02 Today's Date: 05/12/2017   History of Present Illness  76 yo admitted with ascending aortic dissection s/p ascending aortic arch replacement. PMhx: arthritis, HLD, back sx  Clinical Impression  Pt pleasant and moving well with ability to maintain sternal precautions with basic transfers and gait. Pt with decreased activity tolerance, gait, mobility and independence who will benefit from acute therapy to maximize function, adherence to precautions and mobility to decrease burden of care. Pt educated for all precautions with handout provided and encouraged further ambulation today with nursing.   HR 85-94 SpO2 88-98% on RA with activity    Follow Up Recommendations Home health PT    Equipment Recommendations  3in1 (PT)    Recommendations for Other Services       Precautions / Restrictions Precautions Precautions: Sternal      Mobility  Bed Mobility               General bed mobility comments: in chair on arrival  Transfers Overall transfer level: Needs assistance   Transfers: Sit to/from Stand Sit to Stand: Min guard         General transfer comment: cues for hand placement on thighs, safety and assist for scooting posteriorly  Ambulation/Gait Ambulation/Gait assistance: Min assist Ambulation Distance (Feet): 150 Feet Assistive device: Rolling walker (2 wheeled) Gait Pattern/deviations: Step-through pattern;Decreased stride length;Drifts right/left   Gait velocity interpretation: Below normal speed for age/gender General Gait Details: cues for midline as pt has tendency to veer left with gait despite cues. 2 standing rest breaks with cues for breathing as SpO2 dropped to 88% and recovered with standing  Stairs            Wheelchair Mobility    Modified Rankin (Stroke Patients Only)       Balance Overall balance assessment: No apparent balance  deficits (not formally assessed)                                           Pertinent Vitals/Pain Pain Assessment: No/denies pain    Home Living Family/patient expects to be discharged to:: Private residence Living Arrangements: Spouse/significant other Available Help at Discharge: Family;Available 24 hours/day Type of Home: House Home Access: Stairs to enter   [employer] of Steps: 2 Home Layout: One level Home Equipment: Environmental consultant - 2 wheels      Prior Function Level of Independence: Independent               Hand Dominance        Extremity/Trunk Assessment   Upper Extremity Assessment Upper Extremity Assessment: Overall WFL for tasks assessed    Lower Extremity Assessment Lower Extremity Assessment: Overall WFL for tasks assessed    Cervical / Trunk Assessment Cervical / Trunk Assessment: Normal  Communication   Communication: No difficulties  Cognition Arousal/Alertness: Awake/alert Behavior During Therapy: WFL for tasks assessed/performed Overall Cognitive Status: Within Functional Limits for tasks assessed                                        General Comments      Exercises     Assessment/Plan    PT Assessment Patient needs continued PT services  PT Problem List Decreased mobility;Decreased activity tolerance;Decreased  knowledge of use of DME;Decreased knowledge of precautions       PT Treatment Interventions Gait training;Therapeutic exercise;Patient/family education;DME instruction;Therapeutic activities;Stair training;Functional mobility training    PT Goals (Current goals can be found in the Care Plan section)  Acute Rehab PT Goals Patient Stated Goal: return home PT Goal Formulation: With patient Time For Goal Achievement: 05/26/17 Potential to Achieve Goals: Good    Frequency Min 3X/week   Barriers to discharge        Co-evaluation               AM-PAC PT "6 Clicks" Daily  Activity  Outcome Measure Difficulty turning over in bed (including adjusting bedclothes, sheets and blankets)?: A Lot Difficulty moving from lying on back to sitting on the side of the bed? : A Lot Difficulty sitting down on and standing up from a chair with arms (e.g., wheelchair, bedside commode, etc,.)?: A Little Help needed moving to and from a bed to chair (including a wheelchair)?: A Little Help needed walking in hospital room?: A Little Help needed climbing 3-5 steps with a railing? : A Little 6 Click Score: 16    End of Session Equipment Utilized During Treatment: Gait belt Activity Tolerance: Patient tolerated treatment well Patient left: in chair;with call bell/phone within reach Nurse Communication: Mobility status PT Visit Diagnosis: Difficulty in walking, not elsewhere classified (R26.2);Other abnormalities of gait and mobility (R26.89)    Time: 7846-9629 PT Time Calculation (min) (ACUTE ONLY): 23 min   Charges:   PT Evaluation $PT Eval Moderate Complexity: 1 Mod PT Treatments $Therapeutic Activity: 8-22 mins   PT G Codes:        Delaney Meigs, PT [phone]   Lori-Ann Lindfors B Laekyn Rayos 05/12/2017, 1:37 PM

## 2017-05-12 NOTE — Plan of Care (Signed)
Problem: Nutrition: Goal: Adequate nutrition will be maintained Outcome: Progressing Tolerated clears Pt advanced to heart healthy carb mod diet

## 2017-05-12 NOTE — Progress Notes (Signed)
Lab notified RN of "Factor 7 test" hemolyzed that was drawn on 9/30. Test was cancelled. Dr. Servando Snare made aware on PM rounds.

## 2017-05-13 ENCOUNTER — Other Ambulatory Visit: Payer: Self-pay | Admitting: Cardiothoracic Surgery

## 2017-05-13 DIAGNOSIS — I71019 Dissection of thoracic aorta, unspecified: Secondary | ICD-10-CM

## 2017-05-13 DIAGNOSIS — I7101 Dissection of thoracic aorta: Secondary | ICD-10-CM

## 2017-05-13 LAB — CBC
HCT: 28.4 % — ABNORMAL LOW (ref 39.0–52.0)
Hemoglobin: 9.8 g/dL — ABNORMAL LOW (ref 13.0–17.0)
MCH: 31.1 pg (ref 26.0–34.0)
MCHC: 34.5 g/dL (ref 30.0–36.0)
MCV: 90.2 fL (ref 78.0–100.0)
Platelets: 112 10*3/uL — ABNORMAL LOW (ref 150–400)
RBC: 3.15 MIL/uL — ABNORMAL LOW (ref 4.22–5.81)
RDW: 15.2 % (ref 11.5–15.5)
WBC: 13.4 10*3/uL — ABNORMAL HIGH (ref 4.0–10.5)

## 2017-05-13 LAB — BASIC METABOLIC PANEL
Anion gap: 3 — ABNORMAL LOW (ref 5–15)
BUN: 20 mg/dL (ref 6–20)
CO2: 26 mmol/L (ref 22–32)
Calcium: 7.9 mg/dL — ABNORMAL LOW (ref 8.9–10.3)
Chloride: 109 mmol/L (ref 101–111)
Creatinine, Ser: 1.11 mg/dL (ref 0.61–1.24)
GFR calc Af Amer: >60 mL/min (ref >60)
GFR calc non Af Amer: >60 mL/min (ref >60)
Glucose, Bld: 134 mg/dL — ABNORMAL HIGH (ref 65–99)
Potassium: 3.2 mmol/L — ABNORMAL LOW (ref 3.5–5.1)
Sodium: 138 mmol/L (ref 135–145)

## 2017-05-13 LAB — GLUCOSE, CAPILLARY
Glucose-Capillary: 119 mg/dL — ABNORMAL HIGH (ref 65–99)
Glucose-Capillary: 122 mg/dL — ABNORMAL HIGH (ref 65–99)
Glucose-Capillary: 145 mg/dL — ABNORMAL HIGH (ref 65–99)
Glucose-Capillary: 165 mg/dL — ABNORMAL HIGH (ref 65–99)
Glucose-Capillary: 112 mg/dL — ABNORMAL HIGH (ref 65–99)

## 2017-05-13 MED ORDER — ENOXAPARIN SODIUM 30 MG/0.3ML ~~LOC~~ SOLN
30.0000 mg | SUBCUTANEOUS | Status: DC
  Administered 2017-05-13: 30 mg via SUBCUTANEOUS
  Filled 2017-05-13: qty 0.3

## 2017-05-13 MED ORDER — MAGNESIUM HYDROXIDE 400 MG/5ML PO SUSP: 30.0000 mL | Freq: Every day | ORAL | Status: DC | PRN

## 2017-05-13 MED ORDER — MOVING RIGHT ALONG BOOK
Freq: Once | Status: AC
  Administered 2017-05-13: 20:00:00
  Filled 2017-05-13: qty 1

## 2017-05-13 MED ORDER — FUROSEMIDE 40 MG PO TABS
40.0000 mg | ORAL_TABLET | Freq: Every day | ORAL | Status: DC
  Administered 2017-05-14: 40 mg via ORAL
  Filled 2017-05-13: qty 1

## 2017-05-13 MED ORDER — ONDANSETRON HCL 4 MG/2ML IJ SOLN: 4.0000 mg | Freq: Four times a day (QID) | INTRAMUSCULAR | Status: DC | PRN

## 2017-05-13 MED ORDER — SORBITOL 70 % SOLN
30.0000 mL | Freq: Once | Status: AC
  Administered 2017-05-13: 30 mL via ORAL
  Filled 2017-05-13: qty 30

## 2017-05-13 MED ORDER — BISACODYL 10 MG RE SUPP: 10.0000 mg | Freq: Every day | RECTAL | Status: DC | PRN

## 2017-05-13 MED ORDER — ASPIRIN EC 81 MG PO TBEC
81.0000 mg | DELAYED_RELEASE_TABLET | Freq: Every day | ORAL | Status: DC
  Administered 2017-05-14 – 2017-05-20 (×7): 81 mg via ORAL
  Filled 2017-05-13 (×7): qty 1

## 2017-05-13 MED ORDER — SODIUM CHLORIDE 0.9% FLUSH: 3.0000 mL | Freq: Two times a day (BID) | INTRAVENOUS | Status: DC

## 2017-05-13 MED ORDER — POTASSIUM CHLORIDE CRYS ER 20 MEQ PO TBCR: 20.0000 meq | EXTENDED_RELEASE_TABLET | Freq: Every day | ORAL | Status: DC

## 2017-05-13 MED ORDER — SODIUM CHLORIDE 0.9 % IV SOLN: 250.0000 mL | INTRAVENOUS | Status: DC | PRN

## 2017-05-13 MED ORDER — PANTOPRAZOLE SODIUM 40 MG PO TBEC: 40.0000 mg | DELAYED_RELEASE_TABLET | Freq: Every day | ORAL | Status: DC

## 2017-05-13 MED ORDER — PANTOPRAZOLE SODIUM 40 MG PO TBEC
40.0000 mg | DELAYED_RELEASE_TABLET | Freq: Every day | ORAL | Status: DC
  Administered 2017-05-14 – 2017-05-20 (×7): 40 mg via ORAL
  Filled 2017-05-13 (×7): qty 1

## 2017-05-13 MED ORDER — SODIUM CHLORIDE 0.9% FLUSH: 3.0000 mL | INTRAVENOUS | Status: DC | PRN

## 2017-05-13 MED ORDER — BISACODYL 5 MG PO TBEC: 10.0000 mg | DELAYED_RELEASE_TABLET | Freq: Every day | ORAL | Status: DC | PRN

## 2017-05-13 MED ORDER — INSULIN ASPART 100 UNIT/ML ~~LOC~~ SOLN
0.0000 [IU] | Freq: Three times a day (TID) | SUBCUTANEOUS | Status: DC
  Administered 2017-05-14: 2 [IU] via SUBCUTANEOUS

## 2017-05-13 MED ORDER — METOPROLOL TARTRATE 12.5 MG HALF TABLET
12.5000 mg | ORAL_TABLET | Freq: Two times a day (BID) | ORAL | Status: DC
  Administered 2017-05-13 – 2017-05-16 (×7): 12.5 mg via ORAL
  Filled 2017-05-13 (×7): qty 1

## 2017-05-13 MED ORDER — POTASSIUM CHLORIDE CRYS ER 20 MEQ PO TBCR
20.0000 meq | EXTENDED_RELEASE_TABLET | Freq: Three times a day (TID) | ORAL | Status: DC
  Administered 2017-05-13 (×2): 20 meq via ORAL
  Filled 2017-05-13 (×2): qty 1

## 2017-05-13 MED ORDER — FUROSEMIDE 10 MG/ML IJ SOLN
40.0000 mg | Freq: Once | INTRAMUSCULAR | Status: AC
  Administered 2017-05-13: 40 mg via INTRAVENOUS
  Filled 2017-05-13: qty 4

## 2017-05-13 MED ORDER — ACETAMINOPHEN 325 MG PO TABS
650.0000 mg | ORAL_TABLET | Freq: Four times a day (QID) | ORAL | Status: DC | PRN
  Administered 2017-05-15 – 2017-05-16 (×3): 650 mg via ORAL
  Filled 2017-05-13 (×3): qty 2

## 2017-05-13 MED ORDER — OXYCODONE HCL 5 MG PO TABS
5.0000 mg | ORAL_TABLET | ORAL | Status: DC | PRN
  Administered 2017-05-15 – 2017-05-16 (×3): 10 mg via ORAL
  Filled 2017-05-13 (×3): qty 2

## 2017-05-13 MED ORDER — INFLUENZA VAC SPLIT HIGH-DOSE 0.5 ML IM SUSY
0.5000 mL | PREFILLED_SYRINGE | INTRAMUSCULAR | Status: AC
  Administered 2017-05-15: 0.5 mL via INTRAMUSCULAR
  Filled 2017-05-13: qty 0.5

## 2017-05-13 MED ORDER — GUAIFENESIN ER 600 MG PO TB12: 600.0000 mg | ORAL_TABLET | Freq: Two times a day (BID) | ORAL | Status: DC | PRN

## 2017-05-13 MED ORDER — TRAMADOL HCL 50 MG PO TABS: 50.0000 mg | ORAL_TABLET | ORAL | Status: DC | PRN

## 2017-05-13 MED ORDER — DOCUSATE SODIUM 100 MG PO CAPS
200.0000 mg | ORAL_CAPSULE | Freq: Every day | ORAL | Status: DC
  Administered 2017-05-14 – 2017-05-20 (×7): 200 mg via ORAL
  Filled 2017-05-13 (×7): qty 2

## 2017-05-13 MED ORDER — ONDANSETRON HCL 4 MG PO TABS: 4.0000 mg | ORAL_TABLET | Freq: Four times a day (QID) | ORAL | Status: DC | PRN

## 2017-05-13 NOTE — Discharge Summary (Signed)
Physician Discharge Summary  Patient ID: Don Medina MRN: [id number] DOB/AGE: 75-May-1942 75 y.o.  Admit date: 05/07/2017 Discharge date: 05/20/2017  Admission Diagnoses:  Patient Active Problem List   Diagnosis Date Noted  . Episode of generalized weakness 02/17/2016  . Weakness 12/09/2015  . HLD (hyperlipidemia) 12/09/2015  . GERD (gastroesophageal reflux disease) 10/03/2012  . Premature ventricular contraction 01/05/2011  . CHEST PAIN 03/19/2010  . OTHER PREMATURE BEATS 03/17/2010  . BRADYCARDIA 03/10/2010  . THROMBOCYTOPENIA 03/05/2010  . ORTHOSTATIC DIZZINESS 03/05/2010  . ADJ DISORDER WITH MIXED ANXIETY & DEPRESSED MOOD 09/08/2009  . MUSCLE STRAIN, HAMSTRING MUSCLE 09/08/2009  . HYPERLIPIDEMIA 01/13/2009  . COLONIC POLYPS, HX OF 01/13/2009   Discharge Diagnoses:   Patient Active Problem List   Diagnosis Date Noted  . S/P aortic dissection repair 05/08/2017  . Episode of generalized weakness 02/17/2016  . Weakness 12/09/2015  . HLD (hyperlipidemia) 12/09/2015  . GERD (gastroesophageal reflux disease) 10/03/2012  . Premature ventricular contraction 01/05/2011  . CHEST PAIN 03/19/2010  . OTHER PREMATURE BEATS 03/17/2010  . BRADYCARDIA 03/10/2010  . THROMBOCYTOPENIA 03/05/2010  . ORTHOSTATIC DIZZINESS 03/05/2010  . ADJ DISORDER WITH MIXED ANXIETY & DEPRESSED MOOD 09/08/2009  . MUSCLE STRAIN, HAMSTRING MUSCLE 09/08/2009  . HYPERLIPIDEMIA 01/13/2009  . 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  This developed while he was playing cards with his family.  He admitted the paint radiated into his back and jaw and he was concerned he was possibly having an MI.  He took an ASA an presented to ED for evaluation.  CXR  was obtained and was read as no abnormality.  He was sent for CT scan which ruled the patient out for PE.  However there was evidence of an aortic dissection.  He was sent for repeat CTA of the chest which showed a Type 1 Aortic Dissection.  Cardiothoracic surgery was consulted and the patient was emergently taken to the Operating room.  Hospital Course:   He underwent Re-suspension of Aortic Valve, Replacement of Ascending Aorta, Arch with debranching of cerebral vessels, frozen elephant trunk with placement of a 31 x 31 x 10 cm CTAG Gore stent graft, placement of a 10 x 39 VBX balloon expandable stent into the left subclavian artery, a 12 x 8-cm graft off the ascending aorta to the innominate and left carotid arteries.  This was performed under cardiopulmonary bypass with hypothermic circulatory arrest and antegrade cerebral perfusion.  He tolerated the procedure without complication and was taken to the SICU in stable condition.  During his stay in the SICU the patient was weaned of Neo-synephrine and Dopamine as tolerated.  He required addition of Epinephrine due to low BP and Cardiac output.  This was also weaned as tolerated.  The patient appeared to be neurologically intact as his sedation was lightened, but initially he would become agitated as sedation was lightened.  He was ultimately weaned and extubated on POD #3.  He was thrombocytopenic and taken off Lovenox and ASA.  He was hypokalemic and supplemented accordingly.  He was volume overloaded and was aggressively diuresed with IV Lasix.  His CXR showed a left pleural effusion.  He required atrial pacing which was weaned as his HR allowed.  He was maintaining NSR under his pacer.  He was ambulating independently.  He was felt stable for transfer to step down unit on 05/13/2017.  The patient continued to progress.  He was started on Lisinopril for better blood pressure control.  He continued to have issues with Hypertension requiring further titration  of his Lisinopril and addition of Norvasc for better BP control.  He was noted to have significant Left Upper Extremity swelling.  Duplex study was ordered and was positive for DVT of the left IJ and subclavian veins.  Due to his thrombocytopenia he required treatment with Bivalirudin.  HIT panel was obtained and Heparin induced platelet antibody was elevated at 1.552.   aPTT was 69 this am on 10/12. He was transitioned to Coumadin therapy.  His most recent PT and INR are 23.8 and 2.14.Per Dr. Servando Snare, he will be discharged home on 2.5 mg of Coumadin.  He developed rapid Atrial Fibrillation.  His beta blocker dose was increased to 50 mg bid and he was started on oral Amiodarone.    He converted to NSR.  His left arm swelling improved.  His pacing wires were removed without difficulty.  He responded well to lasix for volume overload status.  Per Dr. Servando Snare, he is to take Lasix 20 mg daily for one week then stop. He is ambulating independently.  As discussed with Dr. Servando Snare, he is tolerating a heart healthy diet.  He is felt medically stable for discharge home today.                 Consults: cardiology and vascular surgery  Significant Diagnostic Studies: radiology:   CT scan:   1. Complex type A aortic dissection extending from aortic root to aortic bifurcation. 2. The dissection flap approaches the margin of coronary sinuses but does not extend into the coronary arteries. 3. 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 4. Dissection flap extends into proximal right brachiocephalic artery and left subclavian artery. 5. Dissection flap extends into left common carotid artery above the field of view into the neck. 6. Dissection flap extends into proximal common iliac arteries bilaterally. 7. Large abdominal branches arise from both false and true lumens as described above, no dissection is identified extending into the abdominal artery origins. 8.  Ascending aortic aneurysm measuring up to 5.3 cm.  Treatments: surgery:   1. Cardiopulmonary bypass with hypothermic circulatory arrest and     antegrade cerebral perfusion. 2. Resuspension, repair of aortic valve, replacement of ascending     aorta, arch with debranching of the cerebral vessels, frozen     elephant trunk with placement of 31 x 31 x 10 cm CTAG Gore stent     graft, placement of a 10 x 39 VBX balloon expandable stent into the     left subclavian artery, a 12 x 8-cm graft off the ascending aorta     to the innominate and left carotid arteries.  Disposition: 01-Home or Self Care   Discharge Medications:  Discharge Instructions    Amb Referral to Cardiac Rehabilitation    Complete by:  As directed    Diagnosis:  Valve Repair   Valve:  Aortic     Allergies as of 05/20/2017      Reactions   Heparin    Heparin antibody positive; SRA pending      Medication List    STOP taking these medications   meloxicam 7.5 MG tablet Commonly known as:  MOBIC     TAKE these medications   acetaminophen 325  MG tablet Commonly known as:  TYLENOL Take 2 tablets (650 mg total) by mouth every 6 (six) hours as needed for mild pain.   amiodarone 200 MG tablet Commonly known as:  PACERONE Take 1 tablet (200 mg total) by mouth 2 (two) times daily. For one week then take Amiodarone 200 mg by mouth daily thereafter.   amLODipine 10 MG tablet Commonly known as:  NORVASC Take 1 tablet (10 mg total) by mouth daily.   aspirin 81 MG tablet Take 1 tablet (81 mg total) by mouth daily. What changed:  Another medication with the same name was removed. Continue taking this medication, and follow the directions you see here.   citalopram 20 MG tablet Commonly known as:  CELEXA TAKE ONE TABLET BY MOUTH ONCE DAILY What changed:  See the new instructions.   desonide 0.05 % cream Commonly known as:  DESOWEN Apply 1 application topically daily as needed (for facial flares).   Fish Oil  1200 MG Caps Take 1,200 mg by mouth 2 (two) times daily.   folic acid 1 MG tablet Commonly known as:  FOLVITE Take 1 mg by mouth daily.   furosemide 20 MG tablet Commonly known as:  LASIX Take 1 tablet (20 mg total) by mouth daily. For one week then stop.   GLUCOS-CHONDROIT-MSM COMPLEX Tabs Take 1 tablet by mouth 3 (three) times daily.   lisinopril 20 MG tablet Commonly known as:  PRINIVIL,ZESTRIL Take 1 tablet (20 mg total) by mouth 2 (two) times daily.   metoprolol tartrate 50 MG tablet Commonly known as:  LOPRESSOR Take 1 tablet (50 mg total) by mouth 2 (two) times daily.   potassium chloride SA 20 MEQ tablet Commonly known as:  K-DUR,KLOR-CON Take 1 tablet (20 mEq total) by mouth daily. For one week then stop.   simvastatin 20 MG tablet Commonly known as:  ZOCOR TAKE ONE TABLET BY MOUTH AT BEDTIME What changed:  See the new instructions.   traMADol 50 MG tablet Commonly known as:  ULTRAM Take 50 mg by mouth every 4-6 hours PRN severe pain.   warfarin 2.5 MG tablet Commonly known as:  COUMADIN Take 1 tablet (2.5 mg total) by mouth daily. Or as directed.      The patient has been discharged on:   1.Beta Blocker:  Yes [ x  ]                              No   [   ]                              If No, reason:  2.Ace Inhibitor/ARB: Yes [ x  ]                                     No  [    ]                                     If No, reason:  3.Statin:   Yes [ x  ]                  No  [   ]  If No, reason:  4.Ecasa:  Yes  [   ]                  No   [   ]                  If No, reason:  Follow-up Information    [name], MD Follow up on 06/16/2017.   Specialty:  Cardiothoracic Surgery Why:  Appointment is 4:00, please get CTA chest prior to you appointment with Dr. Evaristo Bury information: [place] [id number] [phone]        Isaiah Serge, NP Follow up on 06/06/2017.   Specialties:   Cardiology, Radiology Why:  Appointment is at 11:00 Contact information: [place] [id number] [phone]        [facility] Follow up on 05/23/2017.   Specialty:  Cardiology Why:  Appointment is at 1:45 for PT/INR check Contact information: [address] [place]          Signed: Nani Skillern PA-C 05/20/2017, 11:17 AM

## 2017-05-13 NOTE — Progress Notes (Signed)
Patient ID: Don Medina, male   DOB: 15-Aug-1940, 76 y.o.   MRN: [id number] TCTS DAILY ICU PROGRESS NOTE                   [address]            [place], [address]          [phone]   5 Days Post-Op Procedure(s) (LRB): REPLACEMENT OF ASCENDING AORTIC ARCH (N/A) INTRAOPERATIVE TRANSESOPHAGEAL ECHOCARDIOGRAM (N/A) AXILLARY CANNULATION (Right) HYPOTHERMIC CIRCULATORY ARREST (N/A) REPLACEMENT ASCENDING AORTA (N/A) STENT GRAFTING OF DESCENDING THORACIC AORTA STENT OF LEFT SUBCLAVIAN ARTERY with 10x39VBX DEBRANCHING OF LEFT CAROTID ARTERY AND INNOMINATE ARTERY  Total Length of Stay:  LOS: 5 days   Subjective: Up to chair alert neuro intact, still with edema  Objective: Vital signs in last 24 hours: Temp:  [97.7 F (36.5 C)-98.2 F (36.8 C)] 98.2 F (36.8 C) (10/05 0824) Pulse Rate:  [81-103] 81 (10/05 0700) Cardiac Rhythm: Atrial paced (10/05 0800) Resp:  [0-28] 20 (10/05 0700) BP: (130-178)/(79-95) 138/85 (10/05 0700) SpO2:  [93 %-100 %] 95 % (10/05 0700) Weight:  [191 lb 12.8 oz (87 kg)] 191 lb 12.8 oz (87 kg) (10/05 0500)  Filed Weights   05/11/17 0341 05/12/17 0500 05/13/17 0500  Weight: 194 lb 7.1 oz (88.2 kg) 192 lb 7.4 oz (87.3 kg) 191 lb 12.8 oz (87 kg)    Weight change: -10.6 oz (-0.3 kg)   Hemodynamic parameters for last 24 hours:    Intake/Output from previous day: 10/04 0701 - 10/05 0700 In: 961 [P.O.:730; I.V.:181; IV Piggyback:50] Out: 1925 [KGMWN:0272]  Intake/Output this shift: No intake/output data recorded.  Current Meds: Scheduled Meds: . acetaminophen  1,000 mg Oral Q6H   Or  . acetaminophen (TYLENOL) oral liquid 160 mg/5 mL  1,000 mg Per Tube Q6H  . bisacodyl  10 mg Oral Daily   Or  . bisacodyl  10 mg Rectal Daily  . Chlorhexidine Gluconate Cloth  6 each Topical Daily  . citalopram  20 mg Oral Daily  . docusate  200 mg Oral Daily  . insulin aspart  0-24 Units Subcutaneous Q4H  . mouth rinse  15 mL Mouth Rinse  BID  . metoprolol tartrate  12.5 mg Oral BID   Or  . metoprolol tartrate  12.5 mg Per Tube BID  . metoprolol tartrate  12.5 mg Oral Once  . [START ON 05/14/2017] pantoprazole  40 mg Oral Daily  . simvastatin  20 mg Oral QHS  . sodium chloride flush  10-40 mL Intracatheter Q12H  . sodium chloride flush  3 mL Intravenous Q12H   Continuous Infusions: . sodium chloride Stopped (05/11/17 1500)  . sodium chloride    . sodium chloride    . sodium chloride Stopped (05/12/17 1801)  . dexmedetomidine (PRECEDEX) IV infusion Stopped (05/11/17 0953)  . insulin (NOVOLIN-R) infusion Stopped (05/11/17 1312)  . lactated ringers Stopped (05/09/17 2231)  . lactated ringers Stopped (05/09/17 2231)  . lactated ringers Stopped (05/09/17 2230)  . nitroGLYCERIN Stopped (05/11/17 1207)  . phenylephrine (NEO-SYNEPHRINE) Adult infusion Stopped (05/08/17 2000)   PRN Meds:.sodium chloride, lactated ringers, metoprolol tartrate, midazolam, morphine injection, ondansetron (ZOFRAN) IV, oxyCODONE, sodium chloride flush, sodium chloride flush, traMADol  General appearance: alert and cooperative Neurologic: intact Heart: regular rate and rhythm, S1, S2 normal, no murmur, click, rub or gallop Lungs: diminished breath sounds bibasilar Abdomen: soft, non-tender; bowel sounds normal; no masses,  no organomegaly Extremities: edema hands and feet and  Homans sign is negative, no sign of DVT Wound: intact, small amt drainage from chest tube site   Lab Results: CBC: Recent Labs  05/12/17 0452 05/13/17 0512  WBC 12.2* 13.4*  HGB 8.5* 9.8*  HCT 24.7* 28.4*  PLT 79* 112*   BMET:  Recent Labs  05/12/17 0452 05/13/17 0512  NA 140 138  K 3.1* 3.2*  CL 109 109  CO2 22 26  GLUCOSE 143* 134*  BUN 21* 20  CREATININE 1.10 1.11  CALCIUM 7.5* 7.9*    CMET: Lab Results  Component Value Date   WBC 13.4 (H) 05/13/2017   HGB 9.8 (L) 05/13/2017   HCT 28.4 (L) 05/13/2017   PLT 112 (L) 05/13/2017   GLUCOSE 134 (H)  05/13/2017   CHOL 128 12/09/2015   TRIG 58 12/09/2015   HDL 60 12/09/2015   LDLCALC 56 12/09/2015   ALT 116 (H) 05/10/2017   AST 128 (H) 05/10/2017   NA 138 05/13/2017   K 3.2 (L) 05/13/2017   CL 109 05/13/2017   CREATININE 1.11 05/13/2017   BUN 20 05/13/2017   CO2 26 05/13/2017   TSH 2.00 08/23/2013   PSA 1.40 08/23/2013   INR 1.05 05/08/2017   HGBA1C 6.1 (H) 12/09/2015      PT/INR: No results for input(s): LABPROT, INR in the last 72 hours. Radiology: No results found.   Assessment/Plan: S/P Procedure(s) (LRB): REPLACEMENT OF ASCENDING AORTIC ARCH (N/A) INTRAOPERATIVE TRANSESOPHAGEAL ECHOCARDIOGRAM (N/A) AXILLARY CANNULATION (Right) HYPOTHERMIC CIRCULATORY ARREST (N/A) REPLACEMENT ASCENDING AORTA (N/A) STENT GRAFTING OF DESCENDING THORACIC AORTA STENT OF LEFT SUBCLAVIAN ARTERY with 10x39VBX DEBRANCHING OF LEFT CAROTID ARTERY AND INNOMINATE ARTERY Mobilize Diuresis Plan for transfer to step-down: see transfer orders replace kcl     Don Medina 05/13/2017 10:17 AM

## 2017-05-13 NOTE — Progress Notes (Signed)
New Admission Note:   Arrival Method: From 2H via wheelchair Mental Orientation: A&Ox4 Assessment: Completed Skin: Surgical Incision IV: R double lumen PICC Pain: denies pain at this time Tubes: external packer Safety Measures: Safety Fall Prevention Plan has been discussed  Admission 4 East Orientation: Patient has been orientated to the room, unit and staff.  Family: none present at bedside  Orders to be reviewed and implemented. Will continue to monitor the patient. Call light has been placed within reach and bed alarm has been activated. Pt placed on monitor. CCMD notified    Isac Caddy, RN

## 2017-05-13 NOTE — Progress Notes (Signed)
Slight drainage noted on patients midsternum incision . Incision cloroprep and guaze dressing put in place.Patient ambulated 370 ft  tolerated very well , appears stable throughout ambulation process.

## 2017-05-14 ENCOUNTER — Inpatient Hospital Stay (HOSPITAL_COMMUNITY): Payer: Medicare HMO

## 2017-05-14 DIAGNOSIS — I82622 Acute embolism and thrombosis of deep veins of left upper extremity: Secondary | ICD-10-CM

## 2017-05-14 LAB — BASIC METABOLIC PANEL
Anion gap: 9 (ref 5–15)
BUN: 17 mg/dL (ref 6–20)
CO2: 25 mmol/L (ref 22–32)
Calcium: 7.9 mg/dL — ABNORMAL LOW (ref 8.9–10.3)
Chloride: 105 mmol/L (ref 101–111)
Creatinine, Ser: 1.03 mg/dL (ref 0.61–1.24)
GFR calc Af Amer: >60 mL/min (ref >60)
GFR calc non Af Amer: >60 mL/min (ref >60)
Glucose, Bld: 125 mg/dL — ABNORMAL HIGH (ref 65–99)
Potassium: 3 mmol/L — ABNORMAL LOW (ref 3.5–5.1)
Sodium: 139 mmol/L (ref 135–145)

## 2017-05-14 LAB — GLUCOSE, CAPILLARY: Glucose-Capillary: 128 mg/dL — ABNORMAL HIGH (ref 65–99)

## 2017-05-14 LAB — CBC
HCT: 28.6 % — ABNORMAL LOW (ref 39.0–52.0)
Hemoglobin: 9.6 g/dL — ABNORMAL LOW (ref 13.0–17.0)
MCH: 30.4 pg (ref 26.0–34.0)
MCHC: 33.6 g/dL (ref 30.0–36.0)
MCV: 90.5 fL (ref 78.0–100.0)
Platelets: 140 10*3/uL — ABNORMAL LOW (ref 150–400)
RBC: 3.16 MIL/uL — ABNORMAL LOW (ref 4.22–5.81)
RDW: 14.9 % (ref 11.5–15.5)
WBC: 10.4 10*3/uL (ref 4.0–10.5)

## 2017-05-14 LAB — APTT: aPTT: 35 seconds (ref 24–36)

## 2017-05-14 LAB — PROTIME-INR
INR: 1.13
Prothrombin Time: 14.4 seconds (ref 11.4–15.2)

## 2017-05-14 MED ORDER — LISINOPRIL 5 MG PO TABS
5.0000 mg | ORAL_TABLET | Freq: Every day | ORAL | Status: DC
  Administered 2017-05-14: 5 mg via ORAL
  Filled 2017-05-14: qty 1

## 2017-05-14 MED ORDER — POTASSIUM CHLORIDE CRYS ER 20 MEQ PO TBCR
40.0000 meq | EXTENDED_RELEASE_TABLET | Freq: Once | ORAL | Status: AC
  Administered 2017-05-14: 40 meq via ORAL
  Filled 2017-05-14: qty 2

## 2017-05-14 MED ORDER — SODIUM CHLORIDE 0.9 % IV SOLN
0.0900 mg/kg/h | INTRAVENOUS | Status: DC
  Administered 2017-05-14: 0.05 mg/kg/h via INTRAVENOUS
  Filled 2017-05-14: qty 250

## 2017-05-14 MED ORDER — POTASSIUM CHLORIDE CRYS ER 20 MEQ PO TBCR
40.0000 meq | EXTENDED_RELEASE_TABLET | Freq: Every day | ORAL | Status: DC
  Administered 2017-05-15 – 2017-05-17 (×3): 40 meq via ORAL
  Filled 2017-05-14 (×3): qty 2

## 2017-05-14 MED ORDER — POTASSIUM CHLORIDE CRYS ER 20 MEQ PO TBCR: 40.0000 meq | EXTENDED_RELEASE_TABLET | Freq: Every day | ORAL | Status: DC

## 2017-05-14 MED FILL — Heparin Sodium (Porcine) Inj 1000 Unit/ML: INTRAMUSCULAR | Qty: 30 | Status: AC

## 2017-05-14 MED FILL — Potassium Chloride Inj 2 mEq/ML: INTRAVENOUS | Qty: 40 | Status: AC

## 2017-05-14 MED FILL — Magnesium Sulfate Inj 50%: INTRAMUSCULAR | Qty: 10 | Status: AC

## 2017-05-14 NOTE — Progress Notes (Signed)
CARDIAC REHAB PHASE I   PRE:  Rate/Rhythm: 91 sr  BP:  Sitting: 155/88      SaO2: 94 ra  MODE:  Ambulation: 500 ft   POST:  Rate/Rhythm: 102 st  BP:  Sitting: 161/94     SaO2: 96 ra 1100-1140 Assisted patient to bathroom for BM. Patient ambulated in hallway x 1 assist with RW. Steady gait noted. Patient denied complaints. Observed patient using heart pillow for standing and sitting. Post ambulation patient to chair with call bell and phone in reach. Family at bedside. Discussed phase 2 cardiac rehab. Patient interested. Concerned with left arm swelling. Encouraged patient to keep elevated as much as possible. Patient stated CVTS PA observed arm and stated diuretics should help improve edema. Will continue to follow on Monday. Encouraged to ambulate 2 more times today.  Sehaj Mcenroe English PayneRN, BSN 05/14/2017 11:44 AM

## 2017-05-14 NOTE — Progress Notes (Signed)
Subjective:  Denies SSCP, palpitations or Dyspnea Had BM  Objective:  Vitals:   05/14/17 0358 05/14/17 0359 05/14/17 1005 05/14/17 1006  BP:  (!) 156/91 (!) 158/97   Pulse:  83  97  Resp: (!) 21 (!) 21    Temp:  98.9 F (37.2 C)    TempSrc:  Oral    SpO2:  97%    Weight: 190 lb 4.8 oz (86.3 kg)     Height:        Intake/Output from previous day:  Intake/Output Summary (Last 24 hours) at 05/14/17 1033 Last data filed at 05/14/17 0900  Gross per 24 hour  Intake              500 ml  Output             1715 ml  Net            -1215 ml    Physical Exam: Affect appropriate Healthy:  appears stated age HEENT: normal Neck supple with no adenopathy JVP normal no bruits no thyromegaly Lungs clear with no wheezing and good diaphragmatic motion Heart:  S1/S2 rub no AR post sternotomy PMI normal Abdomen: benighn, BS positve, no tenderness, no AAA no bruit.  No HSM or HJR Distal pulses intact with no bruits Edema in hands and feet improved  Neuro non-focal Skin warm and dry No muscular weakness   Lab Results: Basic Metabolic Panel:  Recent Labs  05/13/17 0512 05/14/17 0408  NA 138 139  K 3.2* 3.0*  CL 109 105  CO2 26 25  GLUCOSE 134* 125*  BUN 20 17  CREATININE 1.11 1.03  CALCIUM 7.9* 7.9*   Liver Function Tests: No results for input(s): AST, ALT, ALKPHOS, BILITOT, PROT, ALBUMIN in the last 72 hours. No results for input(s): LIPASE, AMYLASE in the last 72 hours. CBC:  Recent Labs  05/13/17 0512 05/14/17 0408  WBC 13.4* 10.4  HGB 9.8* 9.6*  HCT 28.4* 28.6*  MCV 90.2 90.5  PLT 112* 140*   Cardiac Enzymes: No results for input(s): CKTOTAL, CKMB, CKMBINDEX, TROPONINI in the last 72 hours. BNP: Invalid input(s): POCBNP D-Dimer: No results for input(s): DDIMER in the last 72 hours. Hemoglobin A1C: No results for input(s): HGBA1C in the last 72 hours. Fasting Lipid Panel: No results for input(s): CHOL, HDL, LDLCALC, TRIG, CHOLHDL, LDLDIRECT in  the last 72 hours. Thyroid Function Tests: No results for input(s): TSH, T4TOTAL, T3FREE, THYROIDAB in the last 72 hours.  Invalid input(s): FREET3 Anemia Panel: No results for input(s): VITAMINB12, FOLATE, FERRITIN, TIBC, IRON, RETICCTPCT in the last 72 hours.  Imaging: Dg Chest 2 View  Result Date: 05/14/2017 CLINICAL DATA:  Post op aortic arch replacement EXAM: CHEST  2 VIEW COMPARISON:  Chest x-ray dated 05/12/2017. FINDINGS: Ascending aortic stent graft appears stable in position. Right-sided PICC line appears adequately positioned with tip at the level of the lower SVC/ cavoatrial junction. Cardiomediastinal silhouette appears stable in size and configuration. Persistent opacity at the left lung base, compatible with small pleural effusion and probable associated atelectasis. Right lung remains clear. No pneumothorax seen. Chronic compression fracture deformity at the thoracolumbar junction. No acute or suspicious osseous finding. IMPRESSION: 1. Stable small left pleural effusion with probable associated atelectasis. 2. No new lung findings.  No pneumothorax. Electronically Signed   By: Franki Cabot M.D.   On: 05/14/2017 08:17    Cardiac Studies:  ECG: SR nonspecific ST changes    Telemetry:  NSR no longer  A pacing   Echo:  05/08/17 TEE EF 55-60%  Medications:   . aspirin EC  81 mg Oral Daily  . Chlorhexidine Gluconate Cloth  6 each Topical Daily  . citalopram  20 mg Oral Daily  . docusate sodium  200 mg Oral Daily  . enoxaparin (LOVENOX) injection  30 mg Subcutaneous Q24H  . furosemide  40 mg Oral Daily  . Influenza vac split quadrivalent PF  0.5 mL Intramuscular Tomorrow-1000  . lisinopril  5 mg Oral Daily  . mouth rinse  15 mL Mouth Rinse BID  . metoprolol tartrate  12.5 mg Oral BID  . pantoprazole  40 mg Oral QAC breakfast  . [START ON 05/15/2017] potassium chloride  40 mEq Oral Daily  . simvastatin  20 mg Oral QHS  . sodium chloride flush  10-40 mL Intracatheter Q12H  .  sodium chloride flush  3 mL Intravenous Q12H  . sodium chloride flush  3 mL Intravenous Q12H     . sodium chloride Stopped (05/11/17 1500)  . sodium chloride    . sodium chloride Stopped (05/12/17 1801)  . sodium chloride    . lactated ringers Stopped (05/09/17 2231)  . lactated ringers Stopped (05/09/17 2231)    Assessment/Plan:  Type A dissection: remarkable recovery thanks to great surgical care with elephant trunk reconstruction , arch vessel debranching and distal stent placement. Rhythm is stable now back up pacing only SR no AV block Mobilizing fluid with daily iv lasix BP improved now on low dose ACE and beta blocker. Said hello to family and wife  Jenkins Rouge 05/14/2017, 10:33 AM

## 2017-05-14 NOTE — Progress Notes (Addendum)
[address]       [place] [address]             [phone]        6 Days Post-Op Procedure(s) (LRB): REPLACEMENT OF ASCENDING AORTIC ARCH (N/A) INTRAOPERATIVE TRANSESOPHAGEAL ECHOCARDIOGRAM (N/A) AXILLARY CANNULATION (Right) HYPOTHERMIC CIRCULATORY ARREST (N/A) REPLACEMENT ASCENDING AORTA (N/A) STENT GRAFTING OF DESCENDING THORACIC AORTA STENT OF LEFT SUBCLAVIAN ARTERY with 10x39VBX DEBRANCHING OF LEFT CAROTID ARTERY AND INNOMINATE ARTERY  Subjective: Patient without complaints this am. He did have a large bowel movement last night.  Objective: Vital signs in last 24 hours: Temp:  [97.9 F (36.6 C)-99.5 F (37.5 C)] 98.9 F (37.2 C) (10/06 0359) Pulse Rate:  [83-120] 83 (10/06 0359) Cardiac Rhythm: Atrial paced (10/05 2207) Resp:  [18-28] 21 (10/06 0359) BP: (141-167)/(73-127) 156/91 (10/06 0359) SpO2:  [93 %-100 %] 97 % (10/06 0359) Weight:  [190 lb 4.8 oz (86.3 kg)] 190 lb 4.8 oz (86.3 kg) (10/06 0358)  Pre op weight 79.8 kg Current Weight  05/14/17 190 lb 4.8 oz (86.3 kg)      Intake/Output from previous day: 10/05 0701 - 10/06 0700 In: 380 [P.O.:360; I.V.:20] Out: 2165 [Urine:2165]   Physical Exam:  Cardiovascular: RRR, no murmur Pulmonary: Clear to auscultation on the right and slightly diminished left base Abdomen: Soft, non tender, bowel sounds present. Extremities: Mild bilateral lower extremity edema. Wounds: Sternal dressing inferiorly removed. He had old blood on dressing but no active drainage this am. I applied clean dressings.  No erythema or signs of infection.   Lab Results: CBC: Recent Labs  05/13/17 0512 05/14/17 0408  WBC 13.4* 10.4  HGB 9.8* 9.6*  HCT 28.4* 28.6*  PLT 112* 140*   BMET:  Recent Labs  05/13/17 0512 05/14/17 0408  NA 138 139  K 3.2* 3.0*  CL 109 105  CO2 26 25  GLUCOSE 134* 125*  BUN 20 17  CREATININE 1.11 1.03  CALCIUM 7.9* 7.9*    PT/INR:  Lab Results  Component Value Date     INR 1.13 05/14/2017   INR 1.05 05/08/2017   INR 1.04 05/08/2017   ABG:  INR: Will add last result for INR, ABG once components are confirmed Will add last 4 CBG results once components are confirmed  Assessment/Plan:  1. CV - On back up pacer set at 60. His HR has been in the 80's or hight. I disconnected the pacer. On Lopressor 12.5 mg bid. Will start Lisinopril for better BP control. 2.  Pulmonary - On room air. CXR this am appears stable (no pneumothorax, left base atelectasis and small pleural effusion). Encourage incentive spirometer. 3. Volume Overload - On Lasix 40 mg daily 4.  Acute blood loss anemia - H and H stable at 9.6 and 28.6 5. Mild thrombocytopenia-platelets up to 140,000 6. Supplement potassium 7. CBGs. He did not have an HGA1C drawn on this admission as was emergent surgery. Last HGA1C in 2017 was 6.1. Will stop accu checks and SS. He will need follow up with medical doctor after discharge and will provide pre diabetes diet recommendations. 8. Will likely remove EPW in am 9. Likely home in 1-2 days  ZIMMERMAN,DONIELLE MPA-C 05/14/2017,7:15 AM patient examined and medical record reviewed,agree with above note. Tharon Aquas Trigt III 05/14/2017  L arm with sig swelling Pt with hx low plts postop\now on lovenox Received factor VII Check HIT , [place] pending May need short term bival if DVT + Stop lovenox  P Prescott Gum

## 2017-05-14 NOTE — Progress Notes (Signed)
CT Surgery  [place] + for DVT Left IJ, subclavian veins Cannot use heparin due to postop thrombocytopenia Start bivalirudin as ordered and check PTT daily Transition to coumadin in 48 hrs No BP in Left arm

## 2017-05-14 NOTE — Progress Notes (Addendum)
VASCULAR LAB PRELIMINARY  PRELIMINARY  PRELIMINARY  PRELIMINARY  Left upper extremity venous duplex completed.    Preliminary report:  There is non occlusive DVT noted in the left Internal jugular vein. There is non occlusive superficial thrombosis noted in the left cephalic vein from the AV to the shoulder.  Attempted to call results to Juliann Pulse, RN 17:30  Sharion Dove, RVT 05/14/2017, 5:16 PM

## 2017-05-15 DIAGNOSIS — I10 Essential (primary) hypertension: Secondary | ICD-10-CM

## 2017-05-15 DIAGNOSIS — I82621 Acute embolism and thrombosis of deep veins of right upper extremity: Secondary | ICD-10-CM

## 2017-05-15 LAB — BASIC METABOLIC PANEL
Anion gap: 8 (ref 5–15)
BUN: 16 mg/dL (ref 6–20)
CO2: 27 mmol/L (ref 22–32)
Calcium: 7.9 mg/dL — ABNORMAL LOW (ref 8.9–10.3)
Chloride: 104 mmol/L (ref 101–111)
Creatinine, Ser: 1 mg/dL (ref 0.61–1.24)
GFR calc Af Amer: >60 mL/min (ref >60)
GFR calc non Af Amer: >60 mL/min (ref >60)
Glucose, Bld: 127 mg/dL — ABNORMAL HIGH (ref 65–99)
Potassium: 3.5 mmol/L (ref 3.5–5.1)
Sodium: 139 mmol/L (ref 135–145)

## 2017-05-15 LAB — APTT
aPTT: 35 seconds (ref 24–36)
aPTT: 35 seconds (ref 24–36)
aPTT: 36 seconds (ref 24–36)

## 2017-05-15 LAB — CBC
HCT: 27.4 % — ABNORMAL LOW (ref 39.0–52.0)
Hemoglobin: 9.1 g/dL — ABNORMAL LOW (ref 13.0–17.0)
MCH: 30.3 pg (ref 26.0–34.0)
MCHC: 33.2 g/dL (ref 30.0–36.0)
MCV: 91.3 fL (ref 78.0–100.0)
Platelets: 160 10*3/uL (ref 150–400)
RBC: 3 MIL/uL — ABNORMAL LOW (ref 4.22–5.81)
RDW: 14.9 % (ref 11.5–15.5)
WBC: 9.3 10*3/uL (ref 4.0–10.5)

## 2017-05-15 MED ORDER — AMLODIPINE BESYLATE 5 MG PO TABS
5.0000 mg | ORAL_TABLET | Freq: Every day | ORAL | Status: DC
  Administered 2017-05-15: 5 mg via ORAL
  Filled 2017-05-15: qty 1

## 2017-05-15 MED ORDER — WARFARIN SODIUM 2.5 MG PO TABS
2.5000 mg | ORAL_TABLET | Freq: Once | ORAL | Status: AC
Start: 2017-05-15 — End: 2017-05-15
  Administered 2017-05-15: 2.5 mg via ORAL
  Filled 2017-05-15: qty 1

## 2017-05-15 MED ORDER — WARFARIN - PHARMACIST DOSING INPATIENT
Freq: Every day | Status: DC
  Administered 2017-05-16 – 2017-05-18 (×2)

## 2017-05-15 MED ORDER — LISINOPRIL 10 MG PO TABS
20.0000 mg | ORAL_TABLET | Freq: Two times a day (BID) | ORAL | Status: DC
  Administered 2017-05-15 – 2017-05-20 (×11): 20 mg via ORAL
  Filled 2017-05-15 (×13): qty 2

## 2017-05-15 MED ORDER — FUROSEMIDE 10 MG/ML IJ SOLN
40.0000 mg | Freq: Once | INTRAMUSCULAR | Status: AC
  Administered 2017-05-15: 40 mg via INTRAVENOUS
  Filled 2017-05-15: qty 4

## 2017-05-15 MED ORDER — POTASSIUM CHLORIDE CRYS ER 20 MEQ PO TBCR
40.0000 meq | EXTENDED_RELEASE_TABLET | Freq: Once | ORAL | Status: AC
  Administered 2017-05-15: 40 meq via ORAL
  Filled 2017-05-15: qty 2

## 2017-05-15 MED ORDER — SODIUM CHLORIDE 0.9 % IV SOLN
0.1600 mg/kg/h | INTRAVENOUS | Status: DC
  Administered 2017-05-16: 0.13 mg/kg/h via INTRAVENOUS
  Administered 2017-05-17 – 2017-05-19 (×3): 0.15 mg/kg/h via INTRAVENOUS
  Filled 2017-05-15 (×5): qty 250

## 2017-05-15 NOTE — Progress Notes (Signed)
[place] for bivalirudin Indication: DVT with possible HIT  No Known Allergies  Patient Measurements: Height: 5\' 8"  (172.7 cm) Weight: 191 lb 3.2 oz (86.7 kg) IBW/kg (Calculated) : 68.4   Vital Signs: Temp: 98.4 F (36.9 C) (10/07 1938) Temp Source: Oral (10/07 1938) BP: 164/93 (10/07 1938) Pulse Rate: 91 (10/07 1938)  Labs:  Recent Labs  05/13/17 0512  05/14/17 0408 05/15/17 0340 05/15/17 1510 05/15/17 1900  HGB 9.8*  --  9.6* 9.1*  --   --   HCT 28.4*  --  28.6* 27.4*  --   --   PLT 112*  --  140* 160  --   --   APTT  --   < > 35 35 35 36  LABPROT  --   --  14.4  --   --   --   INR  --   --  1.13  --   --   --   CREATININE 1.11  --  1.03 1.00  --   --   < > = values in this interval not displayed.  Estimated Creatinine Clearance: 68.3 mL/min (by C-G formula based on SCr of 1 mg/dL).   Medical History: Past Medical History:  Diagnosis Date  . Arthritis   . Blood in the stool   . Chicken pox   . Hay fever   . History of colonic polyps   . History of colonoscopy   . Hyperlipidemia     Medications:  Scheduled:  . amLODipine  5 mg Oral Daily  . aspirin EC  81 mg Oral Daily  . Chlorhexidine Gluconate Cloth  6 each Topical Daily  . citalopram  20 mg Oral Daily  . docusate sodium  200 mg Oral Daily  . lisinopril  20 mg Oral BID  . mouth rinse  15 mL Mouth Rinse BID  . metoprolol tartrate  12.5 mg Oral BID  . pantoprazole  40 mg Oral QAC breakfast  . potassium chloride  40 mEq Oral Daily  . simvastatin  20 mg Oral QHS  . sodium chloride flush  10-40 mL Intracatheter Q12H  . sodium chloride flush  3 mL Intravenous Q12H  . sodium chloride flush  3 mL Intravenous Q12H  . Warfarin - Pharmacist Dosing Inpatient   Does not apply q1800    Assessment: 76 yo male with aortic dissection (type 1) s/p stent grafting (9/30). He is noted with DVT  in the left Internal jugular vein and bivalirudin started by Dr. Nils Pyle on  10/7 (thrombocytopenia noted s/p surgery and concern for possible HIT). Pharmacy consulted to dose warfarin and bivalirudin -aPTT= 36 (after bivalirudin increased to 0.09 mg/kg/hr)  Goal of Therapy:  aPTT 60-80 seconds Monitor platelets by anticoagulation protocol: Yes   Plan:  -Check an aPTT now -HIT antibody pending  -Coumadin 2.5mg  po today -Daily PT/INR  Hildred Laser, Pharm D 05/15/2017 8:59 PM  Addendum -aPTT= 35 on 0.07 mg/kg/hr  Plan  -Increase to 0.12 mg/kg/hr -aPTT in 2 hours  Hildred Laser, Pharm D 05/15/2017 8:59 PM

## 2017-05-15 NOTE — Progress Notes (Addendum)
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   Medical History: Past Medical History:  Diagnosis Date  . Arthritis   . Blood in the stool   . Chicken pox   . Hay fever   . History of colonic polyps   . History of colonoscopy   . Hyperlipidemia     Medications:  Scheduled:  . amLODipine  5 mg Oral Daily  . aspirin EC  81 mg Oral Daily  . Chlorhexidine Gluconate Cloth  6 each Topical Daily  . citalopram  20 mg Oral Daily  . docusate sodium  200 mg Oral Daily  . lisinopril  20 mg Oral BID  . mouth rinse  15 mL Mouth Rinse BID  . metoprolol tartrate  12.5 mg Oral BID  . pantoprazole  40 mg Oral QAC breakfast  . potassium chloride  40 mEq Oral Daily  . simvastatin  20 mg Oral QHS  . sodium chloride flush  10-40 mL Intracatheter Q12H  . sodium chloride flush  3 mL Intravenous Q12H  . sodium chloride flush  3 mL Intravenous Q12H    Assessment: 76 yo male with aortic dissection (type 1) s/p stent grafting (9/30). He is noted with DVT  in the left Internal jugular vein and bivalirudin started by Dr. Nils Pyle on 10/7 (thrombocytopenia noted s/p surgery and concern for possible HIT). Pharmacy consulted to dose warfarin and bivalirudin -aPTT= 35 (bivalirudin increased to 0.07 mg/kg/hr at about 10am) -plt up to 160 (noted 64 on  10/3)  Goal of Therapy:  aPTT 60-80 seconds Monitor platelets by anticoagulation protocol: Yes   Plan:  -Check an aPTT now -HIT antibody pending  -Coumadin 2.5mg  po today -Daily PT/INR  Hildred Laser, Pharm D 05/15/2017 12:36 PM  Addendum -aPTT= 35 on 0.07 mg/kg/hr  Plan -Increase to 0.09 mg/kg/hr -aPTT in 2 hours  Hildred Laser, Pharm D 05/15/2017 4:30 PM

## 2017-05-15 NOTE — Progress Notes (Addendum)
      [address]       [place] [address]             [phone]        7 Days Post-Op Procedure(s) (LRB): REPLACEMENT OF ASCENDING AORTIC ARCH (N/A) INTRAOPERATIVE TRANSESOPHAGEAL ECHOCARDIOGRAM (N/A) AXILLARY CANNULATION (Right) HYPOTHERMIC CIRCULATORY ARREST (N/A) REPLACEMENT ASCENDING AORTA (N/A) STENT GRAFTING OF DESCENDING THORACIC AORTA STENT OF LEFT SUBCLAVIAN ARTERY with 10x39VBX DEBRANCHING OF LEFT CAROTID ARTERY AND INNOMINATE ARTERY  Subjective: Patient had left hand and arm pain   Objective: Vital signs in last 24 hours: Temp:  [98.3 F (36.8 C)-99.3 F (37.4 C)] 98.3 F (36.8 C) (10/07 0403) Pulse Rate:  [80-108] 108 (10/07 0405) Cardiac Rhythm: Normal sinus rhythm (10/06 2358) Resp:  [17-25] 18 (10/07 0405) BP: (136-170)/(79-97) 159/82 (10/07 0405) SpO2:  [77 %-100 %] 77 % (10/07 0405) Weight:  [191 lb 3.2 oz (86.7 kg)] 191 lb 3.2 oz (86.7 kg) (10/07 0403)  Pre op weight 79.8 kg Current Weight  05/15/17 191 lb 3.2 oz (86.7 kg)      Intake/Output from previous day: 10/06 0701 - 10/07 0700 In: 753 [P.O.:600; I.V.:153] Out: 425 [Urine:425]   Physical Exam:  Cardiovascular: RRR, no murmur Pulmonary: Clear to auscultation on the right and slightly diminished left base Abdomen: Soft, non tender, bowel sounds present. Extremities: Mild bilateral lower extremity edema. LUE edema. Left hand is swollen but adial and ulnar pulses intact.  Wounds: . He has sero sanguinous drainage from inferior sternal wound.  No erythema. Dressing applied.  Lab Results: CBC:  Recent Labs  05/14/17 0408 05/15/17 0340  WBC 10.4 9.3  HGB 9.6* 9.1*  HCT 28.6* 27.4*  PLT 140* 160   BMET:   Recent Labs  05/14/17 0408 05/15/17 0340  NA 139 139  K 3.0* 3.5  CL 105 104  CO2 25 27  GLUCOSE 125* 127*  BUN 17 16  CREATININE 1.03 1.00  CALCIUM 7.9* 7.9*    PT/INR:  Lab Results  Component Value Date   INR 1.13 05/14/2017   INR 1.05  05/08/2017   INR 1.04 05/08/2017   ABG:  INR: Will add last result for INR, ABG once components are confirmed Will add last 4 CBG results once components are confirmed  Assessment/Plan:  1. CV - Hypertensive, SR in the 70's. On Lopressor 12.5 mg bid and Lisinopril 5 mg daily. Will increase Lisinopril to 20 mg bid for better BP control and add Norvasc for better BP control. 2.  Pulmonary - On room air.  Encourage incentive spirometer. 3. Volume Overload - On Lasix 40 mg daily but will give IV this am as is not diuresing well 4.  Acute blood loss anemia - H and H stable at 9.1 and 27.4 5. Mild thrombocytopenia resolved -platelets up to 160,000 6. LUE DVT- On Bivalirudin. APTT this am 35. Will transition to Coumadin likely in am. 7. CBGs. He did not have an HGA1C drawn on this admission as was emergent surgery. Last HGA1C in 2017 was 6.1. Will stop accu checks and SS. He will need follow up with medical doctor after discharge and will provide pre diabetes diet recommendations. 8. Will remove EPW after off Bivalirudin 9. Supplement potassium   ZIMMERMAN,DONIELLE MPA-C 05/15/2017,7:03 AM  L arm swelling better Cont bival pending HIT- dosing d/w pharmD Start coumadin with plt count 140k patient examined and medical record reviewed,agree with above note. Tharon Aquas Trigt III 05/15/2017

## 2017-05-15 NOTE — Progress Notes (Signed)
Pt's BP noted to be 170/97 in right forearm. PRN med given as ordered and RN will continue monitor.

## 2017-05-15 NOTE — Progress Notes (Signed)
[place] for bivalirudin Indication: DVT with possible HIT  No Known Allergies  Patient Measurements: Height: 5\' 8"  (172.7 cm) Weight: 191 lb 3.2 oz (86.7 kg) IBW/kg (Calculated) : 68.4   Vital Signs: Temp: 98.4 F (36.9 C) (10/07 1938) Temp Source: Oral (10/07 1938) BP: 164/93 (10/07 1938) Pulse Rate: 91 (10/07 1938)  Labs:  Recent Labs  05/13/17 0512  05/14/17 0408 05/15/17 0340 05/15/17 1510 05/15/17 1900  HGB 9.8*  --  9.6* 9.1*  --   --   HCT 28.4*  --  28.6* 27.4*  --   --   PLT 112*  --  140* 160  --   --   APTT  --   < > 35 35 35 36  LABPROT  --   --  14.4  --   --   --   INR  --   --  1.13  --   --   --   CREATININE 1.11  --  1.03 1.00  --   --   < > = values in this interval not displayed.  Estimated Creatinine Clearance: 68.3 mL/min (by C-G formula based on SCr of 1 mg/dL).   Medical History: Past Medical History:  Diagnosis Date  . Arthritis   . Blood in the stool   . Chicken pox   . Hay fever   . History of colonic polyps   . History of colonoscopy   . Hyperlipidemia     Medications:  Scheduled:  . amLODipine  5 mg Oral Daily  . aspirin EC  81 mg Oral Daily  . Chlorhexidine Gluconate Cloth  6 each Topical Daily  . citalopram  20 mg Oral Daily  . docusate sodium  200 mg Oral Daily  . lisinopril  20 mg Oral BID  . mouth rinse  15 mL Mouth Rinse BID  . metoprolol tartrate  12.5 mg Oral BID  . pantoprazole  40 mg Oral QAC breakfast  . potassium chloride  40 mEq Oral Daily  . simvastatin  20 mg Oral QHS  . sodium chloride flush  10-40 mL Intracatheter Q12H  . sodium chloride flush  3 mL Intravenous Q12H  . sodium chloride flush  3 mL Intravenous Q12H  . Warfarin - Pharmacist Dosing Inpatient   Does not apply q1800    Assessment: 76 yo male with aortic dissection (type 1) s/p stent grafting (9/30). He is noted with DVT  in the left Internal jugular vein and bivalirudin started by Dr. Nils Pyle on  10/7 (thrombocytopenia noted s/p surgery and concern for possible HIT). Pharmacy consulted to dose warfarin and bivalirudin -aPTT= 36 (after bivalirudin increased to 0.09 mg/kg/hr)  *Bivalirudin was being dosed with a patient wt of 75.6kg. Current patient weight is 86.7kg  Goal of Therapy:  aPTT 60-80 seconds Monitor platelets by anticoagulation protocol: Yes   Plan:  -Increase bivalirudin to 0.11 mg/kg/hr (and change dosing weight to 86.7kg) -aPTT in 2 hours  Hildred Laser, Pharm D 05/15/2017 9:20 PM

## 2017-05-15 NOTE — Progress Notes (Signed)
Subjective:  Denies SSCP, palpitations or Dyspnea Left arm hurts a bit   Objective:  Vitals:   05/15/17 0040 05/15/17 0113 05/15/17 0403 05/15/17 0405  BP: (!) 156/97 136/89  (!) 159/82  Pulse:   82 (!) 108  Resp: (!) 23 (!) 22 17 18   Temp:   98.3 F (36.8 C)   TempSrc:   Oral   SpO2:   100% (!) 77%  Weight:   191 lb 3.2 oz (86.7 kg)   Height:        Intake/Output from previous day:  Intake/Output Summary (Last 24 hours) at 05/15/17 0813 Last data filed at 05/15/17 0700  Gross per 24 hour  Intake           752.96 ml  Output              725 ml  Net            27.96 ml    Physical Exam: Affect appropriate Healthy:  appears stated age HEENT: normal Neck supple with no adenopathy JVP some swelling left side of neck and arm Lungs clear with no wheezing and good diaphragmatic motion Heart:  S1/S2 no murmur, no rub, gallop or click PMI normal Abdomen: benighn, BS positve, no tenderness, no AAA no bruit.  No HSM or HJR Distal pulses intact with no bruits Plus one LE  edema Neuro non-focal Skin warm and dry No muscular weakness    Lab Results: Basic Metabolic Panel:  Recent Labs  05/14/17 0408 05/15/17 0340  NA 139 139  K 3.0* 3.5  CL 105 104  CO2 25 27  GLUCOSE 125* 127*  BUN 17 16  CREATININE 1.03 1.00  CALCIUM 7.9* 7.9*   Liver Function Tests: No results for input(s): AST, ALT, ALKPHOS, BILITOT, PROT, ALBUMIN in the last 72 hours. No results for input(s): LIPASE, AMYLASE in the last 72 hours. CBC:  Recent Labs  05/14/17 0408 05/15/17 0340  WBC 10.4 9.3  HGB 9.6* 9.1*  HCT 28.6* 27.4*  MCV 90.5 91.3  PLT 140* 160    Anemia Panel: No results for input(s): VITAMINB12, FOLATE, FERRITIN, TIBC, IRON, RETICCTPCT in the last 72 hours.  Imaging: Dg Chest 2 View  Result Date: 05/14/2017 CLINICAL DATA:  Post op aortic arch replacement EXAM: CHEST  2 VIEW COMPARISON:  Chest x-ray dated 05/12/2017. FINDINGS: Ascending aortic stent graft appears  stable in position. Right-sided PICC line appears adequately positioned with tip at the level of the lower SVC/ cavoatrial junction. Cardiomediastinal silhouette appears stable in size and configuration. Persistent opacity at the left lung base, compatible with small pleural effusion and probable associated atelectasis. Right lung remains clear. No pneumothorax seen. Chronic compression fracture deformity at the thoracolumbar junction. No acute or suspicious osseous finding. IMPRESSION: 1. Stable small left pleural effusion with probable associated atelectasis. 2. No new lung findings.  No pneumothorax. Electronically Signed   By: Franki Cabot M.D.   On: 05/14/2017 08:17    Cardiac Studies:  ECG: SR nonspecific ST changes    Telemetry:  NSR no longer A pacing   Echo:  05/08/17 TEE EF 55-60%  Medications:   . amLODipine  5 mg Oral Daily  . aspirin EC  81 mg Oral Daily  . Chlorhexidine Gluconate Cloth  6 each Topical Daily  . citalopram  20 mg Oral Daily  . docusate sodium  200 mg Oral Daily  . furosemide  40 mg Intravenous Once  . Influenza vac split quadrivalent  PF  0.5 mL Intramuscular Tomorrow-1000  . lisinopril  20 mg Oral BID  . mouth rinse  15 mL Mouth Rinse BID  . metoprolol tartrate  12.5 mg Oral BID  . pantoprazole  40 mg Oral QAC breakfast  . potassium chloride  40 mEq Oral Daily  . potassium chloride  40 mEq Oral Once  . simvastatin  20 mg Oral QHS  . sodium chloride flush  10-40 mL Intracatheter Q12H  . sodium chloride flush  3 mL Intravenous Q12H  . sodium chloride flush  3 mL Intravenous Q12H     . sodium chloride Stopped (05/11/17 1500)  . sodium chloride    . sodium chloride Stopped (05/12/17 1801)  . sodium chloride 250 mL (05/14/17 2000)  . bivalirudin (ANGIOMAX) infusion 0.5 mg/mL (Non-ACS indications) 0.05 mg/kg/hr (05/14/17 2000)  . lactated ringers Stopped (05/09/17 2231)  . lactated ringers Stopped (05/09/17 2231)    Assessment/Plan:  Type A dissection:  remarkable recovery thanks to great surgical care with elephant trunk reconstruction , arch vessel debranching and distal stent placement. Rhythm stable. UE DVT report indicates right IJ and left cephalic but most of his pain is left arm. Being anticoagulated can be followed in our clinic. Continue to escalate BP meds to target Still progressing well   Jenkins Rouge 05/15/2017, 8:13 AM

## 2017-05-15 NOTE — Plan of Care (Signed)
Problem: Pain Managment: Goal: General experience of comfort will improve Outcome: Progressing Pain controlled at this time, has no complaints

## 2017-05-16 DIAGNOSIS — I82C11 Acute embolism and thrombosis of right internal jugular vein: Secondary | ICD-10-CM

## 2017-05-16 LAB — GLUCOSE, CAPILLARY: Glucose-Capillary: 122 mg/dL — ABNORMAL HIGH (ref 65–99)

## 2017-05-16 LAB — CBC
HCT: 26.6 % — ABNORMAL LOW (ref 39.0–52.0)
Hemoglobin: 8.9 g/dL — ABNORMAL LOW (ref 13.0–17.0)
MCH: 30.6 pg (ref 26.0–34.0)
MCHC: 33.5 g/dL (ref 30.0–36.0)
MCV: 91.4 fL (ref 78.0–100.0)
Platelets: 192 10*3/uL (ref 150–400)
RBC: 2.91 MIL/uL — ABNORMAL LOW (ref 4.22–5.81)
RDW: 14.9 % (ref 11.5–15.5)
WBC: 10.6 10*3/uL — ABNORMAL HIGH (ref 4.0–10.5)

## 2017-05-16 LAB — APTT
aPTT: 107 seconds — ABNORMAL HIGH (ref 24–36)
aPTT: 48 seconds — ABNORMAL HIGH (ref 24–36)
aPTT: 58 seconds — ABNORMAL HIGH (ref 24–36)
aPTT: 59 seconds — ABNORMAL HIGH (ref 24–36)
aPTT: 70 seconds — ABNORMAL HIGH (ref 24–36)
aPTT: 81 seconds — ABNORMAL HIGH (ref 24–36)

## 2017-05-16 LAB — PROTIME-INR
INR: 1.4
Prothrombin Time: 17.1 seconds — ABNORMAL HIGH (ref 11.4–15.2)

## 2017-05-16 MED ORDER — AMLODIPINE BESYLATE 10 MG PO TABS
10.0000 mg | ORAL_TABLET | Freq: Every day | ORAL | Status: DC
  Administered 2017-05-16 – 2017-05-20 (×5): 10 mg via ORAL
  Filled 2017-05-16 (×5): qty 1

## 2017-05-16 MED ORDER — WARFARIN SODIUM 2.5 MG PO TABS
2.5000 mg | ORAL_TABLET | Freq: Once | ORAL | Status: AC
  Administered 2017-05-16: 2.5 mg via ORAL
  Filled 2017-05-16: qty 1

## 2017-05-16 NOTE — Progress Notes (Addendum)
[address]       [place] [address]             [phone]      8 Days Post-Op Procedure(s) (LRB): REPLACEMENT OF ASCENDING AORTIC ARCH (N/A) INTRAOPERATIVE TRANSESOPHAGEAL ECHOCARDIOGRAM (N/A) AXILLARY CANNULATION (Right) HYPOTHERMIC CIRCULATORY ARREST (N/A) REPLACEMENT ASCENDING AORTA (N/A) STENT GRAFTING OF DESCENDING THORACIC AORTA STENT OF LEFT SUBCLAVIAN ARTERY with 10x39VBX DEBRANCHING OF LEFT CAROTID ARTERY AND INNOMINATE ARTERY Subjective: Feels pretty well overall  Objective: Vital signs in last 24 hours: Temp:  [98.4 F (36.9 C)-99.7 F (37.6 C)] 99.4 F (37.4 C) (10/08 0352) Pulse Rate:  [85-93] 85 (10/08 0352) Cardiac Rhythm: Normal sinus rhythm (10/08 0700) Resp:  [20-27] 20 (10/08 0352) BP: (141-171)/(78-93) 167/82 (10/08 0352) SpO2:  [94 %-99 %] 95 % (10/08 0352) Weight:  [191 lb 2.2 oz (86.7 kg)] 191 lb 2.2 oz (86.7 kg) (10/08 0352)  Hemodynamic parameters for last 24 hours:    Intake/Output from previous day: 10/07 0701 - 10/08 0700 In: 1614.6 [P.O.:1100; I.V.:514.6] Out: 3220 [Urine:3355] Intake/Output this shift: No intake/output data recorded.  General appearance: alert, cooperative and no distress Heart: regular rate and rhythm Lungs: clear to auscultation bilaterally Abdomen: benign Extremities: min edema Wound: incis healing well  Lab Results:  Recent Labs  05/15/17 0340 05/16/17 0413  WBC 9.3 10.6*  HGB 9.1* 8.9*  HCT 27.4* 26.6*  PLT 160 192   BMET:  Recent Labs  05/14/17 0408 05/15/17 0340  NA 139 139  K 3.0* 3.5  CL 105 104  CO2 25 27  GLUCOSE 125* 127*  BUN 17 16  CREATININE 1.03 1.00  CALCIUM 7.9* 7.9*    PT/INR:  Recent Labs  05/16/17 0413  LABPROT 17.1*  INR 1.40   ABG    Component Value Date/Time   PHART 7.484 (H) 05/11/2017 1058   HCO3 23.3 05/11/2017 1058   TCO2 24 05/11/2017 1058   ACIDBASEDEF 10.0 (H) 05/08/2017 1232   O2SAT 97.0 05/11/2017 1058   CBG (last 3)    Recent Labs  05/13/17 1639 05/13/17 2122 05/14/17 0632  GLUCAP 165* 112* 128*    Meds Scheduled Meds: . amLODipine  5 mg Oral Daily  . aspirin EC  81 mg Oral Daily  . Chlorhexidine Gluconate Cloth  6 each Topical Daily  . citalopram  20 mg Oral Daily  . docusate sodium  200 mg Oral Daily  . lisinopril  20 mg Oral BID  . mouth rinse  15 mL Mouth Rinse BID  . metoprolol tartrate  12.5 mg Oral BID  . pantoprazole  40 mg Oral QAC breakfast  . potassium chloride  40 mEq Oral Daily  . simvastatin  20 mg Oral QHS  . sodium chloride flush  10-40 mL Intracatheter Q12H  . sodium chloride flush  3 mL Intravenous Q12H  . sodium chloride flush  3 mL Intravenous Q12H  . Warfarin - Pharmacist Dosing Inpatient   Does not apply q1800   Continuous Infusions: . sodium chloride Stopped (05/11/17 1500)  . sodium chloride    . sodium chloride Stopped (05/12/17 1801)  . sodium chloride 250 mL (05/14/17 2000)  . bivalirudin (ANGIOMAX) infusion 0.5 mg/mL (Non-ACS indications) 0.11 mg/kg/hr (05/15/17 2130)  . lactated ringers Stopped (05/09/17 2231)  . lactated ringers Stopped (05/09/17 2231)   PRN Meds:.sodium chloride, sodium chloride, acetaminophen, bisacodyl **OR** bisacodyl, guaiFENesin, lactated ringers, magnesium hydroxide, metoprolol tartrate, midazolam, ondansetron **OR** ondansetron (ZOFRAN) IV, oxyCODONE, sodium chloride flush,  sodium chloride flush, sodium chloride flush, traMADol  Xrays No results found.  Assessment/Plan: S/P Procedure(s) (LRB): REPLACEMENT OF ASCENDING AORTIC ARCH (N/A) INTRAOPERATIVE TRANSESOPHAGEAL ECHOCARDIOGRAM (N/A) AXILLARY CANNULATION (Right) HYPOTHERMIC CIRCULATORY ARREST (N/A) REPLACEMENT ASCENDING AORTA (N/A) STENT GRAFTING OF DESCENDING THORACIC AORTA STENT OF LEFT SUBCLAVIAN ARTERY with 10x39VBX DEBRANCHING OF LEFT CAROTID ARTERY AND INNOMINATE ARTERY  1 doing very well 2 BP remains elevated, will increase norvasc to 10 3 sinus rhythm 4 INR  1.4, pharmacy dosing coumadin and bivalirudin, left hand much improved. H/H pretty stable- follow, platelets cont to improve, HIT results pending 5 sugars adeq control, A1c 6.1 in 2017, diet control 6 pacer wires - timing of removal needs to be determined 7 push pulm toilet and rehab as able  LOS: 8 days    GOLD,WAYNE E 05/16/2017  Left arm swelling much better When inr is close to 2.0, will d/c bivalirudin , then pull wires  Sternum stable well healed  I have seen and examined Don Medina and agree with the above assessment  and plan.  [name] MD [facility] [phone] Office [phone] 05/16/2017 3:01 PM

## 2017-05-16 NOTE — Progress Notes (Addendum)
ANTICOAGULATION CONSULT NOTE - Follow Up Consult  Pharmacy Consult for bivalirudin Indication: DVT w/ possible HIT  Labs:  Recent Labs (last 2 labs)    Recent Labs  05/14/17 0408 05/15/17 0340  05/16/17 0021 05/16/17 0152 05/16/17 0413  HGB 9.6* 9.1*  --   --   --  8.9*  HCT 28.6* 27.4*  --   --   --  26.6*  PLT 140* 160  --   --   --  192  APTT 35 35  < > 107* 81* 48*  LABPROT 14.4  --   --   --   --  17.1*  INR 1.13  --   --   --   --  1.40  CREATININE 1.03 1.00  --   --   --   --   < > = values in this interval not displayed.     Assessment/Plan:  76yo male with aortic dissection s/p stent grafting (9/30). He has DVT in left internal jugular vein and on bivalirudin for r/o HIT. Heparin induced platelet antibody is still in process.   APTT was delayed earlier due to issues with the lab draw. Rate had been running at 0.13 mg/kg/hr (22.5 mL/hr). However, rate was decreased to 0.11 mg/kg/hr (20 mL/hr) around noon. IV team collected result at 1225, likely on reduced rate. APTT came back at 59, slightly below therapeutic range. Anticipate reduced rate impacting aPTT results. INR today is 1.4, following warfarin 2.5 mg once last night. No signs/symptoms of bleeding noted by nursing. CBC stable.   Plan: 1) Continue bivalirudin rate at 0.13 mg/kg/hr using weight of 86.7 kg 2) Obtain aPTT in 2 hours  3) Order warfarin 2.5 mg once tonight 4) Monitor daily CBC, INR, and s/sx of bleeding  Doylene Canard, PharmD Clinical Pharmacist  Phone: [phone]

## 2017-05-16 NOTE — Progress Notes (Addendum)
ANTICOAGULATION CONSULT NOTE - Follow Up Consult  Pharmacy Consult for bivalirudin Indication: DVT w/ possible HIT  Labs:  Recent Labs (last 2 labs)    Recent Labs  05/14/17 0408 05/15/17 0340  05/16/17 0021 05/16/17 0152 05/16/17 0413  HGB 9.6* 9.1*  --   --   --  8.9*  HCT 28.6* 27.4*  --   --   --  26.6*  PLT 140* 160  --   --   --  192  APTT 35 35  < > 107* 81* 48*  LABPROT 14.4  --   --   --   --  17.1*  INR 1.13  --   --   --   --  1.40  CREATININE 1.03 1.00  --   --   --   --   < > = values in this interval not displayed.     Assessment/Plan:  76yo male therapeutic on bivalirudin after rate change (PTT of 107 was drawn from same line as bivalirudin, PTT of 81 was drawn from different lumen of PICC). Will continue gtt at current rate and confirm stable with additional PTT.   Wynona Neat, PharmD, BCPS  05/16/2017,2:36 AM   ADDENDUM: Repeat PTT now below goal.  Will increase bivalirudin gtt by 20% to 0.13 mg/kg/hr and check PTT in 2hr. VB   6:39 AM  ANTICOAGULATION CONSULT NOTE - Follow Up [place] for bivalirudin Indication: DVT w/ possible HIT  Labs:  Recent Labs (last 2 labs)    Recent Labs  05/14/17 0408 05/15/17 0340  05/16/17 0021 05/16/17 0152 05/16/17 0413  HGB 9.6* 9.1*  --   --   --  8.9*  HCT 28.6* 27.4*  --   --   --  26.6*  PLT 140* 160  --   --   --  192  APTT 35 35  < > 107* 81* 48*  LABPROT 14.4  --   --   --   --  17.1*  INR 1.13  --   --   --   --  1.40  CREATININE 1.03 1.00  --   --   --   --   < > = values in this interval not displayed.     Assessment/Plan:  76yo male therapeutic on bivalirudin after rate change (PTT of 107 was drawn from same line as bivalirudin, PTT of 81 was drawn from different lumen of PICC). Will continue gtt at current rate and confirm stable with additional PTT.   Wynona Neat, PharmD, BCPS  05/16/2017,2:36 AM   ADDENDUM: Repeat PTT now below goal.  Will  increase bivalirudin gtt by 20% to 0.13 mg/kg/hr and check PTT in 2hr. VB   6:39 AM    Note was accidentally addendum by Karn Cassis, PharmD. Original note copied prior to adjustment that was written by Wynona Neat, PharmD.

## 2017-05-16 NOTE — Progress Notes (Signed)
ANTICOAGULATION CONSULT NOTE - Follow Up Consult  Pharmacy Consult for bivalirudin Indication: DVT w/ possible HIT  Labs:  Recent Labs  05/14/17 0408 05/15/17 0340  05/16/17 0413 05/16/17 1225 05/16/17 1639 05/16/17 2251  HGB 9.6* 9.1*  --  8.9*  --   --   --   HCT 28.6* 27.4*  --  26.6*  --   --   --   PLT 140* 160  --  192  --   --   --   APTT 35 35  < > 48* 59* 58* 70*  LABPROT 14.4  --   --  17.1*  --   --   --   INR 1.13  --   --  1.40  --   --   --   CREATININE 1.03 1.00  --   --   --   --   --   < > = values in this interval not displayed.   Assessment/Plan:  76yo male therapeutic on bivalirudin after rate changes. Will continue gtt at current rate and confirm stable with am labs.   Wynona Neat, PharmD, BCPS  05/16/2017,11:48 PM

## 2017-05-16 NOTE — Care Management Important Message (Signed)
Important Message  Patient Details  Name: Don Medina MRN: [id number] Date of Birth: Jul 30, 1941   Medicare Important Message Given:  Yes    Shannette Tabares [place]ena 05/16/2017, 8:30 AM

## 2017-05-16 NOTE — Progress Notes (Signed)
Progress Note  Patient Name: Don Medina Date of Encounter: 05/16/2017  Primary Cardiologist: Don Medina (new, previously saw Don Medina in 2012)  Subjective   Feeling well.  Denies chest pain or shortness of breath.  Ambulating the halls without difficulty.   Inpatient Medications    Scheduled Meds: . amLODipine  10 mg Oral Daily  . aspirin EC  81 mg Oral Daily  . Chlorhexidine Gluconate Cloth  6 each Topical Daily  . citalopram  20 mg Oral Daily  . docusate sodium  200 mg Oral Daily  . lisinopril  20 mg Oral BID  . mouth rinse  15 mL Mouth Rinse BID  . metoprolol tartrate  12.5 mg Oral BID  . pantoprazole  40 mg Oral QAC breakfast  . potassium chloride  40 mEq Oral Daily  . simvastatin  20 mg Oral QHS  . sodium chloride flush  10-40 mL Intracatheter Q12H  . sodium chloride flush  3 mL Intravenous Q12H  . sodium chloride flush  3 mL Intravenous Q12H  . Warfarin - Pharmacist Dosing Inpatient   Does not apply q1800   Continuous Infusions: . sodium chloride Stopped (05/11/17 1500)  . sodium chloride    . sodium chloride Stopped (05/12/17 1801)  . sodium chloride 250 mL (05/14/17 2000)  . bivalirudin (ANGIOMAX) infusion 0.5 mg/mL (Non-ACS indications) 0.11 mg/kg/hr (05/15/17 2130)  . lactated ringers Stopped (05/09/17 2231)  . lactated ringers Stopped (05/09/17 2231)   PRN Meds: sodium chloride, sodium chloride, acetaminophen, bisacodyl **OR** bisacodyl, guaiFENesin, lactated ringers, magnesium hydroxide, metoprolol tartrate, midazolam, ondansetron **OR** ondansetron (ZOFRAN) IV, oxyCODONE, sodium chloride flush, sodium chloride flush, sodium chloride flush, traMADol   Vital Signs    Vitals:   05/15/17 1938 05/15/17 2356 05/16/17 0352 05/16/17 0819  BP: (!) 164/93 (!) 153/85 (!) 167/82 (!) 144/77  Pulse: 91 87 85 82  Resp: (!) 25 (!) 27 20 18   Temp: 98.4 F (36.9 C) 99.7 F (37.6 C) 99.4 F (37.4 C) 98.1 F (36.7 C)  TempSrc: Oral Oral Oral Oral  SpO2:  96% 94% 95% 99%  Weight:   86.7 kg (191 lb 2.2 oz)   Height:        Intake/Output Summary (Last 24 hours) at 05/16/17 1030 Last data filed at 05/16/17 0810  Gross per 24 hour  Intake          1494.62 ml  Output             3250 ml  Net         -1755.38 ml   Filed Weights   05/14/17 0358 05/15/17 0403 05/16/17 0352  Weight: 86.3 kg (190 lb 4.8 oz) 86.7 kg (191 lb 3.2 oz) 86.7 kg (191 lb 2.2 oz)    Telemetry    Sinus rhythm.  Short episode of SVT - Personally Reviewed  ECG    N/a  - Personally Reviewed  Physical Exam   VS:  BP (!) 144/77 (BP Location: Left Arm)   Pulse 82   Temp 98.1 F (36.7 C) (Oral)   Resp 18   Ht 5\' 8"  (1.727 m)   Wt 86.7 kg (191 lb 2.2 oz)   SpO2 99%   BMI 29.06 kg/m  , BMI Body mass index is 29.06 kg/m. GENERAL:  Well appearing HEENT: Pupils equal round and reactive, fundi not visualized, oral mucosa unremarkable NECK:  No jugular venous distention, waveform within normal limits, carotid upstroke brisk and symmetric, no bruits, no thyromegaly CHEST: Healing  midline incision LUNGS:  Clear to auscultation bilaterally HEART:  RRR.  PMI not displaced or sustained,S1 and S2 within normal limits, no S3, no S4, no clicks, no rubs, no murmurs ABD:  Flat, positive bowel sounds normal in frequency in pitch, no bruits, no rebound, no guarding, no midline pulsatile mass, no hepatomegaly, no splenomegaly EXT:  2 plus pulses throughout, no edema, no cyanosis no clubbing SKIN:  No rashes no nodules NEURO:  Cranial nerves II through XII grossly intact, motor grossly intact throughout [facility]:  Cognitively intact, oriented to person place and time   Labs    Chemistry Recent Labs Lab 05/10/17 0350  05/13/17 0512 05/14/17 0408 05/15/17 0340  NA 142  < > 138 139 139  K 3.5  < > 3.2* 3.0* 3.5  CL 110  < > 109 105 104  CO2 26  < > 26 25 27   GLUCOSE 84  < > 134* 125* 127*  BUN 27*  < > 20 17 16   CREATININE 1.43*  < > 1.11 1.03 1.00  CALCIUM 7.8*  < >  7.9* 7.9* 7.9*  PROT 4.5*  --   --   --   --   ALBUMIN 2.8*  --   --   --   --   AST 128*  --   --   --   --   ALT 116*  --   --   --   --   ALKPHOS 31*  --   --   --   --   BILITOT 1.3*  --   --   --   --   GFRNONAA 46*  < > >60 >60 >60  GFRAA 54*  < > >60 >60 >60  ANIONGAP 6  < > 3* 9 8  < > = values in this interval not displayed.   Hematology Recent Labs Lab 05/14/17 0408 05/15/17 0340 05/16/17 0413  WBC 10.4 9.3 10.6*  RBC 3.16* 3.00* 2.91*  HGB 9.6* 9.1* 8.9*  HCT 28.6* 27.4* 26.6*  MCV 90.5 91.3 91.4  MCH 30.4 30.3 30.6  MCHC 33.6 33.2 33.5  RDW 14.9 14.9 14.9  PLT 140* 160 192    Cardiac EnzymesNo results for input(s): TROPONINI in the last 168 hours. No results for input(s): TROPIPOC in the last 168 hours.   BNPNo results for input(s): BNP, PROBNP in the last 168 hours.   DDimer No results for input(s): DDIMER in the last 168 hours.   Radiology    No results found.  Cardiac Studies   TEE 05/08/17:   Left ventricle: Normal cavity size and wall thickness. LV systolic function is normal with an EF of 55-60%. There are no obvious wall motion abnormalities.  Aortic valve: The valve is trileaflet. Severe regurgitation.  Aorta: The ascending aorta is dilated. 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  Right ventricle: Cavity is moderately to severely dilated. Moderately reduced systolic function.  Right atrium: Cavity is dilated.  Aorta: Graft present in the ascending aorta.     Patient Profile     76 y.o. male here with an acute type A dissection now s/p replacement of the ascending aortic arch, stent grafting the descending thoracic aorta, stenting L subclavian, and repair of the aortic valve.   Assessment & Plan    # Type A dissection: Don Medina is doing very well after repair of the dissection with replacement of the ascending aortic arch, ascending aorta using a  28x30 Hemashield Platinum Graft, stent  grafting of the descending thoracic aorta, stenting of the descending thoracic aorta, stenting of the left subclavian, debraching of the left carotid artery, and repair of the aortic valve with resuspension.  # R IJ DVT:  Currently bridging to warfarin on Bival.  HIT panel pending.  We can arrange outpatient INR follow up when he is ready for discharge.    # Hypertension: BP above goal.  Amlodipine was increased 10/7.  Continue lisinopril and metoprolol.    For questions or updates, please contact [place] Please consult [email or web address] for contact info under Cardiology/STEMI.      Signed, Skeet Latch, MD  05/16/2017, 10:30 AM

## 2017-05-16 NOTE — Progress Notes (Signed)
CARDIAC REHAB PHASE I   PRE:  Rate/Rhythm: 76 SR  BP:  Sitting: 145/87        SaO2: 94 RA  MODE:  Ambulation: 470 ft   POST:  Rate/Rhythm: 86 SR  BP:  Sitting: 138/98         SaO2: 99 RA  Pt ambulated 470 ft on RA, IV, rolling walker, assist x1, steady gait, tolerated well with no complaints. Encouraged IS, additional ambulation x2 today. Pt to recliner after walk, call bell within reach. Will follow.  [place], RN, BSN 05/16/2017 10:35 AM

## 2017-05-16 NOTE — Progress Notes (Addendum)
ANTICOAGULATION CONSULT NOTE - Follow Up Consult  Pharmacy Consult for bivalirudin Indication: DVT w/ possible HIT  Labs:  Recent Labs (last 2 labs)    Recent Labs  05/14/17 0408 05/15/17 0340  05/16/17 0021 05/16/17 0152 05/16/17 0413  HGB 9.6* 9.1*  --   --   --  8.9*  HCT 28.6* 27.4*  --   --   --  26.6*  PLT 140* 160  --   --   --  192  APTT 35 35  < > 107* 81* 48*  LABPROT 14.4  --   --   --   --  17.1*  INR 1.13  --   --   --   --  1.40  CREATININE 1.03 1.00  --   --   --   --   < > = values in this interval not displayed.     Assessment/Plan:  76yo male with aortic dissection s/p stent grafting (9/30). He has DVT in left internal jugular vein and on bivalirudin for r/o HIT. Heparin induced platelet antibody is still in process.   APTT was delayed earlier due to issues with the lab draw. Rate had been running at 0.13 mg/kg/hr (22.5 mL/hr). However, rate was decreased to 0.11 mg/kg/hr (20 mL/hr) around noon. IV team collected result at 1225, likely on reduced rate. APTT came back at 59, slightly below therapeutic range. Anticipate reduced rate impacting aPTT results. INR today is 1.4, following warfarin 2.5 mg once last night.  Follow up aptt on 0.13mg /kg/hr resulted in an aptt just below desired range at 58s. No signs/symptoms of bleeding noted by nursing. CBC stable.   Plan: 1) Increase bivalirudin to a rate of 0.15 mg/kg/hr  2) Order warfarin 2.5 mg once tonight 3) Monitor daily CBC, INR, and s/sx of bleeding  Erin Hearing PharmD., BCPS Clinical Pharmacist Pager [phone] 05/16/2017 5:46 PM

## 2017-05-17 DIAGNOSIS — I480 Paroxysmal atrial fibrillation: Secondary | ICD-10-CM

## 2017-05-17 LAB — PROTIME-INR
INR: 1.65
Prothrombin Time: 19.3 seconds — ABNORMAL HIGH (ref 11.4–15.2)

## 2017-05-17 LAB — CBC
HCT: 27.2 % — ABNORMAL LOW (ref 39.0–52.0)
Hemoglobin: 8.9 g/dL — ABNORMAL LOW (ref 13.0–17.0)
MCH: 30.6 pg (ref 26.0–34.0)
MCHC: 32.7 g/dL (ref 30.0–36.0)
MCV: 93.5 fL (ref 78.0–100.0)
Platelets: 223 10*3/uL (ref 150–400)
RBC: 2.91 MIL/uL — ABNORMAL LOW (ref 4.22–5.81)
RDW: 15.6 % — ABNORMAL HIGH (ref 11.5–15.5)
WBC: 11.8 10*3/uL — ABNORMAL HIGH (ref 4.0–10.5)

## 2017-05-17 LAB — APTT: aPTT: 73 seconds — ABNORMAL HIGH (ref 24–36)

## 2017-05-17 MED ORDER — WARFARIN SODIUM 4 MG PO TABS
4.0000 mg | ORAL_TABLET | Freq: Once | ORAL | Status: AC
  Administered 2017-05-17: 4 mg via ORAL
  Filled 2017-05-17: qty 1

## 2017-05-17 MED ORDER — WARFARIN VIDEO
Freq: Once | Status: AC
  Administered 2017-05-17: 17:00:00

## 2017-05-17 MED ORDER — WARFARIN SODIUM 2.5 MG PO TABS: 2.5000 mg | ORAL_TABLET | Freq: Once | ORAL | Status: DC

## 2017-05-17 MED ORDER — HYDROCORTISONE 1 % EX CREA
TOPICAL_CREAM | Freq: Three times a day (TID) | CUTANEOUS | Status: DC
  Administered 2017-05-17 – 2017-05-19 (×5): via TOPICAL
  Filled 2017-05-17: qty 28

## 2017-05-17 MED ORDER — COUMADIN BOOK
Freq: Once | Status: AC
  Administered 2017-05-17: 17:00:00
  Filled 2017-05-17: qty 1

## 2017-05-17 MED ORDER — AMIODARONE HCL 200 MG PO TABS
400.0000 mg | ORAL_TABLET | Freq: Two times a day (BID) | ORAL | Status: DC
  Administered 2017-05-17 – 2017-05-20 (×7): 400 mg via ORAL
  Filled 2017-05-17 (×7): qty 2

## 2017-05-17 MED ORDER — METOPROLOL TARTRATE 25 MG PO TABS
25.0000 mg | ORAL_TABLET | Freq: Two times a day (BID) | ORAL | Status: DC
  Administered 2017-05-17 – 2017-05-19 (×5): 25 mg via ORAL
  Filled 2017-05-17 (×5): qty 1

## 2017-05-17 NOTE — Progress Notes (Addendum)
ANTICOAGULATION CONSULT NOTE - Follow Up Consult  Pharmacy Consult for bivalirudin Indication: DVT w/ possible HIT  Labs:  Recent Labs (last 2 labs)    Recent Labs  05/14/17 0408 05/15/17 0340  05/16/17 0021 05/16/17 0152 05/16/17 0413  HGB 9.6* 9.1*  --   --   --  8.9*  HCT 28.6* 27.4*  --   --   --  26.6*  PLT 140* 160  --   --   --  192  APTT 35 35  < > 107* 81* 48*  LABPROT 14.4  --   --   --   --  17.1*  INR 1.13  --   --   --   --  1.40  CREATININE 1.03 1.00  --   --   --   --   < > = values in this interval not displayed.     Assessment/Plan:  76yo male with aortic dissection s/p stent grafting (9/30). He has DVT in left internal jugular vein and on bivalirudin for r/o HIT. Heparin induced platelet antibody is still in process-confirmed with send out lab center that should have result by tomorrow afternoon/evening.  Last aPTT was within therapeutic range. Follow up aPTT came back again within goal range at 73 (goal 60-80). INR changed from 1.4 to 1.65 following second dose of warfarin 2.5 mg. On 3rd day of overlap with bivalirudin. Started on amiodarone today, which will have a delayed impact on INR results. CBC is stable. No issues with the infusion noted by nursing. No signs/symptoms of bleeding.     Due to potentially falsely elevating INR while on bivalirudin, it is recommended to overlap for at least 5 days, until INR > or equal to 3, and INR remains therapeutic after INR check with bivalirudin stopped. Could consider the use of fondaparinux instead, given CrCl>30 mL/min to aid in bridging if considering discharge.   Plan: 1) Continue bivalirudin at a rate of 0.15 mg/kg/hr  2) Warfarin 4 mg once tonight 3) Monitor daily CBC, aPTT,  INR, and s/sx of bleeding 4) Follow up on heparin induced platelet antibody lab  Doylene Canard, PharmD Clinical Pharmacist  Phone: [phone] 05/17/2017 7:57 AM

## 2017-05-17 NOTE — Discharge Instructions (Addendum)
Aortic Valve Repair, Care After Refer to this sheet in the next few weeks. These instructions provide you with information about caring for yourself after your procedure. Your health care provider may also give you more specific instructions. Your treatment has been planned according to current medical practices, but problems sometimes occur. Call your health care provider if you have any problems or questions after your procedure. What can I expect after the procedure? After the procedure, it is common to have:  Pain around your incision area.  A small amount of blood or clear fluid coming from your incision.  Follow these instructions at home: Eating and drinking   Follow instructions from your health care provider about eating or drinking restrictions. ? Limit alcohol intake to no more than 1 drink per day for nonpregnant women and 2 drinks per day for men. One drink equals 12 oz of beer, 5 oz of wine, or 1 oz of hard liquor. ? Limit how much caffeine you drink. Caffeine can affect your heart's rate and rhythm.  Drink enough fluid to keep your urine clear or pale yellow.  Eat a heart-healthy diet. This should include plenty of fresh fruits and vegetables. If you eat meat, it should be lean cuts. Avoid foods that are: ? High in salt, saturated fat, or sugar. ? Canned or highly processed. ? Fried. Activity  Return to your normal activities as told by your health care provider. Ask your health care provider what activities are safe for you.  Exercise regularly once you have recovered, as told by your health care provider.  Avoid sitting for more than 2 hours at a time without moving. Get up and move around at least once every 1-2 hours. This helps to prevent blood clots in the legs.  Do not lift anything that is heavier than 10 lb (4.5 kg) until your health care provider approves.  Avoid pushing or pulling things with your arms until your health care provider approves. This includes  pulling on handrails to help you climb stairs. Incision care   Follow instructions from your health care provider about how to take care of your incision. Make sure you: ? Wash your hands with soap and water before you change your bandage (dressing). If soap and water are not available, use hand sanitizer. ? Change your dressing as told by your health care provider. ? Leave stitches (sutures), skin glue, or adhesive strips in place. These skin closures may need to stay in place for 2 weeks or longer. If adhesive strip edges start to loosen and curl up, you may trim the loose edges. Do not remove adhesive strips completely unless your health care provider tells you to do that.  Check your incision area every day for signs of infection. Check for: ? More redness, swelling, or pain. ? More fluid or blood. ? Warmth. ? Pus or a bad smell. Medicines  Take over-the-counter and prescription medicines only as told by your health care provider.  If you were prescribed an antibiotic medicine, take it as told by your health care provider. Do not stop taking the antibiotic even if you start to feel better. Travel  Avoid airplane travel for as long as told by your health care provider.  When you travel, bring a list of your medicines and a record of your medical history with you. Carry your medicines with you. Driving  Ask your health care provider when it is safe for you to drive. Do not drive until your health  care provider approves.  Do not drive or operate heavy machinery while taking prescription pain medicine. Lifestyle   Do not use any tobacco products, such as cigarettes, chewing tobacco, or e-cigarettes. If you need help quitting, ask your health care provider.  Resume sexual activity as told by your health care provider. Do not use medicines for erectile dysfunction unless your health care provider approves, if this applies.  Work with your health care provider to keep your blood  pressure and cholesterol under control, and to manage any other heart conditions that you have.  Maintain a healthy weight. General instructions  Do not take baths, swim, or use a hot tub until your health care provider approves.  Do not strain to have a bowel movement.  Avoid crossing your legs while sitting down.  Check your temperature every day for a fever. A fever may be a sign of infection.  If you are a woman and you plan to become pregnant, talk with your health care provider before you become pregnant.  Wear compression stockings if your health care provider instructs you to do this. These stockings help to prevent blood clots and reduce swelling in your legs.  Tell all health care providers who care for you that you have an artificial (prosthetic) aortic valve. If you have or have had heart disease or endocarditis, tell all health care providers about these conditions as well.  Keep all follow-up visits as told by your health care provider. This is important. Contact a health care provider if:  You develop a skin rash.  You experience sudden, unexplained changes in your weight.  You have more redness, swelling, or pain around your incision.  You have more fluid or blood coming from your incision.  Your incision feels warm to the touch.  You have pus or a bad smell coming from your incision.  You have a fever. Get help right away if:  You develop chest pain that is different from the pain coming from your incision.  You develop shortness of breath or difficulty breathing.  You start to feel light-headed. These symptoms may represent a serious problem that is an emergency. Do not wait to see if the symptoms will go away. Get medical help right away. Call your local emergency services (911 in the [place] Do not drive yourself to the hospital. This information is not intended to replace advice given to you by your health care provider. Make sure you discuss any  questions you have with your health care provider. Document Released: 02/11/2005 Document Revised: 01/01/2016 Document Reviewed: 06/29/2015 Elsevier Interactive Patient Education  2017 [place] on my medicine - Coumadin   (Warfarin)  This medication education was reviewed with me or my healthcare representative as part of my discharge preparation.  The pharmacist that spoke with me during my hospital stay was:  Betsey Holiday, [facility]  Why was Coumadin prescribed for you? Coumadin was prescribed for you because you have a blood clot or a medical condition that can cause an increased risk of forming blood clots. Blood clots can cause serious health problems by blocking the flow of blood to the heart, lung, or brain. Coumadin can prevent harmful blood clots from forming. As a reminder your indication for Coumadin is:   Deep Vein Thrombosis Treatment  What test will check on my response to Coumadin? While on Coumadin (warfarin) you will need to have an INR test regularly to ensure that your dose is keeping  you in the desired range. The INR (international normalized ratio) number is calculated from the result of the laboratory test called prothrombin time (PT).  If an INR APPOINTMENT HAS NOT ALREADY BEEN MADE FOR YOU please schedule an appointment to have this lab work done by your health care provider within 7 days. Your INR goal is usually a number between:  2 to 3 or your provider may give you a more narrow range like 2-2.5.  Ask your health care provider during an office visit what your goal INR is.  What  do you need to  know  About  COUMADIN? Take Coumadin (warfarin) exactly as prescribed by your healthcare provider about the same time each day.  DO NOT stop taking without talking to the doctor who prescribed the medication.  Stopping without other blood clot prevention medication to take the place of Coumadin may increase your risk of developing a new clot or stroke.   Get refills before you run out.  What do you do if you miss a dose? If you miss a dose, take it as soon as you remember on the same day then continue your regularly scheduled regimen the next day.  Do not take two doses of Coumadin at the same time.  Important Safety Information A possible side effect of Coumadin (Warfarin) is an increased risk of bleeding. You should call your healthcare provider right away if you experience any of the following: ? Bleeding from an injury or your nose that does not stop. ? Unusual colored urine (red or dark brown) or unusual colored stools (red or black). ? Unusual bruising for unknown reasons. ? A serious fall or if you hit your head (even if there is no bleeding).  Some foods or medicines interact with Coumadin (warfarin) and might alter your response to warfarin. To help avoid this: ? Eat a balanced diet, maintaining a consistent amount of Vitamin K. ? Notify your provider about major diet changes you plan to make. ? Avoid alcohol or limit your intake to 1 drink for women and 2 drinks for men per day. (1 drink is 5 oz. wine, 12 oz. beer, or 1.5 oz. liquor.)  Make sure that ANY health care provider who prescribes medication for you knows that you are taking Coumadin (warfarin).  Also make sure the healthcare provider who is monitoring your Coumadin knows when you have started a new medication including herbals and non-prescription products.  Coumadin (Warfarin)  Major Drug Interactions  Increased Warfarin Effect Decreased Warfarin Effect  Alcohol (large quantities) Antibiotics (esp. Septra/Bactrim, Flagyl, Cipro) Amiodarone (Cordarone) Aspirin (ASA) Cimetidine (Tagamet) Megestrol (Megace) NSAIDs (ibuprofen, naproxen, etc.) Piroxicam (Feldene) Propafenone (Rythmol SR) Propranolol (Inderal) Isoniazid (INH) Posaconazole (Noxafil) Barbiturates (Phenobarbital) Carbamazepine (Tegretol) Chlordiazepoxide (Librium) Cholestyramine  (Questran) Griseofulvin Oral Contraceptives Rifampin Sucralfate (Carafate) Vitamin K   Coumadin (Warfarin) Major Herbal Interactions  Increased Warfarin Effect Decreased Warfarin Effect  Garlic Ginseng Ginkgo biloba Coenzyme Q10 Green tea St. Johns wort    Coumadin (Warfarin) FOOD Interactions  Eat a consistent number of servings per week of foods HIGH in Vitamin K (1 serving =  cup)  Collards (cooked, or boiled & drained) Kale (cooked, or boiled & drained) Mustard greens (cooked, or boiled & drained) Parsley *serving size only =  cup Spinach (cooked, or boiled & drained) Swiss chard (cooked, or boiled & drained) Turnip greens (cooked, or boiled & drained)  Eat a consistent number of servings per week of foods MEDIUM-HIGH in Vitamin K (1 serving = 1  cup)  Asparagus (cooked, or boiled & drained) Broccoli (cooked, boiled & drained, or raw & chopped) Brussel sprouts (cooked, or boiled & drained) *serving size only =  cup Lettuce, raw (green leaf, endive, romaine) Spinach, raw Turnip greens, raw & chopped   These websites have more information on Coumadin (warfarin):  FailFactory.se; VeganReport.com.au;

## 2017-05-17 NOTE — Progress Notes (Signed)
CARDIAC REHAB PHASE I   PRE:  Rate/Rhythm: 78 SR    BP: sitting 130/61    SaO2: 98 RA  MODE:  Ambulation: 470 ft   POST:  Rate/Rhythm: 92 SR    BP: sitting 136/73     SaO2: 93 RA  Pt moving well. Able to stand and walk with RW. Veered right at times, hit objects on right. ? If this was just RW pulling right. Had pt walk without RW for 50 ft, which he was steady. He prefers using RW. Pt with significant DOE, had some wheeze. Rested x2 and did not want to increase distance due to SOB.  Resolved with rest. Pt had occasional PACs once sitting in recliner. No afib. Will f/u, encouraged x2 more walks. [phone]  [place], ACSM 05/17/2017 11:20 AM

## 2017-05-17 NOTE — Progress Notes (Signed)
Patient had episode of Atrial fibrillation into the 170's.  Gave PO metoprolol and amiodarone.  Additionally, 5 mg dose IV metoprolol. Patient HR returned to 80's sinus rhythm with some bigeminy and PVC's.  Patient currently sinus 80. Pt resting with call bell within reach.  Will continue to monitor. Payton Emerald, RN

## 2017-05-17 NOTE — Progress Notes (Addendum)
[address]       [place] [address]             [phone]      9 Days Post-Op Procedure(s) (LRB): REPLACEMENT OF ASCENDING AORTIC ARCH (N/A) INTRAOPERATIVE TRANSESOPHAGEAL ECHOCARDIOGRAM (N/A) AXILLARY CANNULATION (Right) HYPOTHERMIC CIRCULATORY ARREST (N/A) REPLACEMENT ASCENDING AORTA (N/A) STENT GRAFTING OF DESCENDING THORACIC AORTA STENT OF LEFT SUBCLAVIAN ARTERY with 10x39VBX DEBRANCHING OF LEFT CAROTID ARTERY AND INNOMINATE ARTERY Subjective: Went into afib with RVR , Feels pretty well overall  Objective: Vital signs in last 24 hours: Temp:  [97.6 F (36.4 C)-98.8 F (37.1 C)] 98.2 F (36.8 C) (10/09 0429) Pulse Rate:  [67-86] 67 (10/09 0429) Cardiac Rhythm: Normal sinus rhythm (10/09 0429) Resp:  [16-25] 16 (10/09 0429) BP: (123-156)/(69-86) 123/69 (10/09 0429) SpO2:  [92 %-100 %] 98 % (10/09 0429) Weight:  [192 lb 4.8 oz (87.2 kg)] 192 lb 4.8 oz (87.2 kg) (10/09 0429)  Hemodynamic parameters for last 24 hours:    Intake/Output from previous day: 10/08 0701 - 10/09 0700 In: 600 [P.O.:600] Out: 1550 [Urine:1550] Intake/Output this shift: No intake/output data recorded.  General appearance: alert, cooperative and no distress Heart: regular rate and rhythm and some irregular beats Lungs: mildly dim in bases Abdomen: benign Extremities: min edema Wound: incis healing well  Lab Results:  Recent Labs  05/16/17 0413 05/17/17 0447  WBC 10.6* 11.8*  HGB 8.9* 8.9*  HCT 26.6* 27.2*  PLT 192 223   BMET:  Recent Labs  05/15/17 0340  NA 139  K 3.5  CL 104  CO2 27  GLUCOSE 127*  BUN 16  CREATININE 1.00  CALCIUM 7.9*    PT/INR:  Recent Labs  05/17/17 0447  LABPROT 19.3*  INR 1.65   ABG    Component Value Date/Time   PHART 7.484 (H) 05/11/2017 1058   HCO3 23.3 05/11/2017 1058   TCO2 24 05/11/2017 1058   ACIDBASEDEF 10.0 (H) 05/08/2017 1232   O2SAT 97.0 05/11/2017 1058   CBG (last 3)   Recent Labs   05/16/17 1227  GLUCAP 122*    Meds Scheduled Meds: . amLODipine  10 mg Oral Daily  . aspirin EC  81 mg Oral Daily  . Chlorhexidine Gluconate Cloth  6 each Topical Daily  . citalopram  20 mg Oral Daily  . docusate sodium  200 mg Oral Daily  . lisinopril  20 mg Oral BID  . mouth rinse  15 mL Mouth Rinse BID  . metoprolol tartrate  12.5 mg Oral BID  . pantoprazole  40 mg Oral QAC breakfast  . potassium chloride  40 mEq Oral Daily  . simvastatin  20 mg Oral QHS  . sodium chloride flush  10-40 mL Intracatheter Q12H  . sodium chloride flush  3 mL Intravenous Q12H  . sodium chloride flush  3 mL Intravenous Q12H  . Warfarin - Pharmacist Dosing Inpatient   Does not apply q1800   Continuous Infusions: . sodium chloride Stopped (05/11/17 1500)  . sodium chloride    . sodium chloride Stopped (05/12/17 1801)  . sodium chloride 250 mL (05/14/17 2000)  . bivalirudin (ANGIOMAX) infusion 0.5 mg/mL (Non-ACS indications) 0.15 mg/kg/hr (05/16/17 1750)  . lactated ringers Stopped (05/09/17 2231)  . lactated ringers Stopped (05/09/17 2231)   PRN Meds:.sodium chloride, sodium chloride, acetaminophen, bisacodyl **OR** bisacodyl, guaiFENesin, lactated ringers, magnesium hydroxide, metoprolol tartrate, midazolam, ondansetron **OR** ondansetron (ZOFRAN) IV, oxyCODONE, sodium chloride flush, sodium chloride flush, sodium chloride flush, traMADol  Xrays No results found.  Assessment/Plan: S/P Procedure(s) (LRB): REPLACEMENT OF ASCENDING AORTIC ARCH (N/A) INTRAOPERATIVE TRANSESOPHAGEAL ECHOCARDIOGRAM (N/A) AXILLARY CANNULATION (Right) HYPOTHERMIC CIRCULATORY ARREST (N/A) REPLACEMENT ASCENDING AORTA (N/A) STENT GRAFTING OF DESCENDING THORACIC AORTA STENT OF LEFT SUBCLAVIAN ARTERY with 10x39VBX DEBRANCHING OF LEFT CAROTID ARTERY AND INNOMINATE ARTERY   1 afib with RVR, will add amiodarone po, he is already on coumadin. Increase beta blocker  dose 2 BP control a little better, may need additional  agent - monitor for now 3 BS ok control 4 INR 1.65- d/cwires soon, conts bivalirudin- left arm conts to improve 5 routine pulm toilet and rehab   LOS: 9 days    GOLD,WAYNE E 05/17/2017  now back in sinus , afib this am Likely will be able to stop bivalrudin  Tomorrow and continue coumadin and get pacing wires out  Left arm not swollen now  I have seen and examined [name] and agree with the above assessment  and plan.  [name] MD [facility] [phone] Office [phone] 05/17/2017 9:59 AM

## 2017-05-17 NOTE — Progress Notes (Addendum)
Telemetry notified this RN, that patients heart rate up to 170 SVT. Went in to assess patient, patient calmly resting in chair  Stated that he had just stood up to urinate. Patient mentioned feeling flutter in chest briefly. Gave 5mg  IV metoprolol per PRN order. Heart rate down to 70's patient resting comfortably. Will continue to closely monitor patient .  Lester Kinsman, RN

## 2017-05-17 NOTE — Progress Notes (Addendum)
Progress Note  Patient Name: Don Medina Date of Encounter: 05/17/2017  Primary Cardiologist: Dr. Johnsie Cancel (new, previously saw Dr. Lovena Le in 2012)  Subjective   Feeling well. Noted palpitations when in atrial fibrillation.  No chest pain or shortness of breath.   Inpatient Medications    Scheduled Meds: . amiodarone  400 mg Oral BID  . amLODipine  10 mg Oral Daily  . aspirin EC  81 mg Oral Daily  . Chlorhexidine Gluconate Cloth  6 each Topical Daily  . citalopram  20 mg Oral Daily  . docusate sodium  200 mg Oral Daily  . lisinopril  20 mg Oral BID  . mouth rinse  15 mL Mouth Rinse BID  . metoprolol tartrate  25 mg Oral BID  . pantoprazole  40 mg Oral QAC breakfast  . potassium chloride  40 mEq Oral Daily  . simvastatin  20 mg Oral QHS  . sodium chloride flush  10-40 mL Intracatheter Q12H  . sodium chloride flush  3 mL Intravenous Q12H  . sodium chloride flush  3 mL Intravenous Q12H  . warfarin  2.5 mg Oral ONCE-1800  . Warfarin - Pharmacist Dosing Inpatient   Does not apply q1800   Continuous Infusions: . sodium chloride Stopped (05/11/17 1500)  . sodium chloride    . sodium chloride Stopped (05/12/17 1801)  . sodium chloride 250 mL (05/14/17 2000)  . bivalirudin (ANGIOMAX) infusion 0.5 mg/mL (Non-ACS indications) 0.15 mg/kg/hr (05/16/17 1750)  . lactated ringers Stopped (05/09/17 2231)  . lactated ringers Stopped (05/09/17 2231)   PRN Meds: sodium chloride, sodium chloride, acetaminophen, bisacodyl **OR** bisacodyl, guaiFENesin, lactated ringers, magnesium hydroxide, metoprolol tartrate, midazolam, ondansetron **OR** ondansetron (ZOFRAN) IV, oxyCODONE, sodium chloride flush, sodium chloride flush, sodium chloride flush, traMADol   Vital Signs    Vitals:   05/17/17 0429 05/17/17 0808 05/17/17 0812 05/17/17 0904  BP: 123/69 (!) 148/94  130/61  Pulse: 67 (!) 110 (!) 148   Resp: 16 (!) 21    Temp: 98.2 F (36.8 C) 98.6 F (37 C)    TempSrc: Oral Oral      SpO2: 98% 96%    Weight: 87.2 kg (192 lb 4.8 oz)     Height:        Intake/Output Summary (Last 24 hours) at 05/17/17 0937 Last data filed at 05/17/17 0400  Gross per 24 hour  Intake              240 ml  Output             1050 ml  Net             -810 ml   Filed Weights   05/15/17 0403 05/16/17 0352 05/17/17 0429  Weight: 86.7 kg (191 lb 3.2 oz) 86.7 kg (191 lb 2.2 oz) 87.2 kg (192 lb 4.8 oz)    Telemetry    Sinus rhythm.  Atrial fibrillation with RVR now back in sinus rhythm.  Personally Reviewed  ECG    N/a  - Personally Reviewed  Physical Exam   VS:  BP 130/61   Pulse (!) 148   Temp 98.6 F (37 C) (Oral)   Resp (!) 21   Ht 5\' 8"  (1.727 m)   Wt 87.2 kg (192 lb 4.8 oz)   SpO2 96%   BMI 29.24 kg/m  , BMI Body mass index is 29.24 kg/m. GENERAL:  Well appearing HEENT: Pupils equal round and reactive, fundi not visualized, oral mucosa unremarkable NECK:  No jugular venous distention, waveform within normal limits, carotid upstroke brisk and symmetric, no bruits, no thyromegaly CHEST: Healing midline incision LUNGS:  Clear to auscultation bilaterally HEART:  RRR.  PMI not displaced or sustained,S1 and S2 within normal limits, no S3, no S4, no clicks, no rubs, no murmurs ABD:  Flat, positive bowel sounds normal in frequency in pitch, no bruits, no rebound, no guarding, no midline pulsatile mass, no hepatomegaly, no splenomegaly EXT:  2 plus pulses throughout, no edema, no cyanosis no clubbing SKIN:  No rashes no nodules NEURO:  Cranial nerves II through XII grossly intact, motor grossly intact throughout [facility]:  Cognitively intact, oriented to person place and time   Labs    Chemistry  Recent Labs Lab 05/13/17 0512 05/14/17 0408 05/15/17 0340  NA 138 139 139  K 3.2* 3.0* 3.5  CL 109 105 104  CO2 26 25 27   GLUCOSE 134* 125* 127*  BUN 20 17 16   CREATININE 1.11 1.03 1.00  CALCIUM 7.9* 7.9* 7.9*  GFRNONAA >60 >60 >60  GFRAA >60 >60 >60  ANIONGAP 3* 9 8      Hematology  Recent Labs Lab 05/15/17 0340 05/16/17 0413 05/17/17 0447  WBC 9.3 10.6* 11.8*  RBC 3.00* 2.91* 2.91*  HGB 9.1* 8.9* 8.9*  HCT 27.4* 26.6* 27.2*  MCV 91.3 91.4 93.5  MCH 30.3 30.6 30.6  MCHC 33.2 33.5 32.7  RDW 14.9 14.9 15.6*  PLT 160 192 223    Cardiac EnzymesNo results for input(s): TROPONINI in the last 168 hours. No results for input(s): TROPIPOC in the last 168 hours.   BNPNo results for input(s): BNP, PROBNP in the last 168 hours.   DDimer No results for input(s): DDIMER in the last 168 hours.   Radiology    No results found.  Cardiac Studies   TEE 05/08/17:   Left ventricle: Normal cavity size and wall thickness. LV systolic function is normal with an EF of 55-60%. There are no obvious wall motion abnormalities.  Aortic valve: The valve is trileaflet. Severe regurgitation.  Aorta: The ascending aorta is dilated. 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  Right ventricle: Cavity is moderately to severely dilated. Moderately reduced systolic function.  Right atrium: Cavity is dilated.  Aorta: Graft present in the ascending aorta.     Patient Profile     76 y.o. male here with an acute type A dissection now s/p replacement of the ascending aortic arch, stent grafting the descending thoracic aorta, stenting L subclavian, and repair of the aortic valve.   Assessment & Plan    # Type A dissection: [name] is doing very well after repair of the dissection with replacement of the ascending aortic arch, ascending aorta using a 28x30 Hemashield Platinum Graft, stent grafting of the descending thoracic aorta, stenting of the descending thoracic aorta, stenting of the left subclavian, debraching of the left carotid artery, and repair of the aortic valve with resuspension.  # R IJ DVT:  Currently bridging to warfarin on Bival.  INR 1.65 today.  Goal 2-3.  HIT panel still pending.  We can arrange  outpatient INR follow up when he is ready for discharge.   # Post-op atrial fibrillation:  [name] developed paroxysmal atrial fibrillation on 10/8.  Agree with starting amiodarone and increasing metoprolol.    # Hypertension: BP remains above goal.  Metoprolol increased 10/9 for atrial fibrillation.  Amlodipine was increased 10/7.  Continue lisinopril.  For questions or updates, please contact [place] Please consult [email or web address] for contact info under Cardiology/STEMI.      Signed, Skeet Latch, MD  05/17/2017, 9:37 AM

## 2017-05-17 NOTE — Progress Notes (Signed)

## 2017-05-18 ENCOUNTER — Inpatient Hospital Stay (HOSPITAL_COMMUNITY): Payer: Medicare HMO

## 2017-05-18 LAB — COMPREHENSIVE METABOLIC PANEL
ALT: 28 U/L (ref 17–63)
AST: 25 U/L (ref 15–41)
Albumin: 2.7 g/dL — ABNORMAL LOW (ref 3.5–5.0)
Alkaline Phosphatase: 54 U/L (ref 38–126)
Anion gap: 11 (ref 5–15)
BUN: 16 mg/dL (ref 6–20)
CO2: 24 mmol/L (ref 22–32)
Calcium: 8.2 mg/dL — ABNORMAL LOW (ref 8.9–10.3)
Chloride: 103 mmol/L (ref 101–111)
Creatinine, Ser: 1.09 mg/dL (ref 0.61–1.24)
GFR calc Af Amer: >60 mL/min (ref >60)
GFR calc non Af Amer: >60 mL/min (ref >60)
Glucose, Bld: 129 mg/dL — ABNORMAL HIGH (ref 65–99)
Potassium: 4.1 mmol/L (ref 3.5–5.1)
Sodium: 138 mmol/L (ref 135–145)
Total Bilirubin: 1.1 mg/dL (ref 0.3–1.2)
Total Protein: 5.5 g/dL — ABNORMAL LOW (ref 6.5–8.1)

## 2017-05-18 LAB — APTT: aPTT: 67 seconds — ABNORMAL HIGH (ref 24–36)

## 2017-05-18 LAB — CBC
HCT: 27.2 % — ABNORMAL LOW (ref 39.0–52.0)
Hemoglobin: 9 g/dL — ABNORMAL LOW (ref 13.0–17.0)
MCH: 30.7 pg (ref 26.0–34.0)
MCHC: 33.1 g/dL (ref 30.0–36.0)
MCV: 92.8 fL (ref 78.0–100.0)
Platelets: 262 10*3/uL (ref 150–400)
RBC: 2.93 MIL/uL — ABNORMAL LOW (ref 4.22–5.81)
RDW: 15.4 % (ref 11.5–15.5)
WBC: 12.5 10*3/uL — ABNORMAL HIGH (ref 4.0–10.5)

## 2017-05-18 LAB — HEPARIN INDUCED PLATELET AB (HIT ANTIBODY): Heparin Induced Plt Ab: 1.552 OD — ABNORMAL HIGH (ref 0.000–0.400)

## 2017-05-18 LAB — PROTIME-INR
INR: 1.65
Prothrombin Time: 19.4 seconds — ABNORMAL HIGH (ref 11.4–15.2)

## 2017-05-18 MED ORDER — FUROSEMIDE 40 MG PO TABS
40.0000 mg | ORAL_TABLET | Freq: Every day | ORAL | Status: DC
  Administered 2017-05-18 – 2017-05-20 (×3): 40 mg via ORAL
  Filled 2017-05-18 (×3): qty 1

## 2017-05-18 MED ORDER — POTASSIUM CHLORIDE CRYS ER 20 MEQ PO TBCR
20.0000 meq | EXTENDED_RELEASE_TABLET | Freq: Every day | ORAL | Status: DC
  Administered 2017-05-18 – 2017-05-20 (×3): 20 meq via ORAL
  Filled 2017-05-18 (×3): qty 1

## 2017-05-18 MED ORDER — WARFARIN SODIUM 5 MG PO TABS
5.0000 mg | ORAL_TABLET | Freq: Once | ORAL | Status: AC
  Administered 2017-05-18: 5 mg via ORAL
  Filled 2017-05-18: qty 1

## 2017-05-18 NOTE — Progress Notes (Signed)
Patient is walking from one end of the hallway to the other with daughter at his side. Denies pain at this time. Will continue to monitor

## 2017-05-18 NOTE — Progress Notes (Signed)
CARDIAC REHAB PHASE I   PRE:  Rate/Rhythm: 88 SR with PACs    BP: sitting 147/68     SaO2: 95 RA  MODE:  Ambulation: 670 ft   POST:  Rate/Rhythm: 105ST    BP: sitting 152/72     SaO2: 95 RA  Pt feeling well but more swollen. Able to stand and walk independently. I pushed IV pole and lightly held his arm. Quick pace. Less SOB today. x1 rest stop. Encouraged pt to increase distance.  [id number]   [place], ACSM 05/18/2017 10:47 AM

## 2017-05-18 NOTE — Progress Notes (Signed)
Pharmacy Heparin Induced Thrombocytopenia (HIT) Note:  Don Medina is a 76 y.o. male being evaluated for HIT. Heparin was given in OR on 9/20 baseline platelets were 141 on 9/29. Platelet count dropped to a nadir of 64 on 05/11/17 and increased back to baseline ~ 10/6. He is also noted with a DVT. The timing of platelet fall appears early and there are no records to indicate heparin exposure in the last 100 days.  -heparin antibody: 1.552  (positive)  Heparin Induced Plt Ab  Date/Time Value Ref Range Status  05/14/2017 06:37 PM 1.552 (H) 0.000 - 0.400 OD Final    Comment:    (NOTE) Performed At: [facility] [place], [place] [id number] Lindon Romp MD VQ:[id number]      Score = 0 Score = 1 Score = 2 Calculated Score  Thrombocytopenia -Platelet fall < 30% -Any platelet fall with nadir < 10 -Platelet fall >50% BUT surgery within 3 days  -Any combination of platelet fall/nadir that does not fit score 0 or 2 -Platelet fall >50% AND nadir ?20 AND no surgery within 3 days     1  Timing -Platelets fall on days ? 4  AND no prior heparin exposure in previous 100 days -Consistent w/ platelet fall on days 5-10 but missing counts -Platelet fall within 1 day of start AND prior exposure to heparin within previous 31-100 days -Platelet fall after day 10 -Platelet fall 5-10 days after starting heparin -Platelet fall within 1 day of heparin start AND prior exposure within previous 5-30 days 2  Thrombosis -None suspected -Recurrent thrombosis in patient receiving therapeutic anticoagulants -Suspected thrombosis (awaiting confirmation via imaging) -Erythematous skin lesions at heparin injection sites -Confirmed new thrombosis -Skin necrosis at injection site -Anaphylactoid reaction to heparin IV bolus -Adrenal hemorrhage 2  oTher Causes -Probable other cause (see protocol for full list) -Possible other cause evident (see protocol for full list) -No alternative explanation  1  Total Calculated 4T Score:      6   Name of MD Contacted: Dr. Servando Snare  Algorithm Recommendation: Possible HIT:  Order SRA.  Initiate alternative anticoagulant.  Document heparin allergy.  Plan: -He is already on bivalirudin.   -Order SRA and document heparin allergy -Follow SRA results  Hildred Laser, Pharm D 05/18/2017 6:06 PM

## 2017-05-18 NOTE — Progress Notes (Addendum)
[address]       [place] [address]             [phone]      10 Days Post-Op Procedure(s) (LRB): REPLACEMENT OF ASCENDING AORTIC ARCH (N/A) INTRAOPERATIVE TRANSESOPHAGEAL ECHOCARDIOGRAM (N/A) AXILLARY CANNULATION (Right) HYPOTHERMIC CIRCULATORY ARREST (N/A) REPLACEMENT ASCENDING AORTA (N/A) STENT GRAFTING OF DESCENDING THORACIC AORTA STENT OF LEFT SUBCLAVIAN ARTERY with 10x39VBX DEBRANCHING OF LEFT CAROTID ARTERY AND INNOMINATE ARTERY Subjective: conts to feel well, some frequent PAC's at times  Objective: Vital signs in last 24 hours: Temp:  [98.2 F (36.8 C)-99 F (37.2 C)] 98.4 F (36.9 C) (10/10 0349) Pulse Rate:  [92-148] 92 (10/09 1602) Cardiac Rhythm: Normal sinus rhythm (10/10 0500) Resp:  [20-28] 27 (10/10 0411) BP: (130-161)/(61-94) 144/89 (10/10 0349) SpO2:  [95 %-98 %] 98 % (10/09 2020) Weight:  [194 lb 4.8 oz (88.1 kg)] 194 lb 4.8 oz (88.1 kg) (10/10 0411)  Hemodynamic parameters for last 24 hours:    Intake/Output from previous day: 10/09 0701 - 10/10 0700 In: 2081.4 [P.O.:720; I.V.:1361.4] Out: 1525 [Urine:1525] Intake/Output this shift: No intake/output data recorded.  General appearance: alert, cooperative and no distress Heart: regular rate and rhythm Lungs: mildly dim in bases Abdomen: benign Extremities: + BLE edema Wound: incis healing well  Lab Results:  Recent Labs  05/17/17 0447 05/18/17 0418  WBC 11.8* 12.5*  HGB 8.9* 9.0*  HCT 27.2* 27.2*  PLT 223 262   BMET:  Recent Labs  05/18/17 0418  NA 138  K 4.1  CL 103  CO2 24  GLUCOSE 129*  BUN 16  CREATININE 1.09  CALCIUM 8.2*    PT/INR:  Recent Labs  05/18/17 0418  LABPROT 19.4*  INR 1.65   ABG    Component Value Date/Time   PHART 7.484 (H) 05/11/2017 1058   HCO3 23.3 05/11/2017 1058   TCO2 24 05/11/2017 1058   ACIDBASEDEF 10.0 (H) 05/08/2017 1232   O2SAT 97.0 05/11/2017 1058   CBG (last 3)   Recent Labs  05/16/17 1227    GLUCAP 122*    Meds Scheduled Meds: . amiodarone  400 mg Oral BID  . amLODipine  10 mg Oral Daily  . aspirin EC  81 mg Oral Daily  . Chlorhexidine Gluconate Cloth  6 each Topical Daily  . citalopram  20 mg Oral Daily  . docusate sodium  200 mg Oral Daily  . hydrocortisone cream   Topical TID  . lisinopril  20 mg Oral BID  . mouth rinse  15 mL Mouth Rinse BID  . metoprolol tartrate  25 mg Oral BID  . pantoprazole  40 mg Oral QAC breakfast  . potassium chloride  40 mEq Oral Daily  . simvastatin  20 mg Oral QHS  . sodium chloride flush  10-40 mL Intracatheter Q12H  . sodium chloride flush  3 mL Intravenous Q12H  . sodium chloride flush  3 mL Intravenous Q12H  . Warfarin - Pharmacist Dosing Inpatient   Does not apply q1800   Continuous Infusions: . sodium chloride Stopped (05/11/17 1500)  . sodium chloride    . sodium chloride 10 mL/hr at 05/17/17 1900  . sodium chloride 250 mL (05/14/17 2000)  . bivalirudin (ANGIOMAX) infusion 0.5 mg/mL (Non-ACS indications) 0.15 mg/kg/hr (05/18/17 0600)  . lactated ringers Stopped (05/09/17 2231)  . lactated ringers Stopped (05/09/17 2231)   PRN Meds:.sodium chloride, sodium chloride, acetaminophen, bisacodyl **OR** bisacodyl, guaiFENesin, lactated ringers, magnesium hydroxide, metoprolol tartrate, midazolam,  ondansetron **OR** ondansetron (ZOFRAN) IV, oxyCODONE, sodium chloride flush, sodium chloride flush, sodium chloride flush, traMADol  Xrays No results found.  Assessment/Plan: S/P Procedure(s) (LRB): REPLACEMENT OF ASCENDING AORTIC ARCH (N/A) INTRAOPERATIVE TRANSESOPHAGEAL ECHOCARDIOGRAM (N/A) AXILLARY CANNULATION (Right) HYPOTHERMIC CIRCULATORY ARREST (N/A) REPLACEMENT ASCENDING AORTA (N/A) STENT GRAFTING OF DESCENDING THORACIC AORTA STENT OF LEFT SUBCLAVIAN ARTERY with 10x39VBX DEBRANCHING OF LEFT CAROTID ARTERY AND INNOMINATE ARTERY   1 conts to overall do well  2 cont amiodarone PO for afib. BP control is probably reasonable  in 76 year old with this degree of Arterial disease 3 INR= yesterday at 1.65, pharmacy dosing coumadin/ conts bivalirudin( HIT pending result) 4 add lasix with p edema, small effusions, normal creat 5 slight increase in leukocytosis, T max 99- monitor closely 6 cont pulm toilet /rehab  LOS: 10 days    GOLD,WAYNE E 05/18/2017  Coumadin 1.65 inr if elevated more tomorrow stop angio max  I have seen and examined Don Medina and agree with the above assessment  and plan.  [name] MD Beeper [phone] Office [phone] 05/18/2017 2:58 PM

## 2017-05-18 NOTE — Progress Notes (Signed)
ANTICOAGULATION CONSULT NOTE - Follow Up Consult  Pharmacy Consult for bivalirudin Indication: DVT w/ possible HIT  Labs:  Recent Labs (last 2 labs)    Recent Labs  05/14/17 0408 05/15/17 0340  05/16/17 0021 05/16/17 0152 05/16/17 0413  HGB 9.6* 9.1*  --   --   --  8.9*  HCT 28.6* 27.4*  --   --   --  26.6*  PLT 140* 160  --   --   --  192  APTT 35 35  < > 107* 81* 48*  LABPROT 14.4  --   --   --   --  17.1*  INR 1.13  --   --   --   --  1.40  CREATININE 1.03 1.00  --   --   --   --   < > = values in this interval not displayed.     Assessment/Plan:  76yo male with aortic dissection s/p stent grafting (9/30). He has DVT in left internal jugular vein and on bivalirudin for r/o HIT. Heparin induced platelet antibody is still in process-confirmed with send out lab center that should have result by this afternoon/evening.  Last aPTT was within therapeutic range. aPTT today came back again within goal range at 67 (goal 60-80). INR remained the same today at 1.65 following increased dose of 4 mg last night. On 4th day of overlap with bivalirudin. Started on amiodarone yesterday, which will have a delayed impact on INR results. CBC is stable. No issues with the infusion noted by nursing. No signs/symptoms of bleeding.     Due to potentially falsely elevating INR while on bivalirudin, it is recommended to overlap for at least 5 days, until INR > or equal to 3, and INR remains therapeutic after INR check with bivalirudin stopped. Could consider the use of fondaparinux instead, given CrCl>30 mL/min to aid in bridging if considering discharge.   Plan: 1) Continue bivalirudin at a rate of 0.15 mg/kg/hr using weight of 86.7 kg 2) Warfarin 5 mg once tonight 3) Monitor daily CBC, aPTT,  INR, and s/sx of bleeding 4) Follow up on heparin induced platelet antibody lab  Doylene Canard, PharmD Clinical Pharmacist  Phone: [phone] 05/18/2017 7:54 AM

## 2017-05-19 LAB — CBC
HCT: 27.5 % — ABNORMAL LOW (ref 39.0–52.0)
Hemoglobin: 9.2 g/dL — ABNORMAL LOW (ref 13.0–17.0)
MCH: 31.2 pg (ref 26.0–34.0)
MCHC: 33.5 g/dL (ref 30.0–36.0)
MCV: 93.2 fL (ref 78.0–100.0)
Platelets: 304 10*3/uL (ref 150–400)
RBC: 2.95 MIL/uL — ABNORMAL LOW (ref 4.22–5.81)
RDW: 15.6 % — ABNORMAL HIGH (ref 11.5–15.5)
WBC: 11.5 10*3/uL — ABNORMAL HIGH (ref 4.0–10.5)

## 2017-05-19 LAB — PROTIME-INR
INR: 1.76
INR: 1.4
Prothrombin Time: 20.4 seconds — ABNORMAL HIGH (ref 11.4–15.2)
Prothrombin Time: 17.1 seconds — ABNORMAL HIGH (ref 11.4–15.2)

## 2017-05-19 LAB — APTT: aPTT: 63 seconds — ABNORMAL HIGH (ref 24–36)

## 2017-05-19 MED ORDER — METOPROLOL TARTRATE 50 MG PO TABS
50.0000 mg | ORAL_TABLET | Freq: Two times a day (BID) | ORAL | Status: DC
  Administered 2017-05-19 – 2017-05-20 (×2): 50 mg via ORAL
  Filled 2017-05-19 (×2): qty 1

## 2017-05-19 MED ORDER — WARFARIN SODIUM 5 MG PO TABS
5.0000 mg | ORAL_TABLET | Freq: Once | ORAL | Status: AC
  Administered 2017-05-19: 5 mg via ORAL
  Filled 2017-05-19: qty 1

## 2017-05-19 MED ORDER — WARFARIN SODIUM 5 MG PO TABS: 5.0000 mg | ORAL_TABLET | Freq: Once | ORAL | Status: DC

## 2017-05-19 MED ORDER — BIVALIRUDIN TRIFLUOROACETATE 250 MG IV SOLR
0.1600 mg/kg/h | INTRAVENOUS | Status: DC
  Administered 2017-05-19: 0.16 mg/kg/h via INTRAVENOUS
  Filled 2017-05-19: qty 250

## 2017-05-19 NOTE — Progress Notes (Signed)
ANTICOAGULATION CONSULT NOTE - Follow Up Consult  Pharmacy Consult for bivalirudin Indication: DVT w/ possible HIT  Labs:  Recent Labs (last 2 labs)    Recent Labs  05/14/17 0408 05/15/17 0340  05/16/17 0021 05/16/17 0152 05/16/17 0413  HGB 9.6* 9.1*  --   --   --  8.9*  HCT 28.6* 27.4*  --   --   --  26.6*  PLT 140* 160  --   --   --  192  APTT 35 35  < > 107* 81* 48*  LABPROT 14.4  --   --   --   --  17.1*  INR 1.13  --   --   --   --  1.40  CREATININE 1.03 1.00  --   --   --   --   < > = values in this interval not displayed.     Assessment/Plan:  76yo male with aortic dissection s/p stent grafting (9/30). He has DVT in left internal jugular vein and on bivalirudin for r/o HIT. 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  He is now s/p pacing wire removal and I spoke with Dr. Servando Snare: okay to restart bivalirudin.  -INR= 1.4 (off of bivalirudin)  Goal of Therapy:  aPTT 60-80 seconds Monitor platelets by anticoagulation protocol: Yes  Plan: -Restart bivalirudin at 0.16 mg/kg/hr  -aPTT in 2 hours and daily -Follow up on [place], Pharm D 05/19/2017 7:57 PM

## 2017-05-19 NOTE — Care Management Important Message (Signed)
Important Message  Patient Details  Name: Don Medina MRN: [id number] Date of Birth: 04-23-1941   Medicare Important Message Given:  Yes    Nathen May 05/19/2017, 9:35 AM

## 2017-05-19 NOTE — Progress Notes (Addendum)
[address]       [place] [address]             [phone]      11 Days Post-Op Procedure(s) (LRB): REPLACEMENT OF ASCENDING AORTIC ARCH (N/A) INTRAOPERATIVE TRANSESOPHAGEAL ECHOCARDIOGRAM (N/A) AXILLARY CANNULATION (Right) HYPOTHERMIC CIRCULATORY ARREST (N/A) REPLACEMENT ASCENDING AORTA (N/A) STENT GRAFTING OF DESCENDING THORACIC AORTA STENT OF LEFT SUBCLAVIAN ARTERY with 10x39VBX DEBRANCHING OF LEFT CAROTID ARTERY AND INNOMINATE ARTERY Subjective: conts to feel pretty well. HIT positive  Objective: Vital signs in last 24 hours: Temp:  [98.2 F (36.8 C)-99.2 F (37.3 C)] 99.2 F (37.3 C) (10/11 0510) Pulse Rate:  [66-97] 80 (10/11 0010) Cardiac Rhythm: Normal sinus rhythm (10/10 2228) Resp:  [16-27] 16 (10/11 0510) BP: (126-149)/(43-78) 137/77 (10/11 0510) SpO2:  [93 %-99 %] 93 % (10/11 0510) Weight:  [191 lb 5.8 oz (86.8 kg)] 191 lb 5.8 oz (86.8 kg) (10/11 0510)  Hemodynamic parameters for last 24 hours:    Intake/Output from previous day: 10/10 0701 - 10/11 0700 In: 340 [P.O.:340] Out: 1225 [Urine:1225] Intake/Output this shift: No intake/output data recorded.  General appearance: alert, cooperative and no distress Heart: regular rate and rhythm and occ. ectopic Lungs: dim left base Abdomen: benign Extremities: + LE edema Wound: incis healing well  Lab Results:  Recent Labs  05/18/17 0418 05/19/17 0416  WBC 12.5* 11.5*  HGB 9.0* 9.2*  HCT 27.2* 27.5*  PLT 262 304   BMET:  Recent Labs  05/18/17 0418  NA 138  K 4.1  CL 103  CO2 24  GLUCOSE 129*  BUN 16  CREATININE 1.09  CALCIUM 8.2*    PT/INR:  Recent Labs  05/19/17 0416  LABPROT 20.4*  INR 1.76   ABG    Component Value Date/Time   PHART 7.484 (H) 05/11/2017 1058   HCO3 23.3 05/11/2017 1058   TCO2 24 05/11/2017 1058   ACIDBASEDEF 10.0 (H) 05/08/2017 1232   O2SAT 97.0 05/11/2017 1058   CBG (last 3)   Recent Labs  05/16/17 1227  GLUCAP 122*     Meds Scheduled Meds: . amiodarone  400 mg Oral BID  . amLODipine  10 mg Oral Daily  . aspirin EC  81 mg Oral Daily  . Chlorhexidine Gluconate Cloth  6 each Topical Daily  . citalopram  20 mg Oral Daily  . docusate sodium  200 mg Oral Daily  . furosemide  40 mg Oral Daily  . hydrocortisone cream   Topical TID  . lisinopril  20 mg Oral BID  . mouth rinse  15 mL Mouth Rinse BID  . metoprolol tartrate  25 mg Oral BID  . pantoprazole  40 mg Oral QAC breakfast  . potassium chloride  20 mEq Oral Daily  . simvastatin  20 mg Oral QHS  . sodium chloride flush  10-40 mL Intracatheter Q12H  . sodium chloride flush  3 mL Intravenous Q12H  . sodium chloride flush  3 mL Intravenous Q12H  . Warfarin - Pharmacist Dosing Inpatient   Does not apply q1800   Continuous Infusions: . sodium chloride Stopped (05/11/17 1500)  . sodium chloride    . sodium chloride 10 mL/hr at 05/17/17 1900  . sodium chloride 250 mL (05/14/17 2000)  . bivalirudin (ANGIOMAX) infusion 0.5 mg/mL (Non-ACS indications) 0.15 mg/kg/hr (05/19/17 0707)  . lactated ringers Stopped (05/09/17 2231)  . lactated ringers Stopped (05/09/17 2231)   PRN Meds:.sodium chloride, sodium chloride, acetaminophen, bisacodyl **OR** bisacodyl, guaiFENesin, lactated  ringers, magnesium hydroxide, metoprolol tartrate, midazolam, ondansetron **OR** ondansetron (ZOFRAN) IV, oxyCODONE, sodium chloride flush, sodium chloride flush, sodium chloride flush, traMADol  Xrays Dg Chest 2 View  Result Date: 05/18/2017 CLINICAL DATA:  Postop aortic stent graft placement. EXAM: CHEST  2 VIEW COMPARISON:  05/14/2017 FINDINGS: A right PICC terminates over the lower SVC, unchanged. Sequelae of sternotomy and aortic stent graft placement are again identified. The cardiomediastinal silhouette is unchanged. There are persistent small bilateral pleural effusions with asymmetric left basilar opacity likely reflecting atelectasis, not significantly changed. There is no  overt edema or pneumothorax. A compression fracture is again noted at the thoracolumbar junction. IMPRESSION: Unchanged small pleural effusions and left basilar atelectasis. Electronically Signed   By: Logan Bores M.D.   On: 05/18/2017 08:13    Assessment/Plan: S/P Procedure(s) (LRB): REPLACEMENT OF ASCENDING AORTIC ARCH (N/A) INTRAOPERATIVE TRANSESOPHAGEAL ECHOCARDIOGRAM (N/A) AXILLARY CANNULATION (Right) HYPOTHERMIC CIRCULATORY ARREST (N/A) REPLACEMENT ASCENDING AORTA (N/A) STENT GRAFTING OF DESCENDING THORACIC AORTA STENT OF LEFT SUBCLAVIAN ARTERY with 10x39VBX DEBRANCHING OF LEFT CAROTID ARTERY AND INNOMINATE ARTERY  1 steady improvement 2 INR 1.7- will probably d/c pacer wires when Bivalirudin off , PTT 63 3 HIT positive- will need to determine timing of d/c bivalirudin, platelets 304K 4 PAC's, cont amiodarone 5 H/H is stable 6 cont diuresis  LOS: 11 days    GOLD,WAYNE E 05/19/2017  D/ bivalrudin today to get pacing wires out  Continue coumadin  Poss home tomorrow  I have seen and examined [name] and agree with the above assessment  and plan.  [name] MD Beeper [phone] Office [phone] 05/19/2017 9:24 AM

## 2017-05-19 NOTE — Progress Notes (Signed)
CARDIAC REHAB PHASE I   PRE:  Rate/Rhythm: 83 SR with PACs  BP:  Supine:   Sitting: 145/74  Standing:    SaO2: 95%RA  MODE:  Ambulation: 800 ft   POST:  Rate/Rhythm: 101 ST PACs  BP:  Supine:   Sitting: 154/58  Standing:    SaO2: 95%RA 1053-1120 Pt walked 800 ft with hand held asst with steady gait. Stopped once to rest. Tolerated well. To recliner after walk.    Graylon Good, RN BSN  05/19/2017 11:18 AM

## 2017-05-19 NOTE — Progress Notes (Signed)
Progress Note  Patient Name: Don Medina Date of Encounter: 05/19/2017  Primary Cardiologist: Dr. Johnsie Cancel (new, previously saw Dr. Lovena Le in 2012)  Subjective   Feeling well. Ready to go home.  Denies palpitations, chest pain or shortness of breath.    Inpatient Medications    Scheduled Meds: . amiodarone  400 mg Oral BID  . amLODipine  10 mg Oral Daily  . aspirin EC  81 mg Oral Daily  . Chlorhexidine Gluconate Cloth  6 each Topical Daily  . citalopram  20 mg Oral Daily  . docusate sodium  200 mg Oral Daily  . furosemide  40 mg Oral Daily  . hydrocortisone cream   Topical TID  . lisinopril  20 mg Oral BID  . mouth rinse  15 mL Mouth Rinse BID  . metoprolol tartrate  25 mg Oral BID  . pantoprazole  40 mg Oral QAC breakfast  . potassium chloride  20 mEq Oral Daily  . simvastatin  20 mg Oral QHS  . sodium chloride flush  10-40 mL Intracatheter Q12H  . sodium chloride flush  3 mL Intravenous Q12H  . sodium chloride flush  3 mL Intravenous Q12H  . Warfarin - Pharmacist Dosing Inpatient   Does not apply q1800   Continuous Infusions: . sodium chloride Stopped (05/11/17 1500)  . sodium chloride    . sodium chloride 10 mL/hr at 05/17/17 1900  . sodium chloride 250 mL (05/14/17 2000)  . lactated ringers Stopped (05/09/17 2231)  . lactated ringers Stopped (05/09/17 2231)   PRN Meds: sodium chloride, sodium chloride, acetaminophen, bisacodyl **OR** bisacodyl, guaiFENesin, lactated ringers, magnesium hydroxide, metoprolol tartrate, midazolam, ondansetron **OR** ondansetron (ZOFRAN) IV, oxyCODONE, sodium chloride flush, sodium chloride flush, sodium chloride flush, traMADol   Vital Signs    Vitals:   05/19/17 0007 05/19/17 0010 05/19/17 0510 05/19/17 1039  BP:  126/77 137/77 (!) 178/71  Pulse:  80  84  Resp:  18 16 (!) 21  Temp: 98.2 F (36.8 C) 98.5 F (36.9 C) 99.2 F (37.3 C) 98.3 F (36.8 C)  TempSrc: Oral Oral Oral Oral  SpO2: 99% 94% 93% 94%  Weight:   86.8  kg (191 lb 5.8 oz)   Height:        Intake/Output Summary (Last 24 hours) at 05/19/17 1201 Last data filed at 05/19/17 0009  Gross per 24 hour  Intake              222 ml  Output             1125 ml  Net             -903 ml   Filed Weights   05/17/17 0429 05/18/17 0411 05/19/17 0510  Weight: 87.2 kg (192 lb 4.8 oz) 88.1 kg (194 lb 4.8 oz) 86.8 kg (191 lb 5.8 oz)    Telemetry    Sinus rhythm. Frequent PACs.  Personally Reviewed  ECG    N/a    Physical Exam   VS:  BP (!) 178/71 (BP Location: Right Leg)   Pulse 84   Temp 98.3 F (36.8 C) (Oral)   Resp (!) 21   Ht 5\' 8"  (1.727 m)   Wt 86.8 kg (191 lb 5.8 oz)   SpO2 94%   BMI 29.10 kg/m  , BMI Body mass index is 29.1 kg/m. GENERAL:  Well appearing HEENT: Pupils equal round and reactive, fundi not visualized, oral mucosa unremarkable NECK:  No jugular venous distention, waveform within  normal limits, carotid upstroke brisk and symmetric, no bruits, no thyromegaly CHEST: Healing midline incision LUNGS:  Clear to auscultation bilaterally HEART:  Mostly regular with occasional ectopy.   PMI not displaced or sustained,S1 and S2 within normal limits, no S3, no S4, no clicks, no rubs, II/Vi systolic murmur at the LUSB.  ABD:  Flat, positive bowel sounds normal in frequency in pitch, no bruits, no rebound, no guarding, no midline pulsatile mass, no hepatomegaly, no splenomegaly EXT:  2 plus pulses throughout, R UE edema, No LE edema.  No cyanosis no clubbing SKIN:  No rashes no nodules NEURO:  Cranial nerves II through XII grossly intact, motor grossly intact throughout [facility]:  Cognitively intact, oriented to person place and time   Labs    Chemistry  Recent Labs Lab 05/14/17 0408 05/15/17 0340 05/18/17 0418  NA 139 139 138  K 3.0* 3.5 4.1  CL 105 104 103  CO2 25 27 24   GLUCOSE 125* 127* 129*  BUN 17 16 16   CREATININE 1.03 1.00 1.09  CALCIUM 7.9* 7.9* 8.2*  PROT  --   --  5.5*  ALBUMIN  --   --  2.7*  AST  --    --  25  ALT  --   --  28  ALKPHOS  --   --  54  BILITOT  --   --  1.1  GFRNONAA >60 >60 >60  GFRAA >60 >60 >60  ANIONGAP 9 8 11      Hematology  Recent Labs Lab 05/17/17 0447 05/18/17 0418 05/19/17 0416  WBC 11.8* 12.5* 11.5*  RBC 2.91* 2.93* 2.95*  HGB 8.9* 9.0* 9.2*  HCT 27.2* 27.2* 27.5*  MCV 93.5 92.8 93.2  MCH 30.6 30.7 31.2  MCHC 32.7 33.1 33.5  RDW 15.6* 15.4 15.6*  PLT 223 262 304    Cardiac EnzymesNo results for input(s): TROPONINI in the last 168 hours. No results for input(s): TROPIPOC in the last 168 hours.   BNPNo results for input(s): BNP, PROBNP in the last 168 hours.   DDimer No results for input(s): DDIMER in the last 168 hours.   Radiology    Dg Chest 2 View  Result Date: 05/18/2017 CLINICAL DATA:  Postop aortic stent graft placement. EXAM: CHEST  2 VIEW COMPARISON:  05/14/2017 FINDINGS: A right PICC terminates over the lower SVC, unchanged. Sequelae of sternotomy and aortic stent graft placement are again identified. The cardiomediastinal silhouette is unchanged. There are persistent small bilateral pleural effusions with asymmetric left basilar opacity likely reflecting atelectasis, not significantly changed. There is no overt edema or pneumothorax. A compression fracture is again noted at the thoracolumbar junction. IMPRESSION: Unchanged small pleural effusions and left basilar atelectasis. Electronically Signed   By: Logan Bores M.D.   On: 05/18/2017 08:13    Cardiac Studies   TEE 05/08/17:   Left ventricle: Normal cavity size and wall thickness. LV systolic function is normal with an EF of 55-60%. There are no obvious wall motion abnormalities.  Aortic valve: The valve is trileaflet. Severe regurgitation.  Aorta: The ascending aorta is dilated. 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  Right ventricle: Cavity is moderately to severely dilated. Moderately reduced systolic function.  Right atrium: Cavity  is dilated.  Aorta: Graft present in the ascending aorta.   Patient Profile     76 y.o. male here with an acute type A dissection now s/p replacement of the ascending aortic arch, stent grafting the descending  thoracic aorta, stenting L subclavian, and repair of the aortic valve.   Assessment & Plan    # Type A dissection: Mr. Fosnaugh is doing very well after repair of the dissection with replacement of the ascending aortic arch, ascending aorta using a 28x30 Hemashield Platinum Graft, stent grafting of the descending thoracic aorta, stenting of the descending thoracic aorta, stenting of the left subclavian, debraching of the left carotid artery, and repair of the aortic valve with resuspension.   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  INR slowly increasing to 1.76 today.  Goal 2-3.  HIT panel was postive.  # Post-op atrial fibrillation:  Mr. Wickham developed paroxysmal atrial fibrillation on 10/8.  He is maintaining sinus rhythm with frequent PACs on amiodarone and metoprolol.  Will increase to 50mg  bid.   # Hypertension: BP remains above goal.  Will increase metoprolol to 50mg  bid.  Could switch to carvedilol it BP remains elevated.  Amlodipine was increased 10/7.  Continue lisinopril.   We will sign off.  We will be happy to manage his INR as an outpatient.  Please call with questions.    For questions or updates, please contact [place] Please consult [email or web address] for contact info under Cardiology/STEMI.      Signed, Skeet Latch, MD  05/19/2017, 12:01 PM

## 2017-05-19 NOTE — Progress Notes (Signed)
Pacing wires removed per order. Patient instructed on bedrest and VS initiated.

## 2017-05-19 NOTE — Progress Notes (Signed)
ANTICOAGULATION CONSULT NOTE - Follow Up Consult  Pharmacy Consult for bivalirudin Indication: DVT w/ possible HIT  Labs:  Recent Labs (last 2 labs)    Recent Labs  05/14/17 0408 05/15/17 0340  05/16/17 0021 05/16/17 0152 05/16/17 0413  HGB 9.6* 9.1*  --   --   --  8.9*  HCT 28.6* 27.4*  --   --   --  26.6*  PLT 140* 160  --   --   --  192  APTT 35 35  < > 107* 81* 48*  LABPROT 14.4  --   --   --   --  17.1*  INR 1.13  --   --   --   --  1.40  CREATININE 1.03 1.00  --   --   --   --   < > = values in this interval not displayed.     Assessment/Plan:  76yo male with aortic dissection s/p stent grafting (9/30). He has DVT in left internal jugular vein and on bivalirudin for r/o HIT. 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  aPTTs have been within therapeutic range; however, trending downward slowly (goal 60-80). Daily aPTT today came back at 63. INR changed from 1.65 to 1.76 following 5 mg dose last night. On 5th day of overlap with bivalirudin. Started on amiodarone two days ago, which will have a delayed impact on INR results. CBC remains stable. No issues with the infusion noted by nursing. No signs/symptoms of bleeding.     Due to potentially falsely elevating INR while on bivalirudin, it is recommended to overlap for at least 5 days, until INR > or equal to 3, and INR remains therapeutic after INR check with bivalirudin stopped. Could consider the use of fondaparinux instead, given CrCl>30 mL/min to aid in bridging if considering discharge.   Plan: 1) Increase bivalirudin to a rate of 0.16 mg/kg/hr using weight of 86.7 kg 2) Warfarin 5 mg once tonight 3) Obtain repeat aPTT 2 hours after rate adjustment to confirm within goal range 3) Monitor daily CBC, aPTT,  INR, and s/sx of bleeding 4) Follow up on SRA lab   Doylene Canard, PharmD Clinical Pharmacist  Phone: [phone] 05/19/2017 7:58 AM

## 2017-05-20 LAB — CBC
HCT: 27.8 % — ABNORMAL LOW (ref 39.0–52.0)
Hemoglobin: 9.3 g/dL — ABNORMAL LOW (ref 13.0–17.0)
MCH: 31.2 pg (ref 26.0–34.0)
MCHC: 33.5 g/dL (ref 30.0–36.0)
MCV: 93.3 fL (ref 78.0–100.0)
Platelets: 332 10*3/uL (ref 150–400)
RBC: 2.98 MIL/uL — ABNORMAL LOW (ref 4.22–5.81)
RDW: 15.8 % — ABNORMAL HIGH (ref 11.5–15.5)
WBC: 10.1 10*3/uL (ref 4.0–10.5)

## 2017-05-20 LAB — SEROTONIN RELEASE ASSAY (SRA)
SRA .2 IU/mL UFH Ser-aCnc: 110 % — ABNORMAL HIGH (ref 0–20)
SRA 100IU/mL UFH Ser-aCnc: 1 % (ref 0–20)

## 2017-05-20 LAB — PROTIME-INR
INR: 2.14
Prothrombin Time: 23.8 seconds — ABNORMAL HIGH (ref 11.4–15.2)

## 2017-05-20 LAB — APTT
aPTT: 73 seconds — ABNORMAL HIGH (ref 24–36)
aPTT: 69 seconds — ABNORMAL HIGH (ref 24–36)

## 2017-05-20 MED ORDER — METOPROLOL TARTRATE 50 MG PO TABS: 50.0000 mg | ORAL_TABLET | Freq: Two times a day (BID) | ORAL | 1 refills | Status: DC

## 2017-05-20 MED ORDER — TRAMADOL HCL 50 MG PO TABS: ORAL_TABLET | ORAL | 0 refills | Status: DC

## 2017-05-20 MED ORDER — POTASSIUM CHLORIDE CRYS ER 20 MEQ PO TBCR: 20.0000 meq | EXTENDED_RELEASE_TABLET | Freq: Every day | ORAL | 0 refills | Status: DC

## 2017-05-20 MED ORDER — AMLODIPINE BESYLATE 10 MG PO TABS: 10.0000 mg | ORAL_TABLET | Freq: Every day | ORAL | 1 refills | Status: DC

## 2017-05-20 MED ORDER — FUROSEMIDE 20 MG PO TABS: 20.0000 mg | ORAL_TABLET | Freq: Every day | ORAL | 0 refills | Status: DC

## 2017-05-20 MED ORDER — WARFARIN SODIUM 5 MG PO TABS
5.0000 mg | ORAL_TABLET | Freq: Once | ORAL | Status: AC
  Administered 2017-05-20: 5 mg via ORAL
  Filled 2017-05-20: qty 1

## 2017-05-20 MED ORDER — WARFARIN SODIUM 7.5 MG PO TABS: 7.5000 mg | ORAL_TABLET | Freq: Once | ORAL | Status: DC

## 2017-05-20 MED ORDER — WARFARIN SODIUM 2.5 MG PO TABS: 2.5000 mg | ORAL_TABLET | Freq: Every day | ORAL | 1 refills | Status: DC

## 2017-05-20 MED ORDER — AMIODARONE HCL 200 MG PO TABS: 200.0000 mg | ORAL_TABLET | Freq: Two times a day (BID) | ORAL | 1 refills | Status: DC

## 2017-05-20 MED ORDER — LISINOPRIL 20 MG PO TABS: 20.0000 mg | ORAL_TABLET | Freq: Two times a day (BID) | ORAL | 1 refills | Status: DC

## 2017-05-20 MED ORDER — ASPIRIN 81 MG PO TABS
81.0000 mg | ORAL_TABLET | Freq: Every day | ORAL | Status: DC
Start: 2017-05-20 — End: 2021-03-25

## 2017-05-20 MED ORDER — FUROSEMIDE 20 MG PO TABS
20.0000 mg | ORAL_TABLET | Freq: Every day | ORAL | 0 refills | Status: DC
Start: 2017-05-20 — End: 2017-05-20

## 2017-05-20 MED ORDER — ACETAMINOPHEN 325 MG PO TABS: 650.0000 mg | ORAL_TABLET | Freq: Four times a day (QID) | ORAL | Status: DC | PRN

## 2017-05-20 NOTE — Progress Notes (Addendum)
ANTICOAGULATION CONSULT NOTE - Follow Up Consult  Pharmacy Consult for Bivalirudin  Indication: DVT, likely HIT  Allergies  Allergen Reactions  . Heparin     Heparin antibody positive; SRA pending    Patient Measurements: Height: 5\' 8"  (172.7 cm) Weight: 191 lb 5.8 oz (86.8 kg) IBW/kg (Calculated) : 68.4  Vital Signs: Temp: 98.7 F (37.1 C) (10/11 2348) Temp Source: Oral (10/11 2348) BP: 137/71 (10/11 2352) Pulse Rate: 75 (10/11 1530)  Labs:  Recent Labs  05/17/17 0447 05/18/17 0418 05/19/17 0416 05/19/17 1815 05/20/17 0054  HGB 8.9* 9.0* 9.2*  --   --   HCT 27.2* 27.2* 27.5*  --   --   PLT 223 262 304  --   --   APTT 73* 67* 63*  --  73*  LABPROT 19.3* 19.4* 20.4* 17.1*  --   INR 1.65 1.65 1.76 1.40  --   CREATININE  --  1.09  --   --   --     Estimated Creatinine Clearance: 62.8 mL/min (by C-G formula based on SCr of 1.09 mg/dL).   Assessment 76yo male with aortic dissection s/p stent grafting (9/30). He has DVT in left internal jugular vein and on bivalirudin for r/o HIT. 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  APTT is therapeutic at 73 after re-start earlier on 10/11  Goal of Therapy:  aPTT 60-80 seconds Monitor platelets by anticoagulation protocol: Yes   Plan:  -Cont bivalirudin at 0.16 mg/kg/kr -0500 aPTT  Narda Bonds 05/20/2017,1:57 AM   =============================== 05/20/2017 5:31 AM Confirmatory aPTT this AM is therapeutic at 69 -Continue bivalirudin at 0.16 mg/kg/hr -Daily aPTT Narda Bonds, PharmD, BCPS Clinical Pharmacist Phone: [phone] ================================

## 2017-05-20 NOTE — Progress Notes (Addendum)
      [address]       [place],[address] [address]             [phone]        12 Days Post-Op Procedure(s) (LRB): REPLACEMENT OF ASCENDING AORTIC ARCH (N/A) INTRAOPERATIVE TRANSESOPHAGEAL ECHOCARDIOGRAM (N/A) AXILLARY CANNULATION (Right) HYPOTHERMIC CIRCULATORY ARREST (N/A) REPLACEMENT ASCENDING AORTA (N/A) STENT GRAFTING OF DESCENDING THORACIC AORTA STENT OF LEFT SUBCLAVIAN ARTERY with [id number] DEBRANCHING OF LEFT CAROTID ARTERY AND INNOMINATE ARTERY  Subjective: Patient without complaints this am. He hopes to go home.  Objective: Vital signs in last 24 hours: Temp:  [98.3 F (36.8 C)-98.9 F (37.2 C)] 98.8 F (37.1 C) (10/12 0427) Pulse Rate:  [75-84] 75 (10/11 1530) Cardiac Rhythm: Normal sinus rhythm (10/11 2000) Resp:  [15-27] 18 (10/12 0427) BP: (124-178)/(62-79) 140/62 (10/12 0427) SpO2:  [91 %-94 %] 91 % (10/11 1530) Weight:  [188 lb 8 oz (85.5 kg)] 188 lb 8 oz (85.5 kg) (10/12 0700)  Pre op weight 79.8 kg Current Weight  05/20/17 188 lb 8 oz (85.5 kg)      Intake/Output from previous day: 10/11 0701 - 10/12 0700 In: 1767 [P.O.:480; I.V.:1287] Out: 2250 [Urine:2250]   Physical Exam:  Cardiovascular: RRR, no murmur Pulmonary: Clear to auscultation bilaterally Abdomen: Soft, non tender, bowel sounds present. Extremities: Mild bilateral lower extremity edema. LUE with decreasing swelling. Wounds: Sternal dressing inferiorly removed. He had old blood on dressing but no active drainage this am.  No erythema or signs of infection.   Lab Results: CBC:  Recent Labs  05/19/17 0416 05/20/17 0422  WBC 11.5* 10.1  HGB 9.2* 9.3*  HCT 27.5* 27.8*  PLT 304 332   BMET:   Recent Labs  05/18/17 0418  NA 138  K 4.1  CL 103  CO2 24  GLUCOSE 129*  BUN 16  CREATININE 1.09  CALCIUM 8.2*    PT/INR:  Lab Results  Component Value Date   INR 2.14 05/20/2017   INR 1.40 05/19/2017   INR 1.76 05/19/2017   ABG:  INR: Will add last  result for INR, ABG once components are confirmed Will add last 4 CBG results once components are confirmed  Assessment/Plan:  1. CV - Previous PACs. On Bivalirudin this am,Amiodarone 400 mg bid, Amlodipine 10 mg daily, Lisinopril 20 mg bid, Lopressor 50 mg bid, and Coumadin. INR increased from 1.4 to 2.14. Will likely continue with 2.5 mg at discharge.  2.  Pulmonary - On room air.  Encourage incentive spirometer. 3. Volume Overload - On Lasix 40 mg daily 4.  Acute blood loss anemia - H and H stable at 9.3 and 27.8 potassium 5. LUE DVT, HIT-Bivalirudin stopped yesterday and EPW removed and then it was restarted. 6. Remove PICC line. 7. Will discuss disposition with Dr. Suzanne Boron MPA-C 05/20/2017,7:11 AM  Plan d/c home today on coumadin , inr check Monday  I have seen and examined Don Medina and agree with the above assessment  and plan.  Don Isaac MD [place] [phone] Office [phone] 05/20/2017 11:39 AM

## 2017-05-20 NOTE — Progress Notes (Addendum)
  ADDENDUM Spoke with Lars Pinks (Dr. Servando Snare in the room next to her as well)- patient to be discharged today. To give warfarin 5mg  x1 before patient leaves hospital, and then to discharge on 2.5mg  daily per CVTS request.  Order entered for warfarin 5mg  x1 to be given ~1100 this morning.  Kaylon Laroche D. Dabria Wadas, PharmD, BCPS Clinical Pharmacist 05/20/2017 9:48 AM    ANTICOAGULATION CONSULT NOTE - Follow Up [place] for Bivalirudin + warfarin Indication: DVT, likely HIT  Allergies  Allergen Reactions  . Heparin     Heparin antibody positive; SRA pending    Patient Measurements: Height: 5\' 8"  (172.7 cm) Weight: 188 lb 8 oz (85.5 kg) IBW/kg (Calculated) : 68.4  Vital Signs: Temp: 98.8 F (37.1 C) (10/12 0427) Temp Source: Oral (10/12 0427) BP: 140/62 (10/12 0427)  Labs:  Recent Labs  05/18/17 0418 05/19/17 0416 05/19/17 1815 05/20/17 0054 05/20/17 0422  HGB 9.0* 9.2*  --   --  9.3*  HCT 27.2* 27.5*  --   --  27.8*  PLT 262 304  --   --  332  APTT 67* 63*  --  73* 69*  LABPROT 19.4* 20.4* 17.1*  --  23.8*  INR 1.65 1.76 1.40  --  2.14  CREATININE 1.09  --   --   --   --     Estimated Creatinine Clearance: 62.3 mL/min (by C-G formula based on SCr of 1.09 mg/dL).   Assessment 76yo male with aortic dissection s/p stent grafting (9/30). He has DVT in left internal jugular vein and on bivalirudin for r/o HIT. 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  Daily aPTT today came back early this morning at 69. INR is 2.14, however INR last evening when patient was not on bivalirudin was 1.4. Due to bivalirudin falsely elevating INR, it is recommended to overlap until INR is >/= 3, and INR remains therapeutic after INR check with bivalirudin stopped.  Patient has completed 5 days of overlap therapy.  Could consider the use of fondaparinux instead to aide in discharge given CrCl>30 mL/min.   Goal of Therapy:  INR 2-3  (off bivalirudin) aPTT 60-80 seconds Monitor platelets by anticoagulation protocol: Yes   Plan: -Continue bivalirudin at rate of 0.16 mg/kg/hr using weight of 86.7 kg -Warfarin 7.5mg  once tonight -Monitor daily CBC, aPTT,  INR, and s/sx of bleeding   -Follow up on SRA lab - sent 10/10, still in process  Alithea Lapage D. Kortez Murtagh, PharmD, [place] Clinical Pharmacist Pager: [phone] Clinical Phone for 05/20/2017 until 3:30pm: [phone] If after 3:30pm, please call main pharmacy at [phone] 05/20/2017 7:40 AM

## 2017-05-20 NOTE — Progress Notes (Signed)
Don Medina to be D/C'd Home per MD order. Discussed with the patient and all questions fully answered.  Allergies as of 05/20/2017      Reactions   Heparin    Heparin antibody positive; SRA pending      Medication List    STOP taking these medications   meloxicam 7.5 MG tablet Commonly known as:  MOBIC     TAKE these medications   acetaminophen 325 MG tablet Commonly known as:  TYLENOL Take 2 tablets (650 mg total) by mouth every 6 (six) hours as needed for mild pain.   amiodarone 200 MG tablet Commonly known as:  PACERONE Take 1 tablet (200 mg total) by mouth 2 (two) times daily. For one week then take Amiodarone 200 mg by mouth daily thereafter.   amLODipine 10 MG tablet Commonly known as:  NORVASC Take 1 tablet (10 mg total) by mouth daily.   aspirin 81 MG tablet Take 1 tablet (81 mg total) by mouth daily. What changed:  Another medication with the same name was removed. Continue taking this medication, and follow the directions you see here.   citalopram 20 MG tablet Commonly known as:  CELEXA TAKE ONE TABLET BY MOUTH ONCE DAILY What changed:  See the new instructions.   desonide 0.05 % cream Commonly known as:  DESOWEN Apply 1 application topically daily as needed (for facial flares).   Fish Oil 1200 MG Caps Take 1,200 mg by mouth 2 (two) times daily.   folic acid 1 MG tablet Commonly known as:  FOLVITE Take 1 mg by mouth daily.   furosemide 20 MG tablet Commonly known as:  LASIX Take 1 tablet (20 mg total) by mouth daily. For one week then stop.   GLUCOS-CHONDROIT-MSM COMPLEX Tabs Take 1 tablet by mouth 3 (three) times daily.   lisinopril 20 MG tablet Commonly known as:  PRINIVIL,ZESTRIL Take 1 tablet (20 mg total) by mouth 2 (two) times daily.   metoprolol tartrate 50 MG tablet Commonly known as:  LOPRESSOR Take 1 tablet (50 mg total) by mouth 2 (two) times daily.   potassium chloride SA 20 MEQ tablet Commonly known as:   K-DUR,KLOR-CON Take 1 tablet (20 mEq total) by mouth daily. For one week then stop.   simvastatin 20 MG tablet Commonly known as:  ZOCOR TAKE ONE TABLET BY MOUTH AT BEDTIME What changed:  See the new instructions.   traMADol 50 MG tablet Commonly known as:  ULTRAM Take 50 mg by mouth every 4-6 hours PRN severe pain.   warfarin 2.5 MG tablet Commonly known as:  COUMADIN Take 1 tablet (2.5 mg total) by mouth daily. Or as directed.       VVS, Skin clean, dry and intact without evidence of skin break down, no evidence of skin tears noted.  IV catheter discontinued intact. Site without signs and symptoms of complications. Dressing and pressure applied.   An After Visit Summary was printed and given to the patient.  Patient escorted via [place], and D/C home via private auto.  Don Medina  05/20/2017 11:38 AM

## 2017-05-20 NOTE — Progress Notes (Signed)
We will schedule INR follow up for Mr. Orrego early next week.   Luca Dyar C. Oval Linsey, MD, [facility]  05/20/2017  10:01 AM

## 2017-05-20 NOTE — Care Management Note (Signed)
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                 Action/Plan: NCM will follow for dc needs.   Expected Discharge Date:  05/20/17               Expected Discharge Plan:  Home/Self Care  In-House Referral:  NA  Discharge planning Services  CM Consult  Post Acute Care Choice:  NA Choice offered to:  NA  DME Arranged:    DME Agency:     HH Arranged:    HH Agency:     Status of Service:  Completed, signed off  If discussed at [employer] of Stay Meetings, dates discussed:  10/11  Discharge Disposition: home/self care   Additional Comments:  05/19/17- [place] RN, CM- pt for d/c home today- no CM needs noted for discharge.  Dahlia Client [place], RN 05/20/2017, 10:58 AM [phone]

## 2017-05-20 NOTE — Progress Notes (Signed)
CARDIAC REHAB PHASE I   Ed completed with pt, wife, and daughter. Voiced understanding. Discussed low sodium and vitamin K diets.  Will refer to [place].  [phone]  [place], ACSM 05/20/2017 10:56 AM

## 2017-05-23 ENCOUNTER — Telehealth: Payer: Self-pay | Admitting: *Deleted

## 2017-05-23 ENCOUNTER — Ambulatory Visit (INDEPENDENT_AMBULATORY_CARE_PROVIDER_SITE_OTHER): Payer: Medicare HMO | Admitting: Family Medicine

## 2017-05-23 ENCOUNTER — Encounter: Payer: Self-pay | Admitting: Family Medicine

## 2017-05-23 ENCOUNTER — Telehealth (HOSPITAL_COMMUNITY): Payer: Self-pay

## 2017-05-23 ENCOUNTER — Ambulatory Visit (INDEPENDENT_AMBULATORY_CARE_PROVIDER_SITE_OTHER): Payer: Medicare HMO | Admitting: *Deleted

## 2017-05-23 VITALS — BP 110/70 | HR 71 | Temp 98.5°F | Wt 180.2 lb

## 2017-05-23 DIAGNOSIS — Z9889 Other specified postprocedural states: Secondary | ICD-10-CM | POA: Diagnosis not present

## 2017-05-23 DIAGNOSIS — R350 Frequency of micturition: Secondary | ICD-10-CM | POA: Diagnosis not present

## 2017-05-23 DIAGNOSIS — K644 Residual hemorrhoidal skin tags: Secondary | ICD-10-CM | POA: Diagnosis not present

## 2017-05-23 DIAGNOSIS — I4891 Unspecified atrial fibrillation: Secondary | ICD-10-CM | POA: Insufficient documentation

## 2017-05-23 DIAGNOSIS — Z5181 Encounter for therapeutic drug level monitoring: Secondary | ICD-10-CM | POA: Diagnosis not present

## 2017-05-23 HISTORY — DX: Unspecified atrial fibrillation: I48.91

## 2017-05-23 LAB — POCT INR: INR: 1.7

## 2017-05-23 LAB — POCT URINALYSIS DIPSTICK
BILIRUBIN UA: NEGATIVE
GLUCOSE UA: NEGATIVE
Ketones, UA: NEGATIVE
Leukocytes, UA: NEGATIVE
Nitrite, UA: NEGATIVE
Protein, UA: NEGATIVE
RBC UA: NEGATIVE
SPEC GRAV UA: 1.01 (ref 1.010–1.025)
Urobilinogen, UA: 0.2 E.U./dL
pH, UA: 6 (ref 5.0–8.0)

## 2017-05-23 NOTE — Patient Instructions (Signed)

## 2017-05-23 NOTE — Telephone Encounter (Signed)
Verified insurance. $45 co-payment, no deductible, out of pocket amount is $4,500/$728.40 has been met, no co-insurance, and no pre-authorization is required. Passport/reference # [id number]

## 2017-05-23 NOTE — Telephone Encounter (Signed)
Transition Care Management Follow-up Telephone Call  Admit date: 05/07/2017 Discharge date: 05/20/2017   How have you been since you were released from the hospital? "I think I've got a UTI and my hemorrhoids are driving me nuts."   Do you understand why you were in the hospital? yes   Do you understand the discharge instructions? yes   Where were you discharged to? Home   Items Reviewed:  Medications reviewed: no  Allergies reviewed: yes  Dietary changes reviewed: yes  Referrals reviewed: yes, Dr. Johnsie Cancel (cardiology)   Functional Questionnaire:   Activities of Daily Living (ADLs):   He states they are independent in the following: ambulation, feeding, continence, grooming and toileting States they require assistance with the following: bathing and hygiene and dressing. Wife and daughter are helping.   Any transportation issues/concerns?: yes   Any patient concerns? yes, UTI + hemorrhoids   Confirmed importance and date/time of follow-up visits scheduled yes  Provider Appointment booked with Dr. Elease Hashimoto 05/23/17 @ 4:00pm  Confirmed with patient if condition begins to worsen call PCP or go to the ER.  Patient was given the office number and encouraged to call back with question or concerns.  : yes

## 2017-05-23 NOTE — Progress Notes (Signed)
Subjective:     Patient ID: Don Medina, male   DOB: 1940/12/24, 76 y.o.   MRN: [id number]  HPI Patient seen for hospital follow-up with concerns of some urine frequency past couple days and external hemorrhoids.  He was admitted on 9/29 with aortic dissection. He presented to ED with sudden onset of chest pain radiating to the jaw. This occurred when he was playing cards with some friends and family. He had pain radiating into the back and was concerned he was having a "MI". He took an aspirin and presented to the ED promptly for evaluation. He had CT scan which ruled out PE but there was evidence for aortic dissection. Repeat CT angiogram of the chest showed type I aortic dissection. Cardiothoracic surgery was promptly consulted at that point.  He had extensive surgery to repair his aortic valve and replacement of ascending aorta and arch.  He tolerated the procedure well without complication. He had the following issues postoperatively  -Mild hypokalemia which was corrected -Thrombocytopenia and patient taken off Lovenox and aspirin.[name] Kitchen He had HIT panel obtained which revealed elevated heparin-induced platelet antibody -Small left pleural effusion and patient diuresed with Lasix -Hypertension which was treated with lisinopril and subsequent addition of amlodipine for better control -Left upper extremity swelling with duplex showing positive DVT of the left IJ and subclavian veins. Patient discharged on Coumadin -Atrial fibrillation with rapid ventricular response. Patient started on amiodarone and discharged on Coumadin  Discharge medications reviewed. He knows to stop Lasix after oral 1 more week and also stop potassium at that time. He is recovering fairly well.   He is here today with issue of some urine frequency. He states he is getting up about 45 minutes every night to urinate. Occasional slow stream but no burning with urination. No flank pain. No fevers or chills. No known history of  BPH.  Other issue is he has had some inflamed external hemorrhoids. No straining for bowel movements. He's had external hemorrhoids in the past. He has not seen any bleeding. He tried lidocaine cream without much improvement. Also tried some Preparation H.  Using incentive spirometry regularly. No dyspnea. No chest pains. Very minimal chest wall soreness.  Past Medical History:  Diagnosis Date  . Arthritis   . Blood in the stool   . Chicken pox   . Hay fever   . History of colonic polyps   . History of colonoscopy   . Hyperlipidemia    Past Surgical History:  Procedure Laterality Date  . ABDOMINAL ADHESION SURGERY  2007  . AORTIC INTERVENTION N/A 05/08/2017   Procedure: HYPOTHERMIC CIRCULATORY ARREST;  Surgeon: [name], MD;  Location: [place];  Service: Open Heart Surgery;  Laterality: N/A;  . ASCENDING AORTIC ROOT REPLACEMENT N/A 05/08/2017   Procedure: REPLACEMENT OF ASCENDING AORTIC ARCH;  Surgeon: [name], MD;  Location: [place];  Service: Open Heart Surgery;  Laterality: N/A;  . CANNULATION FOR CARDIOPULMONARY BYPASS Right 05/08/2017   Procedure: AXILLARY CANNULATION;  Surgeon: [name], MD;  Location: [place];  Service: Open Heart Surgery;  Laterality: Right;  . ENDOVASCULAR REPAIR/STENT GRAFT  05/08/2017   Procedure: STENT OF LEFT SUBCLAVIAN ARTERY with 10x39VBX;  Surgeon: [name], MD;  Location: [place];  Service: Open Heart Surgery;;  . herniated diks surgery L-5  1996  . herniated disk surgery c 6-7  1986  . INTRAOPERATIVE TRANSESOPHAGEAL ECHOCARDIOGRAM N/A 05/08/2017   Procedure: INTRAOPERATIVE TRANSESOPHAGEAL ECHOCARDIOGRAM;  Surgeon: [name],  MD;  [facility];  Service: Open Heart Surgery;  Laterality: N/A;  . REPLACEMENT ASCENDING AORTA N/A 05/08/2017   Procedure: REPLACEMENT ASCENDING AORTA;  Surgeon: [name], MD;  Location: [place];  Service: Open Heart Surgery;  Laterality: N/A;  . THORACIC AORTIC ENDOVASCULAR  STENT GRAFT  05/08/2017   Procedure: STENT GRAFTING OF DESCENDING THORACIC AORTA;  Surgeon: [name], MD;  Location: [place];  Service: Open Heart Surgery;;    reports that he quit smoking about 42 years ago. He has never used smokeless tobacco. He reports that he does not drink alcohol or use drugs. family history includes Alzheimer's disease (age of onset: 61) in his father; Cancer in his brother; Dementia in his father; Hypertension in his unknown relative; Stroke (age of onset: 69) in his mother; Sudden death in his other. Allergies  Allergen Reactions  . Heparin     HIT (Heparin antibody positive, SRA positive; 05/2017)     Review of Systems  Constitutional: Negative for chills and fever.  Respiratory: Negative for shortness of breath and wheezing.   Cardiovascular: Negative for chest pain.  Gastrointestinal: Negative for abdominal pain, constipation, diarrhea, nausea and vomiting.  Genitourinary: Positive for frequency. Negative for flank pain, hematuria and testicular pain.  Musculoskeletal: Negative for back pain.  Neurological: Negative for dizziness, syncope and light-headedness.  Psychiatric/Behavioral: Negative for confusion.       Objective:   Physical Exam  Constitutional: He is oriented to person, place, and time. He appears well-developed and well-nourished.  Cardiovascular: Normal rate and regular rhythm.   Pulmonary/Chest: Effort normal and breath sounds normal. No respiratory distress. He has no wheezes. He has no rales.  Incisions from recent surgery are healing well. No signs of secondary infection. He has (as expected) bruising of the chest wall    Musculoskeletal: He exhibits no edema.  Neurological: He is alert and oriented to person, place, and time. No cranial nerve deficit.       Assessment:     #1 recent aortic dissection with extensive aortic valve and aortic arch surgery as above doing well  #2 urinary frequency. Urine dipstick is  completely normal with no leukocytes, blood, or nitrites. No evidence for infection but may have some BPH symptoms accounting for some of his frequency but overall stream is fairly good although somewhat slow to get started  #3 external hemorrhoids    Plan:     -Flu vaccine already given -Avoid any anticholinergic medications -Hopefully urine frequency symptoms would improve as he gets off Lasix in a few days -Follow-up immediately for any fever or other concerns -Try over-the-counter Anusol HC for external hemorrhoids -Discussed measures to reduce constipation -continue close follow up with Cardiology and CVTS.  Eulas Post MD [facility]

## 2017-05-23 NOTE — Patient Instructions (Signed)
Hemorrhoids Hemorrhoids are swollen veins in and around the rectum or anus. There are two types of hemorrhoids:  Internal hemorrhoids. These occur in the veins that are just inside the rectum. They may poke through to the outside and become irritated and painful.  External hemorrhoids. These occur in the veins that are outside of the anus and can be felt as a painful swelling or hard lump near the anus.  Most hemorrhoids do not cause serious problems, and they can be managed with home treatments such as diet and lifestyle changes. If home treatments do not help your symptoms, procedures can be done to shrink or remove the hemorrhoids. What are the causes? This condition is caused by increased pressure in the anal area. This pressure may result from various things, including:  Constipation.  Straining to have a bowel movement.  Diarrhea.  Pregnancy.  Obesity.  Sitting for long periods of time.  Heavy lifting or other activity that causes you to strain.  Anal sex.  What are the signs or symptoms? Symptoms of this condition include:  Pain.  Anal itching or irritation.  Rectal bleeding.  Leakage of stool (feces).  Anal swelling.  One or more lumps around the anus.  How is this diagnosed? This condition can often be diagnosed through a visual exam. Other exams or tests may also be done, such as:  Examination of the rectal area with a gloved hand (digital rectal exam).  Examination of the anal canal using a small tube (anoscope).  A blood test, if you have lost a significant amount of blood.  A test to look inside the colon (sigmoidoscopy or colonoscopy).  How is this treated? This condition can usually be treated at home. However, various procedures may be done if dietary changes, lifestyle changes, and other home treatments do not help your symptoms. These procedures can help make the hemorrhoids smaller or remove them completely. Some of these procedures involve  surgery, and others do not. Common procedures include:  Rubber band ligation. Rubber bands are placed at the base of the hemorrhoids to cut off the blood supply to them.  Sclerotherapy. Medicine is injected into the hemorrhoids to shrink them.  Infrared coagulation. A type of light energy is used to get rid of the hemorrhoids.  Hemorrhoidectomy surgery. The hemorrhoids are surgically removed, and the veins that supply them are tied off.  Stapled hemorrhoidopexy surgery. A circular stapling device is used to remove the hemorrhoids and use staples to cut off the blood supply to them.  Follow these instructions at home: Eating and drinking  Eat foods that have a lot of fiber in them, such as whole grains, beans, nuts, fruits, and vegetables. Ask your health care provider about taking products that have added fiber (fiber supplements).  Drink enough fluid to keep your urine clear or pale yellow. Managing pain and swelling  Take warm sitz baths for 20 minutes, 3-4 times a day to ease pain and discomfort.  If directed, apply ice to the affected area. Using ice packs between sitz baths may be helpful. ? Put ice in a plastic bag. ? Place a towel between your skin and the bag. ? Leave the ice on for 20 minutes, 2-3 times a day. General instructions  Take over-the-counter and prescription medicines only as told by your health care provider.  Use medicated creams or suppositories as told.  Exercise regularly.  Go to the bathroom when you have the urge to have a bowel movement. Do not wait.    Avoid straining to have bowel movements.  Keep the anal area dry and clean. Use wet toilet paper or moist towelettes after a bowel movement.  Do not sit on the toilet for long periods of time. This increases blood pooling and pain. Contact a health care provider if:  You have increasing pain and swelling that are not controlled by treatment or medicine.  You have uncontrolled bleeding.  You  have difficulty having a bowel movement, or you are unable to have a bowel movement.  You have pain or inflammation outside the area of the hemorrhoids. This information is not intended to replace advice given to you by your health care provider. Make sure you discuss any questions you have with your health care provider. Document Released: 07/23/2000 Document Revised: 12/24/2015 Document Reviewed: 04/09/2015 Elsevier Interactive Patient Education  2017 [employer]  Try some Anusol HC cream.

## 2017-05-24 ENCOUNTER — Telehealth: Payer: Self-pay

## 2017-05-24 NOTE — Telephone Encounter (Signed)
Patients wife called concerned about frequent urination.  Patient was seen by PCP and does not have a uti.  Patient denies any swelling and is back to pre-surgery weight.  Advised patient can stop lasixs and if swelling comes back to contact cardiology.  Patient is also having issues with hemorrhoids.  Advised to try over the counter preporation H or sits bath.  If it does not get better to follow up with PCP.

## 2017-05-26 ENCOUNTER — Telehealth: Payer: Self-pay | Admitting: Family Medicine

## 2017-05-26 NOTE — Telephone Encounter (Signed)
Error

## 2017-05-27 ENCOUNTER — Ambulatory Visit (INDEPENDENT_AMBULATORY_CARE_PROVIDER_SITE_OTHER): Payer: Medicare HMO | Admitting: Family Medicine

## 2017-05-27 ENCOUNTER — Encounter: Payer: Self-pay | Admitting: Family Medicine

## 2017-05-27 VITALS — BP 110/70 | HR 62 | Temp 98.1°F | Wt 172.4 lb

## 2017-05-27 DIAGNOSIS — K649 Unspecified hemorrhoids: Secondary | ICD-10-CM | POA: Diagnosis not present

## 2017-05-27 DIAGNOSIS — R350 Frequency of micturition: Secondary | ICD-10-CM | POA: Diagnosis not present

## 2017-05-27 LAB — POCT URINALYSIS DIPSTICK
Bilirubin, UA: NEGATIVE
GLUCOSE UA: NEGATIVE
KETONES UA: NEGATIVE
Leukocytes, UA: NEGATIVE
Nitrite, UA: NEGATIVE
PROTEIN UA: NEGATIVE
RBC UA: NEGATIVE
SPEC GRAV UA: 1.01 (ref 1.010–1.025)
Urobilinogen, UA: 0.2 E.U./dL
pH, UA: 6 (ref 5.0–8.0)

## 2017-05-27 NOTE — Patient Instructions (Signed)
Hemorrhoids Hemorrhoids are swollen veins in and around the rectum or anus. There are two types of hemorrhoids:  Internal hemorrhoids. These occur in the veins that are just inside the rectum. They may poke through to the outside and become irritated and painful.  External hemorrhoids. These occur in the veins that are outside of the anus and can be felt as a painful swelling or hard lump near the anus.  Most hemorrhoids do not cause serious problems, and they can be managed with home treatments such as diet and lifestyle changes. If home treatments do not help your symptoms, procedures can be done to shrink or remove the hemorrhoids. What are the causes? This condition is caused by increased pressure in the anal area. This pressure may result from various things, including:  Constipation.  Straining to have a bowel movement.  Diarrhea.  Pregnancy.  Obesity.  Sitting for long periods of time.  Heavy lifting or other activity that causes you to strain.  Anal sex.  What are the signs or symptoms? Symptoms of this condition include:  Pain.  Anal itching or irritation.  Rectal bleeding.  Leakage of stool (feces).  Anal swelling.  One or more lumps around the anus.  How is this diagnosed? This condition can often be diagnosed through a visual exam. Other exams or tests may also be done, such as:  Examination of the rectal area with a gloved hand (digital rectal exam).  Examination of the anal canal using a small tube (anoscope).  A blood test, if you have lost a significant amount of blood.  A test to look inside the colon (sigmoidoscopy or colonoscopy).  How is this treated? This condition can usually be treated at home. However, various procedures may be done if dietary changes, lifestyle changes, and other home treatments do not help your symptoms. These procedures can help make the hemorrhoids smaller or remove them completely. Some of these procedures involve  surgery, and others do not. Common procedures include:  Rubber band ligation. Rubber bands are placed at the base of the hemorrhoids to cut off the blood supply to them.  Sclerotherapy. Medicine is injected into the hemorrhoids to shrink them.  Infrared coagulation. A type of light energy is used to get rid of the hemorrhoids.  Hemorrhoidectomy surgery. The hemorrhoids are surgically removed, and the veins that supply them are tied off.  Stapled hemorrhoidopexy surgery. A circular stapling device is used to remove the hemorrhoids and use staples to cut off the blood supply to them.  Follow these instructions at home: Eating and drinking  Eat foods that have a lot of fiber in them, such as whole grains, beans, nuts, fruits, and vegetables. Ask your health care provider about taking products that have added fiber (fiber supplements).  Drink enough fluid to keep your urine clear or pale yellow. Managing pain and swelling  Take warm sitz baths for 20 minutes, 3-4 times a day to ease pain and discomfort.  If directed, apply ice to the affected area. Using ice packs between sitz baths may be helpful. ? Put ice in a plastic bag. ? Place a towel between your skin and the bag. ? Leave the ice on for 20 minutes, 2-3 times a day. General instructions  Take over-the-counter and prescription medicines only as told by your health care provider.  Use medicated creams or suppositories as told.  Exercise regularly.  Go to the bathroom when you have the urge to have a bowel movement. Do not wait.    Avoid straining to have bowel movements.  Keep the anal area dry and clean. Use wet toilet paper or moist towelettes after a bowel movement.  Do not sit on the toilet for long periods of time. This increases blood pooling and pain. Contact a health care provider if:  You have increasing pain and swelling that are not controlled by treatment or medicine.  You have uncontrolled bleeding.  You  have difficulty having a bowel movement, or you are unable to have a bowel movement.  You have pain or inflammation outside the area of the hemorrhoids. This information is not intended to replace advice given to you by your health care provider. Make sure you discuss any questions you have with your health care provider. Document Released: 07/23/2000 Document Revised: 12/24/2015 Document Reviewed: 04/09/2015 Elsevier Interactive Patient Education  2017 [employer].  Try over the  Counter Anusol [facility] suppository.

## 2017-05-27 NOTE — Progress Notes (Signed)
Subjective:     Patient ID: Don Medina, male   DOB: 05-13-1941, 76 y.o.   MRN: [id number]  HPI Is seen with some continued urine frequency but no burning with urination whatsoever. No fevers or chills. Denies any obstructive symptoms. When seen earlier in week was having some mild slow stream but not at this point. He has stopped Lasix at this point. His urine frequency at night has reduced slightly.  Was getting up about every 45 minutes and now about every 90 minutes.  He had foley in for about 5 days in hsopital.  He's had some prolapsing hemorrhoids which have been quite bothersome at times. No pain.   No active bleeding. He states he is not straining much with bowel movements.  Past Medical History:  Diagnosis Date  . Arthritis   . Blood in the stool   . Chicken pox   . Hay fever   . History of colonic polyps   . History of colonoscopy   . Hyperlipidemia    Past Surgical History:  Procedure Laterality Date  . ABDOMINAL ADHESION SURGERY  2007  . AORTIC INTERVENTION N/A 05/08/2017   Procedure: HYPOTHERMIC CIRCULATORY ARREST;  Surgeon: [name], MD;  Location: [place];  Service: Open Heart Surgery;  Laterality: N/A;  . ASCENDING AORTIC ROOT REPLACEMENT N/A 05/08/2017   Procedure: REPLACEMENT OF ASCENDING AORTIC ARCH;  Surgeon: [name], MD;  Location: [place];  Service: Open Heart Surgery;  Laterality: N/A;  . CANNULATION FOR CARDIOPULMONARY BYPASS Right 05/08/2017   Procedure: AXILLARY CANNULATION;  Surgeon: [name], MD;  Location: [place];  Service: Open Heart Surgery;  Laterality: Right;  . ENDOVASCULAR REPAIR/STENT GRAFT  05/08/2017   Procedure: STENT OF LEFT SUBCLAVIAN ARTERY with 10x39VBX;  Surgeon: [name], MD;  Location: [place];  Service: Open Heart Surgery;;  . herniated diks surgery L-5  1996  . herniated disk surgery c 6-7  1986  . INTRAOPERATIVE TRANSESOPHAGEAL ECHOCARDIOGRAM N/A 05/08/2017   Procedure: INTRAOPERATIVE TRANSESOPHAGEAL  ECHOCARDIOGRAM;  Surgeon: [name], MD;  [facility];  Service: Open Heart Surgery;  Laterality: N/A;  . REPLACEMENT ASCENDING AORTA N/A 05/08/2017   Procedure: REPLACEMENT ASCENDING AORTA;  Surgeon: [name], MD;  Location: [place];  Service: Open Heart Surgery;  Laterality: N/A;  . THORACIC AORTIC ENDOVASCULAR STENT GRAFT  05/08/2017   Procedure: STENT GRAFTING OF DESCENDING THORACIC AORTA;  Surgeon: [name], MD;  Location: [place];  Service: Open Heart Surgery;;    reports that he quit smoking about 42 years ago. He has never used smokeless tobacco. He reports that he does not drink alcohol or use drugs. family history includes Alzheimer's disease (age of onset: 76) in his father; Cancer in his brother; Dementia in his father; Hypertension in his unknown relative; Stroke (age of onset: 85) in his mother; Sudden death in his other. Allergies  Allergen Reactions  . Heparin     HIT (Heparin antibody positive, SRA positive; 05/2017)     Review of Systems  Constitutional: Negative for chills and fever.  Genitourinary: Positive for frequency. Negative for flank pain and hematuria.       Objective:   Physical Exam  Constitutional: He appears well-developed and well-nourished.  Cardiovascular: Normal rate and regular rhythm.   Pulmonary/Chest: Effort normal and breath sounds normal. No respiratory distress. He has no wheezes. He has no rales.  Genitourinary:  Genitourinary Comments: No visible external hemorrhoids. No perianal inflammation or erythema  Assessment:     #1 urine frequency. Recent urine dipstick was completely normal- and repeat today remains normal.. Denies any major obstructive symptoms at this point. Sounds like more urine urgency.  I suspect some of his symptoms related to recent foley   #2 prolapsing hemorrhoids-without pain, active bleeding, or other complication.      Plan:     -Consider Anusol HC suppositories -Continue  measures to reduce constipation -Repeat urinalysis=normal. -explained to pt and wife, liklihood of infection slim in absence of leukocytes, nitrites, blood, etc but will culture to be safe -Would avoid anticholinergic medications at this point for urinary urgency secondary to recent surgical events-unless symptoms more severe.  If needing anything, would consider Myrbetriq to avoid anti-cholinergics.  Eulas Post MD [facility]

## 2017-05-29 LAB — URINE CULTURE
MICRO NUMBER: 81171544
SPECIMEN QUALITY: ADEQUATE

## 2017-05-30 ENCOUNTER — Ambulatory Visit (INDEPENDENT_AMBULATORY_CARE_PROVIDER_SITE_OTHER): Payer: Medicare HMO | Admitting: *Deleted

## 2017-05-30 ENCOUNTER — Telehealth: Payer: Self-pay

## 2017-05-30 DIAGNOSIS — I4891 Unspecified atrial fibrillation: Secondary | ICD-10-CM | POA: Diagnosis not present

## 2017-05-30 DIAGNOSIS — Z9889 Other specified postprocedural states: Secondary | ICD-10-CM | POA: Diagnosis not present

## 2017-05-30 DIAGNOSIS — Z5181 Encounter for therapeutic drug level monitoring: Secondary | ICD-10-CM

## 2017-05-30 LAB — POCT INR: INR: 1.7

## 2017-05-30 NOTE — Telephone Encounter (Signed)
Pt presented to coumadin appointment with complaints of dizziness upon standing.  Pt had recent hospitalization for aortic dissection. Pt denies other symptoms.  Offered to call EMS, suggested pt contact Dr Servando Snare or PCP.  Patient's wife prefers patient be seen by cardiologist, Dr Johnsie Cancel consulted during patient's recent hospitalization.  Appt scheduled for Wednesday October 24 with Truitt Merle. Instructed pt to have someone with him when going from lying/sitting to standing, contact Dr Servando Snare, PCP or go to the emergency room if symptoms worsen.  Patient and his wife verbalized understanding.

## 2017-05-31 NOTE — Progress Notes (Addendum)
CARDIOLOGY OFFICE NOTE  Date:  06/01/2017    Don Medina Date of Birth: 1941-01-27 Medical Record #[id number]  PCP:  Don Post, MD  Cardiologist:  Don Medina  Chief Complaint  Patient presents with  . Follow-up    Medina op aortic dissection.     History of Present Illness: Don Medina is a 76 y.o. male who presents today for a work in visit. Seen for Don Medina (remote patient of Don Medina). Wife sees Don Medina as well.   He has a history of HLD and PVCs. No history of CAD, heart attack, HTN, HLD or DM. No prior tobacco use.   Presented towards the end of September with chest pain - found to have Type A aortic dissection.  Pain was noted to be worse with inspiration and did not improve with SL NTG. BP was at 124/52 upon initial check. D-dimer was elevated but initial CTA chest was negative for PE but did show a thoracic aortic aneurysm measuring 5cm with soft tissue stranding. A repeat abdominal CTA was obtained and showed a complex type A aortic dissection extending from the aortic root to aortic bifurcation with the dissection flap approaching the margin of coronary sinuses but not extending into the coronary arteries. The dissection flap extended into proximal right brachiocephalic artery, left subclavian artery, left common carotid artery, and proximal common iliac arteries bilaterally. AAA up to 5.3 cm.  He was taken to the OR by Dr. Servando Medina and underwent repair of the dissection with replacement of the ascending aortic arch, ascending aorta using a 28x30 Hemashield Platinum Graft, stent grafting of the descending thoracic aorta, stenting of the descending thoracic aorta, stenting of the left subclavian, and debraching of the left carotid artery. Underwent repair of the aortic valve with re suspension.    No complications were noted during the procedure. He was hypotensive in the Medina-op setting and required Epinephrine and Dopamine and was able to have  these stopped.  Did require Milrinone and IV Lasix. Did have thrombocytopenia and required Bivalirudin. HIT was +. Had rapid AF - requiring amiodarone and was placed on coumadin.   Comes in today. Here with his wife today. Home almost 2 weeks. Frequent urination - like every hour on the hour - interfering with his sleep. Not on any more diuretics. Also with hemorrhoids - some bleeding. Has seen GP twice last week - no UTI.  Has had dizziness with standing. This started about 3 days ago - no frank syncope. He used to liberalize his salt in the past. He is well below his dry weight. No chest pain. We are following his INR - no active bleeding noted.   Past Medical History:  Diagnosis Date  . ADJ DISORDER WITH MIXED ANXIETY & DEPRESSED MOOD 09/08/2009   Qualifier: Diagnosis of  By: Elease Hashimoto MD, Don    . Arthritis   . Atrial fibrillation ([place]) [I48.91] 05/23/2017  . Blood in the stool   . BRADYCARDIA 03/10/2010   Qualifier: Diagnosis of  By: Elease Hashimoto MD, Don    . CHEST PAIN 03/19/2010   Qualifier: Diagnosis of  By: Allean Found, LPN, Christine    . Chicken pox   . COLONIC POLYPS, HX OF 01/13/2009   Qualifier: Diagnosis of  By: Valma Cava LPN, Izora Gala    . Episode of generalized weakness 02/17/2016  . GERD (gastroesophageal reflux disease) 10/03/2012  . Hay fever   . History of colonic polyps   . History of colonoscopy   .  Hyperlipidemia   . MUSCLE STRAIN, HAMSTRING MUSCLE 09/08/2009   Qualifier: Diagnosis of  By: Elease Hashimoto MD, Don    . ORTHOSTATIC DIZZINESS 03/05/2010   Qualifier: Diagnosis of  By: Elease Hashimoto MD, Don    . Other premature beats 03/17/2010   Qualifier: Diagnosis of  By: Lovena Le, MD, Martyn Malay   . Premature ventricular contraction 01/05/2011  . S/P aortic dissection repair 05/08/2017  . THROMBOCYTOPENIA 03/05/2010   Qualifier: Diagnosis of  By: Elease Hashimoto MD, Don    . Weakness 12/09/2015    Past Surgical History:  Procedure Laterality Date  . ABDOMINAL ADHESION SURGERY  2007  .  AORTIC INTERVENTION N/A 05/08/2017   Procedure: HYPOTHERMIC CIRCULATORY ARREST;  Surgeon: Don Isaac, MD;  Location: [facility];  Service: Open Heart Surgery;  Laterality: N/A;  . ASCENDING AORTIC ROOT REPLACEMENT N/A 05/08/2017   Procedure: REPLACEMENT OF ASCENDING AORTIC ARCH;  Surgeon: Don Isaac, MD;  Location: [facility];  Service: Open Heart Surgery;  Laterality: N/A;  . CANNULATION FOR CARDIOPULMONARY BYPASS Right 05/08/2017   Procedure: AXILLARY CANNULATION;  Surgeon: Don Isaac, MD;  Location: [facility];  Service: Open Heart Surgery;  Laterality: Right;  . ENDOVASCULAR REPAIR/STENT GRAFT  05/08/2017   Procedure: STENT OF LEFT SUBCLAVIAN ARTERY with 10x39VBX;  Surgeon: Don Isaac, MD;  Location: [facility];  Service: Open Heart Surgery;;  . herniated diks surgery L-5  1996  . herniated disk surgery c 6-7  1986  . INTRAOPERATIVE TRANSESOPHAGEAL ECHOCARDIOGRAM N/A 05/08/2017   Procedure: INTRAOPERATIVE TRANSESOPHAGEAL ECHOCARDIOGRAM;  Surgeon: Don Isaac, MD;  [facility];  Service: Open Heart Surgery;  Laterality: N/A;  . REPLACEMENT ASCENDING AORTA N/A 05/08/2017   Procedure: REPLACEMENT ASCENDING AORTA;  Surgeon: Don Isaac, MD;  Location: [facility];  Service: Open Heart Surgery;  Laterality: N/A;  . THORACIC AORTIC ENDOVASCULAR STENT GRAFT  05/08/2017   Procedure: STENT GRAFTING OF DESCENDING THORACIC AORTA;  Surgeon: Don Isaac, MD;  Location: [facility] OR;  Service: Open Heart Surgery;;     Medications: Current Meds  Medication Sig  . acetaminophen (TYLENOL) 325 MG tablet Take 2 tablets (650 mg total) by mouth every 6 (six) hours as needed for mild pain.  [name] Kitchen amiodarone (PACERONE) 200 MG tablet Take 1 tablet (200 mg total) by mouth daily. For one week then take Amiodarone 200 mg by mouth daily thereafter.  [name] Kitchen aspirin 81 MG tablet Take 1 tablet (81 mg total) by mouth daily.  . citalopram (CELEXA) 20 MG tablet TAKE ONE TABLET BY MOUTH ONCE DAILY (Patient taking  differently: Take 20 mg by mouth once a day)  . desonide (DESOWEN) 0.05 % cream Apply 1 application topically daily as needed (for facial flares).   . folic acid (FOLVITE) 1 MG tablet Take 1 mg by mouth daily.    . metoprolol tartrate (LOPRESSOR) 50 MG tablet Take 0.5 tablets (25 mg total) by mouth 2 (two) times daily.  . Misc Natural Products (GLUCOS-CHONDROIT-MSM COMPLEX) TABS Take 1 tablet by mouth 3 (three) times daily.  . Omega-3 Fatty Acids (FISH OIL) 1200 MG CAPS Take 1,200 mg by mouth 2 (two) times daily.   . simvastatin (ZOCOR) 20 MG tablet TAKE ONE TABLET BY MOUTH AT BEDTIME (Patient taking differently: Take 20 mg by mouth at bedtime)  . traMADol (ULTRAM) 50 MG tablet Take 50 mg by mouth every 4-6 hours PRN severe pain.  [name] Kitchen warfarin (COUMADIN) 2.5 MG tablet Take 1 tablet (2.5 mg total) by mouth daily. Or  as directed. (Patient taking differently: 2.5 mg. Take one tab Sunday and Thursday.  Take one and half tab all other days)  . [DISCONTINUED] amiodarone (PACERONE) 200 MG tablet Take 1 tablet (200 mg total) by mouth 2 (two) times daily. For one week then take Amiodarone 200 mg by mouth daily thereafter.  . [DISCONTINUED] amLODipine (NORVASC) 10 MG tablet Take 1 tablet (10 mg total) by mouth daily.  . [DISCONTINUED] lisinopril (PRINIVIL,ZESTRIL) 20 MG tablet Take 1 tablet (20 mg total) by mouth 2 (two) times daily.  . [DISCONTINUED] metoprolol tartrate (LOPRESSOR) 50 MG tablet Take 1 tablet (50 mg total) by mouth 2 (two) times daily.     Allergies: Allergies  Allergen Reactions  . 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 He has never used smokeless tobacco. He reports that he does not drink alcohol or use drugs.   Family History: The patient's family history includes Alzheimer's disease (age of onset: 61) in his father; Cancer in his brother; Dementia in his father;  Hypertension in his unknown relative; Stroke (age of onset: 14) in his mother; Sudden death in his other.   Review of Systems: Please see the history of present illness.   Otherwise, the review of systems is positive for none.   All other systems are reviewed and negative.   Physical Exam: VS:  Pulse 62   Ht 5\' 8"  (1.727 m)   Wt 169 lb 6.4 oz (76.8 kg)   SpO2 97%   BMI 25.76 kg/m  .  BMI Body mass index is 25.76 kg/m.  Wt Readings from Last 3 Encounters:  06/01/17 169 lb 6.4 oz (76.8 kg)  05/27/17 172 lb 6.4 oz (78.2 kg)  05/23/17 180 lb 3.2 oz (81.7 kg)   Laying BP is 130/80 with HR of 65 Sitting BP is 126/70 with HR 68 Standing BP is 120/72 with HR 87 Standing BP at 3 minutes is 80/60 with HR of 72   General: Pleasant. Little pale. Alert and in no acute distress.   HEENT: Normal.  Neck: Supple, no JVD, carotid bruits, or masses noted.  Cardiac: Regular rate and rhythm. No real murmur appreciated. His incisions look great.  No edema.  Respiratory:  Lungs are clear to auscultation bilaterally with normal work of breathing.  GI: Soft and nontender.  MS: No deformity or atrophy. Gait and ROM intact.  Skin: Warm and dry. Color is normal.  Neuro:  Strength and sensation are intact and no gross focal deficits noted.  Psych: Alert, appropriate and with normal affect.   LABORATORY DATA:  EKG:  EKG is ordered today. This demonstrates NSR with marked ST and T wave changes (new). Reviewed with Don Medina here today.   Lab Results  Component Value Date   WBC 10.1 05/20/2017   HGB 9.3 (L) 05/20/2017   HCT 27.8 (L) 05/20/2017   PLT 332 05/20/2017   GLUCOSE 129 (H) 05/18/2017   CHOL 128 12/09/2015   TRIG 58 12/09/2015   HDL 60 12/09/2015   LDLCALC 56 12/09/2015   ALT 28 05/18/2017   AST 25 05/18/2017   NA 138 05/18/2017   K 4.1 05/18/2017   CL 103 05/18/2017   CREATININE 1.09 05/18/2017   BUN 16 05/18/2017   CO2 24 05/18/2017   TSH 2.00 08/23/2013   PSA 1.40 08/23/2013     INR 1.7 05/30/2017  HGBA1C 6.1 (H) 12/09/2015     BNP (last 3 results) No results for input(s): BNP in the last 8760 hours.  ProBNP (last 3 results) No results for input(s): PROBNP in the last 8760 hours.   Other Studies Reviewed Today:  TEE: 05/08/2017  Left ventricle: Normal cavity size and wall thickness. LV systolic function is normal with an EF of 55-60%. There are no obvious wall motion abnormalities.  Aortic valve: The valve is trileaflet. Severe regurgitation.  Aorta: The ascending aorta is dilated. 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  Right ventricle: Cavity is moderately to severely dilated. Moderately reduced systolic function.  Right atrium: Cavity is dilated.  Aorta: Graft present in the ascending aorta.   Echocardiogram: 12/2015 Study Conclusions  - Left ventricle: The cavity size was normal. There was mild concentric hypertrophy. Systolic function was normal. The estimated ejection fraction was in the range of 55% to 60%. Wall motion was normal; there were no regional wall motion abnormalities. The study was not technically sufficient to allow evaluation of LV diastolic dysfunction due to frequent ventricular ectopy - Aortic valve: Mildly calcified annulus. Trileaflet; normal thickness, mildly calcified leaflets. - Mitral valve: Calcified annulus. - Right ventricle: The cavity size was moderately dilated. Wall thickness was normal. Systolic function was mildly to moderately reduced. - Impressions: Copmared to prior study, RV dimensions have increased and RV function appears mildly reduced.  Impressions:  - Copmared to prior study, RV dimensions have increased and RV function appears mildly reduced.  Assessment/Plan:  1. Type A Aortic Dissection - presented with new-onset, sharp chest discomfort and found to have a complex type A aortic dissection. Taken to the OR on 9/30  by Dr. Servando Medina and underwent repair of the dissection with replacement of the ascending aortic arch, ascending aorta using a 28x30 Hemashield Platinum Graft, stent grafting of the descending thoracic aorta, stenting of the descending thoracic aorta, stenting of the left subclavian, debraching of the left carotid artery, and repair of the aortic valve with resuspension. - EF 55-60% by intraoperative TEE. Consider ischemic evaluation in the outpatient setting once further out from his surgical repair despite not having any anginal symptoms leading up to his presentation.  Patient subsequently seen with Don Medina. EKG reviewed. EKG changes noted.   Don Medina has advised cardiac CT in the next couple of weeks. Will discuss with EBG regarding the order for the CTA that he has ordered.  In light of current issues - will check limited echo. Arrange early follow up back here.   2. Orthostatic hypotension - quite orthostatic here today - has a history of this as well. Stopping most of his medicines. See back early next week. They will continue to monitor the BP.   3. Medina op AF - on amiodarone and coumadin.   4. +HIT noted  5. Medina op anemia - lab repeated today.   6. Frequent urination - not on diuretics - needs repeat UA - lab to be checked as well.     Current medicines are reviewed with the patient today.  The patient does not have concerns regarding medicines other than what has been noted above.  The following changes have been made:  See above.  Labs/ tests ordered today include:    Orders Placed This Encounter  Procedures  . Basic metabolic panel  . CBC  . Hepatic function panel  . TSH  . Urinalysis  . EKG 12-Lead  . ECHOCARDIOGRAM LIMITED  Disposition:   FU with me on Monday here in the office. Don Medina has agreed to follow going forward and we will get an appointment set up. Limited echo to be arranged. Will need cardiac CT arranged on return. Lab today.    Patient  is agreeable to this plan and will call if any problems develop in the interim.   SignedTruitt Merle, NP  06/01/2017 2:19 PM  [facility] [address] [place], [place]  [address] Phone: [phone] Fax: [phone]

## 2017-06-01 ENCOUNTER — Encounter: Payer: Self-pay | Admitting: Cardiovascular Disease

## 2017-06-01 ENCOUNTER — Ambulatory Visit (INDEPENDENT_AMBULATORY_CARE_PROVIDER_SITE_OTHER): Payer: Medicare HMO | Admitting: Nurse Practitioner

## 2017-06-01 ENCOUNTER — Encounter: Payer: Self-pay | Admitting: Nurse Practitioner

## 2017-06-01 VITALS — HR 62 | Ht 68.0 in | Wt 169.4 lb

## 2017-06-01 DIAGNOSIS — I7101 Dissection of thoracic aorta: Secondary | ICD-10-CM | POA: Diagnosis not present

## 2017-06-01 DIAGNOSIS — R42 Dizziness and giddiness: Secondary | ICD-10-CM

## 2017-06-01 DIAGNOSIS — I951 Orthostatic hypotension: Secondary | ICD-10-CM

## 2017-06-01 DIAGNOSIS — I71019 Dissection of thoracic aorta, unspecified: Secondary | ICD-10-CM

## 2017-06-01 DIAGNOSIS — Z79899 Other long term (current) drug therapy: Secondary | ICD-10-CM | POA: Diagnosis not present

## 2017-06-01 MED ORDER — AMIODARONE HCL 200 MG PO TABS: 200.0000 mg | ORAL_TABLET | Freq: Every day | ORAL | 1 refills | Status: DC

## 2017-06-01 MED ORDER — METOPROLOL TARTRATE 50 MG PO TABS: 25.0000 mg | ORAL_TABLET | Freq: Two times a day (BID) | ORAL | 1 refills | Status: DC

## 2017-06-01 NOTE — Patient Instructions (Addendum)
We will be checking the following labs today - BMET, CBC, TSH, UA   Medication Instructions:    Continue with your current medicines. BUT  I am stopping Norvasc  I am stopping the Lisinopril  We will continue the Lopressor but only 1/2 dose (25 mg) twice a day  Amiodarone should be just once a day    Testing/Procedures To Be Arranged: Your physician has requested that you have an echocardiogram. Echocardiography is a painless test that uses sound waves to create images of your heart. It provides your doctor with information about the size and shape of your heart and how well your heart's chambers and valves are working. This procedure takes approximately one hour. There are no restrictions for this procedure.   Follow-Up:   See me on Monday at 2 pm - we will plan to arrange for cardiac CT at the time  Arrange visit with Dr. Johnsie Cancel - his first available.     Other Special Instructions:   Keep monitoring your BP    If you need a refill on your cardiac medications before your next appointment, please call your pharmacy.   Call the [place] office at [phone] if you have any questions, problems or concerns.

## 2017-06-02 ENCOUNTER — Encounter: Payer: Self-pay | Admitting: Nurse Practitioner

## 2017-06-02 ENCOUNTER — Telehealth: Payer: Self-pay

## 2017-06-02 LAB — HEPATIC FUNCTION PANEL
ALT: 11 IU/L (ref 0–44)
AST: 13 IU/L (ref 0–40)
Albumin: 3.5 g/dL (ref 3.5–4.8)
Alkaline Phosphatase: 74 IU/L (ref 39–117)
Bilirubin Total: 0.3 mg/dL (ref 0.0–1.2)
Bilirubin, Direct: 0.12 mg/dL (ref 0.00–0.40)
Total Protein: 6.7 g/dL (ref 6.0–8.5)

## 2017-06-02 LAB — BASIC METABOLIC PANEL
BUN/Creatinine Ratio: 14 (ref 10–24)
BUN: 17 mg/dL (ref 8–27)
CO2: 25 mmol/L (ref 20–29)
Calcium: 9.2 mg/dL (ref 8.6–10.2)
Chloride: 97 mmol/L (ref 96–106)
Creatinine, Ser: 1.19 mg/dL (ref 0.76–1.27)
GFR calc Af Amer: 69 mL/min/{1.73_m2} (ref >59)
GFR calc non Af Amer: 59 mL/min/{1.73_m2} — ABNORMAL LOW (ref >59)
Glucose: 108 mg/dL — ABNORMAL HIGH (ref 65–99)
Potassium: 4.7 mmol/L (ref 3.5–5.2)
Sodium: 134 mmol/L (ref 134–144)

## 2017-06-02 LAB — URINALYSIS
Bilirubin, UA: NEGATIVE
Glucose, UA: NEGATIVE
Leukocytes, UA: NEGATIVE
Nitrite, UA: NEGATIVE
Protein, UA: NEGATIVE
RBC, UA: NEGATIVE
Specific Gravity, UA: 1.022 (ref 1.005–1.030)
Urobilinogen, Ur: 0.2 mg/dL (ref 0.2–1.0)
pH, UA: 5.5 (ref 5.0–7.5)

## 2017-06-02 LAB — TSH: TSH: 5.98 u[IU]/mL — ABNORMAL HIGH (ref 0.450–4.500)

## 2017-06-02 LAB — CBC
Hematocrit: 36.7 % — ABNORMAL LOW (ref 37.5–51.0)
Hemoglobin: 11.8 g/dL — ABNORMAL LOW (ref 13.0–17.7)
MCH: 30.3 pg (ref 26.6–33.0)
MCHC: 32.2 g/dL (ref 31.5–35.7)
MCV: 94 fL (ref 79–97)
Platelets: 329 10*3/uL (ref 150–379)
RBC: 3.89 x10E6/uL — ABNORMAL LOW (ref 4.14–5.80)
RDW: 14.9 % (ref 12.3–15.4)
WBC: 8.8 10*3/uL (ref 3.4–10.8)

## 2017-06-02 NOTE — Telephone Encounter (Signed)
Called patient's wife to inform her to call Dr. Everrett Coombe office to reschedule CT that was ordered and schedule at their office. Dr. Johnsie Cancel is wanting patient to have CT later, around December. Patient's wife will call and reschedule CT.

## 2017-06-03 ENCOUNTER — Encounter: Payer: Self-pay | Admitting: Nurse Practitioner

## 2017-06-04 ENCOUNTER — Encounter: Payer: Self-pay | Admitting: Nurse Practitioner

## 2017-06-06 ENCOUNTER — Ambulatory Visit: Payer: Medicare HMO | Admitting: Cardiology

## 2017-06-06 ENCOUNTER — Encounter: Payer: Self-pay | Admitting: Nurse Practitioner

## 2017-06-06 ENCOUNTER — Ambulatory Visit (INDEPENDENT_AMBULATORY_CARE_PROVIDER_SITE_OTHER): Payer: Medicare HMO | Admitting: *Deleted

## 2017-06-06 ENCOUNTER — Ambulatory Visit (INDEPENDENT_AMBULATORY_CARE_PROVIDER_SITE_OTHER): Payer: Medicare HMO | Admitting: Nurse Practitioner

## 2017-06-06 VITALS — BP 115/71 | HR 71 | Ht 68.0 in | Wt 168.8 lb

## 2017-06-06 DIAGNOSIS — Z79899 Other long term (current) drug therapy: Secondary | ICD-10-CM

## 2017-06-06 DIAGNOSIS — I951 Orthostatic hypotension: Secondary | ICD-10-CM | POA: Diagnosis not present

## 2017-06-06 DIAGNOSIS — Z5181 Encounter for therapeutic drug level monitoring: Secondary | ICD-10-CM | POA: Diagnosis not present

## 2017-06-06 DIAGNOSIS — I4891 Unspecified atrial fibrillation: Secondary | ICD-10-CM | POA: Diagnosis not present

## 2017-06-06 DIAGNOSIS — I7101 Dissection of thoracic aorta: Secondary | ICD-10-CM | POA: Diagnosis not present

## 2017-06-06 DIAGNOSIS — Z9889 Other specified postprocedural states: Secondary | ICD-10-CM

## 2017-06-06 DIAGNOSIS — I71019 Dissection of thoracic aorta, unspecified: Secondary | ICD-10-CM

## 2017-06-06 DIAGNOSIS — R42 Dizziness and giddiness: Secondary | ICD-10-CM

## 2017-06-06 DIAGNOSIS — I480 Paroxysmal atrial fibrillation: Secondary | ICD-10-CM

## 2017-06-06 LAB — POCT INR: INR: 1.6

## 2017-06-06 MED ORDER — AMIODARONE HCL 200 MG PO TABS: 100.0000 mg | ORAL_TABLET | Freq: Every day | ORAL | 1 refills | Status: DC

## 2017-06-06 NOTE — Progress Notes (Signed)
CARDIOLOGY OFFICE NOTE  Date:  06/06/2017    Don Medina Date of Birth: 29-Mar-1941 Medical Record #[id number]  PCP:  Don Post, MD  Cardiologist:  Don Medina   Chief Complaint  Patient presents with  . Follow-up    5 day check - seen for Don Medina    History of Present Illness: Don Medina is a 76 y.o. male who presents today for a 5 day chek. Seen for Don Medina (remote patient of Don Medina). Wife sees Don Medina as well.   He has a history of HLD and PVCs. No history of CAD, heart attack, HTN, HLD or DM. No prior tobacco use.   Presented towards the end of September with chest pain - Medina to have Type A aortic dissection.  Pain was noted to be worse with inspiration and did not improve with SL NTG. BP was at 124/52 upon initial check. D-dimer was elevated but initial CTA chest was negative for PE but did show a thoracic aortic aneurysm measuring 5cm with soft tissue stranding.A repeat abdominal CTA was obtained and showed a complex type A aortic dissection extending from theaortic root to aortic bifurcation with thedissection flap approachingthe margin of coronary sinuses but not extendinginto the coronary arteries. The dissection flap extended into proximal right brachiocephalic artery, left subclavian artery,left common carotid artery, and proximal common iliac arteries bilaterally. AAAup to 5.3 cm.  He was taken to the OR by Dr. Servando Medina and underwent repair of the dissection with replacement of the ascending aortic arch, ascending aorta using a 28x30 Hemashield Platinum Graft, stent grafting of the descending thoracic aorta, stenting of the descending thoracic aorta, stenting of the left subclavian, and debraching of the left carotid artery. Underwent repair of the aortic valve with re suspension.   No complications were noted during the procedure. He was hypotensive in the Medina-op setting and required Epinephrine and Dopamine and  was able to have these stopped.  Did require Milrinone and IV Lasix. Did have thrombocytopenia and required Bivalirudin. HIT was +. Had rapid AF - requiring amiodarone and was placed on coumadin.   I saw him last week for dizziness - Medina to have recurrent orthostasis - he has had this in the past. Seen with Don Medina . Stopped a good bit of his medicines. Cut amiodarone and Lopressor back. Labs ok. Needing limited echo - gets tomorrow. Will need cardiac CT but it will need to include the entire aorta.   Comes in today. Here with his wife today. He is better. Still with some dizziness but not  like he was. He is using some salt - popcorn/peanuts. He feels like the amiodarone and Lopressor are contributing factors. Bowels working better. Not short of breath. Less urinary frequency. No swelling. He is improved overall. Walked about 1/4 mile yesterday and did fine. He was planning on some laser eye surgery - but will hold off for a few more weeks.   Past Medical History:  Diagnosis Date  . ADJ DISORDER WITH MIXED ANXIETY & DEPRESSED MOOD 09/08/2009   Qualifier: Diagnosis of  By: Don Medina    . Arthritis   . Atrial fibrillation ([place]) [I48.91] 05/23/2017  . Blood in the stool   . BRADYCARDIA 03/10/2010   Qualifier: Diagnosis of  By: Don Medina    . CHEST PAIN 03/19/2010   Qualifier: Diagnosis of  By: Don Medina    . Chicken pox   . COLONIC POLYPS,  HX OF 01/13/2009   Qualifier: Diagnosis of  By: Don Medina Don Medina    . Episode of generalized weakness 02/17/2016  . GERD (gastroesophageal reflux disease) 10/03/2012  . Hay fever   . History of colonic polyps   . History of colonoscopy   . Hyperlipidemia   . MUSCLE STRAIN, HAMSTRING MUSCLE 09/08/2009   Qualifier: Diagnosis of  By: Don Medina    . ORTHOSTATIC DIZZINESS 03/05/2010   Qualifier: Diagnosis of  By: Don Medina    . Other premature beats 03/17/2010   Qualifier: Diagnosis of  By: Don Medina, Don Medina   . Premature ventricular contraction 01/05/2011  . S/P aortic dissection repair 05/08/2017  . THROMBOCYTOPENIA 03/05/2010   Qualifier: Diagnosis of  By: Don Medina    . Weakness 12/09/2015    Past Surgical History:  Procedure Laterality Date  . ABDOMINAL ADHESION SURGERY  2007  . AORTIC INTERVENTION N/A 05/08/2017   Procedure: HYPOTHERMIC CIRCULATORY ARREST;  Surgeon: Don Isaac, MD;  Location: [place];  Service: Open Heart Surgery;  Laterality: N/A;  . ASCENDING AORTIC ROOT REPLACEMENT N/A 05/08/2017   Procedure: REPLACEMENT OF ASCENDING AORTIC ARCH;  Surgeon: Don Isaac, MD;  Location: [place];  Service: Open Heart Surgery;  Laterality: N/A;  . CANNULATION FOR CARDIOPULMONARY BYPASS Right 05/08/2017   Procedure: AXILLARY CANNULATION;  Surgeon: Don Isaac, MD;  Location: [place];  Service: Open Heart Surgery;  Laterality: Right;  . ENDOVASCULAR REPAIR/STENT GRAFT  05/08/2017   Procedure: STENT OF LEFT SUBCLAVIAN ARTERY with 10x39VBX;  Surgeon: Don Isaac, MD;  Location: [place];  Service: Open Heart Surgery;;  . herniated diks surgery L-5  1996  . herniated disk surgery c 6-7  1986  . INTRAOPERATIVE TRANSESOPHAGEAL ECHOCARDIOGRAM N/A 05/08/2017   Procedure: INTRAOPERATIVE TRANSESOPHAGEAL ECHOCARDIOGRAM;  Surgeon: Don Isaac, MD;  Location: [facility] OR;  Service: Open Heart Surgery;  Laterality: N/A;  . REPLACEMENT ASCENDING AORTA N/A 05/08/2017   Procedure: REPLACEMENT ASCENDING AORTA;  Surgeon: Don Isaac, MD;  Location: [place];  Service: Open Heart Surgery;  Laterality: N/A;  . THORACIC AORTIC ENDOVASCULAR STENT GRAFT  05/08/2017   Procedure: STENT GRAFTING OF DESCENDING THORACIC AORTA;  Surgeon: Don Isaac, MD;  Location: [facility] OR;  Service: Open Heart Surgery;;     Medications: Current Meds  Medication Sig  . acetaminophen (TYLENOL) 325 MG tablet Take 2 tablets (650 mg total) by mouth every 6 (six) hours as needed for mild pain.    [name] Kitchen amiodarone (PACERONE) 200 MG tablet Take 0.5 tablets (100 mg total) by mouth daily. For one week then take Amiodarone 200 mg by mouth daily thereafter.  [name] Kitchen aspirin 81 MG tablet Take 1 tablet (81 mg total) by mouth daily.  . citalopram (CELEXA) 20 MG tablet TAKE ONE TABLET BY MOUTH ONCE DAILY (Patient taking differently: Take 20 mg by mouth once a day)  . desonide (DESOWEN) 0.05 % cream Apply 1 application topically daily as needed (for facial flares).   . folic acid (FOLVITE) 1 MG tablet Take 1 mg by mouth daily.    . metoprolol tartrate (LOPRESSOR) 50 MG tablet Take 0.5 tablets (25 mg total) by mouth 2 (two) times daily.  . Misc Natural Products (GLUCOS-CHONDROIT-MSM COMPLEX) TABS Take 1 tablet by mouth 3 (three) times daily.  . Omega-3 Fatty Acids (FISH OIL) 1200 MG CAPS Take 1,200 mg by mouth 2 (two) times daily.   . simvastatin (ZOCOR) 20 MG tablet TAKE ONE  TABLET BY MOUTH AT BEDTIME (Patient taking differently: Take 20 mg by mouth at bedtime)  . traMADol (ULTRAM) 50 MG tablet Take 50 mg by mouth every 4-6 hours PRN severe pain.  [name] Kitchen warfarin (COUMADIN) 2.5 MG tablet Take 1 tablet (2.5 mg total) by mouth daily. Or as directed. (Patient taking differently: 2.5 mg. Take one tab Sunday and Thursday.  Take one and half tab all other days)  . [DISCONTINUED] amiodarone (PACERONE) 200 MG tablet Take 1 tablet (200 mg total) by mouth daily. For one week then take Amiodarone 200 mg by mouth daily thereafter.     Allergies: Allergies  Allergen Reactions  . 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 He has never used smokeless tobacco. He reports that he does not drink alcohol or use drugs.   Family History: The patient's family history includes Alzheimer's disease (age of onset: 101) in his father; Cancer in his brother; Dementia in his father; Hypertension in his unknown relative; Stroke (age  of onset: 33) in his mother; Sudden death in his other.   Review of Systems: Please see the history of present illness.   Otherwise, the review of systems is positive for none.   All other systems are reviewed and negative.   Physical Exam: VS:  BP 115/71   Pulse 71   Ht 5\' 8"  (1.727 m)   Wt 168 lb 12.8 oz (76.6 kg)   BMI 25.67 kg/m  .  BMI Body mass index is 25.67 kg/m.  Wt Readings from Last 3 Encounters:  06/06/17 168 lb 12.8 oz (76.6 kg)  06/01/17 169 lb 6.4 oz (76.8 kg)  05/27/17 172 lb 6.4 oz (78.2 kg)   Lying BP is 115/71 with HR of 71 Sitting BP is 129/73 with HR of 72 Standing BP is 101/64 with HR 76 Standing BP at 3 minutes is 103/58 with HR of 80 No symptoms noted   General: Pleasant. Well developed, well nourished and in no acute distress. He looks better today.   HEENT: Normal.  Neck: Supple, no JVD, carotid bruits, or masses noted.  Cardiac: Regular rate and rhythm. No murmurs, rubs, or gallops. No edema. His incisions look good.  Respiratory:  Lungs are clear to auscultation bilaterally with normal work of breathing.  GI: Soft and nontender.  MS: No deformity or atrophy. Gait and ROM intact.  Skin: Warm and dry. Color is normal.  Neuro:  Strength and sensation are intact and no gross focal deficits noted.  Psych: Alert, appropriate and with normal affect.   LABORATORY DATA:  EKG:  EKG is ordered today. This shows NSR with inferolateral T wave changes.   Lab Results  Component Value Date   WBC 8.8 06/01/2017   HGB 11.8 (L) 06/01/2017   HCT 36.7 (L) 06/01/2017   PLT 329 06/01/2017   GLUCOSE 108 (H) 06/01/2017   CHOL 128 12/09/2015   TRIG 58 12/09/2015   HDL 60 12/09/2015   LDLCALC 56 12/09/2015   ALT 11 06/01/2017   AST 13 06/01/2017   NA 134 06/01/2017   K 4.7 06/01/2017   CL 97 06/01/2017   CREATININE 1.19 06/01/2017   BUN 17 06/01/2017   CO2 25 06/01/2017   TSH 5.980 (H) 06/01/2017   PSA 1.40 08/23/2013   INR 1.7 05/30/2017   HGBA1C 6.1  (H) 12/09/2015     BNP (last 3  results) No results for input(s): BNP in the last 8760 hours.  ProBNP (last 3 results) No results for input(s): PROBNP in the last 8760 hours.   Other Studies Reviewed Today:  TEE: 05/08/2017  Left ventricle: Normal cavity size and wall thickness. LV systolic function is normal with an EF of 55-60%. There are no obvious wall motion abnormalities.  Aortic valve: The valve is trileaflet. Severe regurgitation.  Aorta: The ascending aorta is dilated. 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  Right ventricle: Cavity is moderately to severely dilated. Moderately reduced systolic function.  Right atrium: Cavity is dilated.  Aorta: Graft present in the ascending aorta.   Echocardiogram: 12/2015 Study Conclusions  - Left ventricle: The cavity size was normal. There was mild concentric hypertrophy. Systolic function was normal. The estimated ejection fraction was in the range of 55% to 60%. Wall motion was normal; there were no regional wall motion abnormalities. The study was not technically sufficient to allow evaluation of LV diastolic dysfunction due to frequent ventricular ectopy - Aortic valve: Mildly calcified annulus. Trileaflet; normal thickness, mildly calcified leaflets. - Mitral valve: Calcified annulus. - Right ventricle: The cavity size was moderately dilated. Wall thickness was normal. Systolic function was mildly to moderately reduced. - Impressions: Copmared to prior study, RV dimensions have increased and RV function appears mildly reduced.  Impressions:  - Compared to prior study, RV dimensions have increased and RV function appears mildly reduced.  Assessment/Plan:  1. Type A Aortic Dissection - presented with new-onset, sharp chest discomfort and Medina to have acomplex type A aortic dissection. Taken to the OR on 9/30 by Dr. Servando Medina and underwent  repair of the dissection with replacement of the ascending aortic arch, ascending aorta using a 28x30 Hemashield Platinum Graft, stent grafting of the descending thoracic aorta, stenting of the descending thoracic aorta, stenting of the left subclavian, debraching of the left carotid artery, and repair of the aortic valve with resuspension. - EF 55-60% by intraoperative TEE. Consider ischemic evaluation in the outpatient setting once further out from his surgical repair despite not having any anginal symptoms leading up to his presentation.  He looks better today. Will go ahead and arrange for cardiac CT - will need the entire aorta imagined according to Dr. Servando Medina and Dr. Kyla Balzarine prior discussions.   2. Orthostatic hypotension/dizziness - not as orthostatic - off most of his medicines. BP still soft - ok to use salt and continue to monitor BP at home.   3. Medina op AF - on amiodarone and coumadin. He feels like his amiodarone and lopressor are still contributing to his dizziness. He remains in sinus. Will cut the amiodarone back to 100mg  a day. Lopressor dose is unchanged for now.   4. +HIT noted  5. Medina op anemia - repeat lab noted.    6. Frequent urination - this has improved.   Current medicines are reviewed with the patient today.  The patient does not have concerns regarding medicines other than what has been noted above.  The following changes have been made:  See above.  Labs/ tests ordered today include:    Orders Placed This Encounter  Procedures  . CT CORONARY MORPH W/CTA COR W/SCORE W/CA W/CM &/OR WO/CM  . CT CORONARY FRACTIONAL FLOW RESERVE DATA PREP  . CT CORONARY FRACTIONAL FLOW RESERVE FLUID ANALYSIS     Disposition:   FU with Don Medina as planned. Will arrange for his cardiac CT.  Patient is agreeable to this plan and will call if any problems develop in the interim.   SignedTruitt Merle, NP  06/06/2017 2:37 PM  [facility] [address] [place], [place]  [address] Phone: [phone] Fax: [phone]

## 2017-06-06 NOTE — Patient Instructions (Addendum)
We will be checking the following labs today - NONE  INR today   Medication Instructions:    Continue with your current medicines. BUT   I am cutting the Amiodarone back to just 100 mg a day - this will be half a pill daily    Testing/Procedures To Be Arranged:  Cardiac CT - needs entire aorta imagined - per Dr. Johnsie Cancel and Dr. Ceasar Mons  Please arrive at the [facility] main entrance of [facility] at xx:xx AM (30-45 minutes prior to test start time)  [facility] [place], [place] [address] [phone]  Proceed to the [facility] Radiology Department (First Floor).  Please follow these instructions carefully (unless otherwise directed):  Hold all erectile dysfunction medications at least 48 hours prior to test.  On the Night Before the Test: . Drink plenty of water. . Do not consume any caffeinated/decaffeinated beverages or chocolate 12 hours prior to your test. . Do not take any antihistamines 12 hours prior to your test. . If you take Metformin do not take 24 hours prior to test. . If the patient has contrast allergy: ? Patient will need a prescription for Prednisone and very clear instructions (as follows): 1. Prednisone 50 mg - take 13 hours prior to test 2. Take another Prednisone 50 mg 7 hours prior to test 3. Take another Prednisone 50 mg 1 hour prior to test 4. Take Benadryl 50 mg 1 hour prior to test . Patient must complete all four doses of above prophylactic medications. . Patient will need a ride after test due to Benadryl.  On the Day of the Test: . Drink plenty of water. Do not drink any water within one hour of the test. . Do not eat any food 4 hours prior to the test. . You may take your regular medications prior to the test. . IF NOT ON A BETA BLOCKER - Take 50 mg of lopressor (metoprolol) one hour before the test. . HOLD Furosemide morning of the test.  After the Test: . Drink plenty of water. . After  receiving IV contrast, you may experience a mild flushed feeling. This is normal. . On occasion, you may experience a mild rash up to 24 hours after the test. This is not dangerous. If this occurs, you can take Benadryl 25 mg and increase your fluid intake. . If you experience trouble breathing, this can be serious. If it is severe call 911 IMMEDIATELY. If it is mild, please call our office. . If you take any of these medications: Glipizide/Metformin, Avandament, Glucavance, please do not take 48 hours after completing test.   Follow-Up:   See Dr. Johnsie Cancel as planned    Other Special Instructions:   Ok to use some salt  Monitor BP    If you need a refill on your cardiac medications before your next appointment, please call your pharmacy.   Call the [place] office at [phone] if you have any questions, problems or concerns.

## 2017-06-07 ENCOUNTER — Encounter (HOSPITAL_COMMUNITY): Payer: Self-pay | Admitting: *Deleted

## 2017-06-07 ENCOUNTER — Other Ambulatory Visit: Payer: Self-pay

## 2017-06-07 ENCOUNTER — Ambulatory Visit (HOSPITAL_COMMUNITY): Payer: Medicare HMO | Attending: Cardiology

## 2017-06-07 DIAGNOSIS — Z87891 Personal history of nicotine dependence: Secondary | ICD-10-CM | POA: Diagnosis not present

## 2017-06-07 DIAGNOSIS — Z79899 Other long term (current) drug therapy: Secondary | ICD-10-CM | POA: Diagnosis not present

## 2017-06-07 DIAGNOSIS — E785 Hyperlipidemia, unspecified: Secondary | ICD-10-CM | POA: Insufficient documentation

## 2017-06-07 DIAGNOSIS — R42 Dizziness and giddiness: Secondary | ICD-10-CM | POA: Diagnosis not present

## 2017-06-07 DIAGNOSIS — I7101 Dissection of thoracic aorta: Secondary | ICD-10-CM | POA: Diagnosis not present

## 2017-06-07 DIAGNOSIS — I951 Orthostatic hypotension: Secondary | ICD-10-CM | POA: Diagnosis not present

## 2017-06-07 DIAGNOSIS — I71019 Dissection of thoracic aorta, unspecified: Secondary | ICD-10-CM

## 2017-06-07 NOTE — Progress Notes (Signed)
Will FYI Dr. Johnsie Cancel.  Getting cardiac MRI scheduled.

## 2017-06-07 NOTE — Addendum Note (Signed)
Addended by: Gaetano Net on: 06/07/2017 11:58 AM   Modules accepted: Orders

## 2017-06-07 NOTE — Progress Notes (Unsigned)
2D Echo is a Technically Difficult Study due to surgical scarring, and patient mobility restrictions (unable to lay on left side, images obtained with patient laying flat).  Apical images are off axis with patient flat, no Definity contast is administered (no benefit to do so).  TEE or CTA have been performed in past.    Deliah Boston, RDCS

## 2017-06-08 ENCOUNTER — Other Ambulatory Visit: Payer: Self-pay | Admitting: *Deleted

## 2017-06-08 MED ORDER — WARFARIN SODIUM 2.5 MG PO TABS: 2.5000 mg | ORAL_TABLET | ORAL | 3 refills | Status: DC

## 2017-06-09 ENCOUNTER — Telehealth: Payer: Self-pay

## 2017-06-09 ENCOUNTER — Encounter: Payer: Self-pay | Admitting: Nurse Practitioner

## 2017-06-09 MED ORDER — METOPROLOL TARTRATE 25 MG PO TABS: 12.5000 mg | ORAL_TABLET | Freq: Two times a day (BID) | ORAL | 3 refills | Status: DC

## 2017-06-09 NOTE — Telephone Encounter (Signed)
Attempted to call patient. Wife Margaretha Sheffield answered (DPR on file). Made wife aware of Cecille Rubin Gerhardt's recommendations to decrease his Lopressor to 12.5 mg BID. Made wife aware that I will send a new Rx for the 25 mg tablets and that the patient will needs to take 1/2 tablet BID. Also made wife aware that the patient can continue to liberalize his salt intake and to keep his f/u appointment with Dr. Johnsie Cancel. Wife verbalized understanding and thanked me for the call. Rx sent to preferred pharmacy.

## 2017-06-09 NOTE — Telephone Encounter (Signed)
Burtis Junes, NP  to [facility] Triage  2:56 PM  Please call Mr. Ayyad.   I would really like to NOT stop all of his beta blocker. Lets change to Lopressor 25 mg taking 1/2 tablet (12.5 mg) twice a day.   He may continue to liberalize his salt intake.   He has follow up with Dr. Johnsie Cancel.   Thanks  Cecille Rubin

## 2017-06-10 ENCOUNTER — Encounter: Payer: Self-pay | Admitting: Family Medicine

## 2017-06-10 ENCOUNTER — Encounter (HOSPITAL_COMMUNITY): Payer: Self-pay | Admitting: *Deleted

## 2017-06-13 ENCOUNTER — Other Ambulatory Visit: Payer: Self-pay | Admitting: Family Medicine

## 2017-06-13 ENCOUNTER — Ambulatory Visit (INDEPENDENT_AMBULATORY_CARE_PROVIDER_SITE_OTHER): Payer: Medicare HMO | Admitting: *Deleted

## 2017-06-13 ENCOUNTER — Encounter (INDEPENDENT_AMBULATORY_CARE_PROVIDER_SITE_OTHER): Payer: Self-pay

## 2017-06-13 ENCOUNTER — Encounter: Payer: Self-pay | Admitting: Cardiovascular Disease

## 2017-06-13 DIAGNOSIS — I4891 Unspecified atrial fibrillation: Secondary | ICD-10-CM

## 2017-06-13 DIAGNOSIS — Z5181 Encounter for therapeutic drug level monitoring: Secondary | ICD-10-CM

## 2017-06-13 DIAGNOSIS — Z9889 Other specified postprocedural states: Secondary | ICD-10-CM | POA: Diagnosis not present

## 2017-06-13 LAB — PROTIME-INR
INR: 1.6 — ABNORMAL HIGH (ref 0.8–1.2)
PROTHROMBIN TIME: 17.1 s — AB (ref 9.1–12.0)

## 2017-06-14 ENCOUNTER — Other Ambulatory Visit: Payer: Self-pay | Admitting: Family Medicine

## 2017-06-14 ENCOUNTER — Encounter: Payer: Self-pay | Admitting: Family Medicine

## 2017-06-15 NOTE — Telephone Encounter (Signed)
Spoke with the pharmacist and patient.  Rx will be filled and patient is aware of the new directions.

## 2017-06-16 ENCOUNTER — Encounter (INDEPENDENT_AMBULATORY_CARE_PROVIDER_SITE_OTHER): Payer: Self-pay

## 2017-06-16 ENCOUNTER — Other Ambulatory Visit: Payer: Self-pay | Admitting: Cardiothoracic Surgery

## 2017-06-16 ENCOUNTER — Other Ambulatory Visit: Payer: Medicare HMO

## 2017-06-16 ENCOUNTER — Ambulatory Visit: Payer: Medicare HMO | Admitting: Cardiothoracic Surgery

## 2017-06-16 DIAGNOSIS — Z9889 Other specified postprocedural states: Secondary | ICD-10-CM

## 2017-06-17 ENCOUNTER — Other Ambulatory Visit: Payer: Self-pay

## 2017-06-17 ENCOUNTER — Ambulatory Visit (HOSPITAL_COMMUNITY)
Admission: RE | Admit: 2017-06-17 | Discharge: 2017-06-17 | Disposition: A | Discharge status: Home / Self Care | Payer: Medicare HMO | Source: Ambulatory Visit | Attending: Cardiothoracic Surgery | Admitting: Cardiothoracic Surgery

## 2017-06-17 ENCOUNTER — Ambulatory Visit (INDEPENDENT_AMBULATORY_CARE_PROVIDER_SITE_OTHER): Payer: Self-pay | Admitting: Cardiothoracic Surgery

## 2017-06-17 ENCOUNTER — Encounter: Payer: Self-pay | Admitting: Cardiothoracic Surgery

## 2017-06-17 VITALS — BP 112/71 | HR 72 | Resp 16 | Ht 68.0 in | Wt 164.0 lb

## 2017-06-17 DIAGNOSIS — R0602 Shortness of breath: Secondary | ICD-10-CM | POA: Diagnosis not present

## 2017-06-17 DIAGNOSIS — Z9889 Other specified postprocedural states: Secondary | ICD-10-CM | POA: Diagnosis not present

## 2017-06-17 DIAGNOSIS — Z95818 Presence of other cardiac implants and grafts: Secondary | ICD-10-CM | POA: Diagnosis not present

## 2017-06-17 DIAGNOSIS — I71019 Dissection of thoracic aorta, unspecified: Secondary | ICD-10-CM

## 2017-06-17 DIAGNOSIS — J90 Pleural effusion, not elsewhere classified: Secondary | ICD-10-CM | POA: Diagnosis not present

## 2017-06-17 DIAGNOSIS — I7101 Dissection of thoracic aorta: Secondary | ICD-10-CM

## 2017-06-17 NOTE — Patient Instructions (Signed)

## 2017-06-17 NOTE — Progress Notes (Signed)
[address]       [place] [address]             [phone]      Gabe W [place]er [place] Medical Record #[id number] Date of Birth: 1940-12-04  Referring: Lacretia Leigh, MD Primary Care: Eulas Post, MD  Chief Complaint:   POST OP FOLLOW UP 05/08/2017   OPERATIVE REPORT   PREOPERATIVE DIAGNOSIS:  Acute type 1 aortic dissection.  POSTOPERATIVE DIAGNOSIS:  Acute type 1 aortic dissection.  SURGICAL PROCEDURES: 1. Cardiopulmonary bypass with hypothermic circulatory arrest and     antegrade cerebral perfusion. 2. Resuspension, repair of aortic valve, replacement of ascending     aorta, arch with debranching of the cerebral vessels, frozen     elephant trunk with placement of 31 x 31 x 10 cm CTAG Gore stent     graft, placement of a 10 x 39 VBX balloon expandable stent into the     left subclavian artery, a 12 x 8-cm graft off the ascending aorta     to the innominate and left carotid arteries.    History of Present Illness:     Patient returns to the office today in follow-up visit after recent acute aortic dissection treated with frozen elephant trunk repair of aortic valve replacement of a sending aorta and deep branches of the cerebral vessels and stenting takeoff of the left subclavian artery.  Patient has complained of some dizziness but not true vertigo he notes this occurs primarily when he stands up quickly, it has improved since she has been off of Lasix.  He is to have a CTA of the chest abdomen pelvis to evaluate his aorta, in addition this will be combined with a gated cardiac CT as he is had no evaluation of his coronary arteries.      Past Medical History:  Diagnosis Date  . 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 Arthritis   . Atrial fibrillation ([place]) [I48.91] 05/23/2017  . Blood in the stool   . BRADYCARDIA 03/10/2010   Qualifier: Diagnosis  of  By: Elease Hashimoto MD, Bruce    . CHEST PAIN 03/19/2010   Qualifier: Diagnosis of  By: Allean Found, LPN, Christine    . Chicken pox   . COLONIC POLYPS, HX OF 01/13/2009   Qualifier: Diagnosis of  By: Valma Cava LPN, Izora Gala    . Episode of generalized weakness 02/17/2016  . GERD (gastroesophageal reflux disease) 10/03/2012  . Hay fever   . History of colonic polyps   . History of colonoscopy   . Hyperlipidemia   . MUSCLE STRAIN, HAMSTRING MUSCLE 09/08/2009   Qualifier: Diagnosis of  By: Elease Hashimoto MD, Bruce    . ORTHOSTATIC DIZZINESS 03/05/2010   Qualifier: Diagnosis of  By: Elease Hashimoto MD, Bruce    . Other premature beats 03/17/2010   Qualifier: Diagnosis of  By: Lovena Le, MD, Martyn Malay   . Premature ventricular contraction 01/05/2011  . S/P aortic dissection repair 05/08/2017  . THROMBOCYTOPENIA 03/05/2010   Qualifier: Diagnosis of  By: Elease Hashimoto MD, Bruce    . Weakness 12/09/2015     Social History   Tobacco Use  Smoking Status Former Smoker  . Last attempt to quit: 08/09/1974  . Years since quitting: 42.8  Smokeless Tobacco Never Used    Social History   Substance and Sexual Activity  Alcohol Use No  Allergies  Allergen Reactions  . 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 acetaminophen (TYLENOL) 325 MG tablet Take 2 tablets (650 mg total) by mouth every 6 (six) hours as needed for mild pain.    [name] Kitchen amiodarone (PACERONE) 200 MG tablet Take 0.5 tablets (100 mg total) by mouth daily. For one week then take Amiodarone 200 mg by mouth daily thereafter. 30 tablet 1  . aspirin 81 MG tablet Take 1 tablet (81 mg total) by mouth daily. 30 tablet   . citalopram (CELEXA) 20 MG tablet TAKE 1 TABLET BY MOUTH ONCE DAILY (Patient taking differently: TAKE 1/2 FOR 2 WEEKS THEN STOP) 90 tablet 0  . desonide (DESOWEN) 0.05 % cream Apply 1 application topically daily as needed (for facial flares).     .  folic acid (FOLVITE) 1 MG tablet Take 1 mg by mouth daily.      . metoprolol tartrate (LOPRESSOR) 25 MG tablet Take 0.5 tablets (12.5 mg total) by mouth 2 (two) times daily. 90 tablet 3  . Misc Natural Products (GLUCOS-CHONDROIT-MSM COMPLEX) TABS Take 1 tablet by mouth 3 (three) times daily.    . Omega-3 Fatty Acids (FISH OIL) 1200 MG CAPS Take 1,200 mg by mouth 2 (two) times daily.     . simvastatin (ZOCOR) 20 MG tablet TAKE ONE TABLET BY MOUTH AT BEDTIME (Patient taking differently: Take 20 mg by mouth at bedtime) 90 tablet 0  . warfarin (COUMADIN) 2.5 MG tablet Take 1 tablet (2.5 mg total) by mouth as directed. Or as directed. 50 tablet 3   No current facility-administered medications for this visit.        Physical Exam: BP 112/71 (BP Location: Right Arm, Patient Position: Sitting, Cuff Size: Large)   Pulse 72   Resp 16   Ht 5\' 8"  (1.727 m)   Wt 164 lb (74.4 kg)   SpO2 98% Comment: ON RA  BMI 24.94 kg/m   General appearance: alert, cooperative, appears stated age and no distress Neurologic: intact Heart: regular rate and rhythm, S1, S2 normal, no murmur, click, rub or gallop Lungs: clear to auscultation bilaterally Abdomen: soft, non-tender; bowel sounds normal; no masses,  no organomegaly Extremities: extremities normal, atraumatic, no cyanosis or edema Wound: Sternum is well-healed right infraclavicular area is also well-healed No evidence of DVT  Diagnostic Studies & Laboratory data:     Recent Radiology Findings:   Dg Chest 2 View  Result Date: 06/17/2017 CLINICAL DATA:  Shortness of breath. Previous aortic dissection repair EXAM: CHEST  2 VIEW COMPARISON:  May 18, 2017 FINDINGS: There is persistent left pleural effusion with areas of atelectasis and patchy consolidation in the left base. Previously noted pleural effusion on the right has cleared. Currently the right lung is clear. Heart size and pulmonary vascular normal. There is a stent graft in the aortic arch and  proximal descending thoracic aorta as well as in the proximal left subclavian artery. The appearance of the mediastinum is stable. There are surgical clips overlying the upper right hemithorax. No pneumothorax. No evident focal bone lesions. IMPRESSION: Persistent left pleural effusion with areas of consolidation atelectasis in the left base. Right lung is now clear. Stable cardiac silhouette. Stent in aortic arch and descending thoracic aortic region as well as in the left subclavian artery. No adenopathy evident. Surgical clips overlie the upper right hemithorax. Electronically Signed   By: Lowella Grip III  M.D.   On: 06/17/2017 11:49      Recent Lab Findings: Lab Results  Component Value Date   WBC 8.8 06/01/2017   HGB 11.8 (L) 06/01/2017   HCT 36.7 (L) 06/01/2017   PLT 329 06/01/2017   GLUCOSE 108 (H) 06/01/2017   CHOL 128 12/09/2015   TRIG 58 12/09/2015   HDL 60 12/09/2015   LDLCALC 56 12/09/2015   ALT 11 06/01/2017   AST 13 06/01/2017   NA 134 06/01/2017   K 4.7 06/01/2017   CL 97 06/01/2017   CREATININE 1.19 06/01/2017   BUN 17 06/01/2017   CO2 25 06/01/2017   TSH 5.980 (H) 06/01/2017   INR 1.6 (H) 06/13/2017   HGBA1C 6.1 (H) 12/09/2015      Assessment / Plan:   Patient doing well following repair of acute aortic dissection, we are coordinating with cardiology for 1 month follow-up CTA cardiac and chest abdomen pelvis. I have encouraged the patient to enroll in cardiac rehab. 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

## 2017-06-20 ENCOUNTER — Telehealth: Payer: Self-pay | Admitting: *Deleted

## 2017-06-20 ENCOUNTER — Encounter: Payer: Self-pay | Admitting: Nurse Practitioner

## 2017-06-20 ENCOUNTER — Ambulatory Visit (INDEPENDENT_AMBULATORY_CARE_PROVIDER_SITE_OTHER): Payer: Medicare HMO | Admitting: Pharmacist

## 2017-06-20 ENCOUNTER — Telehealth (HOSPITAL_COMMUNITY): Payer: Self-pay

## 2017-06-20 ENCOUNTER — Encounter (INDEPENDENT_AMBULATORY_CARE_PROVIDER_SITE_OTHER): Payer: Self-pay

## 2017-06-20 DIAGNOSIS — Z5181 Encounter for therapeutic drug level monitoring: Secondary | ICD-10-CM | POA: Diagnosis not present

## 2017-06-20 DIAGNOSIS — Z9889 Other specified postprocedural states: Secondary | ICD-10-CM

## 2017-06-20 DIAGNOSIS — I4891 Unspecified atrial fibrillation: Secondary | ICD-10-CM | POA: Diagnosis not present

## 2017-06-20 LAB — POCT INR: INR: 2.3

## 2017-06-20 NOTE — Telephone Encounter (Signed)
[facility] faxed a note in regards to the Rx for Citalopram stating the pt is on Amiodarone now and QT prolongation may occur.  Message sent to Dr Elease Hashimoto.

## 2017-06-20 NOTE — Progress Notes (Signed)
CARDIOLOGY OFFICE NOTE  Date:  06/27/2017    Don Medina Date of Birth: [birth year] Medical Record #[id number]  PCP:  Eulas Post, MD  Cardiologist:  Gillian Shields   No chief complaint on file.   History of Present Illness: Don Medina is a 76 y.o. male seen post hospital for acute type A aortic dissection.   He has a history of HLD and PVCs. No history of CAD, heart attack, HTN, HLD or DM. No prior tobacco use.   Presented towards the end of September with chest pain - found to have Type A aortic dissection.  Pain was noted to be worse with inspiration and did not improve with SL NTG. BP was at 124/52 upon initial check. D-dimer was elevated but initial CTA chest was negative for PE but did show a thoracic aortic aneurysm measuring 5cm with soft tissue stranding.A repeat abdominal CTA was obtained and showed a complex type A aortic dissection extending from theaortic root to aortic bifurcation with thedissection flap approachingthe margin of coronary sinuses but not extendinginto the coronary arteries. The dissection flap extended into proximal right brachiocephalic artery, left subclavian artery,left common carotid artery, and proximal common iliac arteries bilaterally. AAAup to 5.3 cm.  He was taken to the OR by Dr. Servando Snare he underwent grafting of the ascending aorta above the sinus with resuspension of AV Right innominate tied off. Balloon stent graft extending past left subclavian with graft to graft in ascending aorta bifucated to innominate and carotid and graft to graft left subclavian.   No complications were noted during the procedure. He was hypotensive in the post-op setting and required Epinephrine and Dopamine and was able to have these stopped.  Did require Milrinone and IV Lasix. Did have thrombocytopenia and required Bivalirudin. HIT was +. Had rapid AF - requiring amiodarone and was placed on coumadin.   Prior to surgery was very  [facility] at [facility]. Plan for cardiac CTA to assess coronary arteries and repair Post op echo reviewed 06/07/17 Ascending aorta 3.7 cm no AR EF 55-60%   Wife cam down with shingles He has had no rash Daughter up from [place] with grand daughter   Past Medical History:  Diagnosis Date  . 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 Arthritis   . Atrial fibrillation ([place]) [I48.91] 05/23/2017  . Blood in the stool   . BRADYCARDIA 03/10/2010   Qualifier: Diagnosis of  By: Elease Hashimoto MD, Bruce    . CHEST PAIN 03/19/2010   Qualifier: Diagnosis of  By: Allean Found, LPN, Christine    . Chicken pox   . COLONIC POLYPS, HX OF 01/13/2009   Qualifier: Diagnosis of  By: Valma Cava LPN, Izora Gala    . Episode of generalized weakness 02/17/2016  . GERD (gastroesophageal reflux disease) 10/03/2012  . Hay fever   . History of colonic polyps   . History of colonoscopy   . Hyperlipidemia   . MUSCLE STRAIN, HAMSTRING MUSCLE 09/08/2009   Qualifier: Diagnosis of  By: Elease Hashimoto MD, Bruce    . ORTHOSTATIC DIZZINESS 03/05/2010   Qualifier: Diagnosis of  By: Elease Hashimoto MD, Bruce    . Other premature beats 03/17/2010   Qualifier: Diagnosis of  By: Lovena Le, MD, Martyn Malay   . Premature ventricular contraction 01/05/2011  . S/P aortic dissection repair 05/08/2017  . THROMBOCYTOPENIA 03/05/2010   Qualifier: Diagnosis of  By: Elease Hashimoto MD, Bruce    .  Weakness 12/09/2015    Past Surgical History:  Procedure Laterality Date  . ABDOMINAL ADHESION SURGERY  2007  . AXILLARY CANNULATION Right 05/08/2017   Performed by [name], MD at [facility] OR  . DEBRANCHING OF LEFT CAROTID ARTERY AND INNOMINATE ARTERY  05/08/2017   Performed by [name], MD at [facility] OR  . herniated diks surgery L-5  1996  . herniated disk surgery c 6-7  1986  . HYPOTHERMIC CIRCULATORY ARREST N/A 05/08/2017   Performed by [name], MD at [place]  . INTRAOPERATIVE TRANSESOPHAGEAL  ECHOCARDIOGRAM N/A 05/08/2017   Performed by [name], MD at [place] N/A 05/08/2017   Performed by [name], MD at [place] N/A 05/08/2017   Performed by [name], MD at [place]  05/08/2017   Performed by [name], MD at [place] with [id number]  05/08/2017   Performed by [name], MD at [facility] OR     Medications: Current Meds  Medication Sig  . acetaminophen (TYLENOL) 325 MG tablet Take 2 tablets (650 mg total) by mouth every 6 (six) hours as needed for mild pain.  [name] Kitchen amiodarone (PACERONE) 200 MG tablet Take 0.5 tablets (100 mg total) by mouth daily. For one week then take Amiodarone 200 mg by mouth daily thereafter.  [name] Kitchen aspirin 81 MG tablet Take 1 tablet (81 mg total) by mouth daily.  . citalopram (CELEXA) 20 MG tablet TAKE 1 TABLET BY MOUTH ONCE DAILY (Patient taking differently: TAKE 1/2 FOR 2 WEEKS THEN STOP)  . desonide (DESOWEN) 0.05 % cream Apply 1 application topically daily as needed (for facial flares).   . folic acid (FOLVITE) 1 MG tablet Take 1 mg by mouth daily.    . metoprolol tartrate (LOPRESSOR) 25 MG tablet Take 0.5 tablets (12.5 mg total) by mouth 2 (two) times daily.  . Misc Natural Products (GLUCOS-CHONDROIT-MSM COMPLEX) TABS Take 1 tablet by mouth 3 (three) times daily.  . simvastatin (ZOCOR) 20 MG tablet TAKE ONE TABLET BY MOUTH AT BEDTIME (Patient taking differently: Take 20 mg by mouth at bedtime)  . traMADol (ULTRAM) 50 MG tablet   . warfarin (COUMADIN) 2.5 MG tablet Take 1 tablet (2.5 mg total) by mouth as directed. Or as directed.     Allergies: Allergies  Allergen Reactions  . 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 he has never used smokeless tobacco. He reports that  he does not drink alcohol or use drugs.   Family History: The patient's family history includes Alzheimer's disease (age of onset: 59) in his father; Cancer in his brother; Dementia in his father; Hypertension in his unknown relative; Stroke (age of onset: 59) in his mother; Sudden death in his other.   Review of Systems: Please see the history of present illness.   Otherwise, the review of systems is positive for none.   All other systems are reviewed and negative.   Physical Exam: VS:  BP 126/82   Pulse 73   Ht 5\' 8"  (1.727 m)   Wt 171 lb 4 oz (77.7 kg)   SpO2 96%   BMI 26.04 kg/m  .  BMI Body mass index is 26.04 kg/m.  Wt Readings from Last 3 Encounters:  06/27/17 171 lb 4 oz (77.7 kg)  06/17/17 164 lb (74.4 kg)  06/06/17 168 lb 12.8 oz (76.6 kg)    General: Pleasant. Well developed, well nourished and in no acute distress. He looks better today.   HEENT: Normal.  Neck: Supple, no JVD, carotid bruits, or masses noted.  Cardiac: Regular rate and rhythm. No murmurs, rubs, or gallops. No edema. His incisions look good.  Sternotomy healing well  Respiratory:  Lungs are clear to auscultation bilaterally with normal work of breathing.  GI: Soft and nontender.  MS: No deformity or atrophy. Gait and ROM intact.  Skin: Warm and dry. Color is normal.  Neuro:  Strength and sensation are intact and no gross focal deficits noted.  Psych: Alert, appropriate and with normal affect.   LABORATORY DATA:  EKG: 06/06/17  This shows NSR with inferolateral T wave changes. Inferior J point elevation   Lab Results  Component Value Date   WBC 8.8 06/01/2017   HGB 11.8 (L) 06/01/2017   HCT 36.7 (L) 06/01/2017   PLT 329 06/01/2017   GLUCOSE 108 (H) 06/01/2017   CHOL 128 12/09/2015   TRIG 58 12/09/2015   HDL 60 12/09/2015   LDLCALC 56 12/09/2015   ALT 11 06/01/2017   AST 13 06/01/2017   NA 134 06/01/2017   K 4.7 06/01/2017   CL 97 06/01/2017   CREATININE 1.19 06/01/2017   BUN 17  06/01/2017   CO2 25 06/01/2017   TSH 5.980 (H) 06/01/2017   PSA 1.40 08/23/2013   INR 2.3 06/20/2017   HGBA1C 6.1 (H) 12/09/2015     BNP (last 3 results) No results for input(s): BNP in the last 8760 hours.  ProBNP (last 3 results) No results for input(s): PROBNP in the last 8760 hours.   Other Studies Reviewed Today:  TEE: 05/08/2017  Left ventricle: Normal cavity size and wall thickness. LV systolic function is normal with an EF of 55-60%. There are no obvious wall motion abnormalities.  Aortic valve: The valve is trileaflet. Severe regurgitation.  Aorta: The ascending aorta is dilated. 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  Right ventricle: Cavity is moderately to severely dilated. Moderately reduced systolic function.  Right atrium: Cavity is dilated.  Aorta: Graft present in the ascending aorta.     Assessment/Plan:  1. Type A Aortic Dissection Cardiac CTA ordered 06/29/17 will scan from neck down to assess grafts and coronary arteries see if vertebralis ok   And make sure no left subclavian steal. AV ok no AR   2. Orthostatic hypotension/dizziness - meds adjusted down lopressor 12.5 bid improved   3. Post op AF - on coumadin maintaining NSR d/c amiodarone continue beta blocker  Will likely stop coumadin in 3 months   4. +HIT noted   5. Post op anemia - repeat lab noted.  Hct 36.7 stable   6. Abnormal ECG with inferior lateral T wave changes assess coronary arteries with CTA  Note if heart cath needed will Need to be done from femoral artery given grafts to innominate and left subclavian   Current medicines are reviewed with the patient today.  The patient does not have concerns regarding medicines other than what has been noted above.  The following changes have been made:  See above.  Labs/ tests ordered today include: Cardiac CTA 06/29/17    Jenkins Rouge

## 2017-06-20 NOTE — Telephone Encounter (Signed)
Called and spoke with Patient in regards to Cardiac Rehab - Patient is interested in the program.. He has 2 CT scans scheduled next Wednesday 06/29/17. He would like for me to f/u on the 21st.

## 2017-06-20 NOTE — Telephone Encounter (Signed)
Already answered. Pt is reducing dose of Celexa to one half tab for 2 weeks and then D/C

## 2017-06-21 ENCOUNTER — Encounter (INDEPENDENT_AMBULATORY_CARE_PROVIDER_SITE_OTHER): Payer: Self-pay

## 2017-06-27 ENCOUNTER — Encounter: Payer: Self-pay | Admitting: Cardiovascular Disease

## 2017-06-27 ENCOUNTER — Encounter (INDEPENDENT_AMBULATORY_CARE_PROVIDER_SITE_OTHER): Payer: Self-pay

## 2017-06-27 ENCOUNTER — Ambulatory Visit: Payer: Medicare HMO | Admitting: Cardiovascular Disease

## 2017-06-27 ENCOUNTER — Ambulatory Visit (INDEPENDENT_AMBULATORY_CARE_PROVIDER_SITE_OTHER): Payer: Medicare HMO | Admitting: *Deleted

## 2017-06-27 VITALS — BP 126/82 | HR 73 | Ht 68.0 in | Wt 171.2 lb

## 2017-06-27 DIAGNOSIS — Z9889 Other specified postprocedural states: Secondary | ICD-10-CM

## 2017-06-27 DIAGNOSIS — I7101 Dissection of ascending aorta: Secondary | ICD-10-CM

## 2017-06-27 DIAGNOSIS — Z5181 Encounter for therapeutic drug level monitoring: Secondary | ICD-10-CM

## 2017-06-27 DIAGNOSIS — I4891 Unspecified atrial fibrillation: Secondary | ICD-10-CM

## 2017-06-27 LAB — POCT INR: INR: 2.4

## 2017-06-27 NOTE — Patient Instructions (Addendum)
Medication Instructions:  Your physician recommends that you continue on your current medications as directed. Please refer to the Current Medication list given to you today.  Labwork: NONE  Testing/Procedures: NONE  Follow-Up: Your physician wants you to follow-up in: 3 months with Dr. Nishan.   If you need a refill on your cardiac medications before your next appointment, please call your pharmacy.    

## 2017-06-27 NOTE — Patient Instructions (Signed)
Continue taking 1 and 1/2 tablets daily except 2 tablets on Wednesdays and Fridays.  Recheck in one week.  Main #  [phone], [facility] # [phone] Amiodarone reduced to 100 mg daily on 06/06/2017  Call us when scheduled for Cardiac CT

## 2017-06-29 ENCOUNTER — Encounter (HOSPITAL_COMMUNITY): Payer: Self-pay

## 2017-06-29 ENCOUNTER — Ambulatory Visit (HOSPITAL_COMMUNITY): Admission: RE | Admit: 2017-06-29 | Payer: Medicare HMO | Source: Ambulatory Visit

## 2017-06-29 ENCOUNTER — Encounter: Payer: Self-pay | Admitting: Cardiovascular Disease

## 2017-06-29 ENCOUNTER — Telehealth (HOSPITAL_COMMUNITY): Payer: Self-pay

## 2017-06-29 ENCOUNTER — Ambulatory Visit (HOSPITAL_COMMUNITY)
Admission: RE | Admit: 2017-06-29 | Discharge: 2017-06-29 | Disposition: A | Discharge status: Home / Self Care | Payer: Medicare HMO | Source: Ambulatory Visit | Attending: Nurse Practitioner | Admitting: Nurse Practitioner

## 2017-06-29 DIAGNOSIS — Z9889 Other specified postprocedural states: Secondary | ICD-10-CM | POA: Insufficient documentation

## 2017-06-29 DIAGNOSIS — I2510 Atherosclerotic heart disease of native coronary artery without angina pectoris: Secondary | ICD-10-CM | POA: Diagnosis not present

## 2017-06-29 DIAGNOSIS — R42 Dizziness and giddiness: Secondary | ICD-10-CM | POA: Insufficient documentation

## 2017-06-29 DIAGNOSIS — J90 Pleural effusion, not elsewhere classified: Secondary | ICD-10-CM | POA: Diagnosis not present

## 2017-06-29 MED ORDER — METOPROLOL TARTRATE 5 MG/5ML IV SOLN
INTRAVENOUS | Status: AC
  Administered 2017-06-29: 5 mg via INTRAVENOUS
  Filled 2017-06-29: qty 5

## 2017-06-29 MED ORDER — NITROGLYCERIN 0.4 MG SL SUBL
SUBLINGUAL_TABLET | SUBLINGUAL | Status: AC
  Administered 2017-06-29: 0.8 mg via SUBLINGUAL
  Filled 2017-06-29: qty 2

## 2017-06-29 MED ORDER — IOPAMIDOL (ISOVUE-370) INJECTION 76%
INTRAVENOUS | Status: AC
  Administered 2017-06-29: 80 mL
  Filled 2017-06-29: qty 100

## 2017-06-29 MED ORDER — NITROGLYCERIN 0.4 MG SL SUBL
0.8000 mg | SUBLINGUAL_TABLET | Freq: Once | SUBLINGUAL | Status: AC
  Administered 2017-06-29: 0.8 mg via SUBLINGUAL

## 2017-06-29 MED ORDER — METOPROLOL TARTRATE 5 MG/5ML IV SOLN
5.0000 mg | Freq: Once | INTRAVENOUS | Status: AC
  Administered 2017-06-29: 5 mg via INTRAVENOUS

## 2017-06-29 MED ORDER — IOPAMIDOL (ISOVUE-370) INJECTION 76%
INTRAVENOUS | Status: AC
  Filled 2017-06-29: qty 100

## 2017-06-29 NOTE — Telephone Encounter (Signed)
Called to follow up with patient in regards to his appt to see if we can move forward with scheduling for Cardiac Rehab - Patient would like for me to call next week on the 28th.

## 2017-07-02 ENCOUNTER — Encounter: Payer: Self-pay | Admitting: Cardiovascular Disease

## 2017-07-04 ENCOUNTER — Encounter: Payer: Self-pay | Admitting: Cardiovascular Disease

## 2017-07-04 ENCOUNTER — Other Ambulatory Visit: Payer: Self-pay | Admitting: Family Medicine

## 2017-07-04 ENCOUNTER — Ambulatory Visit (INDEPENDENT_AMBULATORY_CARE_PROVIDER_SITE_OTHER): Payer: Medicare HMO | Admitting: *Deleted

## 2017-07-04 DIAGNOSIS — Z5181 Encounter for therapeutic drug level monitoring: Secondary | ICD-10-CM | POA: Diagnosis not present

## 2017-07-04 DIAGNOSIS — Z9889 Other specified postprocedural states: Secondary | ICD-10-CM

## 2017-07-04 DIAGNOSIS — I4891 Unspecified atrial fibrillation: Secondary | ICD-10-CM | POA: Diagnosis not present

## 2017-07-04 LAB — POCT INR: INR: 2

## 2017-07-04 NOTE — Patient Instructions (Signed)
Continue taking 1 and 1/2 tablets daily except 2 tablets on Wednesdays and Fridays.  Recheck in one week.  Main #  [phone], [facility] # [phone] Amiodarone reduced to 100 mg daily on 06/06/2017  Call us when scheduled for Cardiac CT

## 2017-07-05 ENCOUNTER — Telehealth: Payer: Self-pay | Admitting: Family Medicine

## 2017-07-05 NOTE — Telephone Encounter (Signed)
Call to pharmacy- Rx is ready for pick up- patient notified.

## 2017-07-05 NOTE — Telephone Encounter (Signed)
Copied from [place]. Topic: Quick Communication - See Telephone Encounter >> Jul 05, 2017  9:38 AM Synthia Innocent wrote: CRM for notification. See Telephone encounter [id number] refill on simvastatin 20mg , please call into [facility] [address]   07/05/17.

## 2017-07-06 ENCOUNTER — Encounter: Payer: Self-pay | Admitting: Cardiovascular Disease

## 2017-07-06 ENCOUNTER — Telehealth (HOSPITAL_COMMUNITY): Payer: Self-pay

## 2017-07-06 NOTE — Telephone Encounter (Signed)
Attempted to call and speak with patient - wife answered and stated patient is not at home right. Will have patient return phone call.

## 2017-07-12 ENCOUNTER — Ambulatory Visit (INDEPENDENT_AMBULATORY_CARE_PROVIDER_SITE_OTHER): Payer: Medicare HMO | Admitting: *Deleted

## 2017-07-12 DIAGNOSIS — Z9889 Other specified postprocedural states: Secondary | ICD-10-CM | POA: Diagnosis not present

## 2017-07-12 DIAGNOSIS — I4891 Unspecified atrial fibrillation: Secondary | ICD-10-CM

## 2017-07-12 DIAGNOSIS — Z5181 Encounter for therapeutic drug level monitoring: Secondary | ICD-10-CM

## 2017-07-12 LAB — POCT INR: INR: 2.1

## 2017-07-12 NOTE — Patient Instructions (Signed)
Continue taking 1 and 1/2 tablets daily except 2 tablets on Wednesdays and Fridays.  Recheck in 2 weeks.  Main #  [phone], [facility] # [phone] Amiodarone reduced to 100 mg daily on 06/06/2017  Call us when scheduled for Cardiac CT

## 2017-07-13 ENCOUNTER — Telehealth (HOSPITAL_COMMUNITY): Payer: Self-pay

## 2017-07-13 NOTE — Telephone Encounter (Signed)
Called and spoke with patient in regards to Cardiac Rehab - Scheduled orientation on 08/11/2017 at 8:45am. Patient will attend the 9:45am exc class.

## 2017-07-14 ENCOUNTER — Other Ambulatory Visit: Payer: Self-pay

## 2017-07-14 ENCOUNTER — Encounter: Payer: Self-pay | Admitting: Cardiothoracic Surgery

## 2017-07-14 ENCOUNTER — Ambulatory Visit (INDEPENDENT_AMBULATORY_CARE_PROVIDER_SITE_OTHER): Payer: Self-pay | Admitting: Cardiothoracic Surgery

## 2017-07-14 ENCOUNTER — Ambulatory Visit
Admission: RE | Admit: 2017-07-14 | Discharge: 2017-07-14 | Disposition: A | Discharge status: Home / Self Care | Payer: Medicare HMO | Source: Ambulatory Visit | Attending: Cardiothoracic Surgery | Admitting: Cardiothoracic Surgery

## 2017-07-14 VITALS — BP 127/75 | HR 76 | Resp 18 | Ht 68.0 in | Wt 174.0 lb

## 2017-07-14 DIAGNOSIS — J9811 Atelectasis: Secondary | ICD-10-CM | POA: Diagnosis not present

## 2017-07-14 DIAGNOSIS — Z9889 Other specified postprocedural states: Secondary | ICD-10-CM

## 2017-07-14 DIAGNOSIS — I7101 Dissection of thoracic aorta: Secondary | ICD-10-CM

## 2017-07-14 DIAGNOSIS — I71019 Dissection of thoracic aorta, unspecified: Secondary | ICD-10-CM

## 2017-07-14 NOTE — Progress Notes (Signed)
[address]       [place] [address]             [phone]      [place] Medical Record #[id number] Date of Birth: 1940/08/13  Referring: Lacretia Leigh, MD Primary Care: Eulas Post, MD  Chief Complaint:   POST OP FOLLOW UP 05/08/2017 OPERATIVE REPORT PREOPERATIVE DIAGNOSIS:  Acute type 1 aortic dissection. POSTOPERATIVE DIAGNOSIS:  Acute type 1 aortic dissection. SURGICAL PROCEDURES: 1. Cardiopulmonary bypass with hypothermic circulatory arrest and     antegrade cerebral perfusion. 2. Resuspension, repair of aortic valve, replacement of ascending     aorta, arch with debranching of the cerebral vessels, frozen     elephant trunk with placement of 31 x 31 x 10 cm CTAG Gore stent     graft, placement of a 10 x 39 VBX balloon expandable stent into the     left subclavian artery, a 12 x 8-cm graft off the ascending aorta     to the innominate and left carotid arteries.    History of Present Illness:     Patient returns to the office today in follow-up visit after recent acute aortic dissection treated with frozen elephant trunk repair of aortic valve replacement of ascending aorta and deep branches of the cerebral vessels and stenting takeoff of the left subclavian artery.  Patient had complained of some dizziness but not true vertigo he notes this occurs primarily when he stands up quickly, it has improved since she has been off of Lasix.  He had a  CTA of the chest abdomen pelvis to evaluate his aorta, in addition this was  combined with a gated cardiac CT as he is had no evaluation of his coronary arteries.   The patient has continued to increase his physical activity including going to gym, He has been cautioned about strenuous lifting.     Past Medical History:  Diagnosis Date  . 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 Arthritis   . Atrial  fibrillation ([place]) [I48.91] 05/23/2017  . Blood in the stool   . BRADYCARDIA 03/10/2010   Qualifier: Diagnosis of  By: Elease Hashimoto MD, Bruce    . CHEST PAIN 03/19/2010   Qualifier: Diagnosis of  By: Allean Found, LPN, Christine    . Chicken pox   . COLONIC POLYPS, HX OF 01/13/2009   Qualifier: Diagnosis of  By: Valma Cava LPN, Izora Gala    . Episode of generalized weakness 02/17/2016  . GERD (gastroesophageal reflux disease) 10/03/2012  . Hay fever   . History of colonic polyps   . History of colonoscopy   . Hyperlipidemia   . MUSCLE STRAIN, HAMSTRING MUSCLE 09/08/2009   Qualifier: Diagnosis of  By: Elease Hashimoto MD, Bruce    . ORTHOSTATIC DIZZINESS 03/05/2010   Qualifier: Diagnosis of  By: Elease Hashimoto MD, Bruce    . Other premature beats 03/17/2010   Qualifier: Diagnosis of  By: Lovena Le, MD, Martyn Malay   . Premature ventricular contraction 01/05/2011  . S/P aortic dissection repair 05/08/2017  . THROMBOCYTOPENIA 03/05/2010   Qualifier: Diagnosis of  By: Elease Hashimoto MD, Bruce    . Weakness 12/09/2015     Social History   Tobacco Use  Smoking Status Former Smoker  . Last attempt to quit: 08/09/1974  . Years since quitting: 42.9  Smokeless Tobacco Never Used    Social History  Substance and Sexual Activity  Alcohol Use No     Allergies  Allergen Reactions  . 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 acetaminophen (TYLENOL) 325 MG tablet Take 2 tablets (650 mg total) by mouth every 6 (six) hours as needed for mild pain.    [name] Kitchen amiodarone (PACERONE) 200 MG tablet Take 0.5 tablets (100 mg total) by mouth daily. For one week then take Amiodarone 200 mg by mouth daily thereafter. 30 tablet 1  . aspirin 81 MG tablet Take 1 tablet (81 mg total) by mouth daily. 30 tablet   . desonide (DESOWEN) 0.05 % cream Apply 1 application topically daily as needed (for facial flares).     . folic acid (FOLVITE) 1 MG tablet  Take 1 mg by mouth daily.      . metoprolol tartrate (LOPRESSOR) 25 MG tablet Take 0.5 tablets (12.5 mg total) by mouth 2 (two) times daily. 90 tablet 3  . Misc Natural Products (GLUCOS-CHONDROIT-MSM COMPLEX) TABS Take 1 tablet by mouth 3 (three) times daily.    . simvastatin (ZOCOR) 20 MG tablet TAKE 1 TABLET BY MOUTH AT BEDTIME 90 tablet 1  . traMADol (ULTRAM) 50 MG tablet     . warfarin (COUMADIN) 2.5 MG tablet Take 1 tablet (2.5 mg total) by mouth as directed. Or as directed. 50 tablet 3   No current facility-administered medications for this visit.        Physical Exam: BP 127/75   Pulse 76   Resp 18   Ht 5\' 8"  (1.727 m)   Wt 174 lb (78.9 kg)   SpO2 98% Comment: on RA  BMI 26.46 kg/m   General appearance: alert, cooperative, appears stated age and no distress Neurologic: intact Heart: regular rate and rhythm, S1, S2 normal, no murmur, click, rub or gallop Lungs: clear to auscultation bilaterally Abdomen: soft, non-tender; bowel sounds normal; no masses,  no organomegaly Extremities: extremities normal, atraumatic, no cyanosis or edema Wound: Sternum is well-healed right infraclavicular area is also well-healed No evidence of DVT No murmur of AI  Diagnostic Studies & Laboratory data:     Recent Radiology Findings:  Dg Chest 2 View  Result Date: 07/14/2017 CLINICAL DATA:  Aortic dissection repair. EXAM: CHEST  2 VIEW COMPARISON:  CT 08/29/2016 FINDINGS: Median sternotomy. Thoracic aortic stent graft noted in stable the. Heart size normal. No focal infiltrate. No pleural effusion or pneumothorax. Surgical clips noted over the right upper chest. Improved left base subsegmental atelectasis with mild residual. Resolution of left pleural effusion. No pneumothorax . IMPRESSION: 1. Prior median sternotomy. Aortic stent graft. Aortic stent graft appears stable. Heart size stable. 2. Improved left base subsegmental atelectasis. Interim resolution of left pleural effusion.  Electronically Signed   By: Marcello Moores  Register   On: 07/14/2017 14:14     Ct Coronary Morph W/cta Cor Nancy Fetter W/ca W/cm &/or Wo/cm  Addendum Date: 06/29/2017   ADDENDUM REPORT: 06/29/2017 14:03 CLINICAL DATA:  Post Type A Dissection EXAM: Cardiac CTA MEDICATIONS: Sub lingual nitro. 4mg  and lopressor 5mg  TECHNIQUE: The patient was scanned on a Siemens 426 slice scanner. Gantry rotation speed was 250 msecs. Collimation was . 6 mm . A 100 kV prospective scan was supposed to be triggered in the ascending aorta but appears to have started early with ROI in SVC. The 3D data set was interpreted on a dedicated work station using MPR, MIP and VRT  modes. A total of 80cc of contrast was used. FINDINGS: Non-cardiac: See separate report from [facility]. No significant findings on limited lung and soft tissue windows. Coronary Arteries: Right dominant with no anomalies LM:  Less than 30% calcified disease in distal vessel LAD:  Less than 50% calcified disease in proximal and mid vessel D1:  Less than 30% calcified disease in ostial vessel D2:  Less than 30% calcified disease in ostial vessel Circumflex: Appears anomalous from RCA Courses behind aorta with no significant stenosis OM1:  Normal RCA:  Less than 30% calcified disease in proximal and mid RCA PDA:  Normal PLA:  Normal IMPRESSION: 1) No hemodynamically significant CAD. 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   On: 06/29/2017 14:03   Result Date: 06/29/2017 EXAM: OVER-READ INTERPRETATION  CT CHEST The following report is an over-read performed by radiologist Dr. Collene Leyden [facility], [place] on 06/29/2017. This over-read does not include interpretation of cardiac or coronary anatomy or pathology. The coronary CTA interpretation by the cardiologist is attached.  COMPARISON:  CT 05/08/2017 FINDINGS: Vascular: Previously seen aortic dissection again noted in the descending thoracic aorta below the stent graft which extends from the distal aortic arch into the mid descending thoracic aorta prior tube graft repair of the ascending thoracic aorta without dissection. Dissection flap again noted within the left common carotid artery, no longer visualized in the brachiocephalic artery. Stents noted within the left subclavian artery. There is mild cardiomegaly. Mediastinum/Nodes: No mediastinal, hilar, or axillary adenopathy. Trachea and esophagus are unremarkable. Lungs/Pleura: Moderate left pleural effusion. Minimal compressive atelectasis in the lower lobe. No confluent airspace opacity otherwise. Upper Abdomen: Imaging into the upper abdomen shows no acute findings. Musculoskeletal: Chest wall soft tissues are unremarkable. No acute bony abnormality. IMPRESSION: Evidence of prior repair of the type day aortic dissection with tube graft repair in the ascending thoracic aorta and stent graft repair in the distal arch and descending thoracic aorta. The dissection flap again noted, extending from the distal aspect of the stent graft into the upper abdomen. Prior repair of dissection within the brachycephalic artery and stenting of the left subclavian artery. Dissection flap continues to be noted in the left carotid artery. Moderate left pleural effusion with left lower lobe atelectasis. Electronically Signed: By: Rolm Baptise M.D. On: 06/29/2017 10:22     Recent Lab Findings: Lab Results  Component Value Date   WBC 8.8 06/01/2017   HGB 11.8 (L) 06/01/2017   HCT 36.7 (L) 06/01/2017   PLT 329 06/01/2017   GLUCOSE 108 (H) 06/01/2017   CHOL 128 12/09/2015   TRIG 58 12/09/2015   HDL 60 12/09/2015   LDLCALC 56 12/09/2015   ALT 11 06/01/2017   AST 13 06/01/2017   NA 134 06/01/2017   K 4.7 06/01/2017   CL 97 06/01/2017   CREATININE 1.19 06/01/2017   BUN 17 06/01/2017    CO2 25 06/01/2017   TSH 5.980 (H) 06/01/2017   INR 2.1 07/12/2017   HGBA1C 6.1 (H) 12/09/2015                           *[facility]*                        [address]  [place], [place] [address]                            [phone]  ------------------------------------------------------------------- Transthoracic Echocardiography  Patient:    Josten, [place] MR #:       [id number] Study Date: 06/07/2017 Gender:     M Age:        76 Height:     172.7 cm Weight:     76.6 kg BSA:        1.93 m^2 Pt. Status: Room:   ATTENDING    Candee Furbish, M.D.  Windell Moment, Marlane Hatcher  REFERRING    Burtis Junes  PERFORMING   [facility], Outpatient  SONOGRAPHER  [facility], RDCS  cc:  ------------------------------------------------------------------- LV EF: 55% -   60%  ------------------------------------------------------------------- Indications:      Aortic Dissection (I71.0).  ------------------------------------------------------------------- History:   PMH:  Complex type A aortic dissection extending from the aortic root to aortic bifurcation with the dissection flap approaching the margin of coronary sinuses but not extending into the coronary arteries. The dissection flap extended into proximal right brachiocephalic artery, left subclavian artery, left common carotid artery, and proximal common iliac arteries bilaterally. AAA up to 5.3 cm. Status Post Dissection Repair  Dyspnea.  Atrial fibrillation.  Risk factors:  Dizziness, Bradycardia, Palpitations Family history of coronary artery disease. Former tobacco use. Dyslipidemia.  ------------------------------------------------------------------- Study Conclusions  - Left ventricle: The cavity size was normal. Wall thickness was   normal. Systolic function was normal. The estimated ejection   fraction was in the range of 55% to 60%. Wall motion was normal;    there were no regional wall motion abnormalities. Doppler   parameters are consistent with abnormal left ventricular   relaxation (grade 1 diastolic dysfunction). - Aorta: Ascending aortic diameter: 37 mm (S). - Aortic root: The aortic root was normal in size. s/p dissection   repair.  ------------------------------------------------------------------- Study data:  Comparison was made to the study of 05/08/2017.  Study status:  Routine.  Procedure:  Transthoracic echocardiography. Image quality was adequate.          Transthoracic echocardiography.  M-mode, limited 2D, limited spectral Doppler, and color Doppler.  Birthdate:  Patient birthdate: 03/17/1941. Age:  Patient is 76 yr old.  Sex:  Gender: male.    BMI: 25.7 kg/m^2.  Blood pressure:     115/71  Patient status:  Outpatient. Study date:  Study date: 06/07/2017. Study time: 02:19 PM. Location:  [facility]  -------------------------------------------------------------------  ------------------------------------------------------------------- Left ventricle:  The cavity size was normal. Wall thickness was normal. Systolic function was normal. The estimated ejection fraction was in the range of 55% to 60%. Wall motion was normal; there were no regional wall motion abnormalities. Doppler parameters are consistent with abnormal left ventricular relaxation (grade 1 diastolic dysfunction).  ------------------------------------------------------------------- Aortic valve:   Trileaflet; normal thickness leaflets. Mobility was not restricted.  Doppler:  Transvalvular velocity was within the normal range. There was no stenosis. There was no regurgitation.   ------------------------------------------------------------------- Aorta:  Aortic root: The aortic root was normal in size. s/p dissection repair.  ------------------------------------------------------------------- Mitral valve:   Structurally normal valve.    Mobility was not restricted.  Doppler:  Transvalvular velocity was within the normal range. There was no evidence for stenosis.  There was no regurgitation.  ------------------------------------------------------------------- Left atrium:  The atrium was normal in size.  ------------------------------------------------------------------- Right ventricle:  The cavity size was normal. Wall thickness was normal. Systolic function was normal.  ------------------------------------------------------------------- Pulmonic valve:    Structurally normal valve.   Cusp separation was normal.  Doppler:  Transvalvular velocity was within the normal range. There was no evidence for stenosis. There was no regurgitation.  ------------------------------------------------------------------- Tricuspid valve:   Structurally normal valve.    Doppler: Transvalvular velocity was within the normal range. There was no regurgitation.  ------------------------------------------------------------------- Pulmonary artery:   The main pulmonary artery was normal-sized. Systolic pressure was within the normal range.  ------------------------------------------------------------------- Right atrium:  The atrium was normal in size.  ------------------------------------------------------------------- Pericardium:  There was no pericardial effusion.  ------------------------------------------------------------------- Systemic veins: Inferior vena cava: The vessel was normal in size.  ------------------------------------------------------------------- Measurements   Left ventricle                         Value        Reference  LV ID, ED, PLAX chordal        (L)     39.1  mm     43 - 52  LV ID, ES, PLAX chordal                24.6  mm     23 - 38  LV fx shortening, PLAX chordal         37    %      >=29  LV PW thickness, ED                    11.6  mm     ----------  IVS/LV PW ratio, ED                     0.69         <=1.3  Stroke volume, 2D                      77    ml     ----------  Stroke volume/bsa, 2D                  40    ml/m^2 ----------  LV e&', lateral                         12    cm/s   ----------  LV E/e&', lateral                       4.24         ----------  LV e&', medial                          6.86  cm/s   ----------  LV E/e&', medial                        7.42         ----------  LV e&', average                         9.43  cm/s   ----------  LV E/e&', average  5.4          ----------    Ventricular septum                     Value        Reference  IVS thickness, ED                      8.01  mm     ----------    LVOT                                   Value        Reference  LVOT ID, S                             25    mm     ----------  LVOT area                              4.91  cm^2   ----------  LVOT peak velocity, S                  76.9  cm/s   ----------  LVOT mean velocity, S                  48.1  cm/s   ----------  LVOT VTI, S                            15.7  cm     ----------    Aorta                                  Value        Reference  Aortic root ID, ED                     36    mm     ----------  Ascending aorta ID, A-P, S             37    mm     ----------    Left atrium                            Value        Reference  LA ID, A-P, ES                         33    mm     ----------  LA ID/bsa, A-P                         1.71  cm/m^2 <=2.2  LA volume, ES, 1-p A4C                 19.8  ml     ----------  LA volume/bsa, ES, 1-p A4C             10.3  ml/m^2 ----------    Mitral valve  Value        Reference  Mitral E-wave peak velocity            50.9  cm/s   ----------  Mitral A-wave peak velocity            54.8  cm/s   ----------  Mitral deceleration time       (H)     232   ms     150 - 230  Mitral E/A ratio, peak                 0.9          ----------    Right atrium                            Value        Reference  RA ID, S-I, ES, A4C            (H)     51.3  mm     34 - 49  RA area, ES, A4C                       16    cm^2   8.3 - 19.5  RA volume, ES, A/L                     42.2  ml     ----------  RA volume/bsa, ES, A/L                 21.9  ml/m^2 ----------    Right ventricle                        Value        Reference  TAPSE                                  10.9  mm     ----------  RV s&', lateral, S                      7.34  cm/s   ----------  Legend: (L)  and  (H)  mark values outside specified reference range.  ------------------------------------------------------------------- Prepared and Electronically Authenticated by  Candee Furbish, M.D. 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

## 2017-07-15 ENCOUNTER — Encounter: Payer: Self-pay | Admitting: Cardiothoracic Surgery

## 2017-07-19 DIAGNOSIS — Z961 Presence of intraocular lens: Secondary | ICD-10-CM | POA: Diagnosis not present

## 2017-07-19 DIAGNOSIS — H26493 Other secondary cataract, bilateral: Secondary | ICD-10-CM | POA: Diagnosis not present

## 2017-07-19 DIAGNOSIS — H26491 Other secondary cataract, right eye: Secondary | ICD-10-CM | POA: Diagnosis not present

## 2017-07-21 ENCOUNTER — Encounter: Payer: Self-pay | Admitting: Cardiovascular Disease

## 2017-07-26 ENCOUNTER — Ambulatory Visit (INDEPENDENT_AMBULATORY_CARE_PROVIDER_SITE_OTHER): Payer: Medicare HMO | Admitting: *Deleted

## 2017-07-26 DIAGNOSIS — Z9889 Other specified postprocedural states: Secondary | ICD-10-CM | POA: Diagnosis not present

## 2017-07-26 DIAGNOSIS — I4891 Unspecified atrial fibrillation: Secondary | ICD-10-CM | POA: Diagnosis not present

## 2017-07-26 DIAGNOSIS — Z5181 Encounter for therapeutic drug level monitoring: Secondary | ICD-10-CM

## 2017-07-26 LAB — POCT INR: INR: 2.1

## 2017-07-26 NOTE — Patient Instructions (Signed)
Description   Continue taking 1 and 1/2 tablets daily except 2 tablets on Wednesdays and Fridays.  Recheck in 3 weeks.  Main #  [phone], [facility] # [phone] Amiodarone reduced to 100 mg daily on 06/06/2017  Call us when scheduled for Cardiac CT

## 2017-08-02 ENCOUNTER — Encounter: Payer: Self-pay | Admitting: Cardiothoracic Surgery

## 2017-08-05 ENCOUNTER — Telehealth (HOSPITAL_COMMUNITY): Payer: Self-pay

## 2017-08-08 ENCOUNTER — Other Ambulatory Visit: Payer: Self-pay | Admitting: Physician Assistant

## 2017-08-08 NOTE — Telephone Encounter (Signed)
Pam,  Please clarify with Dr. Johnsie Cancel - looks like in his note he wanted to stop the amiodarone - do not see this in the instructions.   Would defer to his discretion.   Thanks Cecille Rubin

## 2017-08-08 NOTE — Telephone Encounter (Signed)
In MyChart message on 07/06/17, Per Dr. Johnsie Cancel, would stop amiodarone and coumadin 3 months from Surgery if no further afib. December 30th was 3 months. Last EKG was NSR 06/06/17. Will forward to Dr. Johnsie Cancel for advisement.

## 2017-08-08 NOTE — Addendum Note (Signed)
Addended by: Angus Seller A on: 08/08/2017 01:29 PM   Modules accepted: Orders

## 2017-08-08 NOTE — Telephone Encounter (Signed)
Cardiac Rehab Medication Review by a Pharmacist  Does the patient  feel that his/her medications are working for him/her?  yes  Has the patient been experiencing any side effects to the medications prescribed?  no  Does the patient measure his/her own blood pressure or blood glucose at home?  yes   Does the patient have any problems obtaining medications due to transportation or finances?   no  Understanding of regimen: good Understanding of indications: fair Potential of compliance: good    Pharmacist comments: Patient is aware of all medications. When asked about his amiodarone dosing he reports that he does not think he has a bad heart. He reports taking his bp at home regularly with no issues.    [place] 08/08/2017 1:27 PM

## 2017-08-11 ENCOUNTER — Encounter (HOSPITAL_COMMUNITY)
Admission: RE | Admit: 2017-08-11 | Discharge: 2017-08-11 | Disposition: A | Discharge status: Home / Self Care | Payer: Medicare HMO | Source: Ambulatory Visit | Attending: Cardiovascular Disease | Admitting: Cardiovascular Disease

## 2017-08-11 ENCOUNTER — Encounter (HOSPITAL_COMMUNITY): Payer: Self-pay

## 2017-08-11 VITALS — BP 126/70 | HR 83 | Ht 67.25 in | Wt 180.1 lb

## 2017-08-11 DIAGNOSIS — Z87891 Personal history of nicotine dependence: Secondary | ICD-10-CM | POA: Insufficient documentation

## 2017-08-11 DIAGNOSIS — Z79899 Other long term (current) drug therapy: Secondary | ICD-10-CM | POA: Insufficient documentation

## 2017-08-11 DIAGNOSIS — Z8679 Personal history of other diseases of the circulatory system: Secondary | ICD-10-CM | POA: Insufficient documentation

## 2017-08-11 DIAGNOSIS — Z7982 Long term (current) use of aspirin: Secondary | ICD-10-CM | POA: Insufficient documentation

## 2017-08-11 DIAGNOSIS — Z7901 Long term (current) use of anticoagulants: Secondary | ICD-10-CM | POA: Insufficient documentation

## 2017-08-11 DIAGNOSIS — Z9889 Other specified postprocedural states: Secondary | ICD-10-CM | POA: Insufficient documentation

## 2017-08-11 DIAGNOSIS — I4891 Unspecified atrial fibrillation: Secondary | ICD-10-CM | POA: Insufficient documentation

## 2017-08-11 DIAGNOSIS — K219 Gastro-esophageal reflux disease without esophagitis: Secondary | ICD-10-CM | POA: Insufficient documentation

## 2017-08-11 DIAGNOSIS — E785 Hyperlipidemia, unspecified: Secondary | ICD-10-CM | POA: Insufficient documentation

## 2017-08-11 NOTE — Progress Notes (Signed)
Cardiac Individual Treatment Plan  Patient Details  Name: TRANQUILINO FISCHLER MRN: [id number] Date of Birth: 19-Sep-1940 Referring Provider:     [facility] from 08/11/2017 in [facility]  Referring Provider  Josue Hector, MD      Initial Encounter Date:    CARDIAC REHAB PHASE II ORIENTATION from 08/11/2017 in [facility]  Date  08/11/17  Referring Provider  Josue Hector, MD      Visit Diagnosis: H/O aortic dissection  S/P aortic valve repair 05/08/2017  Patient's Home Medications on Admission:  Current Outpatient Medications:  .  acetaminophen (TYLENOL) 325 MG tablet, Take 2 tablets (650 mg total) by mouth every 6 (six) hours as needed for mild pain., Disp: , Rfl:  .  amiodarone (PACERONE) 200 MG tablet, Take 0.5 tablets (100 mg total) by mouth daily. For one week then take Amiodarone 200 mg by mouth daily thereafter. (Patient taking differently: Take 100 mg by mouth daily. ), Disp: 30 tablet, Rfl: 1 .  aspirin 81 MG tablet, Take 1 tablet (81 mg total) by mouth daily., Disp: 30 tablet, Rfl:  .  desonide (DESOWEN) 0.05 % cream, Apply 1 application topically daily as needed (for facial flares). , Disp: , Rfl:  .  metoprolol tartrate (LOPRESSOR) 25 MG tablet, Take 0.5 tablets (12.5 mg total) by mouth 2 (two) times daily., Disp: 90 tablet, Rfl: 3 .  Misc Natural Products (GLUCOS-CHONDROIT-MSM COMPLEX) TABS, Take 1 tablet by mouth 3 (three) times daily., Disp: , Rfl:  .  simvastatin (ZOCOR) 20 MG tablet, TAKE 1 TABLET BY MOUTH AT BEDTIME, Disp: 90 tablet, Rfl: 1 .  warfarin (COUMADIN) 2.5 MG tablet, Take 1 tablet (2.5 mg total) by mouth as directed. Or as directed., Disp: 50 tablet, Rfl: 3  Past Medical History: Past Medical History:  Diagnosis Date  . 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 Arthritis   . Atrial fibrillation  ([place]) [I48.91] 05/23/2017  . Blood in the stool   . BRADYCARDIA 03/10/2010   Qualifier: Diagnosis of  By: Elease Hashimoto MD, Bruce    . CHEST PAIN 03/19/2010   Qualifier: Diagnosis of  By: Allean Found, LPN, Christine    . Chicken pox   . COLONIC POLYPS, HX OF 01/13/2009   Qualifier: Diagnosis of  By: Valma Cava LPN, Izora Gala    . Episode of generalized weakness 02/17/2016  . GERD (gastroesophageal reflux disease) 10/03/2012  . Hay fever   . History of colonic polyps   . History of colonoscopy   . Hyperlipidemia   . MUSCLE STRAIN, HAMSTRING MUSCLE 09/08/2009   Qualifier: Diagnosis of  By: Elease Hashimoto MD, Bruce    . ORTHOSTATIC DIZZINESS 03/05/2010   Qualifier: Diagnosis of  By: Elease Hashimoto MD, Bruce    . Other premature beats 03/17/2010   Qualifier: Diagnosis of  By: Lovena Le, MD, Martyn Malay   . Premature ventricular contraction 01/05/2011  . S/P aortic dissection repair 05/08/2017  . THROMBOCYTOPENIA 03/05/2010   Qualifier: Diagnosis of  By: Elease Hashimoto MD, Bruce    . Weakness 12/09/2015    Tobacco Use: Social History   Tobacco Use  Smoking Status Former Smoker  . Last attempt to quit: 08/09/1974  . Years since quitting: 43.0  Smokeless Tobacco Never Used    Labs: Recent Review [facility] for [facility] Cardiac and Pulmonary Rehab Latest Ref  Rng & Units 05/10/2017 05/10/2017 05/11/2017 05/11/2017 05/11/2017   Cholestrol 100 - 199 mg/dL - - - - -   LDLCALC 0 - 99 mg/dL - - - - -   HDL >39 mg/dL - - - - -   Trlycerides 0 - 149 mg/dL - - - - -   Hemoglobin A1c 4.8 - 5.6 % - - - - -   PHART 7.350 - 7.450 7.557(H) - - 7.512(H) 7.484(H)   PCO2ART 32.0 - 48.0 mmHg 32.2 - - 29.2(L) 31.0(L)   HCO3 20.0 - 28.0 mmol/L 28.8(H) - - 23.4 23.3   TCO2 22 - 32 mmol/L 30 - - 24 24   ACIDBASEDEF 0.0 - 2.0 mmol/L - - - - -   O2SAT % 100.0 59.5 68.7 97.0 97.0      Capillary Blood Glucose: Lab Results  Component Value Date   GLUCAP 122 (H) 05/16/2017   GLUCAP 128 (H) 05/14/2017   GLUCAP 112 (H) 05/13/2017    GLUCAP 165 (H) 05/13/2017   GLUCAP 145 (H) 05/13/2017     Exercise Target Goals: Date: 08/11/17  Exercise Program Goal: Individual exercise prescription set with THRR, safety & activity barriers. Participant demonstrates ability to understand and report RPE using BORG scale, to self-measure pulse accurately, and to acknowledge the importance of the exercise prescription.  Exercise Prescription Goal: Starting with aerobic activity 30 plus minutes a day, 3 days per week for initial exercise prescription. Provide home exercise prescription and guidelines that participant acknowledges understanding prior to discharge.  Activity Barriers & Risk Stratification: Activity Barriers & Cardiac Risk Stratification - 08/11/17 0945      Activity Barriers & Cardiac Risk Stratification   Activity Barriers  Deconditioning;Muscular Weakness;Other (comment)    Comments  L knee pain, occassionally    Cardiac Risk Stratification  High       6 Minute Walk: 6 Minute Walk    Row Name 08/11/17 1126         6 Minute Walk   Phase  Initial     Distance  1600 feet     Walk Time  6 minutes     # of Rest Breaks  0     MPH  3.03     METS  3.13     RPE  11     Perceived Dyspnea   0     VO2 Peak  10.94     Symptoms  No     Resting HR  83 bpm     Resting BP  126/70     Resting Oxygen Saturation   96 %     Exercise Oxygen Saturation  during 6 min walk  98 %     Max Ex. HR  101 bpm     Max Ex. BP  130/60     2 Minute Post BP  136/70        Oxygen Initial Assessment:   Oxygen Re-Evaluation:   Oxygen Discharge (Final Oxygen Re-Evaluation):   Initial Exercise Prescription: Initial Exercise Prescription - 08/11/17 1100      Date of Initial Exercise RX and Referring Provider   Date  08/11/17    Referring Provider  Josue Hector, MD      Bike   Level  0.6    Minutes  10    METs  2.42      NuStep   Level  2    SPM  70    Minutes  10  METs  2.5      Track   Laps  11    Minutes   10    METs  2.91      Prescription Details   Frequency (times per week)  3x    Duration  Progress to 30 minutes of continuous aerobic without signs/symptoms of physical distress      Intensity   THRR 40-80% of Max Heartrate  58-116    Ratings of Perceived Exertion  11-15    Perceived Dyspnea  0-4      Progression   Progression  Continue progressive overload as per policy without signs/symptoms or physical distress.      Resistance Training   Training Prescription  Yes    Weight  2lbs    Reps  10-15       Perform Capillary Blood Glucose checks as needed.  Exercise Prescription Changes:   Exercise Comments:   Exercise Goals and Review:  Exercise Goals    Row Name 08/11/17 0946             Exercise Goals   Increase Physical Activity  Yes       Intervention  Provide advice, education, support and counseling about physical activity/exercise needs.;Develop an individualized exercise prescription for aerobic and resistive training based on initial evaluation findings, risk stratification, comorbidities and participant's personal goals.       Expected Outcomes  Achievement of increased cardiorespiratory fitness and enhanced flexibility, muscular endurance and strength shown through measurements of functional capacity and personal statement of participant.       Increase Strength and Stamina  Yes       Intervention  Provide advice, education, support and counseling about physical activity/exercise needs.;Develop an individualized exercise prescription for aerobic and resistive training based on initial evaluation findings, risk stratification, comorbidities and participant's personal goals.       Expected Outcomes  Achievement of increased cardiorespiratory fitness and enhanced flexibility, muscular endurance and strength shown through measurements of functional capacity and personal statement of participant.       Able to understand and use rate of perceived exertion (RPE) scale   Yes       Intervention  Provide education and explanation on how to use RPE scale       Expected Outcomes  Short Term: Able to use RPE daily in rehab to express subjective intensity level;Long Term:  Able to use RPE to guide intensity level when exercising independently       Knowledge and understanding of Target Heart Rate Range (THRR)  Yes       Intervention  Provide education and explanation of THRR including how the numbers were predicted and where they are located for reference       Expected Outcomes  Short Term: Able to state/look up THRR;Short Term: Able to use daily as guideline for intensity in rehab;Long Term: Able to use THRR to govern intensity when exercising independently       Able to check pulse independently  Yes       Intervention  Provide education and demonstration on how to check pulse in carotid and radial arteries.;Review the importance of being able to check your own pulse for safety during independent exercise       Expected Outcomes  Short Term: Able to explain why pulse checking is important during independent exercise;Long Term: Able to check pulse independently and accurately       Understanding of Exercise Prescription  Yes  Intervention  Provide education, explanation, and written materials on patient's individual exercise prescription       Expected Outcomes  Short Term: Able to explain program exercise prescription;Long Term: Able to explain home exercise prescription to exercise independently          Exercise Goals Re-Evaluation :    Discharge Exercise Prescription (Final Exercise Prescription Changes):   Nutrition:  Target Goals: Understanding of nutrition guidelines, daily intake of sodium 1500mg , cholesterol 200mg , calories 30% from fat and 7% or less from saturated fats, daily to have 5 or more servings of fruits and vegetables.  Biometrics: Pre Biometrics - 08/11/17 1132      Pre Biometrics   Height  5' 7.25" (1.708 m)    Weight  180 lb  1.9 oz (81.7 kg)    Waist Circumference  38.25 inches    Hip Circumference  40.25 inches    Waist to Hip Ratio  0.95 %    BMI (Calculated)  28.01    Triceps Skinfold  20 mm    % Body Fat  28.3 %    Grip Strength  46 kg    Flexibility  15 in    Single Leg Stand  13.1 seconds        Nutrition Therapy Plan and Nutrition Goals:   Nutrition Discharge: Nutrition Scores:   Nutrition Goals Re-Evaluation:   Nutrition Goals Re-Evaluation:   Nutrition Goals Discharge (Final Nutrition Goals Re-Evaluation):   Psychosocial: Target Goals: Acknowledge presence or absence of significant depression and/or stress, maximize coping skills, provide positive support system. Participant is able to verbalize types and ability to use techniques and skills needed for reducing stress and depression.  Initial Review & Psychosocial Screening: Initial Psych Review & Screening - 08/11/17 1246      Initial Review   Current issues with  None Identified      Family Dynamics   Good Support System?  Yes Rush Landmark has his wife and daughter for support      Barriers   Psychosocial barriers to participate in program  There are no identifiable barriers or psychosocial needs.      Screening Interventions   Interventions  Encouraged to exercise       Quality of Life Scores: Quality of Life - 08/11/17 1134      Quality of Life Scores   Health/Function Pre  27.93 %    Socioeconomic Pre  29.58 %    Psych/Spiritual Pre  30 %    Family Pre  30 %    GLOBAL Pre  29.98 %       PHQ-9: Recent Review Flowsheet Data    Depression screen [facility] 2/9 08/30/2013   Decreased Interest 0   Down, Depressed, Hopeless 0   PHQ - 2 Score 0     Interpretation of Total Score  Total Score Depression Severity:  1-4 = Minimal depression, 5-9 = Mild depression, 10-14 = Moderate depression, 15-19 = Moderately severe depression, 20-27 = Severe depression   Psychosocial Evaluation and Intervention:   Psychosocial  Re-Evaluation:   Psychosocial Discharge (Final Psychosocial Re-Evaluation):   Vocational Rehabilitation: Provide vocational rehab assistance to qualifying candidates.   Vocational Rehab Evaluation & Intervention: Vocational Rehab - 08/11/17 1245      Initial Vocational Rehab Evaluation & Intervention   Assessment shows need for Vocational Rehabilitation  No       Education: Education Goals: Education classes will be provided on a weekly basis, covering required topics. Participant will state understanding/return demonstration  of topics presented.  Learning Barriers/Preferences: Learning Barriers/Preferences - 08/10/17 1121      Learning Barriers/Preferences   Learning Barriers  Sight;Hearing    Learning Preferences  Verbal Instruction;Group Instruction;Audio;Written Material;Video       Education Topics: Count Your Pulse:  -Group instruction provided by verbal instruction, demonstration, patient participation and written materials to support subject.  Instructors address importance of being able to find your pulse and how to count your pulse when at home without a heart monitor.  Patients get hands on experience counting their pulse with staff help and individually.   Heart Attack, Angina, and Risk Factor Modification:  -Group instruction provided by verbal instruction, video, and written materials to support subject.  Instructors address signs and symptoms of angina and heart attacks.    Also discuss risk factors for heart disease and how to make changes to improve heart health risk factors.   Functional Fitness:  -Group instruction provided by verbal instruction, demonstration, patient participation, and written materials to support subject.  Instructors address safety measures for doing things around the house.  Discuss how to get up and down off the floor, how to pick things up properly, how to safely get out of a chair without assistance, and balance  training.   Meditation and Mindfulness:  -Group instruction provided by verbal instruction, patient participation, and written materials to support subject.  Instructor addresses importance of mindfulness and meditation practice to help reduce stress and improve awareness.  Instructor also leads participants through a meditation exercise.    Stretching for Flexibility and Mobility:  -Group instruction provided by verbal instruction, patient participation, and written materials to support subject.  Instructors lead participants through series of stretches that are designed to increase flexibility thus improving mobility.  These stretches are additional exercise for major muscle groups that are typically performed during regular warm up and cool down.   Hands Only CPR:  -Group verbal, video, and participation provides a basic overview of AHA guidelines for community CPR. Role-play of emergencies allow participants the opportunity to practice calling for help and chest compression technique with discussion of AED use.   Hypertension: -Group verbal and written instruction that provides a basic overview of hypertension including the most recent diagnostic guidelines, risk factor reduction with self-care instructions and medication management.    Nutrition I class: Heart Healthy Eating:  -Group instruction provided by PowerPoint slides, verbal discussion, and written materials to support subject matter. The instructor gives an explanation and review of the Therapeutic Lifestyle Changes diet recommendations, which includes a discussion on lipid goals, dietary fat, sodium, fiber, plant stanol/sterol esters, sugar, and the components of a well-balanced, healthy diet.   Nutrition II class: Lifestyle Skills:  -Group instruction provided by PowerPoint slides, verbal discussion, and written materials to support subject matter. The instructor gives an explanation and review of label reading, grocery  shopping for heart health, heart healthy recipe modifications, and ways to make healthier choices when eating out.   Diabetes Question & Answer:  -Group instruction provided by PowerPoint slides, verbal discussion, and written materials to support subject matter. The instructor gives an explanation and review of diabetes co-morbidities, pre- and post-prandial blood glucose goals, pre-exercise blood glucose goals, signs, symptoms, and treatment of hypoglycemia and hyperglycemia, and foot care basics.   Diabetes Blitz:  -Group instruction provided by PowerPoint slides, verbal discussion, and written materials to support subject matter. The instructor gives an explanation and review of the physiology behind type 1 and type 2 diabetes, diabetes  medications and rational behind using different medications, pre- and post-prandial blood glucose recommendations and Hemoglobin A1c goals, diabetes diet, and exercise including blood glucose guidelines for exercising safely.    Portion Distortion:  -Group instruction provided by PowerPoint slides, verbal discussion, written materials, and food models to support subject matter. The instructor gives an explanation of serving size versus portion size, changes in portions sizes over the last 20 years, and what consists of a serving from each food group.   Stress Management:  -Group instruction provided by verbal instruction, video, and written materials to support subject matter.  Instructors review role of stress in heart disease and how to cope with stress positively.     Exercising on Your Own:  -Group instruction provided by verbal instruction, power point, and written materials to support subject.  Instructors discuss benefits of exercise, components of exercise, frequency and intensity of exercise, and end points for exercise.  Also discuss use of nitroglycerin and activating EMS.  Review options of places to exercise outside of rehab.  Review guidelines  for sex with heart disease.   Cardiac Drugs I:  -Group instruction provided by verbal instruction and written materials to support subject.  Instructor reviews cardiac drug classes: antiplatelets, anticoagulants, beta blockers, and statins.  Instructor discusses reasons, side effects, and lifestyle considerations for each drug class.   Cardiac Drugs II:  -Group instruction provided by verbal instruction and written materials to support subject.  Instructor reviews cardiac drug classes: angiotensin converting enzyme inhibitors (ACE-I), angiotensin II receptor blockers (ARBs), nitrates, and calcium channel blockers.  Instructor discusses reasons, side effects, and lifestyle considerations for each drug class.   Anatomy and Physiology of the Circulatory System:  Group verbal and written instruction and models provide basic cardiac anatomy and physiology, with the coronary electrical and arterial systems. Review of: AMI, Angina, Valve disease, Heart Failure, Peripheral Artery Disease, Cardiac Arrhythmia, Pacemakers, and the ICD.   Other Education:  -Group or individual verbal, written, or video instructions that support the educational goals of the cardiac rehab program.   Knowledge Questionnaire Score: Knowledge Questionnaire Score - 08/11/17 1134      Knowledge Questionnaire Score   Pre Score  22/24       Core Components/Risk Factors/Patient Goals at Admission: Personal Goals and Risk Factors at Admission - 08/11/17 1243      Core Components/Risk Factors/Patient Goals on Admission    Weight Management  Yes;Weight Loss    Intervention  Weight Management: Develop a combined nutrition and exercise program designed to reach desired caloric intake, while maintaining appropriate intake of nutrient and fiber, sodium and fats, and appropriate energy expenditure required for the weight goal.;Weight Management: Provide education and appropriate resources to help participant work on and attain  dietary goals.;Weight Management/Obesity: Establish reasonable short term and long term weight goals.;Obesity: Provide education and appropriate resources to help participant work on and attain dietary goals.    Admit Weight  191 lb 12.8 oz (87 kg)    Goal Weight: Short Term  187 lb (84.8 kg)    Goal Weight: Long Term  181 lb (82.1 kg)    Expected Outcomes  Short Term: Continue to assess and modify interventions until short term weight is achieved;Long Term: Adherence to nutrition and physical activity/exercise program aimed toward attainment of established weight goal;Weight Loss: Understanding of general recommendations for a balanced deficit meal plan, which promotes 1-2 lb weight loss per week and includes a negative energy balance of [phone] kcal/d;Understanding recommendations for meals to  include 15-35% energy as protein, 25-35% energy from fat, 35-60% energy from carbohydrates, less than 200mg  of dietary cholesterol, 20-35 gm of total fiber daily;Understanding of distribution of calorie intake throughout the day with the consumption of 4-5 meals/snacks    Lipids  Yes    Intervention  Provide education and support for participant on nutrition & aerobic/resistive exercise along with prescribed medications to achieve LDL 70mg , HDL >40mg .    Expected Outcomes  Short Term: Participant states understanding of desired cholesterol values and is compliant with medications prescribed. Participant is following exercise prescription and nutrition guidelines.;Long Term: Cholesterol controlled with medications as prescribed, with individualized exercise RX and with personalized nutrition plan. Value goals: LDL < 70mg , HDL > 40 mg.       Core Components/Risk Factors/Patient Goals Review:    Core Components/Risk Factors/Patient Goals at Discharge (Final Review):    ITP Comments: ITP Comments    Row Name 08/11/17 0943           ITP Comments  Dr. Fransico Him, Medical Director          Comments:  Rush Landmark attended orientation from [phone] to 1100 to review rules and guidelines for program. Completed 6 minute walk test, Intitial ITP, and exercise prescription.  VSS. Telemetry-Sinus Rhtyhm.  Asymptomatic.Barnet Pall, RN,BSN 08/11/2017 1:03 PM

## 2017-08-12 ENCOUNTER — Other Ambulatory Visit: Payer: Self-pay | Admitting: Cardiovascular Disease

## 2017-08-12 NOTE — Progress Notes (Signed)
Don Medina 77 y.o. male DOB: 09-Apr-1941 MRN: [id number]      Nutrition Note  1. H/O aortic dissection   2. S/P aortic valve repair 05/08/2017    Past Medical History:  Diagnosis Date  . 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 Arthritis   . Atrial fibrillation ([place]) [I48.91] 05/23/2017  . Blood in the stool   . BRADYCARDIA 03/10/2010   Qualifier: Diagnosis of  By: Elease Hashimoto MD, Bruce    . CHEST PAIN 03/19/2010   Qualifier: Diagnosis of  By: Allean Found, LPN, Christine    . Chicken pox   . COLONIC POLYPS, HX OF 01/13/2009   Qualifier: Diagnosis of  By: Valma Cava LPN, Izora Gala    . Episode of generalized weakness 02/17/2016  . GERD (gastroesophageal reflux disease) 10/03/2012  . Hay fever   . History of colonic polyps   . History of colonoscopy   . Hyperlipidemia   . MUSCLE STRAIN, HAMSTRING MUSCLE 09/08/2009   Qualifier: Diagnosis of  By: Elease Hashimoto MD, Bruce    . ORTHOSTATIC DIZZINESS 03/05/2010   Qualifier: Diagnosis of  By: Elease Hashimoto MD, Bruce    . Other premature beats 03/17/2010   Qualifier: Diagnosis of  By: Lovena Le, MD, Martyn Malay   . Premature ventricular contraction 01/05/2011  . S/P aortic dissection repair 05/08/2017  . THROMBOCYTOPENIA 03/05/2010   Qualifier: Diagnosis of  By: Elease Hashimoto MD, Bruce    . Weakness 12/09/2015   Meds reviewed.   HT: Ht Readings from Last 1 Encounters:  08/11/17 5' 7.25" (1.708 m)    WT: Wt Readings from Last 3 Encounters:  08/11/17 180 lb 1.9 oz (81.7 kg)  07/14/17 174 lb (78.9 kg)  06/27/17 171 lb 4 oz (77.7 kg)     BMI 26.5   Current tobacco use? No   Labs:  Lipid Panel     Component Value Date/Time   CHOL 128 12/09/2015 0941   TRIG 58 12/09/2015 0941   HDL 60 12/09/2015 0941   CHOLHDL 2.1 12/09/2015 0941   CHOLHDL 2 08/23/2013 0914   VLDL 9.6 08/23/2013 0914   LDLCALC 56 12/09/2015 0941    Lab Results  Component Value Date   HGBA1C 6.1 (H)  12/09/2015   CBG (last 3)  No results for input(s): GLUCAP in the last 72 hours.  Nutrition Note Spoke with pt. Nutrition plan and goals reviewed with pt. Pt is not following a heart healthy diet at this time. Per discussion, pt states "all of my arteries were clear so I must be doing something right." Pt is pre-diabetic and is aware of pre-diabetes dx. Per pt, "my a1c has been the same for 10 years or so." Pre-diabetes briefly discussed. Pt expressed understanding of the information reviewed. Pt aware of nutrition education classes offered and does not want to attend nutrition classes at this time.  Nutrition Diagnosis ? Food-and nutrition-related knowledge deficit related to lack of exposure to information as related to diagnosis of: ? CVD ? Pre-DM  Nutrition Intervention ? Pt's individual nutrition plan and goals reviewed with pt. ? Pt given handouts for: ? Nutrition I class ? Nutrition II class   Nutrition Goal(s):  ? Pt to describe the benefit of including fruits, vegetables, whole grains, and low-fat dairy products in a heart healthy meal plan.  Plan:  Pt to attend nutrition classes ? Portion Distortion  Will provide client-centered nutrition education as part of interdisciplinary  care.   Monitor and evaluate progress toward nutrition goal with team.  Derek Mound, M.Ed, RD, LDN, CDE 08/12/2017 10:39 AM

## 2017-08-12 NOTE — Telephone Encounter (Signed)
In MyChart message on 07/06/17, Per Dr. Johnsie Cancel, would stop amiodarone and coumadin 3 months from Surgery if no further afib. December 30th was 3 months. Last EKG was NSR 06/06/17. Dr. Johnsie Cancel refused last refill. Called patient and explained to him and his wife that he could stop amiodarone.

## 2017-08-12 NOTE — Telephone Encounter (Signed)
Pt's wife requesting a refill on amiodarone 200 mg tablet. Wife has questions concerning this medication, would like a call back. Please address

## 2017-08-15 ENCOUNTER — Encounter (HOSPITAL_COMMUNITY)
Admission: RE | Admit: 2017-08-15 | Discharge: 2017-08-15 | Disposition: A | Discharge status: Home / Self Care | Payer: Medicare HMO | Source: Ambulatory Visit | Attending: Cardiovascular Disease | Admitting: Cardiovascular Disease

## 2017-08-15 ENCOUNTER — Telehealth (HOSPITAL_COMMUNITY): Payer: Self-pay | Admitting: *Deleted

## 2017-08-15 DIAGNOSIS — Z87891 Personal history of nicotine dependence: Secondary | ICD-10-CM | POA: Diagnosis not present

## 2017-08-15 DIAGNOSIS — Z7982 Long term (current) use of aspirin: Secondary | ICD-10-CM | POA: Diagnosis not present

## 2017-08-15 DIAGNOSIS — K219 Gastro-esophageal reflux disease without esophagitis: Secondary | ICD-10-CM | POA: Diagnosis not present

## 2017-08-15 DIAGNOSIS — Z9889 Other specified postprocedural states: Secondary | ICD-10-CM | POA: Diagnosis not present

## 2017-08-15 DIAGNOSIS — Z7901 Long term (current) use of anticoagulants: Secondary | ICD-10-CM | POA: Diagnosis not present

## 2017-08-15 DIAGNOSIS — I4891 Unspecified atrial fibrillation: Secondary | ICD-10-CM | POA: Diagnosis not present

## 2017-08-15 DIAGNOSIS — E785 Hyperlipidemia, unspecified: Secondary | ICD-10-CM | POA: Diagnosis not present

## 2017-08-15 DIAGNOSIS — Z8679 Personal history of other diseases of the circulatory system: Secondary | ICD-10-CM

## 2017-08-15 DIAGNOSIS — Z79899 Other long term (current) drug therapy: Secondary | ICD-10-CM | POA: Diagnosis not present

## 2017-08-15 NOTE — Progress Notes (Signed)
Daily Session Note  Patient Details  Name: Don Medina MRN: [id number] Date of Birth: 08/18/1940 Referring Provider:     [place] from 08/11/2017 in [place]  Referring Provider  Josue Hector, MD      Encounter Date: 08/15/2017  Check In: Session Check In - 08/15/17 1017      Check-In   Location  [facility]    Staff Present  Dorma Russell, MS,ACSM CEP, Exercise Physiologist;Carlette Wilber Oliphant, RN, Marga Melnick, RN, BSN;Joann Rion, RN, BSN    Supervising physician immediately available to respond to emergencies  [facility] immediately available    Physician(s)  Dr.Alekah    Medication changes reported      No    Fall or balance concerns reported     No    Tobacco Cessation  No Change    Warm-up and Cool-down  Performed as group-led instruction    Resistance Training Performed  Yes    VAD Patient?  No      Pain Assessment   Currently in Pain?  No/denies       Capillary Blood Glucose: No results found for this or any previous visit (from the past 24 hour(s)).    Social History   Tobacco Use  Smoking Status Former Smoker  . Last attempt to quit: 08/09/1974  . Years since quitting: 43.0  Smokeless Tobacco Never Used    Goals Met:  Exercise tolerated well  Goals Unmet:  Not Applicable  Comments: Pt started cardiac rehab today.  Pt tolerated light exercise without difficulty. VSS, telemetry-sinus rhythm, asymptomatic.  Medication list reconciled. Pt denies barriers to medicaiton compliance.  PSYCHOSOCIAL ASSESSMENT:  PHQ-0. Pt exhibits positive coping skills, hopeful outlook with supportive family. No psychosocial needs identified at this time, no psychosocial interventions necessary.    Pt enjoys going to the gym and doing yard work.   Pt oriented to exercise equipment and routine.    Understanding verbalized.Barnet Pall, RN,BSN 08/15/2017 4:29 PM   Dr. Fransico Him is  Medical Director for Cardiac Rehab at [facility].

## 2017-08-16 ENCOUNTER — Ambulatory Visit (INDEPENDENT_AMBULATORY_CARE_PROVIDER_SITE_OTHER): Payer: Medicare HMO | Admitting: *Deleted

## 2017-08-16 DIAGNOSIS — Z9889 Other specified postprocedural states: Secondary | ICD-10-CM | POA: Diagnosis not present

## 2017-08-16 DIAGNOSIS — Z5181 Encounter for therapeutic drug level monitoring: Secondary | ICD-10-CM | POA: Diagnosis not present

## 2017-08-16 DIAGNOSIS — I4891 Unspecified atrial fibrillation: Secondary | ICD-10-CM

## 2017-08-16 LAB — POCT INR: INR: 1.8

## 2017-08-16 NOTE — Patient Instructions (Signed)
Description   Today take 2 tablets then continue taking 1 and 1/2 tablets daily except 2 tablets on Wednesdays and Fridays.  Recheck in 2 weeks.  Main #  [phone], [facility] # [phone] Amiodarone d/c on 08/10/17  Call us when scheduled for Cardiac CT

## 2017-08-17 ENCOUNTER — Encounter (HOSPITAL_COMMUNITY): Payer: Medicare HMO

## 2017-08-18 ENCOUNTER — Encounter: Payer: Self-pay | Admitting: Cardiothoracic Surgery

## 2017-08-19 ENCOUNTER — Encounter (HOSPITAL_COMMUNITY): Payer: Medicare HMO

## 2017-08-22 ENCOUNTER — Encounter (HOSPITAL_COMMUNITY): Payer: Medicare HMO

## 2017-08-24 ENCOUNTER — Encounter (HOSPITAL_COMMUNITY): Payer: Medicare HMO

## 2017-08-25 ENCOUNTER — Telehealth: Payer: Self-pay | Admitting: Family Medicine

## 2017-08-25 NOTE — Telephone Encounter (Signed)
Copied from [place] [id number]. Topic: General - Other >> Aug 25, 2017 10:42 AM Carolyn Stare wrote:  Pt said the below med was discontinued because the pharmacy said since he was taking AMIORARONE along with the below would cause a conflict. Pt is now not taking the med and is asking if he can go back on   CITALPRAM   Pt phone number [place] [address]

## 2017-08-26 ENCOUNTER — Encounter (HOSPITAL_COMMUNITY): Payer: Medicare HMO

## 2017-08-26 MED ORDER — CITALOPRAM HYDROBROMIDE 20 MG PO TABS: 20.0000 mg | ORAL_TABLET | Freq: Every day | ORAL | 5 refills | Status: DC

## 2017-08-26 NOTE — Telephone Encounter (Signed)
New Rx sent and patient is aware. 

## 2017-08-26 NOTE — Telephone Encounter (Signed)
If off Amiodarone, may start Celexa 20 mg po qd #30 with 5 refills.

## 2017-08-29 ENCOUNTER — Encounter (HOSPITAL_COMMUNITY): Payer: Medicare HMO

## 2017-08-30 ENCOUNTER — Ambulatory Visit (INDEPENDENT_AMBULATORY_CARE_PROVIDER_SITE_OTHER): Payer: Medicare HMO | Admitting: *Deleted

## 2017-08-30 DIAGNOSIS — Z5181 Encounter for therapeutic drug level monitoring: Secondary | ICD-10-CM

## 2017-08-30 DIAGNOSIS — Z9889 Other specified postprocedural states: Secondary | ICD-10-CM | POA: Diagnosis not present

## 2017-08-30 DIAGNOSIS — I4891 Unspecified atrial fibrillation: Secondary | ICD-10-CM

## 2017-08-30 LAB — POCT INR: INR: 1.7

## 2017-08-30 NOTE — Patient Instructions (Signed)
Description   Today Jan 22nd  take 2 tablets then change dose of coumadin to  1 and 1/2 tablets daily except 2 tablets on Mondays , Wednesdays and Fridays.  Recheck in 2 weeks.  Main #  [phone], [facility] # [phone] Amiodarone d/c on 08/10/17  Call with any change in medications or scheduled for any procedures that you would need to hold coumadin

## 2017-08-31 ENCOUNTER — Encounter (HOSPITAL_COMMUNITY): Payer: Medicare HMO

## 2017-09-02 ENCOUNTER — Encounter (HOSPITAL_COMMUNITY): Payer: Medicare HMO

## 2017-09-05 ENCOUNTER — Encounter (HOSPITAL_COMMUNITY): Payer: Medicare HMO

## 2017-09-07 ENCOUNTER — Encounter (HOSPITAL_COMMUNITY): Payer: Medicare HMO

## 2017-09-09 ENCOUNTER — Encounter (HOSPITAL_COMMUNITY): Payer: Medicare HMO

## 2017-09-12 ENCOUNTER — Encounter (HOSPITAL_COMMUNITY): Payer: Medicare HMO

## 2017-09-13 ENCOUNTER — Ambulatory Visit (INDEPENDENT_AMBULATORY_CARE_PROVIDER_SITE_OTHER): Payer: Medicare HMO | Admitting: *Deleted

## 2017-09-13 DIAGNOSIS — I4891 Unspecified atrial fibrillation: Secondary | ICD-10-CM | POA: Diagnosis not present

## 2017-09-13 DIAGNOSIS — Z5181 Encounter for therapeutic drug level monitoring: Secondary | ICD-10-CM

## 2017-09-13 DIAGNOSIS — Z9889 Other specified postprocedural states: Secondary | ICD-10-CM | POA: Diagnosis not present

## 2017-09-13 LAB — POCT INR: INR: 1.8

## 2017-09-13 NOTE — Patient Instructions (Addendum)
  Description   Today Feb 5th   take 2 tablets then change dose of coumadin to  2 tablets daily except 1 and 1/2 tablets onTuesdays Thursdays and Saturdays  Recheck in 2 weeks.  Main #  [phone], [facility] # [phone] Amiodarone d/c on 08/10/17  Call with any change in medications or scheduled for any procedures that you would need to hold coumadin

## 2017-09-14 ENCOUNTER — Encounter (HOSPITAL_COMMUNITY): Payer: Medicare HMO

## 2017-09-16 ENCOUNTER — Encounter (HOSPITAL_COMMUNITY): Payer: Medicare HMO

## 2017-09-17 ENCOUNTER — Other Ambulatory Visit: Payer: Self-pay

## 2017-09-17 ENCOUNTER — Emergency Department (HOSPITAL_COMMUNITY)
Admission: EM | Admit: 2017-09-17 | Discharge: 2017-09-17 | Disposition: A | Discharge status: Home / Self Care | Payer: Medicare HMO | Attending: Emergency Medicine | Admitting: Emergency Medicine

## 2017-09-17 ENCOUNTER — Emergency Department (HOSPITAL_COMMUNITY): Payer: Medicare HMO

## 2017-09-17 ENCOUNTER — Encounter: Payer: Self-pay | Admitting: Cardiovascular Disease

## 2017-09-17 ENCOUNTER — Encounter (HOSPITAL_COMMUNITY): Payer: Self-pay | Admitting: Emergency Medicine

## 2017-09-17 DIAGNOSIS — K219 Gastro-esophageal reflux disease without esophagitis: Secondary | ICD-10-CM

## 2017-09-17 DIAGNOSIS — Z87891 Personal history of nicotine dependence: Secondary | ICD-10-CM | POA: Diagnosis not present

## 2017-09-17 DIAGNOSIS — R109 Unspecified abdominal pain: Secondary | ICD-10-CM | POA: Diagnosis not present

## 2017-09-17 DIAGNOSIS — R0789 Other chest pain: Secondary | ICD-10-CM

## 2017-09-17 DIAGNOSIS — Z79899 Other long term (current) drug therapy: Secondary | ICD-10-CM | POA: Insufficient documentation

## 2017-09-17 DIAGNOSIS — I4891 Unspecified atrial fibrillation: Secondary | ICD-10-CM | POA: Insufficient documentation

## 2017-09-17 DIAGNOSIS — Z7901 Long term (current) use of anticoagulants: Secondary | ICD-10-CM | POA: Insufficient documentation

## 2017-09-17 DIAGNOSIS — Z7982 Long term (current) use of aspirin: Secondary | ICD-10-CM | POA: Insufficient documentation

## 2017-09-17 DIAGNOSIS — R079 Chest pain, unspecified: Secondary | ICD-10-CM | POA: Diagnosis not present

## 2017-09-17 LAB — I-STAT TROPONIN, ED
Troponin i, poc: 0 ng/mL (ref 0.00–0.08)
Troponin i, poc: 0.01 ng/mL (ref 0.00–0.08)

## 2017-09-17 LAB — BASIC METABOLIC PANEL
ANION GAP: 11 (ref 5–15)
BUN: 15 mg/dL (ref 6–20)
CHLORIDE: 100 mmol/L — AB (ref 101–111)
CO2: 25 mmol/L (ref 22–32)
Calcium: 8.9 mg/dL (ref 8.9–10.3)
Creatinine, Ser: 1.15 mg/dL (ref 0.61–1.24)
GFR calc non Af Amer: >60 mL/min (ref >60)
GLUCOSE: 102 mg/dL — AB (ref 65–99)
Potassium: 4.2 mmol/L (ref 3.5–5.1)
Sodium: 136 mmol/L (ref 135–145)

## 2017-09-17 LAB — CBC
HCT: 43.2 % (ref 39.0–52.0)
HEMOGLOBIN: 14.1 g/dL (ref 13.0–17.0)
MCH: 28.6 pg (ref 26.0–34.0)
MCHC: 32.6 g/dL (ref 30.0–36.0)
MCV: 87.6 fL (ref 78.0–100.0)
Platelets: 171 10*3/uL (ref 150–400)
RBC: 4.93 MIL/uL (ref 4.22–5.81)
RDW: 14.4 % (ref 11.5–15.5)
WBC: 7.8 10*3/uL (ref 4.0–10.5)

## 2017-09-17 LAB — PROTIME-INR
INR: 1.6
Prothrombin Time: 18.9 seconds — ABNORMAL HIGH (ref 11.4–15.2)

## 2017-09-17 MED ORDER — LIDOCAINE VISCOUS 2 % MT SOLN
15.0000 mL | Freq: Once | OROMUCOSAL | Status: AC
  Administered 2017-09-17: 15 mL via OROMUCOSAL
  Filled 2017-09-17: qty 15

## 2017-09-17 MED ORDER — ALUM & MAG HYDROXIDE-SIMETH 200-200-20 MG/5ML PO SUSP
30.0000 mL | Freq: Once | ORAL | Status: AC
  Administered 2017-09-17: 30 mL via ORAL
  Filled 2017-09-17: qty 30

## 2017-09-17 MED ORDER — PANTOPRAZOLE SODIUM 40 MG IV SOLR
40.0000 mg | Freq: Once | INTRAVENOUS | Status: AC
  Administered 2017-09-17: 40 mg via INTRAVENOUS
  Filled 2017-09-17: qty 40

## 2017-09-17 MED ORDER — IOPAMIDOL (ISOVUE-370) INJECTION 76%
INTRAVENOUS | Status: AC
  Administered 2017-09-17: 100 mL
  Filled 2017-09-17: qty 100

## 2017-09-17 MED ORDER — PANTOPRAZOLE SODIUM 40 MG PO TBEC: 40.0000 mg | DELAYED_RELEASE_TABLET | Freq: Every day | ORAL | 0 refills | Status: DC

## 2017-09-17 MED ORDER — NITROGLYCERIN 0.4 MG SL SUBL
0.4000 mg | SUBLINGUAL_TABLET | SUBLINGUAL | Status: AC | PRN
  Administered 2017-09-17 (×3): 0.4 mg via SUBLINGUAL
  Filled 2017-09-17: qty 1

## 2017-09-17 NOTE — ED Notes (Signed)
Pt ambulated to bathroom with steady gait. 

## 2017-09-17 NOTE — ED Notes (Signed)
Patient currently at CT scan .  

## 2017-09-17 NOTE — Discharge Instructions (Addendum)
Your labs today were reassuring.  You had 2 normal sets of cardiac labs.  Your CT scan showed no acute abnormality.  Please follow-up closely with your primary care physician for monitoring of your blood pressure.  You may take Protonix as needed for heartburn at home.  You may also use over-the-counter Maalox.  Maalox is one of the ingredients in a GI cocktail.  Your INR (Coumadin level) was low at 1.6.  Please continue your Coumadin and contact your primary care physician for further recommendations.

## 2017-09-17 NOTE — ED Provider Notes (Signed)
TIME SEEN: 5:31 AM  CHIEF COMPLAINT: Chest pain  HPI: Patient is a 77 year old male with history of type a aortic dissection status post repair on 05/08/17 by Dr. Scot Dock and Dr. Servando Snare, atrial fibrillation on Coumadin, hyperlipidemia who presents to the emergency department with chest pain.  States the chest pain is in the center of his chest without radiation awoken from sleep at 2 AM.  No alleviating factors.  States it feels "like heartburn".  Pain worse with deep inspiration.  No shortness of breath, nausea, vomiting, diaphoresis, dizziness, fever, cough, lower extremity swelling or pain.  Had a cardiac CT scan on 06/29/17 that showed no significant coronary artery disease.  ROS: See HPI Constitutional: no fever  Eyes: no drainage  ENT: no runny nose   Cardiovascular:   chest pain  Resp: no SOB  GI: no vomiting GU: no dysuria Integumentary: no rash  Allergy: no hives  Musculoskeletal: no leg swelling  Neurological: no slurred speech ROS otherwise negative  PAST MEDICAL HISTORY/PAST SURGICAL HISTORY:  Past Medical History:  Diagnosis Date  . 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 Arthritis   . Atrial fibrillation ([place]) [I48.91] 05/23/2017  . Blood in the stool   . BRADYCARDIA 03/10/2010   Qualifier: Diagnosis of  By: Elease Hashimoto MD, Bruce    . CHEST PAIN 03/19/2010   Qualifier: Diagnosis of  By: Allean Found, LPN, Christine    . Chicken pox   . COLONIC POLYPS, HX OF 01/13/2009   Qualifier: Diagnosis of  By: Valma Cava LPN, Izora Gala    . Episode of generalized weakness 02/17/2016  . GERD (gastroesophageal reflux disease) 10/03/2012  . Hay fever   . History of colonic polyps   . History of colonoscopy   . Hyperlipidemia   . MUSCLE STRAIN, HAMSTRING MUSCLE 09/08/2009   Qualifier: Diagnosis of  By: Elease Hashimoto MD, Bruce    . ORTHOSTATIC DIZZINESS 03/05/2010   Qualifier: Diagnosis of  By: Elease Hashimoto MD, Bruce    . Other  premature beats 03/17/2010   Qualifier: Diagnosis of  By: Lovena Le, MD, Martyn Malay   . Premature ventricular contraction 01/05/2011  . S/P aortic dissection repair 05/08/2017  . THROMBOCYTOPENIA 03/05/2010   Qualifier: Diagnosis of  By: Elease Hashimoto MD, Bruce    . Weakness 12/09/2015    MEDICATIONS:  Prior to Admission medications   Medication Sig Start Date End Date Taking? Authorizing Provider  acetaminophen (TYLENOL) 325 MG tablet Take 2 tablets (650 mg total) by mouth every 6 (six) hours as needed for mild pain. 05/20/17   Nani Skillern, PA-C  aspirin 81 MG tablet Take 1 tablet (81 mg total) by mouth daily. 05/20/17   Nani Skillern, PA-C  citalopram (CELEXA) 20 MG tablet Take 1 tablet (20 mg total) by mouth daily. 08/26/17   Burchette, Alinda Sierras, MD  desonide (DESOWEN) 0.05 % cream Apply 1 application topically daily as needed (for facial flares).     [provider]  metoprolol tartrate (LOPRESSOR) 25 MG tablet Take 0.5 tablets (12.5 mg total) by mouth 2 (two) times daily. 06/09/17 09/07/17  Burtis Junes, NP  Misc Natural Products (GLUCOS-CHONDROIT-MSM COMPLEX) TABS Take 1 tablet by mouth 3 (three) times daily.    [provider]  simvastatin (ZOCOR) 20 MG tablet TAKE 1 TABLET BY MOUTH AT BEDTIME 07/04/17   Burchette, Alinda Sierras, MD  warfarin (COUMADIN) 2.5 MG tablet Take 1 tablet (2.5 mg  total) by mouth as directed. Or as directed. 06/08/17 06/08/18  Josue Hector, MD    ALLERGIES:  Allergies  Allergen Reactions  . 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 Smoking status: Former Smoker    Last attempt to quit: 08/09/1974    Years since quitting: 43.1  . Smokeless tobacco: Never Used  Substance Use Topics  . Alcohol use: No    FAMILY HISTORY: Family History  Problem Relation Age of Onset  . Alzheimer's disease Father 80  . Dementia Father   . Stroke Mother 10  .  Cancer Brother        Leukemia  . Sudden death Other        family  . Hypertension Unknown   . Heart disease Neg Hx     EXAM: BP (!) 173/66 (BP Location: Right Arm)   Pulse (!) 54   Temp 97.7 F (36.5 C) (Oral)   Resp 16   Ht 5\' 8"  (1.727 m)   Wt 77.1 kg (170 lb)   SpO2 99%   BMI 25.85 kg/m  CONSTITUTIONAL: Alert and oriented and responds appropriately to questions.  Elderly, in no significant distress HEAD: Normocephalic EYES: Conjunctivae clear, pupils appear equal, EOMI ENT: normal nose; moist mucous membranes NECK: Supple, no meningismus, no nuchal rigidity, no LAD  CARD: RRR; S1 and S2 appreciated; no murmurs, no clicks, no rubs, no gallops RESP: Normal chest excursion without splinting or tachypnea; breath sounds clear and equal bilaterally; no wheezes, no rhonchi, no rales, no hypoxia or respiratory distress, speaking full sentences ABD/GI: Normal bowel sounds; non-distended; soft, non-tender, no rebound, no guarding, no peritoneal signs, no hepatosplenomegaly BACK:  The back appears normal and is non-tender to palpation, there is no CVA tenderness EXT: Normal ROM in all joints; non-tender to palpation; no edema; normal capillary refill; no cyanosis, no calf tenderness or swelling; 2+ radial and DP pulses bilaterally SKIN: Normal color for age and race; warm; no rash NEURO: Moves all extremities equally PSYCH: The patient's mood and manner are appropriate. Grooming and personal hygiene are appropriate.  MEDICAL DECISION MAKING: Patient here with atypical chest pain.  Low suspicion for ACS especially given recent normal cardiac CT.  First troponin is negative.  Will obtain a CT Angie of his chest to evaluate his aorta given his history of type a dissection in September 2018 requiring repair.  Blood pressure is elevated.  States he is on a low-dose beta-blocker but no other blood pressure medication.  Will give nitroglycerin for pain and to see if this will improve his blood  pressure as well.  EKG shows no ischemic abnormality.  No sign of volume overload on  ED PROGRESS: Pain improved with nitroglycerin as well as his blood pressure.  It is 125/69.  CT scan pending.  Patient CT shows no acute abnormality.  Aortic repair and stent graft in place.  Dissection flaps noted.  No new change.  Patient's blood pressure is 141/67.  He has never had a history of hypertension previously and will follow-up closely with his primary care doctor.  He does state that this feels similar to indigestion and feels like he has had relief with GI cocktail in the past.  Will give GI cocktail and Protonix and discharged with PPI to take as needed.  He has had 2 negative troponins.  He has had a recent cardiac CT that  showed no significant coronary artery disease.  Pain seems very atypical.  Likely are related to GERD.  I feel he is safe to be discharged.  Patient and wife are comfortable with this plan.  He states he would prefer discharge.  He does have a PCP for close follow-up.  Also discussed with patient that his INR is subtherapeutic.  He takes Coumadin for atrial fibrillation.  Currently in a sinus rhythm.  At this time, I do not feel there is any life-threatening condition present. I have reviewed and discussed all results (EKG, imaging, lab, urine as appropriate) and exam findings with patient/family. I have reviewed nursing notes and appropriate previous records.  I feel the patient is safe to be discharged home without further emergent workup and can continue workup as an outpatient as needed. Discussed usual and customary return precautions. Patient/family verbalize understanding and are comfortable with this plan.  Outpatient follow-up has been provided if needed. All questions have been answered.    EKG Interpretation  Date/Time:  Saturday September 17 2017 03:37:49 EST Ventricular Rate:  60 PR Interval:  156 QRS Duration: 104 QT Interval:  460 QTC Calculation: 460 R  Axis:   82 Text Interpretation:  Normal sinus rhythm Normal ECG Anterior ST changes have resolved comapred to October 2018 Confirmed by Pryor Curia [phone]) on 09/17/2017 4:56:12 AM        EKG Interpretation  Date/Time:  Saturday September 17 2017 05:23:33 EST Ventricular Rate:  55 PR Interval:  156 QRS Duration: 114 QT Interval:  482 QTC Calculation: 461 R Axis:   87 Text Interpretation:  Age not entered, assumed to be  77 years old for purpose of ECG interpretation Sinus rhythm LAE, consider biatrial enlargement Borderline intraventricular conduction delay No significant change since last tracing Confirmed by Endy Easterly, Cyril Mourning [phone]) on 09/17/2017 5:31:46 AM          Argyle Gustafson, Delice Bison, DO 09/17/17 [id number]

## 2017-09-17 NOTE — ED Triage Notes (Addendum)
Reports waking up this morning with central chest pressure.  Took his bp at home.  Reports BP being elevated.  Hx of aortic dissection with repair in September 2018.

## 2017-09-19 ENCOUNTER — Encounter (HOSPITAL_COMMUNITY): Payer: Medicare HMO

## 2017-09-21 ENCOUNTER — Encounter (HOSPITAL_COMMUNITY): Payer: Medicare HMO

## 2017-09-23 ENCOUNTER — Encounter (HOSPITAL_COMMUNITY): Payer: Medicare HMO

## 2017-09-26 ENCOUNTER — Encounter (HOSPITAL_COMMUNITY): Payer: Medicare HMO

## 2017-09-26 ENCOUNTER — Encounter: Payer: Self-pay | Admitting: Family Medicine

## 2017-09-26 NOTE — Progress Notes (Signed)
CARDIOLOGY OFFICE NOTE  Date:  10/03/2017    Don Medina Date of Birth: 06-29-1941 Medical Record #[id number]  PCP:  Eulas Post, MD  Cardiologist:  Gillian Shields   No chief complaint on file.   History of Present Illness: Don Medina is a 77 y.o. male seen f/u  for acute type A aortic dissection.   He has a history of HLD and PVCs. No history of CAD, heart attack, HTN, HLD or DM. No prior tobacco use.  Had new onset chest and back pain    He was taken to the OR by Dr. Servando Snare 05/08/17 he underwent grafting of the ascending aorta above the sinus with resuspension of AV  Right innominate tied off. Balloon stent graft extending past left subclavian with graft to graft in ascending aorta bifucated to innominate and carotid and graft to graft left subclavian.   Post op had some PAF Rx with coumadin and amiodarone. 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   Post op echo reviewed 06/07/17 Ascending aorta 3.7 cm no AR EF 55-60%   Cardiac CTA 06/09/17 with no significant CAD stable repair   In ER 09/19/17 with chest pain R/O CXR and CT ok thinks it was gas better with belching   Some postural dizziness and has hemorrhoids    Past Medical History:  Diagnosis Date  . 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 Arthritis   . Atrial fibrillation ([place]) [I48.91] 05/23/2017  . Blood in the stool   . BRADYCARDIA 03/10/2010   Qualifier: Diagnosis of  By: Elease Hashimoto MD, Bruce    . CHEST PAIN 03/19/2010   Qualifier: Diagnosis of  By: Allean Found, LPN, Christine    . Chicken pox   . COLONIC POLYPS, HX OF 01/13/2009   Qualifier: Diagnosis of  By: Valma Cava LPN, Izora Gala    . Episode of generalized weakness 02/17/2016  . GERD (gastroesophageal reflux disease) 10/03/2012  . Hay fever   . History of colonic polyps   . History of colonoscopy   . Hyperlipidemia   . MUSCLE STRAIN, HAMSTRING MUSCLE 09/08/2009     Qualifier: Diagnosis of  By: Elease Hashimoto MD, Bruce    . ORTHOSTATIC DIZZINESS 03/05/2010   Qualifier: Diagnosis of  By: Elease Hashimoto MD, Bruce    . Other premature beats 03/17/2010   Qualifier: Diagnosis of  By: Lovena Le, MD, Martyn Malay   . Premature ventricular contraction 01/05/2011  . S/P aortic dissection repair 05/08/2017  . THROMBOCYTOPENIA 03/05/2010   Qualifier: Diagnosis of  By: Elease Hashimoto MD, Bruce    . Weakness 12/09/2015    Past Surgical History:  Procedure Laterality Date  . ABDOMINAL ADHESION SURGERY  2007  . AORTIC INTERVENTION N/A 05/08/2017   Procedure: HYPOTHERMIC CIRCULATORY ARREST;  Surgeon: [name], MD;  Location: [place];  Service: Open Heart Surgery;  Laterality: N/A;  . ASCENDING AORTIC ROOT REPLACEMENT N/A 05/08/2017   Procedure: REPLACEMENT OF ASCENDING AORTIC ARCH;  Surgeon: [name], MD;  Location: [place];  Service: Open Heart Surgery;  Laterality: N/A;  . CANNULATION FOR CARDIOPULMONARY BYPASS Right 05/08/2017   Procedure: AXILLARY CANNULATION;  Surgeon: [name], MD;  Location: [place];  Service: Open Heart Surgery;  Laterality: Right;  . ENDOVASCULAR REPAIR/STENT GRAFT  05/08/2017   Procedure: STENT OF LEFT SUBCLAVIAN ARTERY with 10x39VBX;  Surgeon: [name], MD;  Location: [place];  Service:  Open Heart Surgery;;  . herniated diks surgery L-5  1996  . herniated disk surgery c 6-7  1986  . INTRAOPERATIVE TRANSESOPHAGEAL ECHOCARDIOGRAM N/A 05/08/2017   Procedure: INTRAOPERATIVE TRANSESOPHAGEAL ECHOCARDIOGRAM;  Surgeon: [name], MD;  Location: [facility] OR;  Service: Open Heart Surgery;  Laterality: N/A;  . REPLACEMENT ASCENDING AORTA N/A 05/08/2017   Procedure: REPLACEMENT ASCENDING AORTA;  Surgeon: [name], MD;  Location: [place];  Service: Open Heart Surgery;  Laterality: N/A;  . THORACIC AORTIC ENDOVASCULAR STENT GRAFT  05/08/2017   Procedure: STENT GRAFTING OF DESCENDING THORACIC AORTA;  Surgeon: [name],  MD;  Location: [facility] OR;  Service: Open Heart Surgery;;     Medications: Current Meds  Medication Sig  . aspirin 81 MG tablet Take 1 tablet (81 mg total) by mouth daily.  . citalopram (CELEXA) 20 MG tablet Take 1 tablet (20 mg total) by mouth daily.  . metoprolol tartrate (LOPRESSOR) 25 MG tablet Take 0.5 tablets (12.5 mg total) by mouth 2 (two) times daily.  . Misc Natural Products (GLUCOS-CHONDROIT-MSM COMPLEX) TABS Take 1 tablet by mouth 3 (three) times daily.  . pantoprazole (PROTONIX) 40 MG tablet Take 1 tablet (40 mg total) by mouth daily. As needed for heartburn  . simvastatin (ZOCOR) 20 MG tablet TAKE 1 TABLET BY MOUTH AT BEDTIME  . warfarin (COUMADIN) 2.5 MG tablet Take 1 tablet (2.5 mg total) by mouth as directed. Or as directed. (Patient taking differently: Take 3.75-5 mg by mouth See admin instructions. Take 1 and 1/2 tablets on Tuesday, Thursday and Saturday then take 2 tablets the other days)     Allergies: Allergies  Allergen Reactions  . 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 he has never used smokeless tobacco. He reports that he does not drink alcohol or use drugs.   Family History: The patient's family history includes Alzheimer's disease (age of onset: 24) in his father; Cancer in his brother; Dementia in his father; Hypertension in his unknown relative; Stroke (age of onset: 29) in his mother; Sudden death in his other.   Review of Systems: Please see the history of present illness.   Otherwise, the review of systems is positive for none.   All other systems are reviewed and negative.   Physical Exam: VS:  BP 126/76   Pulse 61   Ht 5\' 8"  (1.727 m)   Wt 183 lb 3.2 oz (83.1 kg)   SpO2 97%   BMI 27.86 kg/m  .  BMI Body mass index is 27.86 kg/m.  Wt Readings from Last 3 Encounters:  10/03/17 183 lb 3.2 oz (83.1 kg)  09/17/17 170 lb (77.1 kg)   08/11/17 180 lb 1.9 oz (81.7 kg)    Affect appropriate Healthy:  appears stated age 18: normal Neck supple with no adenopathy JVP normal no bruits no thyromegaly Lungs clear with no wheezing and good diaphragmatic motion Heart:  S1/S2 no AR murmur, no rub, gallop or click PMI normal post sternotomy  Abdomen: benighn, BS positve, no tenderness, no AAA no bruit.  No HSM or HJR Distal pulses intact with no bruits No edema Neuro non-focal Skin warm and dry No muscular weakness    LABORATORY DATA:  EKG: 06/06/17  This shows NSR with inferolateral T wave changes. Inferior J point elevation  10/03/16  SR rate 56 LAE otherwise normal   Lab Results  Component Value Date   WBC 7.8 09/17/2017   HGB 14.1 09/17/2017   HCT 43.2 09/17/2017   PLT 171 09/17/2017   GLUCOSE 102 (H) 09/17/2017   CHOL 128 12/09/2015   TRIG 58 12/09/2015   HDL 60 12/09/2015   LDLCALC 56 12/09/2015   ALT 11 06/01/2017   AST 13 06/01/2017   NA 136 09/17/2017   K 4.2 09/17/2017   CL 100 (L) 09/17/2017   CREATININE 1.15 09/17/2017   BUN 15 09/17/2017   CO2 25 09/17/2017   TSH 5.980 (H) 06/01/2017   PSA 1.40 08/23/2013   INR 1.6 10/03/2017   HGBA1C 6.1 (H) 12/09/2015     BNP (last 3 results) No results for input(s): BNP in the last 8760 hours.  ProBNP (last 3 results) No results for input(s): PROBNP in the last 8760 hours.   Other Studies Reviewed Today:  TEE: 05/08/2017  Left ventricle: Normal cavity size and wall thickness. LV systolic function is normal with an EF of 55-60%. There are no obvious wall motion abnormalities.  Aortic valve: The valve is trileaflet. Severe regurgitation.  Aorta: The ascending aorta is dilated. 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  Right ventricle: Cavity is moderately to severely dilated. Moderately reduced systolic function.  Right atrium: Cavity is dilated.  Aorta: Graft present in the ascending  aorta.     Assessment/Plan:  1. Type A Aortic Dissection F/U Gerhardt repair intact on cardiac CT 06/29/17 BP under good control   2. Orthostatic hypotension/dizziness - improved with lower BP meds f/u carotid/vertebral duplex 6 months  3. Post op AF - now off amiodarone and coumadin maintaining NSR  Consider stopping coumadin if NSR year   4. +HIT noted PLT still done 09/17/17 171 f/u with primary      Jenkins Rouge

## 2017-09-28 ENCOUNTER — Encounter (HOSPITAL_COMMUNITY): Payer: Medicare HMO

## 2017-09-29 DIAGNOSIS — R69 Illness, unspecified: Secondary | ICD-10-CM | POA: Diagnosis not present

## 2017-09-30 ENCOUNTER — Encounter (HOSPITAL_COMMUNITY): Payer: Medicare HMO

## 2017-10-03 ENCOUNTER — Ambulatory Visit (INDEPENDENT_AMBULATORY_CARE_PROVIDER_SITE_OTHER): Payer: Medicare HMO | Admitting: Pharmacist

## 2017-10-03 ENCOUNTER — Encounter: Payer: Self-pay | Admitting: Cardiovascular Disease

## 2017-10-03 ENCOUNTER — Ambulatory Visit: Payer: Medicare HMO | Admitting: Cardiovascular Disease

## 2017-10-03 ENCOUNTER — Other Ambulatory Visit: Payer: Self-pay | Admitting: Cardiovascular Disease

## 2017-10-03 ENCOUNTER — Encounter (HOSPITAL_COMMUNITY): Payer: Medicare HMO

## 2017-10-03 ENCOUNTER — Other Ambulatory Visit: Payer: Self-pay | Admitting: Pharmacist

## 2017-10-03 VITALS — BP 126/76 | HR 61 | Ht 68.0 in | Wt 183.2 lb

## 2017-10-03 DIAGNOSIS — I4891 Unspecified atrial fibrillation: Secondary | ICD-10-CM | POA: Diagnosis not present

## 2017-10-03 DIAGNOSIS — Z9889 Other specified postprocedural states: Secondary | ICD-10-CM

## 2017-10-03 DIAGNOSIS — Z5181 Encounter for therapeutic drug level monitoring: Secondary | ICD-10-CM

## 2017-10-03 DIAGNOSIS — I951 Orthostatic hypotension: Secondary | ICD-10-CM | POA: Diagnosis not present

## 2017-10-03 LAB — POCT INR: INR: 1.6

## 2017-10-03 MED ORDER — WARFARIN SODIUM 2.5 MG PO TABS: ORAL_TABLET | ORAL | 3 refills | Status: DC

## 2017-10-03 NOTE — Patient Instructions (Addendum)
Take an extra tablet today, Feb 25 then change dose of coumadin to 2 tablets daily except 1 and 1/2 tablets on Saturdays.  Recheck in 2 weeks.  Main #  [phone], Coumadin Clinic # [phone] Amiodarone d/c on 08/10/17  Call with any change in medications or scheduled for any procedures that you would need to hold coumadin Appt made with time of appt with Dr Johnsie Cancel

## 2017-10-03 NOTE — Patient Instructions (Addendum)
Medication Instructions:  Your physician recommends that you continue on your current medications as directed. Please refer to the Current Medication list given to you today.  Labwork: NONE  Testing/Procedures: Your physician has requested that you have a carotid duplex in 6 months. This test is an ultrasound of the carotid arteries in your neck. It looks at blood flow through these arteries that supply the brain with blood. Allow one hour for this exam. There are no restrictions or special instructions.  Follow-Up: Your physician wants you to follow-up in: 6 months with Dr. Nishan. You will receive a reminder letter in the mail two months in advance. If you don't receive a letter, please call our office to schedule the follow-up appointment.   If you need a refill on your cardiac medications before your next appointment, please call your pharmacy.    

## 2017-10-05 ENCOUNTER — Encounter (HOSPITAL_COMMUNITY): Payer: Medicare HMO

## 2017-10-07 ENCOUNTER — Encounter (HOSPITAL_COMMUNITY): Payer: Medicare HMO

## 2017-10-10 ENCOUNTER — Encounter (HOSPITAL_COMMUNITY): Payer: Medicare HMO

## 2017-10-12 ENCOUNTER — Encounter (HOSPITAL_COMMUNITY): Payer: Medicare HMO

## 2017-10-14 ENCOUNTER — Ambulatory Visit (INDEPENDENT_AMBULATORY_CARE_PROVIDER_SITE_OTHER): Payer: Medicare HMO | Admitting: *Deleted

## 2017-10-14 ENCOUNTER — Encounter (HOSPITAL_COMMUNITY): Payer: Medicare HMO

## 2017-10-14 DIAGNOSIS — Z9889 Other specified postprocedural states: Secondary | ICD-10-CM

## 2017-10-14 DIAGNOSIS — Z5181 Encounter for therapeutic drug level monitoring: Secondary | ICD-10-CM

## 2017-10-14 DIAGNOSIS — I4891 Unspecified atrial fibrillation: Secondary | ICD-10-CM | POA: Diagnosis not present

## 2017-10-14 LAB — POCT INR: INR: 1.5

## 2017-10-14 NOTE — Patient Instructions (Addendum)
  Description   Today take 2.5 tablets and tomorrow take 2 tablets then change dose to 2 tablets daily.  Recheck in 10 days.  Main #  [phone], Coumadin Clinic # [phone] Amiodarone d/c on 08/10/17  Call with any change in medications or scheduled for any procedures that you would need to hold coumadin Appt made with time of appt with Dr Johnsie Cancel

## 2017-10-17 ENCOUNTER — Encounter (HOSPITAL_COMMUNITY): Payer: Medicare HMO

## 2017-10-19 ENCOUNTER — Encounter (HOSPITAL_COMMUNITY): Payer: Medicare HMO

## 2017-10-21 ENCOUNTER — Encounter (HOSPITAL_COMMUNITY): Payer: Medicare HMO

## 2017-10-24 ENCOUNTER — Ambulatory Visit (INDEPENDENT_AMBULATORY_CARE_PROVIDER_SITE_OTHER): Payer: Medicare HMO | Admitting: Physician Assistant

## 2017-10-24 ENCOUNTER — Other Ambulatory Visit: Payer: Self-pay

## 2017-10-24 ENCOUNTER — Encounter: Payer: Self-pay | Admitting: Physician Assistant

## 2017-10-24 ENCOUNTER — Encounter (HOSPITAL_COMMUNITY): Payer: Medicare HMO

## 2017-10-24 VITALS — BP 126/70 | HR 69 | Temp 98.0°F | Resp 14 | Ht 68.0 in | Wt 180.2 lb

## 2017-10-24 DIAGNOSIS — J029 Acute pharyngitis, unspecified: Secondary | ICD-10-CM

## 2017-10-24 LAB — POCT RAPID STREP A (OFFICE): Rapid Strep A Screen: NEGATIVE

## 2017-10-24 NOTE — Patient Instructions (Signed)
Please stay well-hydrated and get plenty of rest.  Put a humidifier in the bedroom.  Tylenol for sore throat. Salt-water gargles are also beneficial.  If you note any worsening cough, you can take plain Delsym for this.  Symptoms can last 7-10 days. Usually days 2-4 are the worst and things start to improve.   Call me or come see me if you note any new or worsening symptoms.    Pharyngitis Pharyngitis is a sore throat (pharynx). There is redness, pain, and swelling of your throat. Follow these instructions at home:  Drink enough fluids to keep your pee (urine) clear or pale yellow.  Only take medicine as told by your doctor. ? You may get sick again if you do not take medicine as told. Finish your medicines, even if you start to feel better. ? Do not take aspirin.  Rest.  Rinse your mouth (gargle) with salt water ( tsp of salt per 1 qt of water) every 1-2 hours. This will help the pain.  If you are not at risk for choking, you can suck on hard candy or sore throat lozenges. Contact a doctor if:  You have large, tender lumps on your neck.  You have a rash.  You cough up green, yellow-brown, or bloody spit. Get help right away if:  You have a stiff neck.  You drool or cannot swallow liquids.  You throw up (vomit) or are not able to keep medicine or liquids down.  You have very bad pain that does not go away with medicine.  You have problems breathing (not from a stuffy nose). This information is not intended to replace advice given to you by your health care provider. Make sure you discuss any questions you have with your health care provider. Document Released: 01/12/2008 Document Revised: 01/01/2016 Document Reviewed: 04/02/2013 Elsevier Interactive Patient Education  2017 [employer].

## 2017-10-24 NOTE — Progress Notes (Signed)
Patient presents to clinic today c/o 3 days of sore throat associated with a dry cough and PND. Denies fever, chills, chest congestion. Notes odynophagia and hoarseness. Denies dysphagia. Denies sinus pressure, sinus pain, ear pain. Has taken Tylenol to help with pain. Patient is concerned for strep throat.   Past Medical History:  Diagnosis Date  . 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 Arthritis   . Atrial fibrillation ([place]) [I48.91] 05/23/2017  . Blood in the stool   . BRADYCARDIA 03/10/2010   Qualifier: Diagnosis of  By: Elease Hashimoto MD, Bruce    . CHEST PAIN 03/19/2010   Qualifier: Diagnosis of  By: Allean Found, LPN, Christine    . Chicken pox   . COLONIC POLYPS, HX OF 01/13/2009   Qualifier: Diagnosis of  By: Valma Cava LPN, Izora Gala    . Episode of generalized weakness 02/17/2016  . GERD (gastroesophageal reflux disease) 10/03/2012  . Hay fever   . History of colonic polyps   . History of colonoscopy   . Hyperlipidemia   . MUSCLE STRAIN, HAMSTRING MUSCLE 09/08/2009   Qualifier: Diagnosis of  By: Elease Hashimoto MD, Bruce    . ORTHOSTATIC DIZZINESS 03/05/2010   Qualifier: Diagnosis of  By: Elease Hashimoto MD, Bruce    . Other premature beats 03/17/2010   Qualifier: Diagnosis of  By: Lovena Le, MD, Martyn Malay   . Premature ventricular contraction 01/05/2011  . S/P aortic dissection repair 05/08/2017  . THROMBOCYTOPENIA 03/05/2010   Qualifier: Diagnosis of  By: Elease Hashimoto MD, Bruce    . Weakness 12/09/2015    Current Outpatient Medications on File Prior to Visit  Medication Sig Dispense Refill  . aspirin 81 MG tablet Take 1 tablet (81 mg total) by mouth daily. 30 tablet   . citalopram (CELEXA) 20 MG tablet Take 1 tablet (20 mg total) by mouth daily. 30 tablet 5  . metoprolol tartrate (LOPRESSOR) 25 MG tablet Take 0.5 tablets (12.5 mg total) by mouth 2 (two) times daily. 90 tablet 3  . Misc Natural Products (GLUCOS-CHONDROIT-MSM  COMPLEX) TABS Take 1 tablet by mouth 3 (three) times daily.    . pantoprazole (PROTONIX) 40 MG tablet Take 1 tablet (40 mg total) by mouth daily. As needed for heartburn 30 tablet 0  . simvastatin (ZOCOR) 20 MG tablet TAKE 1 TABLET BY MOUTH AT BEDTIME 90 tablet 1  . warfarin (COUMADIN) 2.5 MG tablet Take 1.5 to 2 tablets daily as directed by Coumadin clinic 60 tablet 3   No current facility-administered medications on file prior to visit.     Allergies  Allergen Reactions  . 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 Alzheimer's disease Father 57  . Dementia Father   . Stroke Mother 9  . Cancer Brother        Leukemia  . Sudden death Other        family  . Hypertension Unknown   . Heart disease Neg Hx     Social History   Socioeconomic History  . Marital status: Married    Spouse name: None  . Number of children: None  . Years of education: None  . Highest education level: None  Social Needs  . Financial resource strain: None  . Food insecurity - worry: None  . Food insecurity - inability: None  . Transportation needs -  medical: None  . Transportation needs - non-medical: None  Occupational History  . Occupation: Retired  Tobacco Use  . Smoking status: Former Smoker    Last attempt to quit: 08/09/1974    Years since quitting: 43.2  . Smokeless tobacco: Never Used  Substance and Sexual Activity  . Alcohol use: No  . Drug use: No  . Sexual activity: Not Currently  Other Topics Concern  . None  Social History Narrative  . None   Review of Systems - See HPI.  All other ROS are negative.  BP 126/70   Pulse 69   Temp 98 F (36.7 C) (Oral)   Resp 14   Ht '5\' 8"'  (1.727 m)   Wt 180 lb 3.2 oz (81.7 kg)   SpO2 96%   BMI 27.40 kg/m   Physical Exam  Constitutional: He is oriented to person, place, and time and well-developed, well-nourished, and in no distress.  HENT:  Head:  Normocephalic and atraumatic.  Right Ear: Tympanic membrane and external ear normal.  Left Ear: Tympanic membrane and external ear normal.  Nose: Nose normal.  Mouth/Throat: Posterior oropharyngeal erythema (mild) present. No oropharyngeal exudate, posterior oropharyngeal edema or tonsillar abscesses.  Eyes: Conjunctivae are normal.  Neck: Neck supple.  Cardiovascular: Normal rate, regular rhythm, normal heart sounds and intact distal pulses.  Pulmonary/Chest: Effort normal and breath sounds normal. No respiratory distress. He has no wheezes. He has no rales. He exhibits no tenderness.  Neurological: He is alert and oriented to person, place, and time.  Skin: Skin is warm and dry. No rash noted.  Psychiatric: Affect normal.  Vitals reviewed.   Recent Results (from the past 2160 hour(s))  POCT INR     Status: None   Collection Time: 07/26/17  9:40 AM  Result Value Ref Range   INR 2.1   POCT INR     Status: None   Collection Time: 08/16/17  9:14 AM  Result Value Ref Range   INR 1.8   POCT INR     Status: None   Collection Time: 08/30/17  9:15 AM  Result Value Ref Range   INR 1.7   POCT INR     Status: None   Collection Time: 09/13/17  8:48 AM  Result Value Ref Range   INR 1.8   Basic metabolic panel     Status: Abnormal   Collection Time: 09/17/17  4:19 AM  Result Value Ref Range   Sodium 136 135 - 145 mmol/L   Potassium 4.2 3.5 - 5.1 mmol/L   Chloride 100 (L) 101 - 111 mmol/L   CO2 25 22 - 32 mmol/L   Glucose, Bld 102 (H) 65 - 99 mg/dL   BUN 15 6 - 20 mg/dL   Creatinine, Ser 1.15 0.61 - 1.24 mg/dL   Calcium 8.9 8.9 - 10.3 mg/dL   GFR calc non Af Amer >60 >60 mL/min   GFR calc Af Amer >60 >60 mL/min    Comment: (NOTE) The eGFR has been calculated using the CKD EPI equation. This calculation has not been validated in all clinical situations. eGFR's persistently <60 mL/min signify possible Chronic Kidney Disease.    Anion gap 11 5 - 15    Comment: Performed at [facility] [address] [place], [place] [address]  CBC     Status: None   Collection Time: 09/17/17  4:19 AM  Result Value Ref Range   WBC 7.8 4.0 - 10.5 K/uL   RBC  4.93 4.22 - 5.81 MIL/uL   Hemoglobin 14.1 13.0 - 17.0 g/dL   HCT 43.2 39.0 - 52.0 %   MCV 87.6 78.0 - 100.0 fL   MCH 28.6 26.0 - 34.0 pg   MCHC 32.6 30.0 - 36.0 g/dL   RDW 14.4 11.5 - 15.5 %   Platelets 171 150 - 400 K/uL    Comment: Performed at [facility], [place] [address] [place], [place] [address]  I-stat troponin, ED     Status: None   Collection Time: 09/17/17  4:32 AM  Result Value Ref Range   Troponin i, poc 0.00 0.00 - 0.08 ng/mL   Comment 3            Comment: Due to the release kinetics of cTnI, a negative result within the first hours of the onset of symptoms does not rule out myocardial infarction with certainty. If myocardial infarction is still suspected, repeat the test at appropriate intervals.   Protime-INR     Status: Abnormal   Collection Time: 09/17/17  6:02 AM  Result Value Ref Range   Prothrombin Time 18.9 (H) 11.4 - 15.2 seconds   INR 1.60     Comment: Performed at [facility] [address], [place], [place] [address]  I-stat troponin, ED     Status: None   Collection Time: 09/17/17  7:40 AM  Result Value Ref Range   Troponin i, poc 0.01 0.00 - 0.08 ng/mL   Comment 3            Comment: Due to the release kinetics of cTnI, a negative result within the first hours of the onset of symptoms does not rule out myocardial infarction with certainty. If myocardial infarction is still suspected, repeat the test at appropriate intervals.   POCT INR     Status: None   Collection Time: 10/03/17  9:44 AM  Result Value Ref Range   INR 1.6   POCT INR     Status: None   Collection Time: 10/14/17  9:12 AM  Result Value Ref Range   INR 1.5   POCT rapid strep A     Status: Normal   Collection Time: 10/24/17  9:14 AM  Result Value Ref Range   Rapid Strep A Screen Negative  Negative   Assessment/Plan: 1. Viral pharyngitis Strep test negative. Exam unremarkable. Viral etiology most likely. Start supportive measures. Measures and OTC medications reviewed. Follow-up if not improving in the next 3-4 days.  - POCT rapid strep A   Leeanne Rio, PA-C

## 2017-10-25 ENCOUNTER — Ambulatory Visit (INDEPENDENT_AMBULATORY_CARE_PROVIDER_SITE_OTHER): Payer: Medicare HMO | Admitting: *Deleted

## 2017-10-25 ENCOUNTER — Ambulatory Visit: Payer: Medicare HMO | Admitting: Family Medicine

## 2017-10-25 DIAGNOSIS — Z9889 Other specified postprocedural states: Secondary | ICD-10-CM | POA: Diagnosis not present

## 2017-10-25 DIAGNOSIS — Z5181 Encounter for therapeutic drug level monitoring: Secondary | ICD-10-CM

## 2017-10-25 DIAGNOSIS — I4891 Unspecified atrial fibrillation: Secondary | ICD-10-CM

## 2017-10-25 LAB — POCT INR: INR: 1.7

## 2017-10-25 NOTE — Patient Instructions (Signed)
Description   Today take 7.5mg , then change your dose to 5mg  daily except 7.5mg  on Tuesdays and Saturdays. Recheck in 2 weeks. Main #  [phone], Coumadin Clinic # [phone]

## 2017-10-26 ENCOUNTER — Encounter (HOSPITAL_COMMUNITY): Payer: Medicare HMO

## 2017-10-28 ENCOUNTER — Encounter (HOSPITAL_COMMUNITY): Payer: Medicare HMO

## 2017-10-28 ENCOUNTER — Ambulatory Visit (INDEPENDENT_AMBULATORY_CARE_PROVIDER_SITE_OTHER): Payer: Medicare HMO | Admitting: Physician Assistant

## 2017-10-28 ENCOUNTER — Encounter: Payer: Self-pay | Admitting: Physician Assistant

## 2017-10-28 ENCOUNTER — Other Ambulatory Visit: Payer: Self-pay

## 2017-10-28 VITALS — BP 130/70 | HR 76 | Temp 98.7°F | Resp 15 | Ht 68.0 in | Wt 180.2 lb

## 2017-10-28 DIAGNOSIS — B9689 Other specified bacterial agents as the cause of diseases classified elsewhere: Secondary | ICD-10-CM | POA: Diagnosis not present

## 2017-10-28 DIAGNOSIS — J208 Acute bronchitis due to other specified organisms: Secondary | ICD-10-CM

## 2017-10-28 MED ORDER — AMOXICILLIN-POT CLAVULANATE 875-125 MG PO TABS: 1.0000 | ORAL_TABLET | Freq: Two times a day (BID) | ORAL | 0 refills | Status: DC

## 2017-10-28 MED ORDER — BENZONATATE 100 MG PO CAPS: 100.0000 mg | ORAL_CAPSULE | Freq: Two times a day (BID) | ORAL | 0 refills | Status: DC | PRN

## 2017-10-28 NOTE — Patient Instructions (Signed)
Take antibiotic (Augmentin) as directed.  Increase fluids.  Get plenty of rest. Use plain Mucinex for congestion. Tessalon for cough. Take a daily probiotic (I recommend Align or Culturelle, but even Activia Yogurt may be beneficial).  A humidifier placed in the bedroom may offer some relief for a dry, scratchy throat of nasal irritation.  Read information below on acute bronchitis. Please call or return to clinic if symptoms are not improving.  Acute Bronchitis Bronchitis is when the airways that extend from the windpipe into the lungs get red, puffy, and painful (inflamed). Bronchitis often causes thick spit (mucus) to develop. This leads to a cough. A cough is the most common symptom of bronchitis. In acute bronchitis, the condition usually begins suddenly and goes away over time (usually in 2 weeks). Smoking, allergies, and asthma can make bronchitis worse. Repeated episodes of bronchitis may cause more lung problems.  HOME CARE  Rest.  Drink enough fluids to keep your pee (urine) clear or pale yellow (unless you need to limit fluids as told by your doctor).  Only take over-the-counter or prescription medicines as told by your doctor.  Avoid smoking and secondhand smoke. These can make bronchitis worse. If you are a smoker, think about using nicotine gum or skin patches. Quitting smoking will help your lungs heal faster.  Reduce the chance of getting bronchitis again by:  Washing your hands often.  Avoiding people with cold symptoms.  Trying not to touch your hands to your mouth, nose, or eyes.  Follow up with your doctor as told.  GET HELP IF: Your symptoms do not improve after 1 week of treatment. Symptoms include:  Cough.  Fever.  Coughing up thick spit.  Body aches.  Chest congestion.  Chills.  Shortness of breath.  Sore throat.  GET HELP RIGHT AWAY IF:   You have an increased fever.  You have chills.  You have severe shortness of breath.  You have bloody  thick spit (sputum).  You throw up (vomit) often.  You lose too much body fluid (dehydration).  You have a severe headache.  You faint.  MAKE SURE YOU:   Understand these instructions.  Will watch your condition.  Will get help right away if you are not doing well or get worse. Document Released: 01/12/2008 Document Revised: 03/28/2013 Document Reviewed: 01/16/2013 [facility] Patient Information 2015 [place], [place]. This information is not intended to replace advice given to you by your health care provider. Make sure you discuss any questions you have with your health care provider.

## 2017-10-28 NOTE — Progress Notes (Signed)
Patient presents to clinic today c/o worsening cough with continued nasal drainage, odynophagia and sore throat. Denies fever, chills. Denies nausea/vomiting or diarrhea. Denies chest pain or SOB.   Past Medical History:  Diagnosis Date  . 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 Arthritis   . Atrial fibrillation ([place]) [I48.91] 05/23/2017  . Blood in the stool   . BRADYCARDIA 03/10/2010   Qualifier: Diagnosis of  By: Elease Hashimoto MD, Bruce    . CHEST PAIN 03/19/2010   Qualifier: Diagnosis of  By: Allean Found, LPN, Christine    . Chicken pox   . COLONIC POLYPS, HX OF 01/13/2009   Qualifier: Diagnosis of  By: Valma Cava LPN, Izora Gala    . Episode of generalized weakness 02/17/2016  . GERD (gastroesophageal reflux disease) 10/03/2012  . Hay fever   . History of colonic polyps   . History of colonoscopy   . Hyperlipidemia   . MUSCLE STRAIN, HAMSTRING MUSCLE 09/08/2009   Qualifier: Diagnosis of  By: Elease Hashimoto MD, Bruce    . ORTHOSTATIC DIZZINESS 03/05/2010   Qualifier: Diagnosis of  By: Elease Hashimoto MD, Bruce    . Other premature beats 03/17/2010   Qualifier: Diagnosis of  By: Lovena Le, MD, Martyn Malay   . Premature ventricular contraction 01/05/2011  . S/P aortic dissection repair 05/08/2017  . THROMBOCYTOPENIA 03/05/2010   Qualifier: Diagnosis of  By: Elease Hashimoto MD, Bruce    . Weakness 12/09/2015    Current Outpatient Medications on File Prior to Visit  Medication Sig Dispense Refill  . aspirin 81 MG tablet Take 1 tablet (81 mg total) by mouth daily. 30 tablet   . citalopram (CELEXA) 20 MG tablet Take 1 tablet (20 mg total) by mouth daily. 30 tablet 5  . metoprolol tartrate (LOPRESSOR) 25 MG tablet Take 0.5 tablets (12.5 mg total) by mouth 2 (two) times daily. 90 tablet 3  . Misc Natural Products (GLUCOS-CHONDROIT-MSM COMPLEX) TABS Take 1 tablet by mouth 3 (three) times daily.    . pantoprazole (PROTONIX) 40 MG tablet Take 1 tablet  (40 mg total) by mouth daily. As needed for heartburn 30 tablet 0  . simvastatin (ZOCOR) 20 MG tablet TAKE 1 TABLET BY MOUTH AT BEDTIME 90 tablet 1  . warfarin (COUMADIN) 2.5 MG tablet Take 1.5 to 2 tablets daily as directed by Coumadin clinic 60 tablet 3   No current facility-administered medications on file prior to visit.     Allergies  Allergen Reactions  . 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 Alzheimer's disease Father 3  . Dementia Father   . Stroke Mother 66  . Cancer Brother        Leukemia  . Sudden death Other        family  . Hypertension Unknown   . Heart disease Neg Hx     Social History   Socioeconomic History  . Marital status: Married    Spouse name: Not on file  . Number of children: Not on file  . Years of education: Not on file  . Highest education level: Not on file  Occupational History  . Occupation: Retired  Scientific laboratory technician  . Financial resource strain: Not on file  . Food insecurity:    Worry: Not on file    Inability: Not on file  . Transportation needs:  Medical: Not on file    Non-medical: Not on file  Tobacco Use  . Smoking status: Former Smoker    Last attempt to quit: 08/09/1974    Years since quitting: 43.2  . Smokeless tobacco: Never Used  Substance and Sexual Activity  . Alcohol use: No  . Drug use: No  . Sexual activity: Not Currently  Lifestyle  . Physical activity:    Days per week: 4 days    Minutes per session: 30 min  . Stress: Not on file  Relationships  . Social connections:    Talks on phone: Not on file    Gets together: Not on file    Attends religious service: Not on file    Active member of club or organization: Not on file    Attends meetings of clubs or organizations: Not on file    Relationship status: Not on file  Other Topics Concern  . Not on file  Social History Narrative  . Not on file   Review of Systems - See  HPI.  All other ROS are negative.  BP 130/70   Pulse 76   Temp 98.7 F (37.1 C) (Oral)   Resp 15   Ht '5\' 8"'  (1.727 m)   Wt 180 lb 3.2 oz (81.7 kg)   SpO2 96%   BMI 27.40 kg/m   Physical Exam  Constitutional: He is oriented to person, place, and time and well-developed, well-nourished, and in no distress.  HENT:  Head: Normocephalic and atraumatic.  Cardiovascular: Normal rate, regular rhythm, normal heart sounds and intact distal pulses.  Pulmonary/Chest: Effort normal and breath sounds normal. No respiratory distress. He has no wheezes. He has no rales. He exhibits no tenderness.  Neurological: He is alert and oriented to person, place, and time.  Skin: Skin is warm and dry. No rash noted.  Psychiatric: Affect normal.  Vitals reviewed.  Recent Results (from the past 2160 hour(s))  POCT INR     Status: None   Collection Time: 08/16/17  9:14 AM  Result Value Ref Range   INR 1.8   POCT INR     Status: None   Collection Time: 08/30/17  9:15 AM  Result Value Ref Range   INR 1.7   POCT INR     Status: None   Collection Time: 09/13/17  8:48 AM  Result Value Ref Range   INR 1.8   Basic metabolic panel     Status: Abnormal   Collection Time: 09/17/17  4:19 AM  Result Value Ref Range   Sodium 136 135 - 145 mmol/L   Potassium 4.2 3.5 - 5.1 mmol/L   Chloride 100 (L) 101 - 111 mmol/L   CO2 25 22 - 32 mmol/L   Glucose, Bld 102 (H) 65 - 99 mg/dL   BUN 15 6 - 20 mg/dL   Creatinine, Ser 1.15 0.61 - 1.24 mg/dL   Calcium 8.9 8.9 - 10.3 mg/dL   GFR calc non Af Amer >60 >60 mL/min   GFR calc Af Amer >60 >60 mL/min    Comment: (NOTE) The eGFR has been calculated using the CKD EPI equation. This calculation has not been validated in all clinical situations. eGFR's persistently <60 mL/min signify possible Chronic Kidney Disease.    Anion gap 11 5 - 15    Comment: Performed at [facility] [address] [place], [place] [address]  CBC     Status: None   Collection Time:  09/17/17  4:19 AM  Result Value Ref Range   WBC 7.8 4.0 - 10.5 K/uL   RBC 4.93 4.22 - 5.81 MIL/uL   Hemoglobin 14.1 13.0 - 17.0 g/dL   HCT 43.2 39.0 - 52.0 %   MCV 87.6 78.0 - 100.0 fL   MCH 28.6 26.0 - 34.0 pg   MCHC 32.6 30.0 - 36.0 g/dL   RDW 14.4 11.5 - 15.5 %   Platelets 171 150 - 400 K/uL    Comment: Performed at [facility], [place] [address], [place], [place] [address]  I-stat troponin, ED     Status: None   Collection Time: 09/17/17  4:32 AM  Result Value Ref Range   Troponin i, poc 0.00 0.00 - 0.08 ng/mL   Comment 3            Comment: Due to the release kinetics of cTnI, a negative result within the first hours of the onset of symptoms does not rule out myocardial infarction with certainty. If myocardial infarction is still suspected, repeat the test at appropriate intervals.   Protime-INR     Status: Abnormal   Collection Time: 09/17/17  6:02 AM  Result Value Ref Range   Prothrombin Time 18.9 (H) 11.4 - 15.2 seconds   INR 1.60     Comment: Performed at [facility] [address] [place], [place] [address]  I-stat troponin, ED     Status: None   Collection Time: 09/17/17  7:40 AM  Result Value Ref Range   Troponin i, poc 0.01 0.00 - 0.08 ng/mL   Comment 3            Comment: Due to the release kinetics of cTnI, a negative result within the first hours of the onset of symptoms does not rule out myocardial infarction with certainty. If myocardial infarction is still suspected, repeat the test at appropriate intervals.   POCT INR     Status: None   Collection Time: 10/03/17  9:44 AM  Result Value Ref Range   INR 1.6   POCT INR     Status: None   Collection Time: 10/14/17  9:12 AM  Result Value Ref Range   INR 1.5   POCT rapid strep A     Status: Normal   Collection Time: 10/24/17  9:14 AM  Result Value Ref Range   Rapid Strep A Screen Negative Negative  POCT INR     Status: None   Collection Time: 10/25/17  2:09 PM  Result Value Ref Range     INR 1.7    Assessment/Plan: 1. Acute bacterial bronchitis Rx Augmentin.  Increase fluids.  Rest.  Saline nasal spray.  Probiotic.  Mucinex as directed.  Humidifier in bedroom. Tessalon per orders.  Call or return to clinic if symptoms are not improving.  - amoxicillin-clavulanate (AUGMENTIN) 875-125 MG tablet; Take 1 tablet by mouth 2 (two) times daily.  Dispense: 14 tablet; Refill: 0 - benzonatate (TESSALON) 100 MG capsule; Take 1 capsule (100 mg total) by mouth 2 (two) times daily as needed for cough.  Dispense: 20 capsule; Refill: 0  Leeanne Rio, PA-C

## 2017-10-29 ENCOUNTER — Telehealth: Payer: Self-pay | Admitting: Cardiology

## 2017-10-29 NOTE — Telephone Encounter (Signed)
Pt called to check and see if Augmentin was OK to take while on Coumadin and I told him it was.  Kerin Ransom PA-C 10/29/2017 9:20 AM

## 2017-10-31 ENCOUNTER — Encounter (HOSPITAL_COMMUNITY): Payer: Medicare HMO

## 2017-11-02 ENCOUNTER — Encounter (HOSPITAL_COMMUNITY): Payer: Medicare HMO

## 2017-11-04 ENCOUNTER — Encounter (HOSPITAL_COMMUNITY): Payer: Medicare HMO

## 2017-11-09 ENCOUNTER — Ambulatory Visit: Payer: Medicare HMO | Admitting: *Deleted

## 2017-11-09 DIAGNOSIS — Z9889 Other specified postprocedural states: Secondary | ICD-10-CM

## 2017-11-09 DIAGNOSIS — Z5181 Encounter for therapeutic drug level monitoring: Secondary | ICD-10-CM

## 2017-11-09 DIAGNOSIS — I4891 Unspecified atrial fibrillation: Secondary | ICD-10-CM

## 2017-11-09 LAB — POCT INR: INR: 2.6

## 2017-11-09 NOTE — Patient Instructions (Signed)
Description   Today take 7.5mg  (since you missed last night's dose), then continue taking 5mg  daily except 7.5mg  on Tuesdays and Saturdays. Recheck in 4 weeks. Main #  [phone], Coumadin Clinic # [phone]

## 2017-11-14 ENCOUNTER — Encounter: Payer: Self-pay | Admitting: Cardiovascular Disease

## 2017-12-02 DIAGNOSIS — D692 Other nonthrombocytopenic purpura: Secondary | ICD-10-CM | POA: Diagnosis not present

## 2017-12-02 DIAGNOSIS — D2261 Melanocytic nevi of right upper limb, including shoulder: Secondary | ICD-10-CM | POA: Diagnosis not present

## 2017-12-02 DIAGNOSIS — D2262 Melanocytic nevi of left upper limb, including shoulder: Secondary | ICD-10-CM | POA: Diagnosis not present

## 2017-12-02 DIAGNOSIS — D225 Melanocytic nevi of trunk: Secondary | ICD-10-CM | POA: Diagnosis not present

## 2017-12-02 DIAGNOSIS — L814 Other melanin hyperpigmentation: Secondary | ICD-10-CM | POA: Diagnosis not present

## 2017-12-02 DIAGNOSIS — L570 Actinic keratosis: Secondary | ICD-10-CM | POA: Diagnosis not present

## 2017-12-02 DIAGNOSIS — D1801 Hemangioma of skin and subcutaneous tissue: Secondary | ICD-10-CM | POA: Diagnosis not present

## 2017-12-02 DIAGNOSIS — L821 Other seborrheic keratosis: Secondary | ICD-10-CM | POA: Diagnosis not present

## 2017-12-06 ENCOUNTER — Encounter: Payer: Self-pay | Admitting: Cardiovascular Disease

## 2017-12-07 ENCOUNTER — Other Ambulatory Visit: Payer: Self-pay | Admitting: Family Medicine

## 2017-12-07 ENCOUNTER — Ambulatory Visit (INDEPENDENT_AMBULATORY_CARE_PROVIDER_SITE_OTHER): Payer: Medicare HMO | Admitting: *Deleted

## 2017-12-07 DIAGNOSIS — I4891 Unspecified atrial fibrillation: Secondary | ICD-10-CM | POA: Diagnosis not present

## 2017-12-07 DIAGNOSIS — Z5181 Encounter for therapeutic drug level monitoring: Secondary | ICD-10-CM | POA: Diagnosis not present

## 2017-12-07 DIAGNOSIS — Z9889 Other specified postprocedural states: Secondary | ICD-10-CM | POA: Diagnosis not present

## 2017-12-07 LAB — POCT INR: INR: 2.5

## 2017-12-07 MED ORDER — CITALOPRAM HYDROBROMIDE 20 MG PO TABS: 20.0000 mg | ORAL_TABLET | Freq: Every day | ORAL | 0 refills | Status: DC

## 2017-12-07 NOTE — Telephone Encounter (Addendum)
Celexa-Requesting 90 day supply Last OV: Non recent Last filled:08/26/17 30 tab/5 refills PCP: [place]: [facility] [address], [place] - [address] [phone] (Phone) [phone] (Fax)

## 2017-12-07 NOTE — Patient Instructions (Signed)
Description   Continue taking 5mg daily except 7.5mg on Tuesdays and Saturdays. Recheck in 4 weeks. Main #  [phone], Coumadin Clinic # [phone].      

## 2017-12-07 NOTE — Telephone Encounter (Signed)
Copied from [place] [id number]. Topic: Quick Communication - Rx Refill/Question >> Dec 07, 2017 11:13 AM Boyd Kerbs wrote: Medication:   citalopram (CELEXA) 20 MG tablet  Pt. Is asking if dr. Elease Hashimoto can put prescription in for 90 day supply instead of the 30 day  Has the patient contacted their pharmacy? No. (Agent: If no, request that the patient contact the pharmacy for the refill.) Preferred Pharmacy (with phone number or street name):   [facility], [place] - [address] [place] [place] [address] Phone: [phone] Fax: [phone]   Agent: Please be advised that RX refills may take up to 3 business days. We ask that you follow-up with your pharmacy.

## 2018-01-04 ENCOUNTER — Ambulatory Visit (INDEPENDENT_AMBULATORY_CARE_PROVIDER_SITE_OTHER): Payer: Medicare HMO | Admitting: *Deleted

## 2018-01-04 DIAGNOSIS — Z5181 Encounter for therapeutic drug level monitoring: Secondary | ICD-10-CM | POA: Diagnosis not present

## 2018-01-04 DIAGNOSIS — I4891 Unspecified atrial fibrillation: Secondary | ICD-10-CM | POA: Diagnosis not present

## 2018-01-04 DIAGNOSIS — Z9889 Other specified postprocedural states: Secondary | ICD-10-CM

## 2018-01-04 LAB — POCT INR: INR: 2.9 (ref 2.0–3.0)

## 2018-01-04 NOTE — Patient Instructions (Signed)
Description   Continue taking 5mg daily except 7.5mg on Tuesdays and Saturdays. Recheck in 4 weeks. Main #  [phone], Coumadin Clinic # [phone].      

## 2018-01-06 DIAGNOSIS — M25561 Pain in right knee: Secondary | ICD-10-CM | POA: Diagnosis not present

## 2018-01-10 ENCOUNTER — Other Ambulatory Visit: Payer: Self-pay | Admitting: Family Medicine

## 2018-01-11 ENCOUNTER — Other Ambulatory Visit: Payer: Self-pay | Admitting: Family Medicine

## 2018-01-11 NOTE — Telephone Encounter (Signed)
Pt needs to scheduled an OV for further refills.

## 2018-01-11 NOTE — Telephone Encounter (Signed)
Copied from [id number]. Topic: Quick Communication - Rx Refill/Question >> Jan 11, 2018  9:22 AM Synthia Innocent wrote: Medication: citalopram (CELEXA) 20 MG tablet, 90 day supply  Has the patient contacted their pharmacy? Yes.  But told to call and make [place] aware of 90 day supply (Agent: If no, request that the patient contact the pharmacy for the refill.) (Agent: If yes, when and what did the pharmacy advise?)  Preferred Pharmacy (with phone number or street name): [facility] on [address]  Agent: Please be advised that RX refills may take up to 3 business days. We ask that you follow-up with your pharmacy.

## 2018-01-16 ENCOUNTER — Other Ambulatory Visit: Payer: Self-pay | Admitting: Cardiovascular Disease

## 2018-02-02 ENCOUNTER — Ambulatory Visit (INDEPENDENT_AMBULATORY_CARE_PROVIDER_SITE_OTHER): Payer: Medicare HMO | Admitting: *Deleted

## 2018-02-02 ENCOUNTER — Encounter: Payer: Medicare HMO | Admitting: Cardiothoracic Surgery

## 2018-02-02 DIAGNOSIS — Z9889 Other specified postprocedural states: Secondary | ICD-10-CM | POA: Diagnosis not present

## 2018-02-02 DIAGNOSIS — I4891 Unspecified atrial fibrillation: Secondary | ICD-10-CM | POA: Diagnosis not present

## 2018-02-02 DIAGNOSIS — Z5181 Encounter for therapeutic drug level monitoring: Secondary | ICD-10-CM

## 2018-02-02 LAB — POCT INR: INR: 2.3 (ref 2.0–3.0)

## 2018-02-02 NOTE — Patient Instructions (Signed)
Description   Continue taking 5mg  daily except 7.5mg  on Tuesdays and Saturdays. Recheck in 4 weeks. Main #  [phone], Coumadin Clinic # [phone].

## 2018-02-03 ENCOUNTER — Encounter: Payer: Self-pay | Admitting: Cardiothoracic Surgery

## 2018-02-03 ENCOUNTER — Ambulatory Visit: Payer: Medicare HMO | Admitting: Cardiothoracic Surgery

## 2018-02-03 ENCOUNTER — Other Ambulatory Visit: Payer: Self-pay | Admitting: *Deleted

## 2018-02-03 VITALS — BP 121/62 | HR 60 | Resp 20 | Ht 68.0 in | Wt 183.0 lb

## 2018-02-03 DIAGNOSIS — I7101 Dissection of thoracic aorta: Secondary | ICD-10-CM

## 2018-02-03 DIAGNOSIS — I71019 Dissection of thoracic aorta, unspecified: Secondary | ICD-10-CM

## 2018-02-03 NOTE — Patient Instructions (Signed)

## 2018-02-03 NOTE — Progress Notes (Signed)
[address]       [place], [address]             [phone]      Schneider W Wisner Boyce Medical Record #[id number] Date of Birth: 1940/10/22  Referring: Lacretia Leigh, MD Primary Care: Eulas Post, MD Primary Cardiology:No primary care provider on file.  \ Chief Complaint:   POST OP FOLLOW UP 05/08/2017 OPERATIVE REPORT PREOPERATIVE DIAGNOSIS:  Acute type 1 aortic dissection. POSTOPERATIVE DIAGNOSIS:  Acute type 1 aortic dissection. SURGICAL PROCEDURES: 1. Cardiopulmonary bypass with hypothermic circulatory arrest and     antegrade cerebral perfusion. 2. Resuspension, repair of aortic valve, replacement of ascending     aorta, arch with debranching of the cerebral vessels, frozen     elephant trunk with placement of 31 x 31 x 10 cm CTAG Gore stent     graft, placement of a 10 x 39 VBX balloon expandable stent into the     left subclavian artery, a 12 x 8-cm graft off the ascending aorta     to the innominate and left carotid arteries.    History of Present Illness:     Patient returns to the office today in follow-up visit after acute aortic dissection in October 2018.he was treated with frozen elephant trunk repair of aortic valve replacement of ascending aorta and deep branches of the cerebral vessels and stenting takeoff of the left subclavian artery.    Patient continues to do well without obvious sequelae, initial dizziness after surgery has resolved.  He is returned to near normal activities.  Still goes to the gym, he has been cautioned about weight lifting.  Was found to be hit positive postop, is on Coumadin for paroxysmal postop atrial fibrillation and also thrombosis of the left subclavian vein.    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 ADJ DISORDER WITH MIXED ANXIETY &  DEPRESSED MOOD 09/08/2009   Qualifier: Diagnosis of  By: Elease Hashimoto MD, Bruce    . Arthritis   . Atrial fibrillation ([place]) [I48.91] 05/23/2017  . Blood in the stool   . BRADYCARDIA 03/10/2010   Qualifier: Diagnosis of  By: Elease Hashimoto MD, Bruce    . CHEST PAIN 03/19/2010   Qualifier: Diagnosis of  By: Allean Found, LPN, Christine    . Chicken pox   . COLONIC POLYPS, HX OF 01/13/2009   Qualifier: Diagnosis of  By: Valma Cava LPN, Izora Gala    . Episode of generalized weakness 02/17/2016  . GERD (gastroesophageal reflux disease) 10/03/2012  . Hay fever   . History of colonic polyps   . History of colonoscopy   . Hyperlipidemia   . MUSCLE STRAIN, HAMSTRING MUSCLE 09/08/2009   Qualifier: Diagnosis of  By: Elease Hashimoto MD, Bruce    . ORTHOSTATIC DIZZINESS 03/05/2010   Qualifier: Diagnosis of  By: Elease Hashimoto MD, Bruce    . Other premature beats 03/17/2010   Qualifier: Diagnosis of  By: Lovena Le, MD, Martyn Malay   . Premature ventricular contraction 01/05/2011  . S/P aortic dissection repair 05/08/2017  . THROMBOCYTOPENIA 03/05/2010   Qualifier: Diagnosis of  By: Elease Hashimoto MD, Bruce    . Weakness 12/09/2015     Social History   Tobacco Use  Smoking Status Former Smoker  . Last attempt to quit: 08/09/1974  . Years since quitting:  43.5  Smokeless Tobacco Never Used    Social History   Substance and Sexual Activity  Alcohol Use No     Allergies  Allergen Reactions  . 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 aspirin 81 MG tablet Take 1 tablet (81 mg total) by mouth daily. 30 tablet   . citalopram (CELEXA) 20 MG tablet Take 1 tablet (20 mg total) by mouth daily. 30 tablet 0  . metoprolol tartrate (LOPRESSOR) 25 MG tablet Take 0.5 tablets (12.5 mg total) by mouth 2 (two) times daily. 90 tablet 3  . Misc Natural Products (GLUCOS-CHONDROIT-MSM COMPLEX) TABS Take 1 tablet by mouth 3 (three) times daily.    .  pantoprazole (PROTONIX) 40 MG tablet Take 1 tablet (40 mg total) by mouth daily. As needed for heartburn 30 tablet 0  . simvastatin (ZOCOR) 20 MG tablet TAKE 1 TABLET BY MOUTH AT BEDTIME 90 tablet 0  . warfarin (COUMADIN) 2.5 MG tablet Take as directed by coumadin clinic 75 tablet 3   No current facility-administered medications for this visit.        Physical Exam: BP 121/62   Pulse 60   Resp 20   Ht 5\' 8"  (1.727 m)   Wt 183 lb (83 kg)   SpO2 97% Comment: RA  BMI 27.83 kg/m  General appearance: alert and cooperative Head: Normocephalic, without obvious abnormality, atraumatic Neck: no adenopathy, no carotid bruit, no JVD, supple, symmetrical, trachea midline and thyroid not enlarged, symmetric, no tenderness/mass/nodules Resp: clear to auscultation bilaterally Back: symmetric, no curvature. ROM normal. No CVA tenderness. Cardio: regular rate and rhythm, S1, S2 normal, no murmur, click, rub or gallop GI: soft, non-tender; bowel sounds normal; no masses,  no organomegaly Extremities: extremities normal, atraumatic, no cyanosis or edema Neurologic: Grossly normal  Diagnostic Studies & Laboratory data:     Recent Radiology Findings:  No results found. Dg Chest 2 View  Result Date: 07/14/2017 CLINICAL DATA:  Aortic dissection repair. EXAM: CHEST  2 VIEW COMPARISON:  CT 08/29/2016 FINDINGS: Median sternotomy. Thoracic aortic stent graft noted in stable the. Heart size normal. No focal infiltrate. No pleural effusion or pneumothorax. Surgical clips noted over the right upper chest. Improved left base subsegmental atelectasis with mild residual. Resolution of left pleural effusion. No pneumothorax . IMPRESSION: 1. Prior median sternotomy. Aortic stent graft. Aortic stent graft appears stable. Heart size stable. 2. Improved left base subsegmental atelectasis. Interim resolution of left pleural effusion. Electronically Signed   By: Marcello Moores  Register   On: 07/14/2017 14:14     Ct Coronary  Morph W/cta Cor Nancy Fetter W/ca W/cm &/or Wo/cm  Addendum Date: 06/29/2017   ADDENDUM REPORT: 06/29/2017 14:03 CLINICAL DATA:  Post Type A Dissection EXAM: Cardiac CTA MEDICATIONS: Sub lingual nitro. 4mg  and lopressor 5mg  TECHNIQUE: The patient was scanned on a Siemens 400 slice scanner. Gantry rotation speed was 250 msecs. Collimation was . 6 mm . A 100 kV prospective scan was supposed to be triggered in the ascending aorta but appears to have started early with ROI in SVC. The 3D data set was interpreted on a dedicated work station using MPR, MIP and VRT modes. A total of 80cc of contrast was used. FINDINGS: Non-cardiac: See separate report from [facility]. No significant findings on limited lung and soft tissue windows. Coronary Arteries: Right dominant with no anomalies LM:  Less than 30% calcified disease in distal vessel  LAD:  Less than 50% calcified disease in proximal and mid vessel D1:  Less than 30% calcified disease in ostial vessel D2:  Less than 30% calcified disease in ostial vessel Circumflex: Appears anomalous from RCA Courses behind aorta with no significant stenosis OM1:  Normal RCA:  Less than 30% calcified disease in proximal and mid RCA PDA:  Normal PLA:  Normal IMPRESSION: 1) No hemodynamically significant CAD. 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   On: 06/29/2017 14:03   Result Date: 06/29/2017 EXAM: OVER-READ INTERPRETATION  CT CHEST The following report is an over-read performed by radiologist Dr. Collene Leyden [facility], [place] on 06/29/2017. This over-read does not include interpretation of cardiac or coronary anatomy or pathology. The coronary CTA interpretation by the cardiologist is attached. COMPARISON:  CT 05/08/2017 FINDINGS: Vascular: Previously seen aortic dissection again noted  in the descending thoracic aorta below the stent graft which extends from the distal aortic arch into the mid descending thoracic aorta prior tube graft repair of the ascending thoracic aorta without dissection. Dissection flap again noted within the left common carotid artery, no longer visualized in the brachiocephalic artery. Stents noted within the left subclavian artery. There is mild cardiomegaly. Mediastinum/Nodes: No mediastinal, hilar, or axillary adenopathy. Trachea and esophagus are unremarkable. Lungs/Pleura: Moderate left pleural effusion. Minimal compressive atelectasis in the lower lobe. No confluent airspace opacity otherwise. Upper Abdomen: Imaging into the upper abdomen shows no acute findings. Musculoskeletal: Chest wall soft tissues are unremarkable. No acute bony abnormality. IMPRESSION: Evidence of prior repair of the type day aortic dissection with tube graft repair in the ascending thoracic aorta and stent graft repair in the distal arch and descending thoracic aorta. The dissection flap again noted, extending from the distal aspect of the stent graft into the upper abdomen. Prior repair of dissection within the brachycephalic artery and stenting of the left subclavian artery. Dissection flap continues to be noted in the left carotid artery. Moderate left pleural effusion with left lower lobe atelectasis. Electronically Signed: By: Rolm Baptise M.D. On: 06/29/2017 10:22     Recent Lab Findings: Lab Results  Component Value Date   WBC 7.8 09/17/2017   HGB 14.1 09/17/2017   HCT 43.2 09/17/2017   PLT 171 09/17/2017   GLUCOSE 102 (H) 09/17/2017   CHOL 128 12/09/2015   TRIG 58 12/09/2015   HDL 60 12/09/2015   LDLCALC 56 12/09/2015   ALT 11 06/01/2017   AST 13 06/01/2017   NA 136 09/17/2017   K 4.2 09/17/2017   CL 100 (L) 09/17/2017   CREATININE 1.15 09/17/2017   BUN 15 09/17/2017   CO2 25 09/17/2017   TSH 5.980 (H) 06/01/2017   INR 2.3 02/02/2018   HGBA1C 6.1 (H)  12/09/2015                           *[facility]*                        [address]                        [place], [place] [address]  [phone]  ------------------------------------------------------------------- Transthoracic Echocardiography  Patient:    Zyheir, Daft MR #:       [id number] Study Date: 06/07/2017 Gender:     M Age:        77 Height:     172.7 cm Weight:     76.6 kg BSA:        1.93 m^2 Pt. Status: Room:   ATTENDING    Candee Furbish, M.D.  Windell Moment, Marlane Hatcher  REFERRING    Burtis Junes  PERFORMING   [facility], Outpatient  SONOGRAPHER  [facility], RDCS  cc:  ------------------------------------------------------------------- LV EF: 55% -   60%  ------------------------------------------------------------------- Indications:      Aortic Dissection (I71.0).  ------------------------------------------------------------------- History:   PMH:  Complex type A aortic dissection extending from the aortic root to aortic bifurcation with the dissection flap approaching the margin of coronary sinuses but not extending into the coronary arteries. The dissection flap extended into proximal right brachiocephalic artery, left subclavian artery, left common carotid artery, and proximal common iliac arteries bilaterally. AAA up to 5.3 cm. Status Post Dissection Repair  Dyspnea.  Atrial fibrillation.  Risk factors:  Dizziness, Bradycardia, Palpitations Family history of coronary artery disease. Former tobacco use. Dyslipidemia.  ------------------------------------------------------------------- Study Conclusions  - Left ventricle: The cavity size was normal. Wall thickness was   normal. Systolic function was normal. The estimated ejection   fraction was in the range of 55% to 60%. Wall motion was normal;   there were no regional wall motion abnormalities. Doppler   parameters are consistent  with abnormal left ventricular   relaxation (grade 1 diastolic dysfunction). - Aorta: Ascending aortic diameter: 37 mm (S). - Aortic root: The aortic root was normal in size. s/p dissection   repair.  ------------------------------------------------------------------- Study data:  Comparison was made to the study of 05/08/2017.  Study status:  Routine.  Procedure:  Transthoracic echocardiography. Image quality was adequate.          Transthoracic echocardiography.  M-mode, limited 2D, limited spectral Doppler, and color Doppler.  Birthdate:  Patient birthdate: January 28, 1941. Age:  Patient is 77 yr old.  Sex:  Gender: male.    BMI: 25.7 kg/m^2.  Blood pressure:     115/71  Patient status:  Outpatient. Study date:  Study date: 06/07/2017. Study time: 02:19 PM. Location:  [facility]  -------------------------------------------------------------------  ------------------------------------------------------------------- Left ventricle:  The cavity size was normal. Wall thickness was normal. Systolic function was normal. The estimated ejection fraction was in the range of 55% to 60%. Wall motion was normal; there were no regional wall motion abnormalities. Doppler parameters are consistent with abnormal left ventricular relaxation (grade 1 diastolic dysfunction).  ------------------------------------------------------------------- Aortic valve:   Trileaflet; normal thickness leaflets. Mobility was not restricted.  Doppler:  Transvalvular velocity was within the normal range. There was no stenosis. There was no regurgitation.   ------------------------------------------------------------------- Aorta:  Aortic root: The aortic root was normal in size. s/p dissection repair.  ------------------------------------------------------------------- Mitral valve:   Structurally normal valve.   Mobility was not restricted.  Doppler:  Transvalvular velocity was within the normal range.  There was no evidence for stenosis. There was no regurgitation.  ------------------------------------------------------------------- Left atrium:  The atrium was normal in size.  ------------------------------------------------------------------- Right ventricle:  The cavity size was normal. Wall thickness was normal. Systolic function was normal.  ------------------------------------------------------------------- Pulmonic valve:    Structurally normal valve.   Cusp separation was normal.  Doppler:  Transvalvular velocity  was within the normal range. There was no evidence for stenosis. There was no regurgitation.  ------------------------------------------------------------------- Tricuspid valve:   Structurally normal valve.    Doppler: Transvalvular velocity was within the normal range. There was no regurgitation.  ------------------------------------------------------------------- Pulmonary artery:   The main pulmonary artery was normal-sized. Systolic pressure was within the normal range.  ------------------------------------------------------------------- Right atrium:  The atrium was normal in size.  ------------------------------------------------------------------- Pericardium:  There was no pericardial effusion.  ------------------------------------------------------------------- Systemic veins: Inferior vena cava: The vessel was normal in size.  ------------------------------------------------------------------- Measurements   Left ventricle                         Value        Reference  LV ID, ED, PLAX chordal        (L)     39.1  mm     43 - 52  LV ID, ES, PLAX chordal                24.6  mm     23 - 38  LV fx shortening, PLAX chordal         37    %      >=29  LV PW thickness, ED                    11.6  mm     ----------  IVS/LV PW ratio, ED                    0.69         <=1.3  Stroke volume, 2D                      77    ml     ----------   Stroke volume/bsa, 2D                  40    ml/m^2 ----------  LV e&', lateral                         12    cm/s   ----------  LV E/e&', lateral                       4.24         ----------  LV e&', medial                          6.86  cm/s   ----------  LV E/e&', medial                        7.42         ----------  LV e&', average                         9.43  cm/s   ----------  LV E/e&', average                       5.4          ----------    Ventricular septum                     Value        Reference  IVS thickness, ED  8.01  mm     ----------    LVOT                                   Value        Reference  LVOT ID, S                             25    mm     ----------  LVOT area                              4.91  cm^2   ----------  LVOT peak velocity, S                  76.9  cm/s   ----------  LVOT mean velocity, S                  48.1  cm/s   ----------  LVOT VTI, S                            15.7  cm     ----------    Aorta                                  Value        Reference  Aortic root ID, ED                     36    mm     ----------  Ascending aorta ID, A-P, S             37    mm     ----------    Left atrium                            Value        Reference  LA ID, A-P, ES                         33    mm     ----------  LA ID/bsa, A-P                         1.71  cm/m^2 <=2.2  LA volume, ES, 1-p A4C                 19.8  ml     ----------  LA volume/bsa, ES, 1-p A4C             10.3  ml/m^2 ----------    Mitral valve                           Value        Reference  Mitral E-wave peak velocity            50.9  cm/s   ----------  Mitral A-wave peak velocity            54.8  cm/s   ----------  Mitral deceleration time       (H)  232   ms     150 - 230  Mitral E/A ratio, peak                 0.9          ----------    Right atrium                           Value        Reference  RA ID, S-I, ES, A4C            (H)     51.3  mm      34 - 49  RA area, ES, A4C                       16    cm^2   8.3 - 19.5  RA volume, ES, A/L                     42.2  ml     ----------  RA volume/bsa, ES, A/L                 21.9  ml/m^2 ----------    Right ventricle                        Value        Reference  TAPSE                                  10.9  mm     ----------  RV s&', lateral, S                      7.34  cm/s   ----------  Legend: (L)  and  (H)  mark values outside specified reference range.  ------------------------------------------------------------------- Prepared and Electronically Authenticated by  Candee Furbish, M.D. 2018-10-30T16:35:46   Assessment / Plan:    Patient remains stable with good blood pressure control since his acute aortic dissection repair in October 2018. 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

## 2018-02-06 ENCOUNTER — Other Ambulatory Visit: Payer: Self-pay | Admitting: *Deleted

## 2018-02-06 DIAGNOSIS — Z9889 Other specified postprocedural states: Secondary | ICD-10-CM

## 2018-02-08 ENCOUNTER — Encounter: Payer: Self-pay | Admitting: Cardiothoracic Surgery

## 2018-02-17 DIAGNOSIS — M25561 Pain in right knee: Secondary | ICD-10-CM | POA: Diagnosis not present

## 2018-02-20 ENCOUNTER — Encounter: Payer: Self-pay | Admitting: Cardiothoracic Surgery

## 2018-02-21 DIAGNOSIS — H26492 Other secondary cataract, left eye: Secondary | ICD-10-CM | POA: Diagnosis not present

## 2018-02-24 ENCOUNTER — Other Ambulatory Visit (HOSPITAL_COMMUNITY): Payer: Self-pay | Admitting: Orthopedic Surgery

## 2018-02-24 ENCOUNTER — Ambulatory Visit (HOSPITAL_COMMUNITY)
Admission: RE | Admit: 2018-02-24 | Discharge: 2018-02-24 | Disposition: A | Discharge status: Home / Self Care | Payer: Medicare HMO | Source: Ambulatory Visit | Attending: Vascular Surgery | Admitting: Vascular Surgery

## 2018-02-24 DIAGNOSIS — I724 Aneurysm of artery of lower extremity: Secondary | ICD-10-CM | POA: Insufficient documentation

## 2018-02-27 ENCOUNTER — Ambulatory Visit (INDEPENDENT_AMBULATORY_CARE_PROVIDER_SITE_OTHER): Payer: Medicare HMO | Admitting: Vascular Surgery

## 2018-02-27 ENCOUNTER — Other Ambulatory Visit: Payer: Self-pay

## 2018-02-27 ENCOUNTER — Encounter: Payer: Self-pay | Admitting: Vascular Surgery

## 2018-02-27 VITALS — BP 137/69 | HR 61 | Temp 98.0°F | Resp 18 | Ht 68.0 in | Wt 180.0 lb

## 2018-02-27 DIAGNOSIS — I724 Aneurysm of artery of lower extremity: Secondary | ICD-10-CM

## 2018-02-27 NOTE — Progress Notes (Signed)
Patient name: Don Medina MRN: [id number] DOB: 08/16/1940 Sex: male   REASON FOR CONSULT:    Right popliteal artery aneurysm by MRI.  The consult is requested by Dr. Gaynelle Arabian.   HPI:   Don Medina is a pleasant 77 y.o. male, who is referred with a small right popliteal artery aneurysm.  He was having some right knee pain which prompted an MRI.  An incidental finding was a small right popliteal artery aneurysm.  He is referred for vascular consultation.  The patient is status post repair of an aortic dissection by Dr. Ceasar Mons.  He is done excellent from that standpoint.  He denies any history of claudication, rest pain, or nonhealing ulcers.  He is quite active and exercises often.  He is not a smoker.  He had some atrial fibrillation apparently postoperatively after his dissection was repaired however he is now in normal sinus rhythm.  However he is to remain on Coumadin for 1 year according to his cardiologist Dr. Jenkins Rouge.  Past Medical History:  Diagnosis Date  . 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 Arthritis   . Atrial fibrillation ([place]) [I48.91] 05/23/2017  . Blood in the stool   . BRADYCARDIA 03/10/2010   Qualifier: Diagnosis of  By: Elease Hashimoto MD, Bruce    . CHEST PAIN 03/19/2010   Qualifier: Diagnosis of  By: Allean Found, LPN, Christine    . Chicken pox   . COLONIC POLYPS, HX OF 01/13/2009   Qualifier: Diagnosis of  By: Valma Cava LPN, Izora Gala    . Episode of generalized weakness 02/17/2016  . GERD (gastroesophageal reflux disease) 10/03/2012  . Hay fever   . History of colonic polyps   . History of colonoscopy   . Hyperlipidemia   . MUSCLE STRAIN, HAMSTRING MUSCLE 09/08/2009   Qualifier: Diagnosis of  By: Elease Hashimoto MD, Bruce    . ORTHOSTATIC DIZZINESS 03/05/2010   Qualifier: Diagnosis of  By: Elease Hashimoto MD, Bruce    . Other premature beats 03/17/2010   Qualifier: Diagnosis of  By:  Lovena Le, MD, Martyn Malay   . Premature ventricular contraction 01/05/2011  . S/P aortic dissection repair 05/08/2017  . THROMBOCYTOPENIA 03/05/2010   Qualifier: Diagnosis of  By: Elease Hashimoto MD, Bruce    . Weakness 12/09/2015    Family History  Problem Relation Age of Onset  . Alzheimer's disease Father 71  . Dementia Father   . Stroke Mother 29  . Cancer Brother        Leukemia  . Sudden death Other        family  . Hypertension Unknown   . Heart disease Neg Hx     SOCIAL HISTORY: Social History   Socioeconomic History  . Marital status: Married    Spouse name: Not on file  . Number of children: Not on file  . Years of education: Not on file  . Highest education level: Not on file  Occupational History  . Occupation: Retired  Scientific laboratory technician  . Financial resource strain: Not on file  . Food insecurity:    Worry: Not on file    Inability: Not on file  . Transportation needs:    Medical: Not on file    Non-medical: Not on file  Tobacco Use  . Smoking status: Former Smoker    Last attempt to quit: 08/09/1974    Years since quitting: 43.5  .  Smokeless tobacco: Never Used  Substance and Sexual Activity  . Alcohol use: No  . Drug use: No  . Sexual activity: Not Currently  Lifestyle  . Physical activity:    Days per week: 4 days    Minutes per session: 30 min  . Stress: Not on file  Relationships  . Social connections:    Talks on phone: Not on file    Gets together: Not on file    Attends religious service: Not on file    Active member of club or organization: Not on file    Attends meetings of clubs or organizations: Not on file    Relationship status: Not on file  . Intimate partner violence:    Fear of current or ex partner: Not on file    Emotionally abused: Not on file    Physically abused: Not on file    Forced sexual activity: Not on file  Other Topics Concern  . Not on file  Social History Narrative  . Not on file    Allergies  Allergen  Reactions  . 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 aspirin 81 MG tablet Take 1 tablet (81 mg total) by mouth daily. 30 tablet   . citalopram (CELEXA) 20 MG tablet Take 1 tablet (20 mg total) by mouth daily. 30 tablet 0  . metoprolol tartrate (LOPRESSOR) 25 MG tablet Take 0.5 tablets (12.5 mg total) by mouth 2 (two) times daily. 90 tablet 3  . Misc Natural Products (GLUCOS-CHONDROIT-MSM COMPLEX) TABS Take 1 tablet by mouth 3 (three) times daily.    . pantoprazole (PROTONIX) 40 MG tablet Take 1 tablet (40 mg total) by mouth daily. As needed for heartburn 30 tablet 0  . simvastatin (ZOCOR) 20 MG tablet TAKE 1 TABLET BY MOUTH AT BEDTIME 90 tablet 0  . warfarin (COUMADIN) 2.5 MG tablet Take as directed by coumadin clinic 75 tablet 3   No current facility-administered medications for this visit.     REVIEW OF SYSTEMS:  [X]  denotes positive finding, [ ]  denotes negative finding Cardiac  Comments:  Chest pain or chest pressure:    Shortness of breath upon exertion:    Short of breath when lying flat:    Irregular heart rhythm:        Vascular    Pain in calf, thigh, or hip brought on by ambulation:    Pain in feet at night that wakes you up from your sleep:     Blood clot in your veins:    Leg swelling:         Pulmonary    Oxygen at home:    Productive cough:     Wheezing:         Neurologic    Sudden weakness in arms or legs:     Sudden numbness in arms or legs:     Sudden onset of difficulty speaking or slurred speech:    Temporary loss of vision in one eye:     Problems with dizziness:  x       Gastrointestinal    Blood in stool:     Vomited blood:         Genitourinary    Burning when urinating:     Blood in urine:        Psychiatric    Major depression:         Hematologic  Bleeding problems:    Problems with blood clotting too easily:        Skin    Rashes or  ulcers:        Constitutional    Fever or chills:     PHYSICAL EXAM:   Vitals:   02/27/18 0843  BP: 137/69  Pulse: 61  Resp: 18  Temp: 98 F (36.7 C)  TempSrc: Oral  SpO2: 95%  Weight: 180 lb (81.6 kg)  Height: 5\' 8"  (1.727 m)    GENERAL: The patient is a well-nourished male, in no acute distress. The vital signs are documented above. CARDIAC: There is a regular rate and rhythm.  VASCULAR: I do not detect carotid bruits. He has palpable femoral, popliteal, and pedal pulses bilaterally.  The right popliteal pulse is prominent. PULMONARY: There is good air exchange bilaterally without wheezing or rales. ABDOMEN: Soft and non-tender with normal pitched bowel sounds.  I do not palpate an abdominal aortic aneurysm. MUSCULOSKELETAL: There are no major deformities or cyanosis. NEUROLOGIC: No focal weakness or paresthesias are detected. SKIN: There are no ulcers or rashes noted. PSYCHIATRIC: The patient has a normal affect.  DATA:    LABS: His INR on 02/02/2018 was 2.3.  GFR on 09/17/2017 was greater than 60.  Creatinine was 1.15.  Most recent platelet count on 09/17/2017 was 171,000.  CT Angiogram: I reviewed his CT angiogram that was done on 09/17/2017.  This shows no evidence of abdominal aortic aneurysm.  The dissection extends down the abdominal aorta into the common iliac artery.  The right superficial femoral artery measures approximately a centimeter in diameter and thus the right popliteal artery is somewhat bigger but not significantly larger.  MRI RIGHT KNEE: I have reviewed the MRI that was done of the right knee.  This showed a long segment aneurysmal dilatation of the popliteal artery measuring 1.6 cm in maximum diameter.  MEDICAL ISSUES:   RIGHT POPLITEAL ARTERY ANEURYSM: This patient has a fairly small right popliteal artery aneurysm.  This is 1.6 cm in maximum diameter.  At this point I do not think elective repair is indicated.  He is currently on Coumadin and he does not  have significant laminated thrombus in the popliteal artery.  I have recommended a follow-up duplex scan in 6 months and I will see him back at that time.  Although he does not have a prominent left popliteal pulse we will obtain a duplex of the left popliteal artery also.  I will see him back in 6 months.  He knows to call sooner if he has problems.  Deitra Mayo [facility] [phone]

## 2018-03-02 ENCOUNTER — Ambulatory Visit (INDEPENDENT_AMBULATORY_CARE_PROVIDER_SITE_OTHER): Payer: Medicare HMO | Admitting: *Deleted

## 2018-03-02 DIAGNOSIS — I4891 Unspecified atrial fibrillation: Secondary | ICD-10-CM | POA: Diagnosis not present

## 2018-03-02 DIAGNOSIS — Z9889 Other specified postprocedural states: Secondary | ICD-10-CM | POA: Diagnosis not present

## 2018-03-02 DIAGNOSIS — Z5181 Encounter for therapeutic drug level monitoring: Secondary | ICD-10-CM

## 2018-03-02 LAB — POCT INR: INR: 1.8 — AB (ref 2.0–3.0)

## 2018-03-02 NOTE — Patient Instructions (Signed)
Description   Today take an extra 2.5mg , then Continue taking 5mg  daily except 7.5mg  on Tuesdays and Saturdays. Recheck in 3 weeks. Main #  [phone], Coumadin Clinic # [phone].

## 2018-03-06 ENCOUNTER — Encounter: Payer: Self-pay | Admitting: Vascular Surgery

## 2018-03-09 ENCOUNTER — Other Ambulatory Visit: Payer: Self-pay | Admitting: Cardiovascular Disease

## 2018-03-09 ENCOUNTER — Ambulatory Visit (HOSPITAL_COMMUNITY)
Admission: RE | Admit: 2018-03-09 | Discharge: 2018-03-09 | Disposition: A | Discharge status: Home / Self Care | Payer: Medicare HMO | Source: Ambulatory Visit | Attending: Cardiology | Admitting: Cardiology

## 2018-03-09 DIAGNOSIS — I6523 Occlusion and stenosis of bilateral carotid arteries: Secondary | ICD-10-CM

## 2018-03-09 DIAGNOSIS — Z9889 Other specified postprocedural states: Secondary | ICD-10-CM

## 2018-03-11 ENCOUNTER — Encounter: Payer: Self-pay | Admitting: Cardiovascular Disease

## 2018-03-16 ENCOUNTER — Other Ambulatory Visit: Payer: Self-pay | Admitting: *Deleted

## 2018-03-16 DIAGNOSIS — I71019 Dissection of thoracic aorta, unspecified: Secondary | ICD-10-CM

## 2018-03-16 DIAGNOSIS — I7101 Dissection of thoracic aorta: Secondary | ICD-10-CM

## 2018-03-20 ENCOUNTER — Other Ambulatory Visit: Payer: Self-pay | Admitting: Family Medicine

## 2018-03-20 ENCOUNTER — Other Ambulatory Visit: Payer: Self-pay

## 2018-03-20 DIAGNOSIS — I724 Aneurysm of artery of lower extremity: Secondary | ICD-10-CM

## 2018-03-20 MED ORDER — PANTOPRAZOLE SODIUM 40 MG PO TBEC: 40.0000 mg | DELAYED_RELEASE_TABLET | Freq: Every day | ORAL | 2 refills | Status: DC

## 2018-03-20 NOTE — Telephone Encounter (Signed)
Copied from Tina. Topic: Quick Communication - Rx Refill/Question >> Mar 20, 2018  8:34 AM Robina Ade, Helene Kelp D wrote: Medication: pantoprazole (PROTONIX) 40 MG tablet  Has the patient contacted their pharmacy?No, new rx pt would like. (Agent: If no, request that the patient contact the pharmacy for the refill.) (Agent: If yes, when and what did the pharmacy advise?)  Preferred Pharmacy (with phone number or street name): [place], [place] - [address]  Agent: Please be advised that RX refills may take up to 3 business days. We ask that you follow-up with your pharmacy.

## 2018-03-20 NOTE — Telephone Encounter (Signed)
LOV 05/23/17 Dr. Elease Hashimoto Last refill 09/17/17 # 30 with no refills by Dr. Cyril Mourning Ward

## 2018-03-20 NOTE — Telephone Encounter (Signed)
Dr. Elease Hashimoto--  Patient last seen by you on 05/27/2017 with no pending appointments.  Medication was last filled 09/17/2017 by Dr. Cyril Mourning ward for #30 with no refills.  Would you like to send refills to the pharmacy?  Please advise. Thanks

## 2018-03-20 NOTE — Telephone Encounter (Signed)
Refill for 3 months. 

## 2018-03-23 ENCOUNTER — Ambulatory Visit (INDEPENDENT_AMBULATORY_CARE_PROVIDER_SITE_OTHER): Payer: Medicare HMO

## 2018-03-23 DIAGNOSIS — Z9889 Other specified postprocedural states: Secondary | ICD-10-CM | POA: Diagnosis not present

## 2018-03-23 DIAGNOSIS — Z5181 Encounter for therapeutic drug level monitoring: Secondary | ICD-10-CM

## 2018-03-23 DIAGNOSIS — I4891 Unspecified atrial fibrillation: Secondary | ICD-10-CM | POA: Diagnosis not present

## 2018-03-23 LAB — POCT INR: INR: 1.9 — AB (ref 2.0–3.0)

## 2018-03-23 NOTE — Patient Instructions (Signed)
Description   Today take an extra 2.5mg , then Continue taking 5mg  daily except 7.5mg  on Tuesdays and Saturdays. Recheck in 4 weeks. Main #  [id number], Coumadin Clinic # [phone].

## 2018-03-30 DIAGNOSIS — R69 Illness, unspecified: Secondary | ICD-10-CM | POA: Diagnosis not present

## 2018-04-03 ENCOUNTER — Inpatient Hospital Stay (HOSPITAL_COMMUNITY): Admission: RE | Admit: 2018-04-03 | Payer: Medicare HMO | Source: Ambulatory Visit

## 2018-04-03 NOTE — Progress Notes (Signed)
CARDIOLOGY OFFICE NOTE  Date:  04/11/2018    Don Medina Date of Birth: 12/11/1940 Medical Record #[id number]  PCP:  Eulas Post, MD  Cardiologist:  Gillian Shields   No chief complaint on file.   History of Present Illness: Don Medina is a 77 y.o. male seen f/u  for acute type A aortic dissection.   He has a history of HLD and PVCs. No history of CAD, heart attack, HTN, HLD or DM. No prior tobacco use.  Had new onset chest and back pain    He was taken to the OR by Dr. Servando Snare 05/08/17 he underwent grafting of the ascending aorta above the sinus with resuspension of AV  Right innominate tied off. Balloon stent graft extending past left subclavian with graft to graft in ascending aorta bifucated to innominate and carotid and graft to graft left subclavian. 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   Post op echo reviewed 06/07/17 Ascending aorta 3.7 cm no AR EF 55-60%   Cardiac CTA 06/09/17 with no significant CAD stable repair   In ER 09/19/17 with chest pain R/O CXR and CT ok thinks it was gas better with belching   Had right popliteal aneurysm discovered on MRI for knee Seen by [facility] Dr Scot Dock and no surgery recommended at this Time repeat duplex January 2020   Bruising easy. Discussed need for lifelong coumadin   Past Medical History:  Diagnosis Date  . 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 Arthritis   . Atrial fibrillation ([place]) [I48.91] 05/23/2017  . Blood in the stool   . BRADYCARDIA 03/10/2010   Qualifier: Diagnosis of  By: Elease Hashimoto MD, Bruce    . CHEST PAIN 03/19/2010   Qualifier: Diagnosis of  By: Allean Found, LPN, Christine    . Chicken pox   . COLONIC POLYPS, HX OF 01/13/2009   Qualifier: Diagnosis of  By: Valma Cava LPN, Izora Gala    . Episode of generalized  weakness 02/17/2016  . GERD (gastroesophageal reflux disease) 10/03/2012  . Hay fever   . History of colonic polyps   . History of colonoscopy   . Hyperlipidemia   . MUSCLE STRAIN, HAMSTRING MUSCLE 09/08/2009   Qualifier: Diagnosis of  By: Elease Hashimoto MD, Bruce    . ORTHOSTATIC DIZZINESS 03/05/2010   Qualifier: Diagnosis of  By: Elease Hashimoto MD, Bruce    . Other premature beats 03/17/2010   Qualifier: Diagnosis of  By: Lovena Le, MD, Martyn Malay   . Premature ventricular contraction 01/05/2011  . S/P aortic dissection repair 05/08/2017  . THROMBOCYTOPENIA 03/05/2010   Qualifier: Diagnosis of  By: Elease Hashimoto MD, Bruce    . Weakness 12/09/2015    Past Surgical History:  Procedure Laterality Date  . ABDOMINAL ADHESION SURGERY  2007  . AORTIC INTERVENTION N/A 05/08/2017   Procedure: HYPOTHERMIC CIRCULATORY ARREST;  Surgeon: [name], MD;  Location: [place];  Service: Open Heart Surgery;  Laterality: N/A;  . ASCENDING AORTIC ROOT REPLACEMENT N/A 05/08/2017   Procedure: REPLACEMENT OF ASCENDING AORTIC ARCH;  Surgeon: [name], MD;  Location: [place];  Service: Open Heart Surgery;  Laterality: N/A;  . CANNULATION FOR CARDIOPULMONARY BYPASS Right 05/08/2017   Procedure: AXILLARY CANNULATION;  Surgeon: [name], MD;  Location: [place];  Service: Open  Heart Surgery;  Laterality: Right;  . ENDOVASCULAR REPAIR/STENT GRAFT  05/08/2017   Procedure: STENT OF LEFT SUBCLAVIAN ARTERY with 10x39VBX;  Surgeon: [name], MD;  Location: [place];  Service: Open Heart Surgery;;  . herniated diks surgery L-5  1996  . herniated disk surgery c 6-7  1986  . INTRAOPERATIVE TRANSESOPHAGEAL ECHOCARDIOGRAM N/A 05/08/2017   Procedure: INTRAOPERATIVE TRANSESOPHAGEAL ECHOCARDIOGRAM;  Surgeon: [name], MD;  Location: [facility] OR;  Service: Open Heart Surgery;  Laterality: N/A;  . REPLACEMENT ASCENDING AORTA N/A 05/08/2017   Procedure: REPLACEMENT ASCENDING AORTA;  Surgeon: [name], MD;   Location: [place];  Service: Open Heart Surgery;  Laterality: N/A;  . THORACIC AORTIC ENDOVASCULAR STENT GRAFT  05/08/2017   Procedure: STENT GRAFTING OF DESCENDING THORACIC AORTA;  Surgeon: [name], MD;  Location: [facility] OR;  Service: Open Heart Surgery;;     Medications: Current Meds  Medication Sig  . aspirin 81 MG tablet Take 1 tablet (81 mg total) by mouth daily.  . citalopram (CELEXA) 20 MG tablet Take 1 tablet (20 mg total) by mouth daily.  . metoprolol tartrate (LOPRESSOR) 25 MG tablet Take 0.5 tablets (12.5 mg total) by mouth 2 (two) times daily.  . Misc Natural Products (GLUCOS-CHONDROIT-MSM COMPLEX) TABS Take 1 tablet by mouth 3 (three) times daily.  . pantoprazole (PROTONIX) 40 MG tablet Take 1 tablet (40 mg total) by mouth daily. As needed for heartburn  . simvastatin (ZOCOR) 20 MG tablet TAKE 1 TABLET BY MOUTH AT BEDTIME  . warfarin (COUMADIN) 2.5 MG tablet Take as directed by coumadin clinic     Allergies: Allergies  Allergen Reactions  . 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 He has never used smokeless tobacco. He reports that he does not drink alcohol or use drugs.   Family History: The patient's family history includes Alzheimer's disease (age of onset: 49) in his father; Cancer in his brother; Dementia in his father; Hypertension in his unknown relative; Stroke (age of onset: 94) in his mother; Sudden death in his other.   Review of Systems: Please see the history of present illness.   Otherwise, the review of systems is positive for none.   All other systems are reviewed and negative.   Physical Exam: VS:  BP 130/60   Pulse (!) 56   Ht 5\' 8"  (1.727 m)   Wt 180 lb (81.6 kg)   BMI 27.37 kg/m  .  BMI Body mass index is 27.37 kg/m.  Wt Readings from Last 3 Encounters:  04/11/18 180 lb (81.6 kg)  02/27/18 180 lb (81.6 kg)  02/03/18 183 lb (83  kg)    Affect appropriate Healthy:  appears stated age 17: normal Neck supple with no adenopathy JVP normal no bruits no thyromegaly Lungs clear with no wheezing and good diaphragmatic motion Heart:  S1/S2 AR murmur, no rub, gallop or click PMI normal post sternotomy  Abdomen: benighn, BS positve, no tenderness, no AAA no bruit.  No HSM or HJR Distal pulses intact with no bruits No edema Neuro non-focal Skin warm and dry No muscular weakness    LABORATORY DATA:  EKG: 06/06/17  This shows NSR with inferolateral T wave changes. Inferior J point elevation  10/03/16  SR rate 56 LAE otherwise normal   Lab Results  Component Value Date   WBC 7.8 09/17/2017  HGB 14.1 09/17/2017   HCT 43.2 09/17/2017   PLT 171 09/17/2017   GLUCOSE 102 (H) 09/17/2017   CHOL 128 12/09/2015   TRIG 58 12/09/2015   HDL 60 12/09/2015   LDLCALC 56 12/09/2015   ALT 11 06/01/2017   AST 13 06/01/2017   NA 136 09/17/2017   K 4.2 09/17/2017   CL 100 (L) 09/17/2017   CREATININE 1.15 09/17/2017   BUN 15 09/17/2017   CO2 25 09/17/2017   TSH 5.980 (H) 06/01/2017   PSA 1.40 08/23/2013   INR 1.9 (A) 03/23/2018   HGBA1C 6.1 (H) 12/09/2015     BNP (last 3 results) No results for input(s): BNP in the last 8760 hours.  ProBNP (last 3 results) No results for input(s): PROBNP in the last 8760 hours.   Other Studies Reviewed Today:  TEE: 05/08/2017  Left ventricle: Normal cavity size and wall thickness. LV systolic function is normal with an EF of 55-60%. There are no obvious wall motion abnormalities.  Aortic valve: The valve is trileaflet. Severe regurgitation.  Aorta: The ascending aorta is dilated. 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  Right ventricle: Cavity is moderately to severely dilated. Moderately reduced systolic function.  Right atrium: Cavity is dilated.  Aorta: Graft present in the ascending  aorta.     Assessment/Plan:  1. Type A Aortic Dissection F/U Don Medina repair intact on cardiac CT 06/29/17 BP under good control  Review of TTE today shows mild AR normal EF and normal aortic root size with mild dilatation at the sinus level   2. Orthostatic hypotension/dizziness - improved with lower BP meds f/u carotid/vertebral duplex 6 months  3. Post op AF - now off amiodarone and coumadin maintaining NSR  Consider stopping coumadin if NSR year   4. +HIT noted PLT still done 09/17/17 171 f/u with primary   5. Popliteal Aneurysm:  Reviewed note from Dr Scot Dock [facility] 02/27/18 1.6 cm right popliteal aneurysm he suggested F/u duplex in 6 months (January) an no operative intervention needed at this time  Given PAF, popliteal aneurysm ( currently no mural thrombus to potentially embolize distally given anticoagulation ) And thrombosis of the left subclavian vein would suggest that life long coumadin appropriate      Jenkins Rouge

## 2018-04-11 ENCOUNTER — Other Ambulatory Visit: Payer: Self-pay | Admitting: Family Medicine

## 2018-04-11 ENCOUNTER — Other Ambulatory Visit: Payer: Self-pay

## 2018-04-11 ENCOUNTER — Ambulatory Visit: Payer: Medicare HMO | Admitting: Cardiovascular Disease

## 2018-04-11 ENCOUNTER — Ambulatory Visit (HOSPITAL_COMMUNITY): Payer: Medicare HMO | Attending: Cardiovascular Disease

## 2018-04-11 VITALS — BP 130/60 | HR 56 | Ht 68.0 in | Wt 180.0 lb

## 2018-04-11 DIAGNOSIS — I951 Orthostatic hypotension: Secondary | ICD-10-CM

## 2018-04-11 DIAGNOSIS — I724 Aneurysm of artery of lower extremity: Secondary | ICD-10-CM

## 2018-04-11 DIAGNOSIS — Z9889 Other specified postprocedural states: Secondary | ICD-10-CM | POA: Insufficient documentation

## 2018-04-11 DIAGNOSIS — E785 Hyperlipidemia, unspecified: Secondary | ICD-10-CM | POA: Diagnosis not present

## 2018-04-11 DIAGNOSIS — R001 Bradycardia, unspecified: Secondary | ICD-10-CM | POA: Diagnosis not present

## 2018-04-11 DIAGNOSIS — I4891 Unspecified atrial fibrillation: Secondary | ICD-10-CM | POA: Diagnosis not present

## 2018-04-11 LAB — ECHOCARDIOGRAM COMPLETE: Height: 68 in

## 2018-04-11 NOTE — Patient Instructions (Signed)

## 2018-04-12 ENCOUNTER — Encounter: Payer: Self-pay | Admitting: Gastroenterology

## 2018-04-14 MED ORDER — CITALOPRAM HYDROBROMIDE 20 MG PO TABS: 20.0000 mg | ORAL_TABLET | Freq: Every day | ORAL | 0 refills | Status: DC

## 2018-04-14 MED ORDER — SIMVASTATIN 20 MG PO TABS: 20.0000 mg | ORAL_TABLET | Freq: Every day | ORAL | 0 refills | Status: DC

## 2018-04-14 NOTE — Telephone Encounter (Addendum)
Patient will be out of meds today, requesting to be seen today. Able to work in or call in refill until he is able to make appt?

## 2018-04-14 NOTE — Telephone Encounter (Signed)
I called the pt and scheduled an appt for 9/24 and he is aware refills were sent to his pharmacy.

## 2018-04-14 NOTE — Addendum Note (Signed)
Addended by: Agnes Lawrence on: 04/14/2018 09:14 AM   Modules accepted: Orders

## 2018-04-20 ENCOUNTER — Ambulatory Visit (INDEPENDENT_AMBULATORY_CARE_PROVIDER_SITE_OTHER): Payer: Medicare HMO

## 2018-04-20 DIAGNOSIS — I4891 Unspecified atrial fibrillation: Secondary | ICD-10-CM

## 2018-04-20 DIAGNOSIS — Z9889 Other specified postprocedural states: Secondary | ICD-10-CM | POA: Diagnosis not present

## 2018-04-20 DIAGNOSIS — Z5181 Encounter for therapeutic drug level monitoring: Secondary | ICD-10-CM

## 2018-04-20 LAB — POCT INR: INR: 2.8 (ref 2.0–3.0)

## 2018-04-20 NOTE — Patient Instructions (Signed)
Description   Continue taking 5mg  daily except 7.5mg  on Tuesdays and Saturdays. Recheck in 4 weeks. Main #  [phone], Coumadin Clinic # [phone].

## 2018-05-02 ENCOUNTER — Encounter: Payer: Self-pay | Admitting: Family Medicine

## 2018-05-02 ENCOUNTER — Ambulatory Visit (INDEPENDENT_AMBULATORY_CARE_PROVIDER_SITE_OTHER): Payer: Medicare HMO | Admitting: Family Medicine

## 2018-05-02 ENCOUNTER — Other Ambulatory Visit: Payer: Self-pay

## 2018-05-02 VITALS — BP 110/68 | HR 57 | Temp 98.0°F | Ht 68.0 in | Wt 179.8 lb

## 2018-05-02 DIAGNOSIS — K219 Gastro-esophageal reflux disease without esophagitis: Secondary | ICD-10-CM | POA: Diagnosis not present

## 2018-05-02 DIAGNOSIS — Z9889 Other specified postprocedural states: Secondary | ICD-10-CM | POA: Diagnosis not present

## 2018-05-02 DIAGNOSIS — F4323 Adjustment disorder with mixed anxiety and depressed mood: Secondary | ICD-10-CM | POA: Diagnosis not present

## 2018-05-02 DIAGNOSIS — E785 Hyperlipidemia, unspecified: Secondary | ICD-10-CM | POA: Diagnosis not present

## 2018-05-02 DIAGNOSIS — R69 Illness, unspecified: Secondary | ICD-10-CM | POA: Diagnosis not present

## 2018-05-02 DIAGNOSIS — Z125 Encounter for screening for malignant neoplasm of prostate: Secondary | ICD-10-CM | POA: Diagnosis not present

## 2018-05-02 LAB — CBC WITH DIFFERENTIAL/PLATELET
BASOS ABS: 0 10*3/uL (ref 0.0–0.1)
BASOS PCT: 0.4 % (ref 0.0–3.0)
Eosinophils Absolute: 0.2 10*3/uL (ref 0.0–0.7)
Eosinophils Relative: 2.6 % (ref 0.0–5.0)
HEMATOCRIT: 43.7 % (ref 39.0–52.0)
HEMOGLOBIN: 14.9 g/dL (ref 13.0–17.0)
LYMPHS PCT: 45.4 % (ref 12.0–46.0)
Lymphs Abs: 2.9 10*3/uL (ref 0.7–4.0)
MCHC: 34.2 g/dL (ref 30.0–36.0)
MCV: 91 fl (ref 78.0–100.0)
MONOS PCT: 11 % (ref 3.0–12.0)
Monocytes Absolute: 0.7 10*3/uL (ref 0.1–1.0)
NEUTROS ABS: 2.6 10*3/uL (ref 1.4–7.7)
Neutrophils Relative %: 40.6 % — ABNORMAL LOW (ref 43.0–77.0)
PLATELETS: 145 10*3/uL — AB (ref 150.0–400.0)
RBC: 4.81 Mil/uL (ref 4.22–5.81)
RDW: 14.2 % (ref 11.5–15.5)
WBC: 6.3 10*3/uL (ref 4.0–10.5)

## 2018-05-02 LAB — COMPREHENSIVE METABOLIC PANEL
ALT: 13 U/L (ref 0–53)
AST: 22 U/L (ref 0–37)
Albumin: 4 g/dL (ref 3.5–5.2)
Alkaline Phosphatase: 44 U/L (ref 39–117)
BUN: 18 mg/dL (ref 6–23)
CALCIUM: 9.2 mg/dL (ref 8.4–10.5)
CHLORIDE: 103 meq/L (ref 96–112)
CO2: 28 meq/L (ref 19–32)
Creatinine, Ser: 1.2 mg/dL (ref 0.40–1.50)
GFR: 62.51 mL/min (ref >60.00)
Glucose, Bld: 100 mg/dL — ABNORMAL HIGH (ref 70–99)
Potassium: 4.4 mEq/L (ref 3.5–5.1)
SODIUM: 139 meq/L (ref 135–145)
Total Bilirubin: 0.8 mg/dL (ref 0.2–1.2)
Total Protein: 6.8 g/dL (ref 6.0–8.3)

## 2018-05-02 LAB — LIPID PANEL
CHOL/HDL RATIO: 3
Cholesterol: 140 mg/dL (ref 0–200)
HDL: 49.3 mg/dL (ref >39.00)
LDL CALC: 73 mg/dL (ref 0–99)
NONHDL: 90.84
Triglycerides: 87 mg/dL (ref 0.0–149.0)
VLDL: 17.4 mg/dL (ref 0.0–40.0)

## 2018-05-02 LAB — PSA, MEDICARE: PSA: 1.55 ng/ml (ref 0.10–4.00)

## 2018-05-02 MED ORDER — CITALOPRAM HYDROBROMIDE 20 MG PO TABS: 20.0000 mg | ORAL_TABLET | Freq: Every day | ORAL | 3 refills | Status: DC

## 2018-05-02 MED ORDER — SIMVASTATIN 20 MG PO TABS: 20.0000 mg | ORAL_TABLET | Freq: Every day | ORAL | 3 refills | Status: DC

## 2018-05-02 MED ORDER — PANTOPRAZOLE SODIUM 40 MG PO TBEC: 40.0000 mg | DELAYED_RELEASE_TABLET | Freq: Every day | ORAL | 3 refills | Status: DC

## 2018-05-02 NOTE — Progress Notes (Signed)
Subjective:     Patient ID: Don Medina, male   DOB: [birth year], 77 y.o.   MRN: [id number]  HPI Patient here for medical follow-up.  Last year he had aortic dissection repair and has done very well since that time.  His medications were reviewed.  He remains on Coumadin.  He had some atrial fibrillation postoperatively.  Denies any recent chest pains or dizziness.  He has hyperlipidemia treated with simvastatin.  No myalgias.  Has been exercising regularly.  He had episode of atypical chest pain during the past year and went to the ER and was placed on Protonix and symptoms improved.  He had a few breakthrough GERD symptoms since then we tried to discontinue Protonix.  He is taking Protonix daily.  No dysphagia.  No recent appetite or weight changes  History of some chronic anxiety currently stable on Celexa 20 mg daily.  He had mixed anxiety and depression in the past.  Requesting refills of Celexa.  He goes to the [place] yearly and plans to get his flu vaccine this year.  Pneumonia vaccines up-to-date  Past Medical History:  Diagnosis Date  . ADJ DISORDER WITH MIXED ANXIETY & DEPRESSED MOOD 09/08/2009   Qualifier: Diagnosis of  By: Caryl Never MD, Seham Gardenhire    . Arthritis   . Atrial fibrillation (HCC) [I48.91] 05/23/2017  . Blood in the stool   . BRADYCARDIA 03/10/2010   Qualifier: Diagnosis of  By: Caryl Never MD, Jamesyn Lindell    . CHEST PAIN 03/19/2010   Qualifier: Diagnosis of  By: Elyn Peers, LPN, Christine    . Chicken pox   . COLONIC POLYPS, HX OF 01/13/2009   Qualifier: Diagnosis of  By: Gabriel Rung LPN, Harriett Sine    . Episode of generalized weakness 02/17/2016  . GERD (gastroesophageal reflux disease) 10/03/2012  . Hay fever   . History of colonic polyps   . History of colonoscopy   . Hyperlipidemia   . MUSCLE STRAIN, HAMSTRING MUSCLE 09/08/2009   Qualifier: Diagnosis of  By: Caryl Never MD, Jayko Voorhees    . ORTHOSTATIC DIZZINESS 03/05/2010   Qualifier: Diagnosis of  By: Caryl Never MD, Yetta Marceaux    . Other premature  beats 03/17/2010   Qualifier: Diagnosis of  By: Ladona Ridgel, MD, Jerrell Mylar   . Premature ventricular contraction 01/05/2011  . S/P aortic dissection repair 05/08/2017  . THROMBOCYTOPENIA 03/05/2010   Qualifier: Diagnosis of  By: Caryl Never MD, Samel Bruna    . Weakness 12/09/2015   Past Surgical History:  Procedure Laterality Date  . ABDOMINAL ADHESION SURGERY  2007  . AORTIC INTERVENTION N/A 05/08/2017   Procedure: HYPOTHERMIC CIRCULATORY ARREST;  Surgeon: Delight Ovens, MD;  Location: [facility] OR;  Service: Open Heart Surgery;  Laterality: N/A;  . ASCENDING AORTIC ROOT REPLACEMENT N/A 05/08/2017   Procedure: REPLACEMENT OF ASCENDING AORTIC ARCH;  Surgeon: Delight Ovens, MD;  Location: [facility] OR;  Service: Open Heart Surgery;  Laterality: N/A;  . CANNULATION FOR CARDIOPULMONARY BYPASS Right 05/08/2017   Procedure: AXILLARY CANNULATION;  Surgeon: Delight Ovens, MD;  Location: [facility] OR;  Service: Open Heart Surgery;  Laterality: Right;  . ENDOVASCULAR REPAIR/STENT GRAFT  05/08/2017   Procedure: STENT OF LEFT SUBCLAVIAN ARTERY with [id number];  Surgeon: Delight Ovens, MD;  Location: [facility] OR;  Service: Open Heart Surgery;;  . herniated diks surgery L-5  1996  . herniated disk surgery c 6-7  1986  . INTRAOPERATIVE TRANSESOPHAGEAL ECHOCARDIOGRAM N/A 05/08/2017   Procedure: INTRAOPERATIVE TRANSESOPHAGEAL ECHOCARDIOGRAM;  Surgeon: Delight Ovens, MD;  Location: [facility]  OR;  Service: Open Heart Surgery;  Laterality: N/A;  . REPLACEMENT ASCENDING AORTA N/A 05/08/2017   Procedure: REPLACEMENT ASCENDING AORTA;  Surgeon: Delight Ovens, MD;  Location: [facility] OR;  Service: Open Heart Surgery;  Laterality: N/A;  . THORACIC AORTIC ENDOVASCULAR STENT GRAFT  05/08/2017   Procedure: STENT GRAFTING OF DESCENDING THORACIC AORTA;  Surgeon: Delight Ovens, MD;  Location: [facility] OR;  Service: Open Heart Surgery;;    reports that he quit smoking about 43 years ago. He has never used smokeless tobacco. He reports that he does  not drink alcohol or use drugs. family history includes Alzheimer's disease (age of onset: 32) in his father; Cancer in his brother; Dementia in his father; Hypertension in his unknown relative; Stroke (age of onset: 94) in his mother; Sudden death in his other. Allergies  Allergen Reactions  . 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  Eyes: Negative for visual disturbance.  Respiratory: Negative for cough, chest tightness and shortness of breath.   Cardiovascular: Negative for chest pain, palpitations and leg swelling.  Neurological: Negative for dizziness, syncope, weakness, light-headedness and headaches.       Objective:   Physical Exam  Constitutional: He is oriented to person, place, and time. He appears well-developed and well-nourished.  HENT:  Right Ear: External ear normal.  Left Ear: External ear normal.  Mouth/Throat: Oropharynx is clear and moist.  Eyes: Pupils are equal, round, and reactive to light.  Neck: Neck supple. No thyromegaly present.  Cardiovascular: Normal rate and regular rhythm.  Pulmonary/Chest: Effort normal and breath sounds normal. No respiratory distress. He has no wheezes. He has no rales.  Musculoskeletal: He exhibits no edema.  Neurological: He is alert and oriented to person, place, and time.       Assessment:     #1 history of aortic dissection repair doing well.  He is scheduled CT of chest per [facility]  #2 past history of atrial fibrillation on chronic Coumadin.  Appears to be in sinus rhythm today.  He is getting Coumadin monitored through the Coumadin clinic at [facility]  #3 history of GERD controlled on Protonix.  Continue Protonix for now.  Consider eventual tapering back to H2 blocker  #4 history of mixed anxiety depression stable on Celexa.  Refilled medication for 1 year  #5 dyslipidemia on simvastatin    Plan:     -Check labs with CBC,  comprehensive metabolic panel, lipid panel -Refilled simvastatin, Protonix, and Celexa for 1 year -He will get flu vaccine through the [place] [facility]  Kristian Covey MD [facility] at [facility]

## 2018-05-02 NOTE — Patient Instructions (Signed)
Remember flu vaccine at [facility] this Fall.

## 2018-05-02 NOTE — Addendum Note (Signed)
Addended by: Rene Kocher on: 05/02/2018 10:50 AM   Modules accepted: Orders

## 2018-05-03 ENCOUNTER — Encounter: Payer: Self-pay | Admitting: Family Medicine

## 2018-05-03 ENCOUNTER — Other Ambulatory Visit: Payer: Self-pay

## 2018-05-03 DIAGNOSIS — R7989 Other specified abnormal findings of blood chemistry: Secondary | ICD-10-CM

## 2018-05-04 ENCOUNTER — Ambulatory Visit: Payer: Medicare HMO | Admitting: Cardiothoracic Surgery

## 2018-05-04 ENCOUNTER — Ambulatory Visit
Admission: RE | Admit: 2018-05-04 | Discharge: 2018-05-04 | Disposition: A | Discharge status: Home / Self Care | Payer: Medicare HMO | Source: Ambulatory Visit | Attending: Cardiothoracic Surgery | Admitting: Cardiothoracic Surgery

## 2018-05-04 ENCOUNTER — Other Ambulatory Visit: Payer: Self-pay

## 2018-05-04 ENCOUNTER — Encounter: Payer: Self-pay | Admitting: Cardiothoracic Surgery

## 2018-05-04 VITALS — BP 141/72 | HR 58 | Resp 16 | Ht 68.0 in | Wt 179.0 lb

## 2018-05-04 DIAGNOSIS — I7101 Dissection of thoracic aorta: Secondary | ICD-10-CM

## 2018-05-04 DIAGNOSIS — I71019 Dissection of thoracic aorta, unspecified: Secondary | ICD-10-CM

## 2018-05-04 DIAGNOSIS — Z9889 Other specified postprocedural states: Secondary | ICD-10-CM

## 2018-05-04 MED ORDER — IOPAMIDOL (ISOVUE-370) INJECTION 76%
75.0000 mL | Freq: Once | INTRAVENOUS | Status: AC | PRN
  Administered 2018-05-04: 75 mL via INTRAVENOUS

## 2018-05-04 NOTE — Progress Notes (Signed)
[address]       [place] [address]             [phone]      Herberth W Cada [place] Medical Record #[id number] Date of Birth: 1974-03-24  Referring: Lacretia Leigh, MD Primary Care: Eulas Post, MD Primary Cardiology:Peter Johnsie Cancel, MD  \ Chief Complaint:   POST OP FOLLOW UP 05/08/2017 OPERATIVE REPORT PREOPERATIVE DIAGNOSIS:  Acute type 1 aortic dissection. POSTOPERATIVE DIAGNOSIS:  Acute type 1 aortic dissection. SURGICAL PROCEDURES: 1. Cardiopulmonary bypass with hypothermic circulatory arrest and     antegrade cerebral perfusion. 2. Resuspension, repair of aortic valve, replacement of ascending     aorta, arch with debranching of the cerebral vessels, frozen     elephant trunk with placement of 31 x 31 x 10 cm CTAG Gore stent     graft, placement of a 10 x 39 VBX balloon expandable stent into the     left subclavian artery, a 12 x 8-cm graft off the ascending aorta     to the innominate and left carotid arteries.    History of Present Illness:     77 Patient returns to the office with follow-up CTA of the chest abdomen and pelvis following his acute aortic dissection repair in October 2018.  He continues to do well without obvious sequelae.   Was found to be hit positive postop, is on Coumadin for paroxysmal postop atrial fibrillation and also thrombosis of the left subclavian vein.     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 Arthritis   . Atrial fibrillation ([place]) [I48.91] 05/23/2017  . Blood in the stool   . BRADYCARDIA 03/10/2010   Qualifier: Diagnosis of  By: Elease Hashimoto MD, Bruce    . CHEST PAIN 03/19/2010   Qualifier: Diagnosis of  By: Allean Found, LPN, Christine    .  Chicken pox   . COLONIC POLYPS, HX OF 01/13/2009   Qualifier: Diagnosis of  By: Valma Cava LPN, Izora Gala    . Episode of generalized weakness 02/17/2016  . GERD (gastroesophageal reflux disease) 10/03/2012  . Hay fever   . History of colonic polyps   . History of colonoscopy   . Hyperlipidemia   . MUSCLE STRAIN, HAMSTRING MUSCLE 09/08/2009   Qualifier: Diagnosis of  By: Elease Hashimoto MD, Bruce    . ORTHOSTATIC DIZZINESS 03/05/2010   Qualifier: Diagnosis of  By: Elease Hashimoto MD, Bruce    . Other premature beats 03/17/2010   Qualifier: Diagnosis of  By: Lovena Le, MD, Martyn Malay   . Premature ventricular contraction 01/05/2011  . S/P aortic dissection repair 05/08/2017  . THROMBOCYTOPENIA 03/05/2010   Qualifier: Diagnosis of  By: Elease Hashimoto MD, Bruce    . Weakness 12/09/2015     Social History   Tobacco Use  Smoking Status Former Smoker  . Last attempt to quit: 08/09/1974  . Years since quitting: 43.7  Smokeless Tobacco Never Used    Social History   Substance and Sexual Activity  Alcohol Use No     Allergies  Allergen Reactions  . 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 Quinolones     Patient  was warned about not using Cipro and similar antibiotics. Recent studies have raised concern that fluoroquinolone antibiotics could be associated with an increased risk of aortic aneurysm Fluoroquinolones have non-antimicrobial properties that might jeopardise the integrity of the extracellular matrix of the vascular wall In a  propensity score matched cohort study in [place], there was a 66% increased rate of aortic aneurysm or dissection associated with oral fluoroquinolone use, compared wit    Current Outpatient Medications  Medication Sig Dispense Refill  . aspirin 81 MG tablet Take 1 tablet (81 mg total) by mouth daily. 30 tablet   . citalopram (CELEXA) 20 MG tablet Take 1 tablet (20 mg total) by mouth daily. 90 tablet 3  . metoprolol tartrate (LOPRESSOR) 25 MG  tablet Take 0.5 tablets (12.5 mg total) by mouth 2 (two) times daily. 90 tablet 3  . Misc Natural Products (GLUCOS-CHONDROIT-MSM COMPLEX) TABS Take 1 tablet by mouth 3 (three) times daily.    . Omega-3 Fatty Acids (FISH OIL) 1000 MG CAPS Take 1 capsule by mouth daily.    . pantoprazole (PROTONIX) 40 MG tablet Take 1 tablet (40 mg total) by mouth daily. As needed for heartburn 90 tablet 3  . simvastatin (ZOCOR) 20 MG tablet Take 1 tablet (20 mg total) by mouth at bedtime. 90 tablet 3  . warfarin (COUMADIN) 2.5 MG tablet Take as directed by coumadin clinic 75 tablet 3   No current facility-administered medications for this visit.        Physical Exam: BP (!) 141/72 (BP Location: Right Arm, Patient Position: Sitting, Cuff Size: Large)   Pulse (!) 58   Resp 16   Ht 5\' 8"  (1.727 m)   Wt 179 lb (81.2 kg)   SpO2 95% Comment: RA  BMI 27.22 kg/m  General appearance: alert, cooperative, appears stated age and no distress Head: Normocephalic, without obvious abnormality, atraumatic Neck: no adenopathy, no carotid bruit, no JVD, supple, symmetrical, trachea midline and thyroid not enlarged, symmetric, no tenderness/mass/nodules Lymph nodes: Cervical, supraclavicular, and axillary nodes normal. Resp: clear to auscultation bilaterally Cardio: regular rate and rhythm, S1, S2 normal, no murmur, click, rub or gallop GI: soft, non-tender; bowel sounds normal; no masses,  no organomegaly Extremities: extremities normal, atraumatic, no cyanosis or edema and Homans sign is negative, no sign of DVT Neurologic: Grossly normal    Diagnostic Studies & Laboratory data:     Recent Radiology Findings:  Ct Angio Chest Aorta W/cm &/or Wo/cm  Result Date: 05/04/2018 CLINICAL DATA:  77 year old with history of thoracic aortic dissection. Ascending aortic replacement of 05/08/2017. EXAM: CT ANGIOGRAPHY CHEST, ABDOMEN AND PELVIS TECHNIQUE: Multidetector CT imaging through the chest, abdomen and pelvis was  performed using the standard protocol during bolus administration of intravenous contrast. Multiplanar reconstructed images and MIPs were obtained and reviewed to evaluate the vascular anatomy. CONTRAST:  53mL ISOVUE-370 IOPAMIDOL (ISOVUE-370) INJECTION 76% COMPARISON:  Chest CT 04/06/2018 FINDINGS: CTA CHEST FINDINGS Cardiovascular: Stable postsurgical changes compatible with replacement of the ascending thoracic aorta, placement of aortic arch stent, replacement of the origin of the right innominate artery and left common carotid artery and stent placement in the left subclavian artery. The ascending aortic root replacement is widely patent. The graft supplying the left common carotid artery and right innominate artery are patent. Previously, there was a dissection involving the left common carotid artery but this has resolved. Bilateral common carotid arteries are patent. Bilateral vertebral arteries are patent. Bilateral subclavian arteries are patent including the stented left subclavian artery. The  stent graft extends into the proximal descending thoracic aorta. Again noted is a dissection involving the descending thoracic aorta just distal to the stent graft. True lumen is probably along the ventral aspect. Configuration and size of the descending thoracic aorta and dissection are unchanged. No evidence for aortic aneurysm. Pulmonary arteries are not well opacified on this examination. There is flow in the coronary arteries but poorly characterized on this examination. Heart size is normal without significant pericardial fluid. Mediastinum/Nodes: No chest lymphadenopathy. Thyroid tissue is unremarkable. No axillary lymphadenopathy. Lungs/Pleura: Trachea and mainstem bronchi are patent. Stable 4 mm pleural-based nodule along the right upper lung on sequence 21, image 79 is unchanged since 2018 and likely benign. No large areas of consolidation or airspace disease. No pleural effusions. Musculoskeletal: No  acute bone abnormality. Review of the MIP images confirms the above findings. CTA ABDOMEN AND PELVIS FINDINGS VASCULAR Aorta: Aortic dissection extends into the abdominal aorta. Perforations within the intimal flap throughout the abdominal aorta and the aortic bifurcation. Dissection terminates at the aortic bifurcation. The distal abdominal aorta measures up to 2.6 cm which has minimally changed. The ventral lumen appears to represent the true lumen. Celiac: Origin of the celiac trunk is not well demonstrated and suspect there is underlying median arcuate ligament compression. Again, there may be a small dissection flap at the origin of the celiac trunk. The main branches of the celiac trunk are patent. SMA: SMA originates from the true lumen and the SMA is widely patent. Renals: Right renal artery originates from the true lumen and widely patent. Left renal artery appears to originate from the more posterior false lumen but main left renal artery is widely patent. There is an accessory inferior left renal artery originating from the ventral true lumen. IMA: IMA originates from the true lumen and patent. Inflow: Dissection does not extend into the iliac arteries. Stable ectasia of the right common iliac artery measuring 1.9 cm and left common iliac artery measures 1.7 cm. Internal and external iliac arteries are patent bilaterally. Proximal femoral arteries are patent bilaterally. Veins: No obvious venous abnormality within the limitations of this arterial phase study. Review of the MIP images confirms the above findings. NON-VASCULAR Hepatobiliary: No acute abnormality to the liver or gallbladder. No significant biliary dilatation. Pancreas: Unremarkable. No pancreatic ductal dilatation or surrounding inflammatory changes. Spleen: Normal in size without focal abnormality. Adrenals/Urinary Tract: Normal adrenal glands. Scarring along the posterior aspect of the right kidney. No hydronephrosis. Urinary bladder is  unremarkable. Stomach/Bowel: Normal appearance of the stomach and duodenum. Normal appearance of small and large bowel. No evidence for bowel obstruction or inflammation. Appendix is not confidently identified. Lymphatic: No enlarged lymph nodes in the abdomen or pelvis. Reproductive: Prostate is unremarkable. Other: No free fluid. Left inguinal hernia containing fat. Negative for free air. Musculoskeletal: Stable disc space narrowing and sclerosis at L5-S1. Stable compression deformity involving the L1 vertebral body. No acute bone abnormality. Review of the MIP images confirms the above findings. IMPRESSION: 1. Stable postsurgical changes associated with treated Type A aortic dissection. The replaced ascending thoracic aorta, aortic stent graft and left subclavian stent are patent. The great vessels are patent. The dissection involving the left common carotid artery has resolved. 2. Stable appearance of the residual dissection involving the descending thoracic aorta and abdominal aorta. There is may be a small dissection involving the celiac trunk which has not significantly changed. 3. Main visceral arteries are widely patent. 4. No acute abnormality in the chest, abdomen or  pelvis. Electronically Signed   By: Markus Daft M.D.   On: 05/04/2018 16:09    Dg Chest 2 View  Result Date: 07/14/2017 CLINICAL DATA:  Aortic dissection repair. EXAM: CHEST  2 VIEW COMPARISON:  CT 08/29/2016 FINDINGS: Median sternotomy. Thoracic aortic stent graft noted in stable the. Heart size normal. No focal infiltrate. No pleural effusion or pneumothorax. Surgical clips noted over the right upper chest. Improved left base subsegmental atelectasis with mild residual. Resolution of left pleural effusion. No pneumothorax . IMPRESSION: 1. Prior median sternotomy. Aortic stent graft. Aortic stent graft appears stable. Heart size stable. 2. Improved left base subsegmental atelectasis. Interim resolution of left pleural effusion.  Electronically Signed   By: Marcello Moores  Register   On: 07/14/2017 14:14     Ct Coronary Morph W/cta Cor Nancy Fetter W/ca W/cm &/or Wo/cm  Addendum Date: 06/29/2017   ADDENDUM REPORT: 06/29/2017 14:03 CLINICAL DATA:  Post Type A Dissection EXAM: Cardiac CTA MEDICATIONS: Sub lingual nitro. 4mg  and lopressor 5mg  TECHNIQUE: The patient was scanned on a Siemens 443 slice scanner. Gantry rotation speed was 250 msecs. Collimation was . 6 mm . A 100 kV prospective scan was supposed to be triggered in the ascending aorta but appears to have started early with ROI in SVC. The 3D data set was interpreted on a dedicated work station using MPR, MIP and VRT modes. A total of 80cc of contrast was used. FINDINGS: Non-cardiac: See separate report from [facility]. No significant findings on limited lung and soft tissue windows. Coronary Arteries: Right dominant with no anomalies LM:  Less than 30% calcified disease in distal vessel LAD:  Less than 50% calcified disease in proximal and mid vessel D1:  Less than 30% calcified disease in ostial vessel D2:  Less than 30% calcified disease in ostial vessel Circumflex: Appears anomalous from RCA Courses behind aorta with no significant stenosis OM1:  Normal RCA:  Less than 30% calcified disease in proximal and mid RCA PDA:  Normal PLA:  Normal IMPRESSION: 1) No hemodynamically significant CAD. 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   On: 06/29/2017 14:03   Result Date: 06/29/2017 EXAM: OVER-READ INTERPRETATION  CT CHEST The following report is an over-read performed by radiologist Dr. Collene Leyden [facility] [place] on 06/29/2017. This over-read does not include interpretation of cardiac or coronary anatomy or pathology. The coronary CTA interpretation by the cardiologist is attached.  COMPARISON:  CT 05/08/2017 FINDINGS: Vascular: Previously seen aortic dissection again noted in the descending thoracic aorta below the stent graft which extends from the distal aortic arch into the mid descending thoracic aorta prior tube graft repair of the ascending thoracic aorta without dissection. Dissection flap again noted within the left common carotid artery, no longer visualized in the brachiocephalic artery. Stents noted within the left subclavian artery. There is mild cardiomegaly. Mediastinum/Nodes: No mediastinal, hilar, or axillary adenopathy. Trachea and esophagus are unremarkable. Lungs/Pleura: Moderate left pleural effusion. Minimal compressive atelectasis in the lower lobe. No confluent airspace opacity otherwise. Upper Abdomen: Imaging into the upper abdomen shows no acute findings. Musculoskeletal: Chest wall soft tissues are unremarkable. No acute bony abnormality. IMPRESSION: Evidence of prior repair of the type day aortic dissection with tube graft repair in the ascending thoracic aorta and stent graft repair in the distal arch and descending thoracic aorta. The dissection flap again  noted, extending from the distal aspect of the stent graft into the upper abdomen. Prior repair of dissection within the brachycephalic artery and stenting of the left subclavian artery. Dissection flap continues to be noted in the left carotid artery. Moderate left pleural effusion with left lower lobe atelectasis. Electronically Signed: By: Rolm Baptise M.D. On: 06/29/2017 10:22     Recent Lab Findings: Lab Results  Component Value Date   WBC 6.3 05/02/2018   HGB 14.9 05/02/2018   HCT 43.7 05/02/2018   PLT 145.0 (L) 05/02/2018   GLUCOSE 100 (H) 05/02/2018   CHOL 140 05/02/2018   TRIG 87.0 05/02/2018   HDL 49.30 05/02/2018   LDLCALC 73 05/02/2018   ALT 13 05/02/2018   AST 22 05/02/2018   NA 139 05/02/2018   K 4.4 05/02/2018   CL 103 05/02/2018   CREATININE 1.20 05/02/2018   BUN 18  05/02/2018   CO2 28 05/02/2018   TSH 5.980 (H) 06/01/2017   INR 2.8 04/20/2018   HGBA1C 6.1 (H) 12/09/2015    Transthoracic Echocardiography  Patient:    Jese, Comella MR #:       [id number] Study Date: 04/11/2018 Gender:     M Age:        38 Height:     180.3 cm Weight:     104.8 kg BSA:        2.32 m^2 Pt. Status: Room:   ORDERING     Lanelle Bal MD  [place] MD  [place], [place], [place] Croitor[place], MD  PERFORMING   [facility], Outpatient  cc:  ------------------------------------------------------------------- LV EF: 55% -   60%  ------------------------------------------------------------------- Indications:      S/p aortic dissection repair. (Z98.89).  ------------------------------------------------------------------- History:   PMH:   Atrial fibrillation.  Risk factors:  PVC. Bradycardia. Dyslipidemia.  ------------------------------------------------------------------- Study Conclusions  - Left ventricle: The cavity size was normal. Wall thickness was   normal. Systolic function was normal. The estimated ejection   fraction was in the range of 55% to 60%. Wall motion was normal;   there were no regional wall motion abnormalities. - Aortic valve: There was mild regurgitation. - Aorta: Post dissection with mild sinus dilatation normal root   size. - Left atrium: The atrium was mildly dilated. - Atrial septum: No defect or patent foramen ovale was identified.  ------------------------------------------------------------------- Labs, prior tests, procedures, and surgery: Transthoracic echocardiography (06/07/2017).     EF was 55%.  Aortic repair (October 2018).    Aortic root repair.  ------------------------------------------------------------------- Study data:  Comparison was made to the study of 06/07/2017.  Study status:  Routine.  Procedure:  The patient  reported no pain pre or post test. Transthoracic echocardiography. Image quality was adequate.  Study completion:  There were no complications. Transthoracic echocardiography.  M-mode, complete 2D, 3D, spectral Doppler, and color Doppler.  Birthdate:  Patient birthdate: [birth year].  Age:  Patient is 77 yr old.  Sex:  Gender: male. BMI: 32.2 kg/m^2.  Blood pressure:     137/69  Patient status: Outpatient.  Study date:  Study date: 04/11/2018. Study time: 07:31 AM.  Location:  [facility]  -------------------------------------------------------------------  ------------------------------------------------------------------- Left ventricle:  The cavity size was normal. Wall thickness was normal. Systolic function was normal. The estimated ejection fraction was in the range of 55% to 60%. Wall motion was normal; there were no regional wall motion  abnormalities.  ------------------------------------------------------------------- Aortic valve:   Trileaflet; normal thickness leaflets. Mobility was not restricted.  Doppler:  Transvalvular velocity was within the normal range. There was no stenosis. There was mild regurgitation.   ------------------------------------------------------------------- Aorta:  Post dissection with mild sinus dilatation normal root size. Aortic root: The aortic root was normal in size.  ------------------------------------------------------------------- Mitral valve:   Structurally normal valve.   Leaflet separation was normal. Mobility was not restricted.  Doppler:  Transvalvular velocity was within the normal range. There was no evidence for stenosis. There was no regurgitation.    Valve area by pressure half-time: 2.53 cm^2. Indexed valve area by pressure half-time: 1.09 cm^2/m^2.  ------------------------------------------------------------------- Left atrium:  The atrium was mildly  dilated.  ------------------------------------------------------------------- Atrial septum:  No defect or patent foramen ovale was identified.   ------------------------------------------------------------------- Right ventricle:  The cavity size was normal. Wall thickness was normal. Systolic function was normal.  ------------------------------------------------------------------- Pulmonic valve:    Doppler:  Transvalvular velocity was within the normal range. There was no evidence for stenosis. There was mild regurgitation.  ------------------------------------------------------------------- Tricuspid valve:   Structurally normal valve.    Doppler: Transvalvular velocity was within the normal range. There was mild regurgitation.  ------------------------------------------------------------------- Pulmonary artery:   The main pulmonary artery was normal-sized. Systolic pressure was within the normal range.  ------------------------------------------------------------------- Right atrium:  The atrium was normal in size.  ------------------------------------------------------------------- Pericardium:  The pericardium was normal in appearance. There was no pericardial effusion.  ------------------------------------------------------------------- Systemic veins: Inferior vena cava: The vessel was normal in size. The respirophasic diameter changes were in the normal range (>= 50%), consistent with normal central venous pressure.  ------------------------------------------------------------------- Post procedure conclusions Ascending Aorta:  - Post dissection with mild sinus dilatation normal root size.  ------------------------------------------------------------------- Measurements   Left ventricle                           Value          Reference  LV ID, ED, PLAX chordal                  49    mm       43 - 52  LV ID, ES, PLAX chordal                  29     mm       23 - 38  LV fx shortening, PLAX chordal           41    %        >=29  LV PW thickness, ED                      10    mm       ---------  IVS/LV PW ratio, ED                      0.8            <=1.3  Stroke volume, 2D                        81    ml       ---------  Stroke volume/bsa, 2D                    35    ml/m^2   ---------  LV e&', lateral  10.7  cm/s     ---------  LV E/e&', lateral                         3.55           ---------  LV e&', medial                            5.5   cm/s     ---------  LV E/e&', medial                          6.91           ---------  LV e&', average                           8.1   cm/s     ---------  LV E/e&', average                         4.69           ---------    Ventricular septum                       Value          Reference  IVS thickness, ED                        8     mm       ---------    LVOT                                     Value          Reference  LVOT ID, S                               20    mm       ---------  LVOT area                                3.14  cm^2     ---------  LVOT peak velocity, S                    108   cm/s     ---------  LVOT mean velocity, S                    65    cm/s     ---------  LVOT VTI, S                              25.7  cm       ---------    Aortic valve                             Value          Reference  Aortic regurg pressure half-time         605   ms       ---------  Aorta                                    Value          Reference  Aortic root ID, ED                       41    mm       ---------    Left atrium                              Value          Reference  LA ID, A-P, ES                           44    mm       ---------  LA ID/bsa, A-P                           1.9   cm/m^2   <=2.2  LA volume, S                             46.1  ml       ---------  LA volume/bsa, S                         19.9  ml/m^2   ---------  LA volume, ES, 1-p  A4C                   38.7  ml       ---------  LA volume/bsa, ES, 1-p A4C               16.7  ml/m^2   ---------  LA volume, ES, 1-p A2C                   55.5  ml       ---------  LA volume/bsa, ES, 1-p A2C               23.9  ml/m^2   ---------    Mitral valve                             Value          Reference  Mitral E-wave peak velocity              38    cm/s     ---------  Mitral A-wave peak velocity              40.1  cm/s     ---------  Mitral deceleration time         (H)     296   ms       150 - 230  Mitral pressure half-time                87    ms       ---------  Mitral E/A ratio, peak                   0.9            ---------  Mitral valve area, PHT, DP               2.53  cm^2     ---------  Mitral valve area/bsa, PHT, DP           1.09  cm^2/m^2 ---------    Pulmonary arteries                       Value          Reference  PA pressure, S, DP                       29    mm Hg    <=30    Tricuspid valve                          Value          Reference  Tricuspid regurg peak velocity           228   cm/s     ---------  Tricuspid peak RV-RA gradient            21    mm Hg    ---------    Systemic veins                           Value          Reference  Estimated CVP                            8     mm Hg    ---------    Right ventricle                          Value          Reference  TAPSE                                    17    mm       ---------  RV pressure, S, DP                       29    mm Hg    <=30  RV s&', lateral, S                        7.01  cm/s     ---------  Legend: (L)  and  (H)  mark values outside specified reference range.  ------------------------------------------------------------------- Prepared and Electronically Authenticated by  Jenkins Rouge, M.D. 2019-09-03T08:15:41                        Assessment / Plan:   Stable appearance of CTA of chest abdomen pelvis following type I aortic dissection October 2018  Patient remains  stable with good blood pressure control since his acute aortic dissection repair in October 2018.  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

## 2018-05-09 ENCOUNTER — Encounter: Payer: Self-pay | Admitting: Family Medicine

## 2018-05-23 DIAGNOSIS — H18413 Arcus senilis, bilateral: Secondary | ICD-10-CM | POA: Diagnosis not present

## 2018-05-23 DIAGNOSIS — Z961 Presence of intraocular lens: Secondary | ICD-10-CM | POA: Diagnosis not present

## 2018-05-23 DIAGNOSIS — H26492 Other secondary cataract, left eye: Secondary | ICD-10-CM | POA: Diagnosis not present

## 2018-05-23 DIAGNOSIS — H02831 Dermatochalasis of right upper eyelid: Secondary | ICD-10-CM | POA: Diagnosis not present

## 2018-05-24 ENCOUNTER — Ambulatory Visit (INDEPENDENT_AMBULATORY_CARE_PROVIDER_SITE_OTHER): Payer: Medicare HMO | Admitting: General Practice

## 2018-05-24 DIAGNOSIS — Z9889 Other specified postprocedural states: Secondary | ICD-10-CM

## 2018-05-24 DIAGNOSIS — Z7901 Long term (current) use of anticoagulants: Secondary | ICD-10-CM

## 2018-05-24 DIAGNOSIS — I4891 Unspecified atrial fibrillation: Secondary | ICD-10-CM

## 2018-05-24 LAB — POCT INR: INR: 2.3 (ref 2.0–3.0)

## 2018-05-24 NOTE — Patient Instructions (Addendum)
Pre visit review using our clinic review tool, if applicable. No additional management support is needed unless otherwise documented below in the visit note.  Continue taking 5mg  daily except 7.5mg  on Tuesdays and Saturdays. Recheck in 4 weeks.

## 2018-05-25 ENCOUNTER — Other Ambulatory Visit: Payer: Self-pay | Admitting: Nurse Practitioner

## 2018-05-25 ENCOUNTER — Other Ambulatory Visit: Payer: Self-pay | Admitting: Cardiovascular Disease

## 2018-05-31 ENCOUNTER — Encounter: Payer: Self-pay | Admitting: Gastroenterology

## 2018-05-31 ENCOUNTER — Ambulatory Visit: Payer: Medicare HMO | Admitting: Gastroenterology

## 2018-05-31 VITALS — BP 122/74 | HR 80 | Ht 68.0 in | Wt 182.0 lb

## 2018-05-31 DIAGNOSIS — K602 Anal fissure, unspecified: Secondary | ICD-10-CM

## 2018-05-31 DIAGNOSIS — Z7901 Long term (current) use of anticoagulants: Secondary | ICD-10-CM | POA: Diagnosis not present

## 2018-05-31 DIAGNOSIS — K648 Other hemorrhoids: Secondary | ICD-10-CM | POA: Diagnosis not present

## 2018-05-31 MED ORDER — METHYLCELLULOSE (LAXATIVE) PO POWD: 1.0000 | Freq: Every day | ORAL | Status: DC

## 2018-05-31 MED ORDER — AMBULATORY NON FORMULARY MEDICATION: 0 refills | Status: DC

## 2018-05-31 NOTE — Patient Instructions (Signed)
If you are age 76 or older, your body mass index should be between 23-30. Your Body mass index is 27.67 kg/m. If this is out of the aforementioned range listed, please consider follow up with your Primary Care Provider.  If you are age 28 or younger, your body mass index should be between 19-25. Your Body mass index is 27.67 kg/m. If this is out of the aformentioned range listed, please consider follow up with your Primary Care Provider.   We have sent a prescription for nitroglycerin 0.125% gel to [facility]. You should apply a pea size amount to your rectum three times daily x 6-8 weeks.  [facility]'s information is below: Address: [address], [place], [place] [address]  [phone]  *Please DO NOT go directly from our office to pick up this medication! Give the pharmacy 1 day to process the prescription as this is compounded at takes time to make.  Please use a daily fiber supplement such as Citrucel.  You can purchase over the counter 1% hydrocortisone cream and use as needed for hemorrhoids.  If your hemorrhoids become more bothersome please call to schedule a follow up appointment.  Thank you for entrusting me with your care and for choosing [facility], Dr. [place] Cellar

## 2018-05-31 NOTE — Progress Notes (Signed)
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   The patient reports long-standing history of hemorrhoids which bothered him intermittently over years. He has intermittent irritation in his anal canal and bleeding associated with this. He reports when he was admitted for his aortic dissection in 2018, he had severe worsening of his hemorrhoids with pain and bleeding. Since that time he's had intermittent symptoms which are periodic and not frequent. Most of the time he does not have any significant symptoms. He thinks his last colonoscopy was performed at the [place] in 2018 or 2017, he reports that it was normal and he did not need any follow-up exams.  He continues to take Coumadin for his lower extremity aneurysm. He denies any constipation or diarrhea, however does endorse some straining at times. He does read while on the toilet. He otherwise denies any abdominal pains. No weight loss. No FH of CRC.  Echocardiogram 04/11/2018 - EF 55-60%  Colonoscopy 2017-2018 - at [facility], no records available   Past Medical History:  Diagnosis Date  . 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 Arthritis   . Atrial fibrillation ([place]) [I48.91] 05/23/2017  . Blood in the stool   . BRADYCARDIA 03/10/2010   Qualifier: Diagnosis of  By: Elease Hashimoto MD, Bruce    . CHEST PAIN 03/19/2010   Qualifier: Diagnosis of  By: Allean Found, LPN, Christine    . Chicken pox   . COLONIC POLYPS, HX OF 01/13/2009   Qualifier: Diagnosis of  By: Valma Cava LPN, Izora Gala    . Episode of generalized weakness 02/17/2016  . GERD (gastroesophageal reflux disease) 10/03/2012  . Hay fever   . History of colonic polyps   . History of colonoscopy   . Hyperlipidemia   . MUSCLE STRAIN, HAMSTRING MUSCLE  09/08/2009   Qualifier: Diagnosis of  By: Elease Hashimoto MD, Bruce    . ORTHOSTATIC DIZZINESS 03/05/2010   Qualifier: Diagnosis of  By: Elease Hashimoto MD, Bruce    . Other premature beats 03/17/2010   Qualifier: Diagnosis of  By: Lovena Le, MD, Martyn Malay   . Premature ventricular contraction 01/05/2011  . S/P aortic dissection repair 05/08/2017  . THROMBOCYTOPENIA 03/05/2010   Qualifier: Diagnosis of  By: Elease Hashimoto MD, Bruce    . Weakness 12/09/2015     Past Surgical History:  Procedure Laterality Date  . ABDOMINAL ADHESION SURGERY  2007  . AORTIC INTERVENTION N/A 05/08/2017   Procedure: HYPOTHERMIC CIRCULATORY ARREST;  Surgeon: [name], MD;  Location: [place];  Service: Open Heart Surgery;  Laterality: N/A;  . ASCENDING AORTIC ROOT REPLACEMENT N/A 05/08/2017   Procedure: REPLACEMENT OF ASCENDING AORTIC ARCH;  Surgeon: [name], MD;  Location: [place];  Service: Open Heart Surgery;  Laterality: N/A;  . CANNULATION FOR CARDIOPULMONARY BYPASS Right 05/08/2017   Procedure: AXILLARY CANNULATION;  Surgeon: [name], MD;  Location: [place];  Service: Open Heart Surgery;  Laterality: Right;  . ENDOVASCULAR REPAIR/STENT GRAFT  05/08/2017   Procedure: STENT OF LEFT SUBCLAVIAN ARTERY with 10x39VBX;  Surgeon: [name], MD;  Location: [place];  Service: Open Heart Surgery;;  . herniated diks surgery L-5  1996  . herniated disk surgery c 6-7  1986  . INTRAOPERATIVE TRANSESOPHAGEAL ECHOCARDIOGRAM N/A 05/08/2017  Procedure: INTRAOPERATIVE TRANSESOPHAGEAL ECHOCARDIOGRAM;  Surgeon: [name], MD;  Location: [place];  Service: Open Heart Surgery;  Laterality: N/A;  . REPLACEMENT ASCENDING AORTA N/A 05/08/2017   Procedure: REPLACEMENT ASCENDING AORTA;  Surgeon: [name], MD;  Location: [place];  Service: Open Heart Surgery;  Laterality: N/A;  . THORACIC AORTIC ENDOVASCULAR STENT GRAFT  05/08/2017   Procedure: STENT GRAFTING OF DESCENDING THORACIC AORTA;  Surgeon: [name], MD;  Location: [facility] OR;  Service: Open Heart Surgery;;   Family History  Problem Relation Age of Onset  . Alzheimer's disease Father 44  . Dementia Father   . Stroke Mother 36  . Cancer Brother        Leukemia  . Sudden death Other        family  . Hypertension Unknown   . Heart disease Neg Hx    Social History   Tobacco Use  . Smoking status: Former Smoker    Last attempt to quit: 08/09/1974    Years since quitting: 43.8  . Smokeless tobacco: Never Used  Substance Use Topics  . Alcohol use: No  . Drug use: No   Current Outpatient Medications  Medication Sig Dispense Refill  . aspirin 81 MG tablet Take 1 tablet (81 mg total) by mouth daily. 30 tablet   . citalopram (CELEXA) 20 MG tablet Take 1 tablet (20 mg total) by mouth daily. 90 tablet 3  . metoprolol tartrate (LOPRESSOR) 25 MG tablet TAKE 1/2 (ONE-HALF) TABLET BY MOUTH TWICE DAILY 90 tablet 3  . Misc Natural Products (GLUCOS-CHONDROIT-MSM COMPLEX) TABS Take 1 tablet by mouth 3 (three) times daily.    . Omega-3 Fatty Acids (FISH OIL) 1000 MG CAPS Take 1 capsule by mouth daily.    . pantoprazole (PROTONIX) 40 MG tablet Take 1 tablet (40 mg total) by mouth daily. As needed for heartburn 90 tablet 3  . simvastatin (ZOCOR) 20 MG tablet Take 1 tablet (20 mg total) by mouth at bedtime. 90 tablet 3  . warfarin (COUMADIN) 2.5 MG tablet TAKE AS DIRECTED BY MOUTH BY COUMADIN CLINIC 75 tablet 3   No current facility-administered medications for this visit.    Allergies  Allergen Reactions  . 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 Quinolones     Patient was warned about not using Cipro and similar antibiotics. Recent studies have raised concern that fluoroquinolone antibiotics could be associated with an increased risk of aortic aneurysm Fluoroquinolones have non-antimicrobial properties that might jeopardise the integrity of the extracellular matrix of the vascular wall In a  propensity  score matched cohort study in [place], there was a 66% increased rate of aortic aneurysm or dissection associated with oral fluoroquinolone use, compared wit     Review of Systems: All systems reviewed and negative except where noted in HPI.    Ct Angio Chest Aorta W/cm &/or Wo/cm  Result Date: 05/04/2018 CLINICAL DATA:  77 year old with history of thoracic aortic dissection. Ascending aortic replacement of 05/08/2017. EXAM: CT ANGIOGRAPHY CHEST, ABDOMEN AND PELVIS TECHNIQUE: Multidetector CT imaging through the chest, abdomen and pelvis was performed using the standard protocol during bolus administration of intravenous contrast. Multiplanar reconstructed images and MIPs were obtained and reviewed to evaluate the vascular anatomy. CONTRAST:  64mL ISOVUE-370 IOPAMIDOL (ISOVUE-370) INJECTION 76% COMPARISON:  Chest CT 04/06/2018 FINDINGS: CTA CHEST FINDINGS Cardiovascular: Stable postsurgical changes compatible with replacement of the ascending thoracic aorta, placement of aortic arch stent, replacement of the  origin of the right innominate artery and left common carotid artery and stent placement in the left subclavian artery. The ascending aortic root replacement is widely patent. The graft supplying the left common carotid artery and right innominate artery are patent. Previously, there was a dissection involving the left common carotid artery but this has resolved. Bilateral common carotid arteries are patent. Bilateral vertebral arteries are patent. Bilateral subclavian arteries are patent including the stented left subclavian artery. The stent graft extends into the proximal descending thoracic aorta. Again noted is a dissection involving the descending thoracic aorta just distal to the stent graft. True lumen is probably along the ventral aspect. Configuration and size of the descending thoracic aorta and dissection are unchanged. No evidence for aortic aneurysm. Pulmonary arteries are not well  opacified on this examination. There is flow in the coronary arteries but poorly characterized on this examination. Heart size is normal without significant pericardial fluid. Mediastinum/Nodes: No chest lymphadenopathy. Thyroid tissue is unremarkable. No axillary lymphadenopathy. Lungs/Pleura: Trachea and mainstem bronchi are patent. Stable 4 mm pleural-based nodule along the right upper lung on sequence 21, image 79 is unchanged since 2018 and likely benign. No large areas of consolidation or airspace disease. No pleural effusions. Musculoskeletal: No acute bone abnormality. Review of the MIP images confirms the above findings. CTA ABDOMEN AND PELVIS FINDINGS VASCULAR Aorta: Aortic dissection extends into the abdominal aorta. Perforations within the intimal flap throughout the abdominal aorta and the aortic bifurcation. Dissection terminates at the aortic bifurcation. The distal abdominal aorta measures up to 2.6 cm which has minimally changed. The ventral lumen appears to represent the true lumen. Celiac: Origin of the celiac trunk is not well demonstrated and suspect there is underlying median arcuate ligament compression. Again, there may be a small dissection flap at the origin of the celiac trunk. The main branches of the celiac trunk are patent. SMA: SMA originates from the true lumen and the SMA is widely patent. Renals: Right renal artery originates from the true lumen and widely patent. Left renal artery appears to originate from the more posterior false lumen but main left renal artery is widely patent. There is an accessory inferior left renal artery originating from the ventral true lumen. IMA: IMA originates from the true lumen and patent. Inflow: Dissection does not extend into the iliac arteries. Stable ectasia of the right common iliac artery measuring 1.9 cm and left common iliac artery measures 1.7 cm. Internal and external iliac arteries are patent bilaterally. Proximal femoral arteries are  patent bilaterally. Veins: No obvious venous abnormality within the limitations of this arterial phase study. Review of the MIP images confirms the above findings. NON-VASCULAR Hepatobiliary: No acute abnormality to the liver or gallbladder. No significant biliary dilatation. Pancreas: Unremarkable. No pancreatic ductal dilatation or surrounding inflammatory changes. Spleen: Normal in size without focal abnormality. Adrenals/Urinary Tract: Normal adrenal glands. Scarring along the posterior aspect of the right kidney. No hydronephrosis. Urinary bladder is unremarkable. Stomach/Bowel: Normal appearance of the stomach and duodenum. Normal appearance of small and large bowel. No evidence for bowel obstruction or inflammation. Appendix is not confidently identified. Lymphatic: No enlarged lymph nodes in the abdomen or pelvis. Reproductive: Prostate is unremarkable. Other: No free fluid. Left inguinal hernia containing fat. Negative for free air. Musculoskeletal: Stable disc space narrowing and sclerosis at L5-S1. Stable compression deformity involving the L1 vertebral body. No acute bone abnormality. Review of the MIP images confirms the above findings. IMPRESSION: 1. Stable postsurgical changes associated with treated Type  A aortic dissection. The replaced ascending thoracic aorta, aortic stent graft and left subclavian stent are patent. The great vessels are patent. The dissection involving the left common carotid artery has resolved. 2. Stable appearance of the residual dissection involving the descending thoracic aorta and abdominal aorta. There is may be a small dissection involving the celiac trunk which has not significantly changed. 3. Main visceral arteries are widely patent. 4. No acute abnormality in the chest, abdomen or pelvis. Electronically Signed   By: Markus Daft M.D.   On: 05/04/2018 16:09   Ct Angio Abd/pel W/ And/or W/o  Result Date: 05/04/2018 CLINICAL DATA:  77 year old with history of thoracic  aortic dissection. Ascending aortic replacement of 05/08/2017. EXAM: CT ANGIOGRAPHY CHEST, ABDOMEN AND PELVIS TECHNIQUE: Multidetector CT imaging through the chest, abdomen and pelvis was performed using the standard protocol during bolus administration of intravenous contrast. Multiplanar reconstructed images and MIPs were obtained and reviewed to evaluate the vascular anatomy. CONTRAST:  71mL ISOVUE-370 IOPAMIDOL (ISOVUE-370) INJECTION 76% COMPARISON:  Chest CT 04/06/2018 FINDINGS: CTA CHEST FINDINGS Cardiovascular: Stable postsurgical changes compatible with replacement of the ascending thoracic aorta, placement of aortic arch stent, replacement of the origin of the right innominate artery and left common carotid artery and stent placement in the left subclavian artery. The ascending aortic root replacement is widely patent. The graft supplying the left common carotid artery and right innominate artery are patent. Previously, there was a dissection involving the left common carotid artery but this has resolved. Bilateral common carotid arteries are patent. Bilateral vertebral arteries are patent. Bilateral subclavian arteries are patent including the stented left subclavian artery. The stent graft extends into the proximal descending thoracic aorta. Again noted is a dissection involving the descending thoracic aorta just distal to the stent graft. True lumen is probably along the ventral aspect. Configuration and size of the descending thoracic aorta and dissection are unchanged. No evidence for aortic aneurysm. Pulmonary arteries are not well opacified on this examination. There is flow in the coronary arteries but poorly characterized on this examination. Heart size is normal without significant pericardial fluid. Mediastinum/Nodes: No chest lymphadenopathy. Thyroid tissue is unremarkable. No axillary lymphadenopathy. Lungs/Pleura: Trachea and mainstem bronchi are patent. Stable 4 mm pleural-based nodule along  the right upper lung on sequence 21, image 79 is unchanged since 2018 and likely benign. No large areas of consolidation or airspace disease. No pleural effusions. Musculoskeletal: No acute bone abnormality. Review of the MIP images confirms the above findings. CTA ABDOMEN AND PELVIS FINDINGS VASCULAR Aorta: Aortic dissection extends into the abdominal aorta. Perforations within the intimal flap throughout the abdominal aorta and the aortic bifurcation. Dissection terminates at the aortic bifurcation. The distal abdominal aorta measures up to 2.6 cm which has minimally changed. The ventral lumen appears to represent the true lumen. Celiac: Origin of the celiac trunk is not well demonstrated and suspect there is underlying median arcuate ligament compression. Again, there may be a small dissection flap at the origin of the celiac trunk. The main branches of the celiac trunk are patent. SMA: SMA originates from the true lumen and the SMA is widely patent. Renals: Right renal artery originates from the true lumen and widely patent. Left renal artery appears to originate from the more posterior false lumen but main left renal artery is widely patent. There is an accessory inferior left renal artery originating from the ventral true lumen. IMA: IMA originates from the true lumen and patent. Inflow: Dissection does not extend into the  iliac arteries. Stable ectasia of the right common iliac artery measuring 1.9 cm and left common iliac artery measures 1.7 cm. Internal and external iliac arteries are patent bilaterally. Proximal femoral arteries are patent bilaterally. Veins: No obvious venous abnormality within the limitations of this arterial phase study. Review of the MIP images confirms the above findings. NON-VASCULAR Hepatobiliary: No acute abnormality to the liver or gallbladder. No significant biliary dilatation. Pancreas: Unremarkable. No pancreatic ductal dilatation or surrounding inflammatory changes. Spleen:  Normal in size without focal abnormality. Adrenals/Urinary Tract: Normal adrenal glands. Scarring along the posterior aspect of the right kidney. No hydronephrosis. Urinary bladder is unremarkable. Stomach/Bowel: Normal appearance of the stomach and duodenum. Normal appearance of small and large bowel. No evidence for bowel obstruction or inflammation. Appendix is not confidently identified. Lymphatic: No enlarged lymph nodes in the abdomen or pelvis. Reproductive: Prostate is unremarkable. Other: No free fluid. Left inguinal hernia containing fat. Negative for free air. Musculoskeletal: Stable disc space narrowing and sclerosis at L5-S1. Stable compression deformity involving the L1 vertebral body. No acute bone abnormality. Review of the MIP images confirms the above findings. IMPRESSION: 1. Stable postsurgical changes associated with treated Type A aortic dissection. The replaced ascending thoracic aorta, aortic stent graft and left subclavian stent are patent. The great vessels are patent. The dissection involving the left common carotid artery has resolved. 2. Stable appearance of the residual dissection involving the descending thoracic aorta and abdominal aorta. There is may be a small dissection involving the celiac trunk which has not significantly changed. 3. Main visceral arteries are widely patent. 4. No acute abnormality in the chest, abdomen or pelvis. Electronically Signed   By: Markus Daft M.D.   On: 05/04/2018 16:09    Physical Exam: BP 122/74   Pulse 80   Ht 5\' 8"  (1.727 m)   Wt 182 lb (82.6 kg)   BMI 27.67 kg/m  Constitutional: Pleasant,well-developed, male in no acute distress. HEENT: Normocephalic and atraumatic. Conjunctivae are normal. No scleral icterus. Neck supple.  Cardiovascular: Normal rate, regular rhythm.  Pulmonary/chest: Effort normal and breath sounds normal. No wheezing, rales or rhonchi. Abdominal: Soft, nondistended, nontender. Bowel sounds active throughout. There  are no masses palpable. No hepatomegaly. DRE - Anoscopy - small anal fissure, external hemorrhoid, internal hemorrhoids in all positions, no mass lesions  Extremities: no edema Lymphadenopathy: No cervical adenopathy noted. Neurological: Alert and oriented to person place and time. Skin: Skin is warm and dry. No rashes noted. Psychiatric: Normal mood and affect. Behavior is normal.   ASSESSMENT AND PLAN: 77 year old male here for new patient assessment of the following issues:  Hemorrhoids / anticoagulated - he has moderate-sized internal hemorrhoids on anoscopy in all positions. He also has an anal fissure noted on today's exam as well. I discussed options with him. If his symptoms are mild and intermittent at this time, would recommend conservative therapy with a daily fiber supplement, minimize time spent on the toilet with bowel movements (no straining or reading), and use of topical hydrocortisone as needed for irritation. We discussed options for interventional therapy to include banding and surgery. He wants to avoid surgery if at all possible which I agree with. He would be at higher than average risk for bleeding being on Coumadin, although the risk for bleeding with banding while on Coumadin remains quite low. In this light, if he has more persistent or frequent symptoms of hemorrhoids I would offer him banding while on Coumadin. We discussed this for a bit and  he was in agreement, he'll try conservative measures first and if his symptoms bother him in the future more frequently he will contact me to schedule banding. I will otherwise obtain report from his last colonoscopy to confirm findings.  Anal fissure - small anal fissure noted on rectal exam today. This may be periodically contributing to his symptoms as well. Recommend topical nitroglycerin 0.125% applied two to three times daily for the next 2 weeks or so, and can use it PRN moving forward. He agreed.    Cellar,  MD [facility]  CC: Eulas Post, MD

## 2018-06-01 ENCOUNTER — Other Ambulatory Visit: Payer: Medicare HMO

## 2018-06-01 ENCOUNTER — Other Ambulatory Visit (INDEPENDENT_AMBULATORY_CARE_PROVIDER_SITE_OTHER): Payer: Medicare HMO

## 2018-06-01 DIAGNOSIS — R7989 Other specified abnormal findings of blood chemistry: Secondary | ICD-10-CM | POA: Diagnosis not present

## 2018-06-01 LAB — CBC WITH DIFFERENTIAL/PLATELET
Basophils Absolute: 0 10*3/uL (ref 0.0–0.1)
Basophils Relative: 0.6 % (ref 0.0–3.0)
Eosinophils Absolute: 0.2 10*3/uL (ref 0.0–0.7)
Eosinophils Relative: 2.6 % (ref 0.0–5.0)
HCT: 44 % (ref 39.0–52.0)
Hemoglobin: 15.2 g/dL (ref 13.0–17.0)
LYMPHS ABS: 3.3 10*3/uL (ref 0.7–4.0)
Lymphocytes Relative: 48.2 % — ABNORMAL HIGH (ref 12.0–46.0)
MCHC: 34.5 g/dL (ref 30.0–36.0)
MCV: 90.9 fl (ref 78.0–100.0)
MONO ABS: 0.7 10*3/uL (ref 0.1–1.0)
Monocytes Relative: 10 % (ref 3.0–12.0)
NEUTROS PCT: 38.6 % — AB (ref 43.0–77.0)
Neutro Abs: 2.7 10*3/uL (ref 1.4–7.7)
Platelets: 145 10*3/uL — ABNORMAL LOW (ref 150.0–400.0)
RBC: 4.85 Mil/uL (ref 4.22–5.81)
RDW: 14.1 % (ref 11.5–15.5)
WBC: 6.9 10*3/uL (ref 4.0–10.5)

## 2018-06-02 ENCOUNTER — Encounter: Payer: Self-pay | Admitting: Family Medicine

## 2018-06-02 ENCOUNTER — Other Ambulatory Visit: Payer: Medicare HMO

## 2018-06-21 ENCOUNTER — Ambulatory Visit: Payer: Medicare HMO

## 2018-06-26 ENCOUNTER — Ambulatory Visit: Payer: Medicare HMO

## 2018-07-03 ENCOUNTER — Ambulatory Visit (INDEPENDENT_AMBULATORY_CARE_PROVIDER_SITE_OTHER): Payer: Medicare HMO | Admitting: General Practice

## 2018-07-03 DIAGNOSIS — Z9889 Other specified postprocedural states: Secondary | ICD-10-CM

## 2018-07-03 DIAGNOSIS — Z7901 Long term (current) use of anticoagulants: Secondary | ICD-10-CM

## 2018-07-03 DIAGNOSIS — I4891 Unspecified atrial fibrillation: Secondary | ICD-10-CM

## 2018-07-03 LAB — POCT INR: INR: 2.7 (ref 2.0–3.0)

## 2018-07-03 NOTE — Patient Instructions (Addendum)
Pre visit review using our clinic review tool, if applicable. No additional management support is needed unless otherwise documented below in the visit note.  Continue taking 5mg daily except 7.5mg on Tuesdays and Saturdays. Recheck in 6 weeks. 

## 2018-07-29 ENCOUNTER — Encounter: Payer: Self-pay | Admitting: Family Medicine

## 2018-08-04 NOTE — Telephone Encounter (Signed)
Appointment rescheduled to 08/21/2018 @ 8:00.

## 2018-08-14 ENCOUNTER — Ambulatory Visit: Payer: Medicare HMO

## 2018-08-21 ENCOUNTER — Ambulatory Visit (INDEPENDENT_AMBULATORY_CARE_PROVIDER_SITE_OTHER): Payer: Medicare HMO | Admitting: General Practice

## 2018-08-21 DIAGNOSIS — Z9889 Other specified postprocedural states: Secondary | ICD-10-CM

## 2018-08-21 DIAGNOSIS — Z7901 Long term (current) use of anticoagulants: Secondary | ICD-10-CM

## 2018-08-21 DIAGNOSIS — I4891 Unspecified atrial fibrillation: Secondary | ICD-10-CM

## 2018-08-21 LAB — POCT INR: INR: 2.7 (ref 2.0–3.0)

## 2018-08-21 NOTE — Patient Instructions (Addendum)
Pre visit review using our clinic review tool, if applicable. No additional management support is needed unless otherwise documented below in the visit note.  Continue taking 5mg daily except 7.5mg on Tuesdays and Saturdays. Recheck in 6 weeks. 

## 2018-08-30 ENCOUNTER — Encounter (HOSPITAL_COMMUNITY): Payer: Medicare HMO

## 2018-08-30 ENCOUNTER — Ambulatory Visit: Payer: Medicare HMO | Admitting: Vascular Surgery

## 2018-09-27 ENCOUNTER — Ambulatory Visit (HOSPITAL_COMMUNITY)
Admission: RE | Admit: 2018-09-27 | Discharge: 2018-09-27 | Disposition: A | Discharge status: Home / Self Care | Payer: Medicare HMO | Source: Ambulatory Visit | Attending: Vascular Surgery | Admitting: Vascular Surgery

## 2018-09-27 ENCOUNTER — Ambulatory Visit (INDEPENDENT_AMBULATORY_CARE_PROVIDER_SITE_OTHER): Payer: Medicare HMO | Admitting: Vascular Surgery

## 2018-09-27 ENCOUNTER — Encounter: Payer: Self-pay | Admitting: Vascular Surgery

## 2018-09-27 VITALS — BP 148/68 | HR 61 | Temp 97.3°F | Resp 16 | Ht 68.0 in | Wt 185.6 lb

## 2018-09-27 DIAGNOSIS — I724 Aneurysm of artery of lower extremity: Secondary | ICD-10-CM | POA: Diagnosis not present

## 2018-09-27 NOTE — Progress Notes (Signed)
Patient name: Don Medina MRN: [id number] DOB: February 03, 1941 Sex: male  REASON FOR VISIT:   23-month follow-up visit.  HPI:   Don Medina is a pleasant 78 y.o. male who I saw in consultation on 02/27/2018 with a right popliteal artery aneurysm that was found on MRI.  Of note, the patient is also status post repair of an aortic dissection by Dr. Darius Bump.  On his most recent CT scan there was some chronic dissection extending down the infrarenal aorta and into the common iliac artery.  His right popliteal artery measured 1.6 cm in maximum diameter.  I felt this was fairly small and given his age I would not recommend elective repair unless this enlarge significantly or became symptomatic.  He comes in for 70-month follow-up visit.  Since I saw him last, he denies any claudication, rest pain, or nonhealing ulcers.  He is on Coumadin for atrial fibrillation.  He remains very active and goes to the gym with his buddies 4-5 times a week.  There have been no significant changes in his medical history.  Past Medical History:  Diagnosis Date  . 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 Arthritis   . Atrial fibrillation ([place]) [I48.91] 05/23/2017  . Blood in the stool   . BRADYCARDIA 03/10/2010   Qualifier: Diagnosis of  By: Elease Hashimoto MD, Bruce    . CHEST PAIN 03/19/2010   Qualifier: Diagnosis of  By: Allean Found, LPN, Christine    . Chicken pox   . COLONIC POLYPS, HX OF 01/13/2009   Qualifier: Diagnosis of  By: Valma Cava LPN, Izora Gala    . Episode of generalized weakness 02/17/2016  . GERD (gastroesophageal reflux disease) 10/03/2012  . Hay fever   . History of colonic polyps   . History of colonoscopy   . Hyperlipidemia   . MUSCLE STRAIN, HAMSTRING MUSCLE 09/08/2009   Qualifier: Diagnosis of  By: Elease Hashimoto MD, Bruce    . ORTHOSTATIC DIZZINESS 03/05/2010   Qualifier: Diagnosis of  By: Elease Hashimoto MD, Bruce    . Other  premature beats 03/17/2010   Qualifier: Diagnosis of  By: Lovena Le, MD, Martyn Malay   . Premature ventricular contraction 01/05/2011  . S/P aortic dissection repair 05/08/2017  . THROMBOCYTOPENIA 03/05/2010   Qualifier: Diagnosis of  By: Elease Hashimoto MD, Bruce    . Weakness 12/09/2015    Family History  Problem Relation Age of Onset  . Alzheimer's disease Father 75  . Dementia Father   . Stroke Mother 41  . Cancer Brother        Leukemia  . Sudden death Other        family  . Hypertension Unknown   . Heart disease Neg Hx     SOCIAL HISTORY: Social History   Tobacco Use  . Smoking status: Former Smoker    Last attempt to quit: 08/09/1974    Years since quitting: 44.1  . Smokeless tobacco: Never Used  Substance Use Topics  . Alcohol use: No    Allergies  Allergen Reactions  . 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 Quinolones     Patient was warned about not using Cipro and similar antibiotics. Recent studies have raised concern that fluoroquinolone antibiotics could be associated with an increased risk of aortic aneurysm Fluoroquinolones have non-antimicrobial properties that might jeopardise the integrity of the extracellular matrix of the vascular  wall In a  propensity score matched cohort study in [place], there was a 66% increased rate of aortic aneurysm or dissection associated with oral fluoroquinolone use, compared wit    Current Outpatient Medications  Medication Sig Dispense Refill  . AMBULATORY NON FORMULARY MEDICATION Medication Name: Nitroglycerine ointment 0.125 %  Apply a pea sized amount internally 2 to 3 times daily for 2 weeks Dispense 30 GM zero refill 30 g 0  . aspirin 81 MG tablet Take 1 tablet (81 mg total) by mouth daily. 30 tablet   . citalopram (CELEXA) 20 MG tablet Take 1 tablet (20 mg total) by mouth daily. 90 tablet 3  . methylcellulose (CITRUCEL) oral powder Take 1 packet by mouth daily.    . metoprolol tartrate  (LOPRESSOR) 25 MG tablet TAKE 1/2 (ONE-HALF) TABLET BY MOUTH TWICE DAILY 90 tablet 3  . Misc Natural Products (GLUCOS-CHONDROIT-MSM COMPLEX) TABS Take 1 tablet by mouth 3 (three) times daily.    . Omega-3 Fatty Acids (FISH OIL) 1000 MG CAPS Take 1 capsule by mouth daily.    . pantoprazole (PROTONIX) 40 MG tablet Take 1 tablet (40 mg total) by mouth daily. As needed for heartburn 90 tablet 3  . simvastatin (ZOCOR) 20 MG tablet Take 1 tablet (20 mg total) by mouth at bedtime. 90 tablet 3  . warfarin (COUMADIN) 2.5 MG tablet TAKE AS DIRECTED BY MOUTH BY COUMADIN CLINIC 75 tablet 3   No current facility-administered medications for this visit.     REVIEW OF SYSTEMS:  [X]  denotes positive finding, [ ]  denotes negative finding Cardiac  Comments:  Chest pain or chest pressure:    Shortness of breath upon exertion:    Short of breath when lying flat:    Irregular heart rhythm:        Vascular    Pain in calf, thigh, or hip brought on by ambulation:    Pain in feet at night that wakes you up from your sleep:     Blood clot in your veins:    Leg swelling:         Pulmonary    Oxygen at home:    Productive cough:     Wheezing:         Neurologic    Sudden weakness in arms or legs:     Sudden numbness in arms or legs:     Sudden onset of difficulty speaking or slurred speech:    Temporary loss of vision in one eye:     Problems with dizziness:         Gastrointestinal    Blood in stool:     Vomited blood:         Genitourinary    Burning when urinating:     Blood in urine:        Psychiatric    Major depression:         Hematologic    Bleeding problems:    Problems with blood clotting too easily:        Skin    Rashes or ulcers:        Constitutional    Fever or chills:     PHYSICAL EXAM:   Vitals:   09/27/18 1521  BP: (!) 148/68  Pulse: 61  Resp: 16  Temp: (!) 97.3 F (36.3 C)  TempSrc: Oral  SpO2: 96%  Weight: 185 lb 10 oz (84.2 kg)  Height: 5\' 8"  (1.727 m)      GENERAL: The patient is  a well-nourished male, in no acute distress. The vital signs are documented above. CARDIAC: There is a regular rate and rhythm.  VASCULAR: I do not detect carotid bruits. He has palpable femoral, popliteal, and posterior tibial pulses bilaterally.  He has a palpable right dorsalis pedis pulse.  I cannot palpate a left dorsalis pedis pulse. He has no evidence of atheroembolic disease. PULMONARY: There is good air exchange bilaterally without wheezing or rales. ABDOMEN: Soft and non-tender with normal pitched bowel sounds.  MUSCULOSKELETAL: There are no major deformities or cyanosis. NEUROLOGIC: No focal weakness or paresthesias are detected. SKIN: There are no ulcers or rashes noted. PSYCHIATRIC: The patient has a normal affect.  DATA:    ARTERIAL DUPLEX: I have independently interpreted his arterial duplex scan today.  The maximum diameter of his right popliteal artery is 2 cm.  The maximum diameter of his left popliteal artery is 1.5 cm.  Thus there has been some slight enlargement in size of the right popliteal artery aneurysm.  The left popliteal artery had not been previously studied.  MEDICAL ISSUES:   RIGHT POPLITEAL ARTERY ANEURYSM: His right popliteal artery aneurysm is increased in size from 1.6 cm to 2.0 cm.  Given the increase in size I would like to repeat his CT angiogram to further evaluate his popliteal arteries bilaterally and also his abdominal aorta and iliac arteries given his dissection.  If he has generalized ectasia of the right popliteal artery without evidence of significant risk for embolic disease then I think we could continue to follow this.  However if he has a focal aneurysmal disease of the popliteal artery I think we need to consider repairing his right popliteal artery aneurysm.  He would need preoperative vein mapping.  I will make further recommendations pending the results of his arteriogram.  In addition I will obtain a map of the  right great saphenous vein when he returns for his follow-up visit.  Deitra Mayo [facility] [phone]

## 2018-09-28 ENCOUNTER — Other Ambulatory Visit: Payer: Self-pay

## 2018-09-28 DIAGNOSIS — I724 Aneurysm of artery of lower extremity: Secondary | ICD-10-CM

## 2018-10-02 ENCOUNTER — Ambulatory Visit (INDEPENDENT_AMBULATORY_CARE_PROVIDER_SITE_OTHER): Payer: Medicare HMO | Admitting: General Practice

## 2018-10-02 DIAGNOSIS — Z7901 Long term (current) use of anticoagulants: Secondary | ICD-10-CM

## 2018-10-02 DIAGNOSIS — Z9889 Other specified postprocedural states: Secondary | ICD-10-CM

## 2018-10-02 DIAGNOSIS — I4891 Unspecified atrial fibrillation: Secondary | ICD-10-CM

## 2018-10-02 LAB — POCT INR: INR: 2.6 (ref 2.0–3.0)

## 2018-10-02 NOTE — Patient Instructions (Addendum)
Pre visit review using our clinic review tool, if applicable. No additional management support is needed unless otherwise documented below in the visit note.  Continue taking 5mg daily except 7.5mg on Tuesdays and Saturdays. Recheck in 6 weeks. 

## 2018-10-05 ENCOUNTER — Other Ambulatory Visit: Payer: Self-pay | Admitting: Cardiovascular Disease

## 2018-10-06 NOTE — Progress Notes (Signed)
CARDIOLOGY OFFICE NOTE  Date:  10/10/2018    Don Medina Date of Birth: [birth year] Medical Record #[id number]  PCP:  Eulas Post, MD  Cardiologist:  Gillian Shields   No chief complaint on file.   History of Present Illness: Don Medina is a 78 y.o. male seen f/u  for acute type A aortic dissection.   He has a history of HLD and PVCs. No history of CAD, heart attack, HTN, HLD or DM. No prior tobacco use.  Had new onset chest and back pain September 2018 with dissection    He was taken to the OR by Dr. Servando Snare 05/08/17 he underwent grafting of the ascending aorta above the sinus with resuspension of AV  Right innominate tied off. Balloon stent graft extending past left subclavian with graft to graft in ascending aorta bifucated to innominate and carotid and graft to graft left subclavian. 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   Post op echo reviewed 06/07/17 Ascending aorta 3.7 cm no AR EF 55-60%   Cardiac CTA 06/09/17 with no significant CAD stable repair   Had right popliteal aneurysm 1.8 cm x 2.0 cm 09/27/18 followed by Doren Custard  May need repair since increase in size having CT angio 10/25/18  Bruising easy. Discussed need for lifelong coumadin   He seems confused about plan for Rx popliteal arteries Told him to discuss with Dr Doren Custard After angiogram   Past Medical History:  Diagnosis Date  . 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 Arthritis   . Atrial fibrillation ([place]) [I48.91] 05/23/2017  . Blood in the stool   . BRADYCARDIA 03/10/2010   Qualifier: Diagnosis of  By: Elease Hashimoto MD, Bruce    . CHEST PAIN 03/19/2010   Qualifier: Diagnosis of  By: Allean Found, LPN, Christine    . Chicken pox   . COLONIC POLYPS, HX OF 01/13/2009   Qualifier: Diagnosis of  By: Valma Cava LPN,  Izora Gala    . Episode of generalized weakness 02/17/2016  . GERD (gastroesophageal reflux disease) 10/03/2012  . Hay fever   . History of colonic polyps   . History of colonoscopy   . Hyperlipidemia   . MUSCLE STRAIN, HAMSTRING MUSCLE 09/08/2009   Qualifier: Diagnosis of  By: Elease Hashimoto MD, Bruce    . ORTHOSTATIC DIZZINESS 03/05/2010   Qualifier: Diagnosis of  By: Elease Hashimoto MD, Bruce    . Other premature beats 03/17/2010   Qualifier: Diagnosis of  By: Lovena Le, MD, Martyn Malay   . Premature ventricular contraction 01/05/2011  . S/P aortic dissection repair 05/08/2017  . THROMBOCYTOPENIA 03/05/2010   Qualifier: Diagnosis of  By: Elease Hashimoto MD, Bruce    . Weakness 12/09/2015    Past Surgical History:  Procedure Laterality Date  . ABDOMINAL ADHESION SURGERY  2007  . AORTIC INTERVENTION N/A 05/08/2017   Procedure: HYPOTHERMIC CIRCULATORY ARREST;  Surgeon: [name], MD;  Location: [facility];  Service: Open Heart Surgery;  Laterality: N/A;  . ASCENDING AORTIC ROOT REPLACEMENT N/A 05/08/2017   Procedure: REPLACEMENT OF ASCENDING AORTIC ARCH;  Surgeon: [name], MD;  Location: [facility];  Service: Open Heart Surgery;  Laterality: N/A;  . CANNULATION FOR CARDIOPULMONARY BYPASS Right 05/08/2017   Procedure: AXILLARY CANNULATION;  Surgeon: [name], MD;  Location: [facility];  Service: Open Heart Surgery;  Laterality: Right;  . ENDOVASCULAR REPAIR/STENT GRAFT  05/08/2017   Procedure: STENT OF LEFT SUBCLAVIAN ARTERY with 10x39VBX;  Surgeon: [name], MD;  Location: [facility];  Service: Open Heart Surgery;;  . herniated diks surgery L-5  1996  . herniated disk surgery c 6-7  1986  . INTRAOPERATIVE TRANSESOPHAGEAL ECHOCARDIOGRAM N/A 05/08/2017   Procedure: INTRAOPERATIVE TRANSESOPHAGEAL ECHOCARDIOGRAM;  Surgeon: [name], MD;  Location: [facility] OR;  Service: Open Heart Surgery;  Laterality: N/A;  . REPLACEMENT ASCENDING AORTA N/A 05/08/2017   Procedure: REPLACEMENT ASCENDING AORTA;   Surgeon: [name], MD;  Location: [facility];  Service: Open Heart Surgery;  Laterality: N/A;  . THORACIC AORTIC ENDOVASCULAR STENT GRAFT  05/08/2017   Procedure: STENT GRAFTING OF DESCENDING THORACIC AORTA;  Surgeon: [name], MD;  Location: [facility] OR;  Service: Open Heart Surgery;;     Medications: Current Meds  Medication Sig  . AMBULATORY NON FORMULARY MEDICATION Medication Name: Nitroglycerine ointment 0.125 %  Apply a pea sized amount internally 2 to 3 times daily for 2 weeks Dispense 30 GM zero refill  . aspirin 81 MG tablet Take 1 tablet (81 mg total) by mouth daily.  . citalopram (CELEXA) 20 MG tablet Take 1 tablet (20 mg total) by mouth daily.  . metoprolol tartrate (LOPRESSOR) 25 MG tablet TAKE 1/2 (ONE-HALF) TABLET BY MOUTH TWICE DAILY  . Misc Natural Products (GLUCOS-CHONDROIT-MSM COMPLEX) TABS Take 1 tablet by mouth 3 (three) times daily.  . Omega-3 Fatty Acids (FISH OIL) 1000 MG CAPS Take 1 capsule by mouth daily.  . simvastatin (ZOCOR) 20 MG tablet Take 1 tablet (20 mg total) by mouth at bedtime.  [name] Kitchen warfarin (COUMADIN) 2.5 MG tablet TAKE AS DIRECTED BY MOUTH BY COUMADIN CLINIC     Allergies: Allergies  Allergen Reactions  . 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 Quinolones     Patient was warned about not using Cipro and similar antibiotics. Recent studies have raised concern that fluoroquinolone antibiotics could be associated with an increased risk of aortic aneurysm Fluoroquinolones have non-antimicrobial properties that might jeopardise the integrity of the extracellular matrix of the vascular wall In a  propensity score matched cohort study in [place], there was a 66% increased rate of aortic aneurysm or dissection associated with oral fluoroquinolone use, compared wit    Social History: The patient  reports that he quit smoking about 44 years ago. He has never used smokeless tobacco. He reports that he does not drink  alcohol or use drugs.   Family History: The patient's family history includes Alzheimer's disease (age of onset: 65) in his father; Cancer in his brother; Dementia in his father; Hypertension in his unknown relative; Stroke (age of onset: 59) in his mother; Sudden death in an other family member.   Review of Systems: Please see the history of present illness.   Otherwise, the review of systems is positive for none.   All other systems are reviewed and negative.   Physical Exam: VS:  BP 128/80   Pulse (!) 58   Ht 5\' 8"  (1.727 m)   Wt 84.1 kg   SpO2 98%   BMI 28.19 kg/m  .  BMI Body mass index is 28.19 kg/m.  Wt Readings from Last 3 Encounters:  10/10/18 84.1 kg  09/27/18 84.2 kg  05/31/18 82.6 kg    Affect appropriate Healthy:  appears stated age 89: normal Neck supple with  no adenopathy JVP normal no bruits no thyromegaly Lungs clear with no wheezing and good diaphragmatic motion Heart:  S1/S2 AR murmur, no rub, gallop or click PMI normal post sternotomy  Abdomen: benighn, BS positve, no tenderness, no AAA no bruit.  No HSM or HJR Distal pulses intact with no bruits No edema Neuro non-focal Skin warm and dry No muscular weakness    LABORATORY DATA:  EKG:  10/10/18 SR rate 58 nonspecific ST changes   Lab Results  Component Value Date   WBC 6.9 06/01/2018   HGB 15.2 06/01/2018   HCT 44.0 06/01/2018   PLT 145.0 (L) 06/01/2018   GLUCOSE 100 (H) 05/02/2018   CHOL 140 05/02/2018   TRIG 87.0 05/02/2018   HDL 49.30 05/02/2018   LDLCALC 73 05/02/2018   ALT 13 05/02/2018   AST 22 05/02/2018   NA 139 05/02/2018   K 4.4 05/02/2018   CL 103 05/02/2018   CREATININE 1.20 05/02/2018   BUN 18 05/02/2018   CO2 28 05/02/2018   TSH 5.980 (H) 06/01/2017   PSA 1.55 05/02/2018   INR 2.6 10/02/2018   HGBA1C 6.1 (H) 12/09/2015     BNP (last 3 results) No results for input(s): BNP in the last 8760 hours.  ProBNP (last 3 results) No results for input(s): PROBNP in  the last 8760 hours.   Other Studies Reviewed Today:  TEE: 05/08/2017  Left ventricle: Normal cavity size and wall thickness. LV systolic function is normal with an EF of 55-60%. There are no obvious wall motion abnormalities.  Aortic valve: The valve is trileaflet. Severe regurgitation.  Aorta: The ascending aorta is dilated. 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  Right ventricle: Cavity is moderately to severely dilated. Moderately reduced systolic function.  Right atrium: Cavity is dilated.  Aorta: Graft present in the ascending aorta.     Assessment/Plan:  1. Type A Aortic Dissection F/U Gerhardt repair intact on cardiac CT 06/29/17 BP under good control  Review of TTE 04/11/18 shows mild AR normal EF and normal aortic root size with mild dilatation at the sinus level   2. Orthostatic hypotension/dizziness - improved with lower BP meds    3. Post op AF - now off amiodarone maintaining NSR   4. +HIT noted PLT still down 145 05/02/18 f/u primary   5. Popliteal Aneurysm:  Duplex 09/27/18 right mid popliteal 1.8 x 2.0 cm F/U with Dr Doren Custard   Given PAF, popliteal aneurysm ( currently no mural thrombus to potentially embolize distally given anticoagulation ) And thrombosis of the left subclavian vein would suggest that life long coumadin appropriate      Jenkins Rouge

## 2018-10-10 ENCOUNTER — Ambulatory Visit (INDEPENDENT_AMBULATORY_CARE_PROVIDER_SITE_OTHER): Payer: Medicare HMO | Admitting: Cardiovascular Disease

## 2018-10-10 ENCOUNTER — Encounter: Payer: Self-pay | Admitting: Cardiovascular Disease

## 2018-10-10 VITALS — BP 128/80 | HR 58 | Ht 68.0 in | Wt 185.4 lb

## 2018-10-10 DIAGNOSIS — I951 Orthostatic hypotension: Secondary | ICD-10-CM

## 2018-10-10 DIAGNOSIS — Z9889 Other specified postprocedural states: Secondary | ICD-10-CM | POA: Diagnosis not present

## 2018-10-10 NOTE — Patient Instructions (Addendum)
Medication Instructions:   If you need a refill on your cardiac medications before your next appointment, please call your pharmacy.   Lab work:  If you have labs (blood work) drawn today and your tests are completely normal, you will receive your results only by: [name] Kitchen MyChart Message (if you have MyChart) OR . A paper copy in the mail If you have any lab test that is abnormal or we need to change your treatment, we will call you to review the results.  Testing/Procedures: Your physician has requested that you have an echocardiogram in one year. Echocardiography is a painless test that uses sound waves to create images of your heart. It provides your doctor with information about the size and shape of your heart and how well your heart's chambers and valves are working. This procedure takes approximately one hour. There are no restrictions for this procedure.  Follow-Up: At [facility], you and your health needs are our priority.  As part of our continuing mission to provide you with exceptional heart care, we have created designated Provider Care Teams.  These Care Teams include your primary Cardiologist (physician) and Advanced Practice Providers (APPs -  Physician Assistants and Nurse Practitioners) who all work together to provide you with the care you need, when you need it. You will need a follow up appointment in 12 months.  Please call our office 2 months in advance to schedule this appointment.  You may see Jenkins Rouge, MD or one of the following Advanced Practice Providers on your designated Care Team:   Truitt Merle, NP Cecilie Kicks, NP . Kathyrn Drown, NP

## 2018-10-17 DIAGNOSIS — R69 Illness, unspecified: Secondary | ICD-10-CM | POA: Diagnosis not present

## 2018-10-25 ENCOUNTER — Other Ambulatory Visit: Payer: Self-pay

## 2018-10-25 ENCOUNTER — Ambulatory Visit
Admission: RE | Admit: 2018-10-25 | Discharge: 2018-10-25 | Disposition: A | Discharge status: Home / Self Care | Payer: Medicare HMO | Source: Ambulatory Visit | Attending: Vascular Surgery | Admitting: Vascular Surgery

## 2018-10-25 DIAGNOSIS — I724 Aneurysm of artery of lower extremity: Secondary | ICD-10-CM

## 2018-10-25 DIAGNOSIS — I708 Atherosclerosis of other arteries: Secondary | ICD-10-CM | POA: Diagnosis not present

## 2018-10-25 MED ORDER — IOPAMIDOL (ISOVUE-370) INJECTION 76%
125.0000 mL | Freq: Once | INTRAVENOUS | Status: AC | PRN
  Administered 2018-10-25: 125 mL via INTRAVENOUS

## 2018-10-28 ENCOUNTER — Other Ambulatory Visit: Payer: Self-pay | Admitting: Cardiovascular Disease

## 2018-10-28 ENCOUNTER — Other Ambulatory Visit: Payer: Self-pay | Admitting: Physician Assistant

## 2018-10-28 MED ORDER — WARFARIN SODIUM 2.5 MG PO TABS: ORAL_TABLET | ORAL | 1 refills | Status: DC

## 2018-10-28 NOTE — Telephone Encounter (Signed)
Patient paged the after hour answering service. Refilled coumadin, instructed the patient to call his PCP's coumadin clinic next week to figure out testing during COVID-19

## 2018-10-30 ENCOUNTER — Telehealth: Payer: Self-pay | Admitting: Vascular Surgery

## 2018-11-01 ENCOUNTER — Ambulatory Visit: Payer: Medicare HMO | Admitting: Vascular Surgery

## 2018-11-01 ENCOUNTER — Encounter (HOSPITAL_COMMUNITY): Payer: Medicare HMO

## 2018-11-01 NOTE — Telephone Encounter (Signed)
I have been following this patient with a 2 cm right popliteal artery aneurysm.  I ordered a CT angios of the abdomen and pelvis given his history of a thoracic aortic dissection and also to evaluate his popliteal arteries.  I reviewed the CT scan that was done on 10/25/2018.  The abdominal aorta measures 2.5 cm in maximum diameter.  The right common iliac artery measures 1.9 cm in maximum diameter.  The left common iliac artery measures 1.8 cm in maximum diameter.  The right popliteal artery measures 2 cm in maximum diameter.  This is what it measured back in February by duplex.  There does not appear to be a significant amount of laminated thrombus.  The left popliteal artery measured 1.2 cm in maximum diameter whereas it was 1.8 by duplex previously.  He is 78 years old and asymptomatic.  I have recommended a follow-up duplex of both popliteal arteries in 6 months and I will see him back at that time.  We will order a CT angiogram of the abdomen and pelvis in 18 months.  I have discussed these results with him over the phone today.  Deitra Mayo, MD, [place] [phone] Office: [phone]

## 2018-11-13 ENCOUNTER — Ambulatory Visit: Payer: Medicare HMO

## 2018-11-27 ENCOUNTER — Other Ambulatory Visit: Payer: Self-pay

## 2018-11-27 ENCOUNTER — Ambulatory Visit (INDEPENDENT_AMBULATORY_CARE_PROVIDER_SITE_OTHER): Payer: Medicare HMO | Admitting: General Practice

## 2018-11-27 DIAGNOSIS — Z9889 Other specified postprocedural states: Secondary | ICD-10-CM

## 2018-11-27 DIAGNOSIS — Z7901 Long term (current) use of anticoagulants: Secondary | ICD-10-CM

## 2018-11-27 DIAGNOSIS — I4891 Unspecified atrial fibrillation: Secondary | ICD-10-CM | POA: Diagnosis not present

## 2018-11-27 LAB — POCT INR: INR: 2.6 (ref 2.0–3.0)

## 2018-11-27 NOTE — Patient Instructions (Incomplete)
Pre visit review using our clinic review tool, if applicable. No additional management support is needed unless otherwise documented below in the visit note.  Continue taking 5mg daily except 7.5mg on Tuesdays and Saturdays. Recheck in 6 weeks. 

## 2018-12-19 DIAGNOSIS — D2261 Melanocytic nevi of right upper limb, including shoulder: Secondary | ICD-10-CM | POA: Diagnosis not present

## 2018-12-19 DIAGNOSIS — D485 Neoplasm of uncertain behavior of skin: Secondary | ICD-10-CM | POA: Diagnosis not present

## 2018-12-19 DIAGNOSIS — L821 Other seborrheic keratosis: Secondary | ICD-10-CM | POA: Diagnosis not present

## 2018-12-19 DIAGNOSIS — L570 Actinic keratosis: Secondary | ICD-10-CM | POA: Diagnosis not present

## 2018-12-19 DIAGNOSIS — B079 Viral wart, unspecified: Secondary | ICD-10-CM | POA: Diagnosis not present

## 2018-12-19 DIAGNOSIS — D225 Melanocytic nevi of trunk: Secondary | ICD-10-CM | POA: Diagnosis not present

## 2018-12-19 DIAGNOSIS — D2262 Melanocytic nevi of left upper limb, including shoulder: Secondary | ICD-10-CM | POA: Diagnosis not present

## 2018-12-19 DIAGNOSIS — L309 Dermatitis, unspecified: Secondary | ICD-10-CM | POA: Diagnosis not present

## 2019-01-08 ENCOUNTER — Ambulatory Visit: Payer: Medicare HMO

## 2019-01-09 ENCOUNTER — Other Ambulatory Visit: Payer: Self-pay | Admitting: Physician Assistant

## 2019-01-11 ENCOUNTER — Other Ambulatory Visit: Payer: Self-pay | Admitting: Cardiovascular Disease

## 2019-01-11 DIAGNOSIS — Z7901 Long term (current) use of anticoagulants: Secondary | ICD-10-CM

## 2019-01-11 DIAGNOSIS — I480 Paroxysmal atrial fibrillation: Secondary | ICD-10-CM

## 2019-01-11 MED ORDER — WARFARIN SODIUM 2.5 MG PO TABS: ORAL_TABLET | ORAL | 1 refills | Status: DC

## 2019-01-11 NOTE — Telephone Encounter (Signed)
Pt calling requesting a refill on Warfarin sent to [facility] on [address]. Please address

## 2019-01-11 NOTE — Telephone Encounter (Signed)
Pt is getting ready to go out of town, need refill ASAP.  Pt's Warfarin is managed at [facility] by Dario Guardian will forward rx refill request to her.

## 2019-01-24 ENCOUNTER — Ambulatory Visit (INDEPENDENT_AMBULATORY_CARE_PROVIDER_SITE_OTHER): Payer: Medicare HMO

## 2019-01-24 ENCOUNTER — Other Ambulatory Visit: Payer: Self-pay

## 2019-01-24 ENCOUNTER — Encounter: Payer: Self-pay | Admitting: Family Medicine

## 2019-01-24 ENCOUNTER — Ambulatory Visit (INDEPENDENT_AMBULATORY_CARE_PROVIDER_SITE_OTHER): Payer: Medicare HMO | Admitting: Family Medicine

## 2019-01-24 VITALS — BP 126/72 | HR 58 | Temp 97.7°F | Ht 68.0 in | Wt 186.9 lb

## 2019-01-24 DIAGNOSIS — S8012XA Contusion of left lower leg, initial encounter: Secondary | ICD-10-CM | POA: Diagnosis not present

## 2019-01-24 DIAGNOSIS — T148XXA Other injury of unspecified body region, initial encounter: Secondary | ICD-10-CM | POA: Diagnosis not present

## 2019-01-24 DIAGNOSIS — M79605 Pain in left leg: Secondary | ICD-10-CM | POA: Diagnosis not present

## 2019-01-24 DIAGNOSIS — S8002XA Contusion of left knee, initial encounter: Secondary | ICD-10-CM

## 2019-01-24 DIAGNOSIS — R600 Localized edema: Secondary | ICD-10-CM | POA: Diagnosis not present

## 2019-01-24 NOTE — Progress Notes (Signed)
Subjective:     Patient ID: Don Medina, male   DOB: 12/20/1940, 78 y.o.   MRN: [id number]  HPI   Patient is seen following an injury to his left leg this past Saturday.  He was down in [place] at his daughter's house and was trying to get some glue up off a patio and had rented a buffer.  He was in the process of turning on when it jerked and there was a rubber piece that hit against his left anterior leg.  He was able to walk right away afterwards but had some obvious bruising and then subsequently developed a large blood blister anterior leg.  He drove back and after arriving back in [place] noticed increased swelling of the left leg.  Denies any calf pain.  Past Medical History:  Diagnosis Date  . ADJ DISORDER WITH MIXED ANXIETY & DEPRESSED MOOD 09/08/2009   Qualifier: Diagnosis of  By: Caryl Never MD, Jeanett Antonopoulos    . Arthritis   . Atrial fibrillation (HCC) [I48.91] 05/23/2017  . Blood in the stool   . BRADYCARDIA 03/10/2010   Qualifier: Diagnosis of  By: Caryl Never MD, Avari Nevares    . CHEST PAIN 03/19/2010   Qualifier: Diagnosis of  By: Elyn Peers, LPN, Christine    . Chicken pox   . COLONIC POLYPS, HX OF 01/13/2009   Qualifier: Diagnosis of  By: Gabriel Rung LPN, Harriett Sine    . Episode of generalized weakness 02/17/2016  . GERD (gastroesophageal reflux disease) 10/03/2012  . Hay fever   . History of colonic polyps   . History of colonoscopy   . Hyperlipidemia   . MUSCLE STRAIN, HAMSTRING MUSCLE 09/08/2009   Qualifier: Diagnosis of  By: Caryl Never MD, Ashanti Ratti    . ORTHOSTATIC DIZZINESS 03/05/2010   Qualifier: Diagnosis of  By: Caryl Never MD, Kristalyn Bergstresser    . Other premature beats 03/17/2010   Qualifier: Diagnosis of  By: Ladona Ridgel, MD, Jerrell Mylar   . Premature ventricular contraction 01/05/2011  . S/P aortic dissection repair 05/08/2017  . THROMBOCYTOPENIA 03/05/2010   Qualifier: Diagnosis of  By: Caryl Never MD, Anitra Doxtater    . Weakness 12/09/2015   Past Surgical History:  Procedure Laterality Date  . ABDOMINAL  ADHESION SURGERY  2007  . AORTIC INTERVENTION N/A 05/08/2017   Procedure: HYPOTHERMIC CIRCULATORY ARREST;  Surgeon: Delight Ovens, MD;  Location: [facility] OR;  Service: Open Heart Surgery;  Laterality: N/A;  . ASCENDING AORTIC ROOT REPLACEMENT N/A 05/08/2017   Procedure: REPLACEMENT OF ASCENDING AORTIC ARCH;  Surgeon: Delight Ovens, MD;  Location: [facility] OR;  Service: Open Heart Surgery;  Laterality: N/A;  . CANNULATION FOR CARDIOPULMONARY BYPASS Right 05/08/2017   Procedure: AXILLARY CANNULATION;  Surgeon: Delight Ovens, MD;  Location: [facility] OR;  Service: Open Heart Surgery;  Laterality: Right;  . ENDOVASCULAR REPAIR/STENT GRAFT  05/08/2017   Procedure: STENT OF LEFT SUBCLAVIAN ARTERY with 10x39VBX;  Surgeon: Delight Ovens, MD;  Location: [facility] OR;  Service: Open Heart Surgery;;  . herniated diks surgery L-5  1996  . herniated disk surgery c 6-7  1986  . INTRAOPERATIVE TRANSESOPHAGEAL ECHOCARDIOGRAM N/A 05/08/2017   Procedure: INTRAOPERATIVE TRANSESOPHAGEAL ECHOCARDIOGRAM;  Surgeon: Delight Ovens, MD;  Location: [facility] OR;  Service: Open Heart Surgery;  Laterality: N/A;  . REPLACEMENT ASCENDING AORTA N/A 05/08/2017   Procedure: REPLACEMENT ASCENDING AORTA;  Surgeon: Delight Ovens, MD;  Location: [facility] OR;  Service: Open Heart Surgery;  Laterality: N/A;  . THORACIC AORTIC ENDOVASCULAR STENT GRAFT  05/08/2017   Procedure:  STENT GRAFTING OF DESCENDING THORACIC AORTA;  Surgeon: Delight Ovens, MD;  Location: [facility] OR;  Service: Open Heart Surgery;;    reports that he quit smoking about 44 years ago. He has never used smokeless tobacco. He reports that he does not drink alcohol or use drugs. family history includes Alzheimer's disease (age of onset: 62) in his father; Cancer in his brother; Dementia in his father; Hypertension in an other family member; Stroke (age of onset: 75) in his mother; Sudden death in an other family member. Allergies  Allergen Reactions  . 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 Quinolones     Patient was warned about not using Cipro and similar antibiotics. Recent studies have raised concern that fluoroquinolone antibiotics could be associated with an increased risk of aortic aneurysm Fluoroquinolones have non-antimicrobial properties that might jeopardise the integrity of the extracellular matrix of the vascular wall In a  propensity score matched cohort study in [place], there was a 66% increased rate of aortic aneurysm or dissection associated with oral fluoroquinolone use, compared wit     Review of Systems  Constitutional: Negative for chills and fever.  Neurological: Negative for weakness.       Objective:   Physical Exam Constitutional:      Appearance: Normal appearance.  Cardiovascular:     Rate and Rhythm: Normal rate and regular rhythm.  Pulmonary:     Effort: Pulmonary effort is normal.     Breath sounds: Normal breath sounds.  Skin:    Comments: Patient has extensive ecchymosis involving the left leg anteriorly.  From the mid shaft he has fairly large approximately 2 x 3 cm blood blister which is intact.  He has some diffuse tenderness of the anterior tibial on the midshaft.  No cellulitis changes.  Neurological:     Mental Status: He is alert.        Assessment:     Left leg pain following traumatic injury as above.  Suspect contusion with local blood blister.  No signs of secondary infection    Plan:     -Pain x-ray left tibia/fibula-no acute bony abnormality noted.  This will be over read by radiology -Keep skin intact over blood blister and clean -He is aware he will likely have some swelling for several weeks. -Follow-up promptly for any signs of secondary infection such as erythema or warmth -Continue icing and elevation  Kristian Covey MD [facility]

## 2019-01-24 NOTE — Patient Instructions (Signed)
Elevate legs frequently  You will likely have some swelling for weeks.  Don't rupture the blister prematurely.

## 2019-01-28 ENCOUNTER — Encounter: Payer: Self-pay | Admitting: Family Medicine

## 2019-01-29 ENCOUNTER — Ambulatory Visit: Payer: Medicare HMO

## 2019-02-05 ENCOUNTER — Other Ambulatory Visit: Payer: Self-pay

## 2019-02-05 ENCOUNTER — Ambulatory Visit (INDEPENDENT_AMBULATORY_CARE_PROVIDER_SITE_OTHER): Payer: Medicare HMO | Admitting: General Practice

## 2019-02-05 DIAGNOSIS — Z7901 Long term (current) use of anticoagulants: Secondary | ICD-10-CM | POA: Diagnosis not present

## 2019-02-05 DIAGNOSIS — Z9889 Other specified postprocedural states: Secondary | ICD-10-CM

## 2019-02-05 LAB — POCT INR: INR: 3.1 — AB (ref 2.0–3.0)

## 2019-02-05 NOTE — Patient Instructions (Incomplete)
Pre visit review using our clinic review tool, if applicable. No additional management support is needed unless otherwise documented below in the visit note.  Continue taking 5mg daily except 7.5mg on Tuesdays and Saturdays. Recheck in 6 weeks. 

## 2019-03-15 ENCOUNTER — Other Ambulatory Visit: Payer: Self-pay | Admitting: Cardiothoracic Surgery

## 2019-03-15 DIAGNOSIS — I7101 Dissection of thoracic aorta: Secondary | ICD-10-CM

## 2019-03-15 DIAGNOSIS — I71019 Dissection of thoracic aorta, unspecified: Secondary | ICD-10-CM

## 2019-03-15 DIAGNOSIS — Z9889 Other specified postprocedural states: Secondary | ICD-10-CM

## 2019-03-19 ENCOUNTER — Other Ambulatory Visit: Payer: Self-pay

## 2019-03-19 ENCOUNTER — Other Ambulatory Visit: Payer: Self-pay | Admitting: General Practice

## 2019-03-19 ENCOUNTER — Ambulatory Visit (INDEPENDENT_AMBULATORY_CARE_PROVIDER_SITE_OTHER): Payer: Medicare HMO | Admitting: General Practice

## 2019-03-19 DIAGNOSIS — Z7901 Long term (current) use of anticoagulants: Secondary | ICD-10-CM

## 2019-03-19 DIAGNOSIS — I480 Paroxysmal atrial fibrillation: Secondary | ICD-10-CM

## 2019-03-19 DIAGNOSIS — I4891 Unspecified atrial fibrillation: Secondary | ICD-10-CM

## 2019-03-19 DIAGNOSIS — Z9889 Other specified postprocedural states: Secondary | ICD-10-CM

## 2019-03-19 LAB — POCT INR: INR: 2.6 (ref 2.0–3.0)

## 2019-03-19 MED ORDER — WARFARIN SODIUM 2.5 MG PO TABS: ORAL_TABLET | ORAL | 1 refills | Status: DC

## 2019-03-19 NOTE — Patient Instructions (Addendum)
Pre visit review using our clinic review tool, if applicable. No additional management support is needed unless otherwise documented below in the visit note.  Continue taking 5mg daily except 7.5mg on Tuesdays and Saturdays. Recheck in 6 weeks. 

## 2019-04-30 ENCOUNTER — Ambulatory Visit (INDEPENDENT_AMBULATORY_CARE_PROVIDER_SITE_OTHER): Payer: Medicare HMO | Admitting: General Practice

## 2019-04-30 ENCOUNTER — Other Ambulatory Visit: Payer: Self-pay

## 2019-04-30 DIAGNOSIS — Z7901 Long term (current) use of anticoagulants: Secondary | ICD-10-CM | POA: Diagnosis not present

## 2019-04-30 DIAGNOSIS — Z9889 Other specified postprocedural states: Secondary | ICD-10-CM

## 2019-04-30 NOTE — Patient Instructions (Addendum)
Pre visit review using our clinic review tool, if applicable. No additional management support is needed unless otherwise documented below in the visit note.  Continue taking 5mg daily except 7.5mg on Tuesdays and Saturdays. Recheck in 6 weeks. 

## 2019-05-03 ENCOUNTER — Encounter: Payer: Self-pay | Admitting: Cardiothoracic Surgery

## 2019-05-03 ENCOUNTER — Ambulatory Visit
Admission: RE | Admit: 2019-05-03 | Discharge: 2019-05-03 | Disposition: A | Discharge status: Home / Self Care | Payer: Medicare HMO | Source: Ambulatory Visit | Attending: Cardiothoracic Surgery | Admitting: Cardiothoracic Surgery

## 2019-05-03 ENCOUNTER — Ambulatory Visit: Payer: Medicare HMO | Admitting: Cardiothoracic Surgery

## 2019-05-03 ENCOUNTER — Other Ambulatory Visit: Payer: Self-pay

## 2019-05-03 VITALS — BP 138/73 | HR 56 | Temp 96.6°F | Ht 68.0 in | Wt 186.0 lb

## 2019-05-03 DIAGNOSIS — I7101 Dissection of thoracic aorta: Secondary | ICD-10-CM

## 2019-05-03 DIAGNOSIS — I71019 Dissection of thoracic aorta, unspecified: Secondary | ICD-10-CM

## 2019-05-03 DIAGNOSIS — Z9889 Other specified postprocedural states: Secondary | ICD-10-CM

## 2019-05-03 MED ORDER — IOPAMIDOL (ISOVUE-370) INJECTION 76%
75.0000 mL | Freq: Once | INTRAVENOUS | Status: AC | PRN
  Administered 2019-05-03: 14:00:00 75 mL via INTRAVENOUS

## 2019-05-03 NOTE — Progress Notes (Signed)
[address]       [place] [address]             [phone]      Don Medina Mancos Medical Record [id number] Date of Birth: 01-03-1941  Referring: Lacretia Leigh, MD Primary Care: Eulas Post, MD Primary Cardiology:Peter Johnsie Cancel, MD  \ Chief Complaint:   POST OP FOLLOW UP 05/08/2017 OPERATIVE REPORT PREOPERATIVE DIAGNOSIS:  Acute type 1 aortic dissection. POSTOPERATIVE DIAGNOSIS:  Acute type 1 aortic dissection. SURGICAL PROCEDURES: 1. Cardiopulmonary bypass with hypothermic circulatory arrest and     antegrade cerebral perfusion. 2. Resuspension, repair of aortic valve, replacement of ascending     aorta, arch with debranching of the cerebral vessels, frozen     elephant trunk with placement of 31 x 31 x 10 cm CTAG Gore stent     graft, placement of a 10 x 39 VBX balloon expandable stent into the     left subclavian artery, a 12 x 8-cm graft off the ascending aorta     to the innominate and left carotid arteries.    History of Present Illness:     Patient returns to the office with follow-up CTA of the chest abdomen and pelvis following his acute aortic dissection repair in October 2018.      Was found to be hit positive postop, is on Coumadin for paroxysmal postop atrial fibrillation and also thrombosis of the left subclavian vein.     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 Arthritis   . Atrial fibrillation ([place]) [I48.91] 05/23/2017  . Blood in the stool   . BRADYCARDIA 03/10/2010   Qualifier: Diagnosis of  By: Elease Hashimoto MD, Bruce    .  CHEST PAIN 03/19/2010   Qualifier: Diagnosis of  By: Allean Found, LPN, Christine    . Chicken pox   . COLONIC POLYPS, HX OF 01/13/2009   Qualifier: Diagnosis of  By: Valma Cava LPN, Izora Gala    . Episode of generalized weakness 02/17/2016  . GERD (gastroesophageal reflux disease) 10/03/2012  . Hay fever   . History of colonic polyps   . History of colonoscopy   . Hyperlipidemia   . MUSCLE STRAIN, HAMSTRING MUSCLE 09/08/2009   Qualifier: Diagnosis of  By: Elease Hashimoto MD, Bruce    . ORTHOSTATIC DIZZINESS 03/05/2010   Qualifier: Diagnosis of  By: Elease Hashimoto MD, Bruce    . Other premature beats 03/17/2010   Qualifier: Diagnosis of  By: Lovena Le, MD, Martyn Malay   . Premature ventricular contraction 01/05/2011  . S/P aortic dissection repair 05/08/2017  . THROMBOCYTOPENIA 03/05/2010   Qualifier: Diagnosis of  By: Elease Hashimoto MD, Bruce    . Weakness 12/09/2015     Social History   Tobacco Use  Smoking Status Former Smoker  . Quit date: 08/09/1974  . Years since quitting: 44.7  Smokeless Tobacco Never Used    Social History   Substance and Sexual Activity  Alcohol Use No     Allergies  Allergen Reactions  . 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 Quinolones     Patient was warned about not using Cipro and similar antibiotics. Recent studies have raised concern that fluoroquinolone antibiotics could be associated with an increased risk of aortic aneurysm Fluoroquinolones have non-antimicrobial properties that might jeopardise the integrity of the extracellular matrix of the vascular wall In a  propensity score matched cohort study in [place], there was a 66% increased rate of aortic aneurysm or dissection associated with oral fluoroquinolone use, compared wit    Current Outpatient Medications  Medication Sig Dispense Refill  . AMBULATORY NON FORMULARY MEDICATION Medication Name: Nitroglycerine ointment 0.125 %  Apply a pea sized amount internally 2 to 3 times daily for  2 weeks Dispense 30 GM zero refill 30 g 0  . aspirin 81 MG tablet Take 1 tablet (81 mg total) by mouth daily. 30 tablet   . citalopram (CELEXA) 20 MG tablet Take 1 tablet (20 mg total) by mouth daily. 90 tablet 3  . metoprolol tartrate (LOPRESSOR) 25 MG tablet TAKE 1/2 (ONE-HALF) TABLET BY MOUTH TWICE DAILY 90 tablet 3  . Misc Natural Products (GLUCOS-CHONDROIT-MSM COMPLEX) TABS Take 1 tablet by mouth 3 (three) times daily.    . Omega-3 Fatty Acids (FISH OIL) 1000 MG CAPS Take 1 capsule by mouth daily.    . simvastatin (ZOCOR) 20 MG tablet Take 1 tablet (20 mg total) by mouth at bedtime. 90 tablet 3  . warfarin (COUMADIN) 2.5 MG tablet Take 2 tablets daily except take 3 tablets on Tuesdays and Sat or Take as directed per coumadin clinic 200 tablet 1   No current facility-administered medications for this visit.        Physical Exam: BP 138/73 (BP Location: Right Arm, Patient Position: Sitting, Cuff Size: Normal)   Pulse (!) 56   Temp (!) 96.6 F (35.9 C)   Ht 5\' 8"  (1.727 m)   Wt 186 lb (84.4 kg)   SpO2 97% Comment: RA  BMI 28.28 kg/m  General appearance: alert, cooperative and no distress Head: Normocephalic, without obvious abnormality, atraumatic Neck: no adenopathy, no carotid bruit, no JVD, supple, symmetrical, trachea midline and thyroid not enlarged, symmetric, no tenderness/mass/nodules Lymph nodes: Cervical, supraclavicular, and axillary nodes normal. Resp: clear to auscultation bilaterally Cardio: regular rate and rhythm, S1, S2 normal, no murmur, click, rub or gallop GI: soft, non-tender; bowel sounds normal; no masses,  no organomegaly Extremities: extremities normal, atraumatic, no cyanosis or edema and Homans sign is negative, no sign of DVT Neurologic: Grossly normal Sternum stable and well-healed, prominent bilateral popliteal pulses on exam  Diagnostic Studies & Laboratory data:     Recent Radiology Findings:  Ct Angio Chest Aorta W &/or Wo Contrast  Result  Date: 05/03/2019 CLINICAL DATA:  Thoracic aortic dissection follow-up study after [facility]. EXAM: CT ANGIOGRAPHY CHEST WITH and without CONTRAST TECHNIQUE: Multidetector CT imaging of the chest was performed without contrast, subsequently images were acquired utilizing standard protocol during bolus administration of intravenous contrast. Multiplanar CT image reconstructions and MIPs were obtained to evaluate the vascular anatomy. Creatinine was obtained on site at [facility] at [address] Results: Creatinine 1.0 mg/dL. CONTRAST:  78mL ISOVUE-370 IOPAMIDOL (ISOVUE-370) INJECTION 76% COMPARISON:  05/04/2018, 09/17/2017 FINDINGS: Cardiovascular: Aortic repair and tender grafting with postoperative changes of the innominate and left common carotid artery along with stenting of the left subclavian artery are redemonstrated, unchanged. Dissection beginning just below the endovascular graft with dissection flap also unchanged. Appearances also unremarkable on delayed imaging  with respect to the endograft. Innominate artery into right subclavian and right carotid, left carotid and left subclavian arteries are all patent. Visualized vertebral arteries are patent. Central pulmonary vasculature is normal. Heart size mildly enlarged and stable. Coronary arteries are not well assessed. Mediastinum/Nodes: Postoperative changes, no signs of adenopathy. No hilar adenopathy. No axillary adenopathy. Lungs/Pleura: No signs of effusion or consolidation. Airways are patent. Upper Abdomen: Upper abdominal contents with cholelithiasis, see dedicated abdomen and pelvis report for further detail. Musculoskeletal: Post median sternotomy. No acute or destructive bone process. L1 compression fracture with mild kyphosis similar to previous exam. Review of the MIP images confirms the above findings. IMPRESSION: 1. Redemonstration of postoperative changes related to repair of the type A aortic dissection including endo grafting of  the distal aortic arch and left subclavian artery along with replacement of origin of right innominate and left carotid arteries. No acute findings and no change since prior exam. Electronically Signed   By: Zetta Bills M.D.   On: 05/03/2019 14:32   Ct Angio Abd/pel W/ And/or W/o  Result Date: 05/03/2019 CLINICAL DATA:  Post dissection follow-up EXAM: CTA ABDOMEN AND PELVIS WITHOUT AND WITH CONTRAST TECHNIQUE: Multidetector CT imaging of the abdomen and pelvis was performed using the standard protocol during bolus administration of intravenous contrast. Multiplanar reconstructed images and MIPs were obtained and reviewed to evaluate the vascular anatomy. Creatinine was obtained on site at [facility] at [address] Results: Creatinine 1.0 mg/dL. CONTRAST:  19mL ISOVUE-370 IOPAMIDOL (ISOVUE-370) INJECTION 76% COMPARISON:  Multiple prior studies most recent of May 04, 2018 FINDINGS: VASCULAR Aorta: Chest in sites of blood along with calcification similar to the prior exam. Maximal caliber 2.5 cm is unchanged. Celiac: Arises from the true lumen with some median arcuate ligament impression, widely patent and well opacified. SMA: Also arising from the true lumen, well opacified and widely patent. Renals: Right renal artery arising from the true lumen. Main left renal artery straddling the dissection flap on the left. Tiny accessory branch to the lower pole on the left with single renal artery on the right. IMA: Widely patent arising from anterior aorta. Inflow: Dissection propagates from distal thoracic arch as on previous studies into the abdominal aorta and left common iliac artery. Proximal Outflow: No change in the appearance of distal abdominal aorta. Stable dilation of the right common iliac artery, approximately 1.9 in greatest axial dimension. Stable dilation of the left common iliac artery also nearing 1.8 cm. Dissection propagating into the proximal aspect left common iliac artery.  Tortuosity the bilateral iliac arteries Veins: Venous structures not well assessed in the abdomen, delayed imaging extends only through the upper abdomen. Visualized veins of the upper abdomen are unremarkable. Review of the MIP images confirms the above findings. NON-VASCULAR Lower chest: See dedicated chest evaluation, reported separately, acquired concurrently. Hepatobiliary: Cholelithiasis. Normal appearance of liver, no signs of biliary ductal dilation. Pancreas: Unremarkable. Spleen: Normal size, no focal lesion. Adrenals/Urinary Tract: Adrenal glands are normal. There is cortical scarring associated with lower pole right kidney not changed from prior study. No signs of hydronephrosis. Stomach/Bowel: CT appearance of stomach, small and large bowel is unremarkable. No signs of acute abdominal process related to the gastrointestinal tract. The appendix is normal. Lymphatic: No signs of adenopathy in the abdomen or pelvis. Reproductive: Prostate unremarkable on CT. Other: The urinary bladder unremarkable, no signs of acute finding in the abdomen or pelvis such as free air. No evidence of focal abdominal fluid collection. Musculoskeletal: L1  compression fracture as before. No signs of acute bone finding or evidence of destructive bone process. IMPRESSION: VASCULAR No change in the appearance of dissection and origin of visceral branches in the abdomen compared to prior study. NON-VASCULAR Cholelithiasis and renal cortical scarring as before. Abdominal visceral evaluation is limited due to the arterial phase acquisition. Delayed imaging extends through the upper abdomen, imaged portions of these viscera are. Electronically Signed   By: Zetta Bills M.D.   On: 05/03/2019 14:48     Ct Angio Chest Aorta W/cm &/or Wo/cm  Result Date: 05/04/2018 CLINICAL DATA:  78 year old with history of thoracic aortic dissection. Ascending aortic replacement of 05/08/2017. EXAM: CT ANGIOGRAPHY CHEST, ABDOMEN AND PELVIS  TECHNIQUE: Multidetector CT imaging through the chest, abdomen and pelvis was performed using the standard protocol during bolus administration of intravenous contrast. Multiplanar reconstructed images and MIPs were obtained and reviewed to evaluate the vascular anatomy. CONTRAST:  36mL ISOVUE-370 IOPAMIDOL (ISOVUE-370) INJECTION 76% COMPARISON:  Chest CT 04/06/2018 FINDINGS: CTA CHEST FINDINGS Cardiovascular: Stable postsurgical changes compatible with replacement of the ascending thoracic aorta, placement of aortic arch stent, replacement of the origin of the right innominate artery and left common carotid artery and stent placement in the left subclavian artery. The ascending aortic root replacement is widely patent. The graft supplying the left common carotid artery and right innominate artery are patent. Previously, there was a dissection involving the left common carotid artery but this has resolved. Bilateral common carotid arteries are patent. Bilateral vertebral arteries are patent. Bilateral subclavian arteries are patent including the stented left subclavian artery. The stent graft extends into the proximal descending thoracic aorta. Again noted is a dissection involving the descending thoracic aorta just distal to the stent graft. True lumen is probably along the ventral aspect. Configuration and size of the descending thoracic aorta and dissection are unchanged. No evidence for aortic aneurysm. Pulmonary arteries are not well opacified on this examination. There is flow in the coronary arteries but poorly characterized on this examination. Heart size is normal without significant pericardial fluid. Mediastinum/Nodes: No chest lymphadenopathy. Thyroid tissue is unremarkable. No axillary lymphadenopathy. Lungs/Pleura: Trachea and mainstem bronchi are patent. Stable 4 mm pleural-based nodule along the right upper lung on sequence 21, image 79 is unchanged since 2018 and likely benign. No large areas of  consolidation or airspace disease. No pleural effusions. Musculoskeletal: No acute bone abnormality. Review of the MIP images confirms the above findings. CTA ABDOMEN AND PELVIS FINDINGS VASCULAR Aorta: Aortic dissection extends into the abdominal aorta. Perforations within the intimal flap throughout the abdominal aorta and the aortic bifurcation. Dissection terminates at the aortic bifurcation. The distal abdominal aorta measures up to 2.6 cm which has minimally changed. The ventral lumen appears to represent the true lumen. Celiac: Origin of the celiac trunk is not well demonstrated and suspect there is underlying median arcuate ligament compression. Again, there may be a small dissection flap at the origin of the celiac trunk. The main branches of the celiac trunk are patent. SMA: SMA originates from the true lumen and the SMA is widely patent. Renals: Right renal artery originates from the true lumen and widely patent. Left renal artery appears to originate from the more posterior false lumen but main left renal artery is widely patent. There is an accessory inferior left renal artery originating from the ventral true lumen. IMA: IMA originates from the true lumen and patent. Inflow: Dissection does not extend into the iliac arteries. Stable ectasia of the right common  iliac artery measuring 1.9 cm and left common iliac artery measures 1.7 cm. Internal and external iliac arteries are patent bilaterally. Proximal femoral arteries are patent bilaterally. Veins: No obvious venous abnormality within the limitations of this arterial phase study. Review of the MIP images confirms the above findings. NON-VASCULAR Hepatobiliary: No acute abnormality to the liver or gallbladder. No significant biliary dilatation. Pancreas: Unremarkable. No pancreatic ductal dilatation or surrounding inflammatory changes. Spleen: Normal in size without focal abnormality. Adrenals/Urinary Tract: Normal adrenal glands. Scarring along the  posterior aspect of the right kidney. No hydronephrosis. Urinary bladder is unremarkable. Stomach/Bowel: Normal appearance of the stomach and duodenum. Normal appearance of small and large bowel. No evidence for bowel obstruction or inflammation. Appendix is not confidently identified. Lymphatic: No enlarged lymph nodes in the abdomen or pelvis. Reproductive: Prostate is unremarkable. Other: No free fluid. Left inguinal hernia containing fat. Negative for free air. Musculoskeletal: Stable disc space narrowing and sclerosis at L5-S1. Stable compression deformity involving the L1 vertebral body. No acute bone abnormality. Review of the MIP images confirms the above findings. IMPRESSION: 1. Stable postsurgical changes associated with treated Type A aortic dissection. The replaced ascending thoracic aorta, aortic stent graft and left subclavian stent are patent. The great vessels are patent. The dissection involving the left common carotid artery has resolved. 2. Stable appearance of the residual dissection involving the descending thoracic aorta and abdominal aorta. There is may be a small dissection involving the celiac trunk which has not significantly changed. 3. Main visceral arteries are widely patent. 4. No acute abnormality in the chest, abdomen or pelvis. Electronically Signed   By: Markus Daft M.D.   On: 05/04/2018 16:09    Dg Chest 2 View  Result Date: 07/14/2017 CLINICAL DATA:  Aortic dissection repair. EXAM: CHEST  2 VIEW COMPARISON:  CT 08/29/2016 FINDINGS: Median sternotomy. Thoracic aortic stent graft noted in stable the. Heart size normal. No focal infiltrate. No pleural effusion or pneumothorax. Surgical clips noted over the right upper chest. Improved left base subsegmental atelectasis with mild residual. Resolution of left pleural effusion. No pneumothorax . IMPRESSION: 1. Prior median sternotomy. Aortic stent graft. Aortic stent graft appears stable. Heart size stable. 2. Improved left base  subsegmental atelectasis. Interim resolution of left pleural effusion. Electronically Signed   By: Marcello Moores  Register   On: 07/14/2017 14:14     Ct Coronary Morph W/cta Cor Nancy Fetter W/ca W/cm &/or Wo/cm  Addendum Date: 06/29/2017   ADDENDUM REPORT: 06/29/2017 14:03 CLINICAL DATA:  Post Type A Dissection EXAM: Cardiac CTA MEDICATIONS: Sub lingual nitro. 4mg  and lopressor 5mg  TECHNIQUE: The patient was scanned on a Siemens [id number] slice scanner. Gantry rotation speed was 250 msecs. Collimation was . 6 mm . A 100 kV prospective scan was supposed to be triggered in the ascending aorta but appears to have started early with ROI in SVC. The 3D data set was interpreted on a dedicated work station using MPR, MIP and VRT modes. A total of 80cc of contrast was used. FINDINGS: Non-cardiac: See separate report from [facility]. No significant findings on limited lung and soft tissue windows. Coronary Arteries: Right dominant with no anomalies LM:  Less than 30% calcified disease in distal vessel LAD:  Less than 50% calcified disease in proximal and mid vessel D1:  Less than 30% calcified disease in ostial vessel D2:  Less than 30% calcified disease in ostial vessel Circumflex: Appears anomalous from RCA Courses behind aorta with no significant stenosis OM1:  Normal RCA:  Less than 30% calcified disease in proximal and mid RCA PDA:  Normal PLA:  Normal IMPRESSION: 1) No hemodynamically significant CAD. 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   On: 06/29/2017 14:03   Result Date: 06/29/2017 EXAM: OVER-READ INTERPRETATION  CT CHEST The following report is an over-read performed by radiologist Dr. Collene Leyden [facility], [place] on 06/29/2017. This over-read does not include interpretation of cardiac or coronary anatomy or pathology.  The coronary CTA interpretation by the cardiologist is attached. COMPARISON:  CT 05/08/2017 FINDINGS: Vascular: Previously seen aortic dissection again noted in the descending thoracic aorta below the stent graft which extends from the distal aortic arch into the mid descending thoracic aorta prior tube graft repair of the ascending thoracic aorta without dissection. Dissection flap again noted within the left common carotid artery, no longer visualized in the brachiocephalic artery. Stents noted within the left subclavian artery. There is mild cardiomegaly. Mediastinum/Nodes: No mediastinal, hilar, or axillary adenopathy. Trachea and esophagus are unremarkable. Lungs/Pleura: Moderate left pleural effusion. Minimal compressive atelectasis in the lower lobe. No confluent airspace opacity otherwise. Upper Abdomen: Imaging into the upper abdomen shows no acute findings. Musculoskeletal: Chest wall soft tissues are unremarkable. No acute bony abnormality. IMPRESSION: Evidence of prior repair of the type day aortic dissection with tube graft repair in the ascending thoracic aorta and stent graft repair in the distal arch and descending thoracic aorta. The dissection flap again noted, extending from the distal aspect of the stent graft into the upper abdomen. Prior repair of dissection within the brachycephalic artery and stenting of the left subclavian artery. Dissection flap continues to be noted in the left carotid artery. Moderate left pleural effusion with left lower lobe atelectasis. Electronically Signed: By: Rolm Baptise M.D. On: 06/29/2017 10:22     Recent Lab Findings: Lab Results  Component Value Date   WBC 6.9 06/01/2018   HGB 15.2 06/01/2018   HCT 44.0 06/01/2018   PLT 145.0 (L) 06/01/2018   GLUCOSE 100 (H) 05/02/2018   CHOL 140 05/02/2018   TRIG 87.0 05/02/2018   HDL 49.30 05/02/2018   LDLCALC 73 05/02/2018   ALT 13 05/02/2018   AST 22 05/02/2018   NA 139 05/02/2018   K 4.4 05/02/2018   CL  103 05/02/2018   CREATININE 1.20 05/02/2018   BUN 18 05/02/2018   CO2 28 05/02/2018   TSH 5.980 (H) 06/01/2017   INR 2.6 03/19/2019   HGBA1C 6.1 (H) 12/09/2015    Transthoracic Echocardiography  Patient:    Jye, Moos MR #:       [id number] Study Date: 04/11/2018 Gender:     M Age:        63 Height:     180.3 cm Weight:     104.8 kg BSA:        2.32 m^2 Pt. Status: Room:   ORDERING     Lanelle Bal MD  [place] MD  Muir, [place], [place] Croitoru, MD  PERFORMING   [facility], Outpatient  cc:  ------------------------------------------------------------------- LV EF: 55% -   60%  ------------------------------------------------------------------- Indications:      S/p aortic dissection repair. (Z98.89).  ------------------------------------------------------------------- History:   PMH:   Atrial fibrillation.  Risk factors:  PVC. Bradycardia. Dyslipidemia.  ------------------------------------------------------------------- Study Conclusions  - Left ventricle: The cavity size was normal. Wall thickness was   normal. Systolic function was normal. The estimated ejection   fraction was in the range of 55% to 60%. Wall motion was normal;   there were no regional wall motion abnormalities. - Aortic valve: There was mild regurgitation. - Aorta: Post dissection with mild sinus dilatation normal root   size. - Left atrium: The atrium was mildly dilated. - Atrial septum: No defect or patent foramen ovale was identified.  ------------------------------------------------------------------- Labs, prior tests, procedures, and surgery: Transthoracic echocardiography (06/07/2017).     EF was 55%.  Aortic repair (October 2018).    Aortic root repair.  ------------------------------------------------------------------- Study data:  Comparison was made to the study of 06/07/2017.   Study status:  Routine.  Procedure:  The patient reported no pain pre or post test. Transthoracic echocardiography. Image quality was adequate.  Study completion:  There were no complications. Transthoracic echocardiography.  M-mode, complete 2D, 3D, spectral Doppler, and color Doppler.  Birthdate:  Patient birthdate: June 14, 1941.  Age:  Patient is 78 yr old.  Sex:  Gender: male. BMI: 32.2 kg/m^2.  Blood pressure:     137/69  Patient status: Outpatient.  Study date:  Study date: 04/11/2018. Study time: 07:31 AM.  Location:  [facility]  -------------------------------------------------------------------  ------------------------------------------------------------------- Left ventricle:  The cavity size was normal. Wall thickness was normal. Systolic function was normal. The estimated ejection fraction was in the range of 55% to 60%. Wall motion was normal; there were no regional wall motion abnormalities.  ------------------------------------------------------------------- Aortic valve:   Trileaflet; normal thickness leaflets. Mobility was not restricted.  Doppler:  Transvalvular velocity was within the normal range. There was no stenosis. There was mild regurgitation.   ------------------------------------------------------------------- Aorta:  Post dissection with mild sinus dilatation normal root size. Aortic root: The aortic root was normal in size.  ------------------------------------------------------------------- Mitral valve:   Structurally normal valve.   Leaflet separation was normal. Mobility was not restricted.  Doppler:  Transvalvular velocity was within the normal range. There was no evidence for stenosis. There was no regurgitation.    Valve area by pressure half-time: 2.53 cm^2. Indexed valve area by pressure half-time: 1.09 cm^2/m^2.  ------------------------------------------------------------------- Left atrium:  The atrium was mildly dilated.   ------------------------------------------------------------------- Atrial septum:  No defect or patent foramen ovale was identified.   ------------------------------------------------------------------- Right ventricle:  The cavity size was normal. Wall thickness was normal. Systolic function was normal.  ------------------------------------------------------------------- Pulmonic valve:    Doppler:  Transvalvular velocity was within the normal range. There was no evidence for stenosis. There was mild regurgitation.  ------------------------------------------------------------------- Tricuspid valve:   Structurally normal valve.    Doppler: Transvalvular velocity was within the normal range. There was mild regurgitation.  ------------------------------------------------------------------- Pulmonary artery:   The main pulmonary artery was normal-sized. Systolic pressure was within the normal range.  ------------------------------------------------------------------- Right atrium:  The atrium was normal in size.  ------------------------------------------------------------------- Pericardium:  The pericardium was normal in appearance. There was no pericardial effusion.  ------------------------------------------------------------------- Systemic veins: Inferior vena cava: The vessel was normal in size. The respirophasic diameter changes were in the normal range (>= 50%), consistent with normal central venous pressure.  ------------------------------------------------------------------- Post procedure conclusions Ascending Aorta:  - Post dissection with mild sinus dilatation normal root size.  ------------------------------------------------------------------- Measurements   Left ventricle  Value          Reference  LV ID, ED, PLAX chordal                  49    mm       43 - 52  LV ID, ES, PLAX chordal                  29    mm       23  - 38  LV fx shortening, PLAX chordal           41    %        >=29  LV PW thickness, ED                      10    mm       ---------  IVS/LV PW ratio, ED                      0.8            <=1.3  Stroke volume, 2D                        81    ml       ---------  Stroke volume/bsa, 2D                    35    ml/m^2   ---------  LV e&', lateral                           10.7  cm/s     ---------  LV E/e&', lateral                         3.55           ---------  LV e&', medial                            5.5   cm/s     ---------  LV E/e&', medial                          6.91           ---------  LV e&', average                           8.1   cm/s     ---------  LV E/e&', average                         4.69           ---------    Ventricular septum                       Value          Reference  IVS thickness, ED                        8     mm       ---------    LVOT  Value          Reference  LVOT ID, S                               20    mm       ---------  LVOT area                                3.14  cm^2     ---------  LVOT peak velocity, S                    108   cm/s     ---------  LVOT mean velocity, S                    65    cm/s     ---------  LVOT VTI, S                              25.7  cm       ---------    Aortic valve                             Value          Reference  Aortic regurg pressure half-time         605   ms       ---------    Aorta                                    Value          Reference  Aortic root ID, ED                       41    mm       ---------    Left atrium                              Value          Reference  LA ID, A-P, ES                           44    mm       ---------  LA ID/bsa, A-P                           1.9   cm/m^2   <=2.2  LA volume, S                             46.1  ml       ---------  LA volume/bsa, S                         19.9  ml/m^2   ---------  LA volume, ES, 1-p A4C                    38.7  ml       ---------  LA volume/bsa, ES, 1-p A4C               16.7  ml/m^2   ---------  LA volume, ES, 1-p A2C                   55.5  ml       ---------  LA volume/bsa, ES, 1-p A2C               23.9  ml/m^2   ---------    Mitral valve                             Value          Reference  Mitral E-wave peak velocity              38    cm/s     ---------  Mitral A-wave peak velocity              40.1  cm/s     ---------  Mitral deceleration time         (H)     296   ms       150 - 230  Mitral pressure half-time                87    ms       ---------  Mitral E/A ratio, peak                   0.9            ---------  Mitral valve area, PHT, DP               2.53  cm^2     ---------  Mitral valve area/bsa, PHT, DP           1.09  cm^2/m^2 ---------    Pulmonary arteries                       Value          Reference  PA pressure, S, DP                       29    mm Hg    <=30    Tricuspid valve                          Value          Reference  Tricuspid regurg peak velocity           228   cm/s     ---------  Tricuspid peak RV-RA gradient            21    mm Hg    ---------    Systemic veins                           Value          Reference  Estimated CVP                            8     mm Hg    ---------    Right ventricle  Value          Reference  TAPSE                                    17    mm       ---------  RV pressure, S, DP                       29    mm Hg    <=30  RV s&', lateral, S                        7.01  cm/s     ---------  Legend: (L)  and  (H)  mark values outside specified reference range.  ------------------------------------------------------------------- Prepared and Electronically Authenticated by  Jenkins Rouge, M.D. 2019-09-03T08:15:41                        Assessment / Plan:   Stable appearance of CTA of chest abdomen pelvis following type I aortic dissection October 2018  Patient remains stable with  good blood pressure control since his acute aortic dissection repair in October 2018.  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 Scot Dock with Doppler study to evaluate popliteal aneurysm.  Patient will contact cardiology office for yearly follow-up with Dr. Frances Nickels  Plan to see the patient back in 1 year with a CTA of the chest  [name] MD      [address] [place] [address] Office [phone]   Beeper [phone]  05/03/2019 3:07 PM

## 2019-05-11 DIAGNOSIS — R69 Illness, unspecified: Secondary | ICD-10-CM | POA: Diagnosis not present

## 2019-05-12 ENCOUNTER — Other Ambulatory Visit: Payer: Self-pay | Admitting: Family Medicine

## 2019-05-12 NOTE — Telephone Encounter (Signed)
Routing to PCP for approval on Celexa

## 2019-05-13 NOTE — Telephone Encounter (Signed)
Refill Celexa for one year. 

## 2019-05-14 ENCOUNTER — Other Ambulatory Visit: Payer: Self-pay | Admitting: *Deleted

## 2019-05-15 DIAGNOSIS — R69 Illness, unspecified: Secondary | ICD-10-CM | POA: Diagnosis not present

## 2019-06-02 ENCOUNTER — Other Ambulatory Visit: Payer: Self-pay | Admitting: Nurse Practitioner

## 2019-06-04 ENCOUNTER — Other Ambulatory Visit: Payer: Self-pay

## 2019-06-04 DIAGNOSIS — I724 Aneurysm of artery of lower extremity: Secondary | ICD-10-CM

## 2019-06-05 ENCOUNTER — Telehealth (HOSPITAL_COMMUNITY): Payer: Self-pay

## 2019-06-05 ENCOUNTER — Other Ambulatory Visit: Payer: Self-pay | Admitting: Cardiovascular Disease

## 2019-06-05 MED ORDER — METOPROLOL TARTRATE 25 MG PO TABS: ORAL_TABLET | ORAL | 1 refills | Status: DC

## 2019-06-05 NOTE — Telephone Encounter (Signed)

## 2019-06-05 NOTE — Telephone Encounter (Signed)
Pt's medication was sent to pt's pharmacy as requested. Confirmation received.  °

## 2019-06-06 ENCOUNTER — Ambulatory Visit (INDEPENDENT_AMBULATORY_CARE_PROVIDER_SITE_OTHER): Payer: Medicare HMO | Admitting: Vascular Surgery

## 2019-06-06 ENCOUNTER — Ambulatory Visit (HOSPITAL_COMMUNITY)
Admission: RE | Admit: 2019-06-06 | Discharge: 2019-06-06 | Disposition: A | Discharge status: Home / Self Care | Payer: Medicare HMO | Source: Ambulatory Visit | Attending: Family | Admitting: Family

## 2019-06-06 ENCOUNTER — Encounter: Payer: Self-pay | Admitting: Vascular Surgery

## 2019-06-06 ENCOUNTER — Other Ambulatory Visit: Payer: Self-pay

## 2019-06-06 VITALS — BP 128/68 | HR 58 | Temp 97.3°F | Resp 20 | Ht 68.0 in | Wt 183.0 lb

## 2019-06-06 DIAGNOSIS — I724 Aneurysm of artery of lower extremity: Secondary | ICD-10-CM | POA: Diagnosis not present

## 2019-06-06 NOTE — Progress Notes (Signed)
Patient name: Don Medina MRN: [id number] DOB: Dec 28, 1940 Sex: male  REASON FOR VISIT:   Follow-up of right popliteal artery aneurysm  HPI:   Don Medina is a pleasant 78 y.o. 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  The patient is status post repair of an aortic dissection.  The patient had some chronic dissection extending down the infrarenal aorta and into the common iliac artery.  His right popliteal artery measured 1.6 cm in maximum diameter.  I felt that this was fairly small and given his age we would not recommend the elective repair of this unless this enlarge significantly or became symptomatic.  I last saw him in person in the office in February of this year.  At that time the maximum diameter of the right popliteal artery was 2 cm.  The left popliteal artery measured 1.5 cm.  I recommended a follow-up CT angiogram to further evaluate his popliteal arteries bilaterally and also his abdominal aorta and iliac arteries given his dissection.  On 11/01/2018 I discussed the results of his CT angiogram with the patient on the phone.  The abdominal aorta measured 2.5 cm in maximum diameter.  The right common iliac artery measured 1.9 cm in maximum diameter.  The left common iliac artery measures 1.8 cm in maximum diameter.  The right popliteal artery measured 2 cm in maximum diameter.  This is what it measured back in February by duplex.  There did not appear to be significant laminated thrombus.  The left popliteal artery measured 1.2 cm in maximum diameter.  I recommended a follow-up duplex in 6 months and he comes in today for that visit.  Since I saw him last he denies any claudication, rest pain, or nonhealing ulcers.  He is maintained on chronic Coumadin therapy.  He is followed closely by Dr. Servando Snare who repaired his aortic dissection.  Past Medical History:  Diagnosis Date  . ADJ  DISORDER WITH MIXED ANXIETY & DEPRESSED MOOD 09/08/2009   Qualifier: Diagnosis of  By: Elease Hashimoto MD, Bruce    . Arthritis   . Atrial fibrillation ([place]) [I48.91] 05/23/2017  . Blood in the stool   . BRADYCARDIA 03/10/2010   Qualifier: Diagnosis of  By: Elease Hashimoto MD, Bruce    . CHEST PAIN 03/19/2010   Qualifier: Diagnosis of  By: Allean Found, LPN, Christine    . Chicken pox   . COLONIC POLYPS, HX OF 01/13/2009   Qualifier: Diagnosis of  By: Valma Cava LPN, Izora Gala    . Episode of generalized weakness 02/17/2016  . GERD (gastroesophageal reflux disease) 10/03/2012  . Hay fever   . History of colonic polyps   . History of colonoscopy   . Hyperlipidemia   . MUSCLE STRAIN, HAMSTRING MUSCLE 09/08/2009   Qualifier: Diagnosis of  By: Elease Hashimoto MD, Bruce    . ORTHOSTATIC DIZZINESS 03/05/2010   Qualifier: Diagnosis of  By: Elease Hashimoto MD, Bruce    . Other premature beats 03/17/2010   Qualifier: Diagnosis of  By: Lovena Le, MD, Martyn Malay   . Premature ventricular contraction 01/05/2011  . S/P aortic dissection repair 05/08/2017  . THROMBOCYTOPENIA 03/05/2010   Qualifier: Diagnosis of  By: Elease Hashimoto MD, Bruce    . Weakness 12/09/2015    Family History  Problem Relation Age of Onset  . Alzheimer's disease Father 11  . Dementia Father   . Stroke Mother 11  . Cancer Brother  Leukemia  . Sudden death Other        family  . Hypertension Other   . Heart disease Neg Hx     SOCIAL HISTORY: Social History   Tobacco Use  . Smoking status: Former Smoker    Quit date: 08/09/1974    Years since quitting: 44.8  . Smokeless tobacco: Never Used  Substance Use Topics  . Alcohol use: No    Allergies  Allergen Reactions  . 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 Quinolones     Patient was warned about not using Cipro and similar antibiotics. Recent studies have raised concern that fluoroquinolone antibiotics could be associated with an increased risk of aortic aneurysm  Fluoroquinolones have non-antimicrobial properties that might jeopardise the integrity of the extracellular matrix of the vascular wall In a  propensity score matched cohort study in [place], there was a 66% increased rate of aortic aneurysm or dissection associated with oral fluoroquinolone use, compared wit    Current Outpatient Medications  Medication Sig Dispense Refill  . AMBULATORY NON FORMULARY MEDICATION Medication Name: Nitroglycerine ointment 0.125 %  Apply a pea sized amount internally 2 to 3 times daily for 2 weeks Dispense 30 GM zero refill 30 g 0  . aspirin 81 MG tablet Take 1 tablet (81 mg total) by mouth daily. 30 tablet   . citalopram (CELEXA) 20 MG tablet Take 1 tablet by mouth once daily 90 tablet 3  . metoprolol tartrate (LOPRESSOR) 25 MG tablet TAKE 1/2 (ONE-HALF) TABLET BY MOUTH TWICE DAILY 90 tablet 1  . Misc Natural Products (GLUCOS-CHONDROIT-MSM COMPLEX) TABS Take 1 tablet by mouth 3 (three) times daily.    . Omega-3 Fatty Acids (FISH OIL) 1000 MG CAPS Take 1 capsule by mouth daily.    . simvastatin (ZOCOR) 20 MG tablet TAKE 1 TABLET BY MOUTH AT BEDTIME 90 tablet 0  . warfarin (COUMADIN) 2.5 MG tablet Take 2 tablets daily except take 3 tablets on Tuesdays and Sat or Take as directed per coumadin clinic 200 tablet 1   No current facility-administered medications for this visit.     REVIEW OF SYSTEMS:  [X]  denotes positive finding, [ ]  denotes negative finding Cardiac  Comments:  Chest pain or chest pressure:    Shortness of breath upon exertion:    Short of breath when lying flat:    Irregular heart rhythm:        Vascular    Pain in calf, thigh, or hip brought on by ambulation:    Pain in feet at night that wakes you up from your sleep:     Blood clot in your veins:    Leg swelling:         Pulmonary    Oxygen at home:    Productive cough:     Wheezing:         Neurologic    Sudden weakness in arms or legs:     Sudden numbness in arms or legs:      Sudden onset of difficulty speaking or slurred speech:    Temporary loss of vision in one eye:     Problems with dizziness:         Gastrointestinal    Blood in stool:     Vomited blood:         Genitourinary    Burning when urinating:     Blood in urine:        Psychiatric  Major depression:         Hematologic    Bleeding problems:    Problems with blood clotting too easily:        Skin    Rashes or ulcers:        Constitutional    Fever or chills:     PHYSICAL EXAM:   Vitals:   06/06/19 1528  BP: 128/68  Pulse: (!) 58  Resp: 20  Temp: (!) 97.3 F (36.3 C)  SpO2: 97%  Weight: 183 lb (83 kg)  Height: 5\' 8"  (1.727 m)    GENERAL: The patient is a well-nourished male, in no acute distress. The vital signs are documented above. CARDIAC: There is a regular rate and rhythm.  VASCULAR: I do not detect carotid bruits. He has palpable femoral, popliteal, and posterior tibial pulses bilaterally. PULMONARY: There is good air exchange bilaterally without wheezing or rales. ABDOMEN: Soft and non-tender with normal pitched bowel sounds.  MUSCULOSKELETAL: There are no major deformities or cyanosis. NEUROLOGIC: No focal weakness or paresthesias are detected. SKIN: There are no ulcers or rashes noted. PSYCHIATRIC: The patient has a normal affect.  DATA:    DUPLEX: I have independently interpreted his duplex of both popliteal arteries today.  On the right side the maximum diameter is 2.2 cm which is increased slightly from 2.0 cm in February.  On the left side the maximum diameter is 1.5 cm which is stable.  MEDICAL ISSUES:   RIGHT POPLITEAL ARTERY ANEURYSM: This patient has generalized ectasia however he has a right popliteal artery aneurysm which I have been following.  This is 2.2 cm and by CT scan did not have a significant amount of laminated thrombus.  Given his age we are trying to avoid repair.  As the aneurysm has not enlarged significantly since February I  think we can continue with follow-up and I have ordered a follow-up duplex scan in 6 months.  I will see him back at that time.  He knows to call sooner if he has problems.  Deitra Mayo [facility] [phone]

## 2019-06-11 ENCOUNTER — Other Ambulatory Visit: Payer: Self-pay

## 2019-06-11 ENCOUNTER — Ambulatory Visit (INDEPENDENT_AMBULATORY_CARE_PROVIDER_SITE_OTHER): Payer: Medicare HMO | Admitting: General Practice

## 2019-06-11 DIAGNOSIS — Z7901 Long term (current) use of anticoagulants: Secondary | ICD-10-CM | POA: Diagnosis not present

## 2019-06-11 DIAGNOSIS — Z9889 Other specified postprocedural states: Secondary | ICD-10-CM

## 2019-06-11 LAB — POCT INR: INR: 2.6 (ref 2.0–3.0)

## 2019-06-11 NOTE — Patient Instructions (Signed)
Pre visit review using our clinic review tool, if applicable. No additional management support is needed unless otherwise documented below in the visit note.  Continue taking 5mg daily except 7.5mg on Tuesdays and Saturdays. Recheck in 6 weeks. 

## 2019-06-13 DIAGNOSIS — R69 Illness, unspecified: Secondary | ICD-10-CM | POA: Diagnosis not present

## 2019-06-18 DIAGNOSIS — L570 Actinic keratosis: Secondary | ICD-10-CM | POA: Diagnosis not present

## 2019-06-18 DIAGNOSIS — L821 Other seborrheic keratosis: Secondary | ICD-10-CM | POA: Diagnosis not present

## 2019-06-18 DIAGNOSIS — D485 Neoplasm of uncertain behavior of skin: Secondary | ICD-10-CM | POA: Diagnosis not present

## 2019-07-08 IMAGING — DX DG CHEST 2V
2 series · 2 of 2 positions shown · non-contrast
Comparison: Radiograph 12/04/2015

CLINICAL DATA: Central chest pain.

EXAM:
CHEST  2 VIEW

[chest lat]
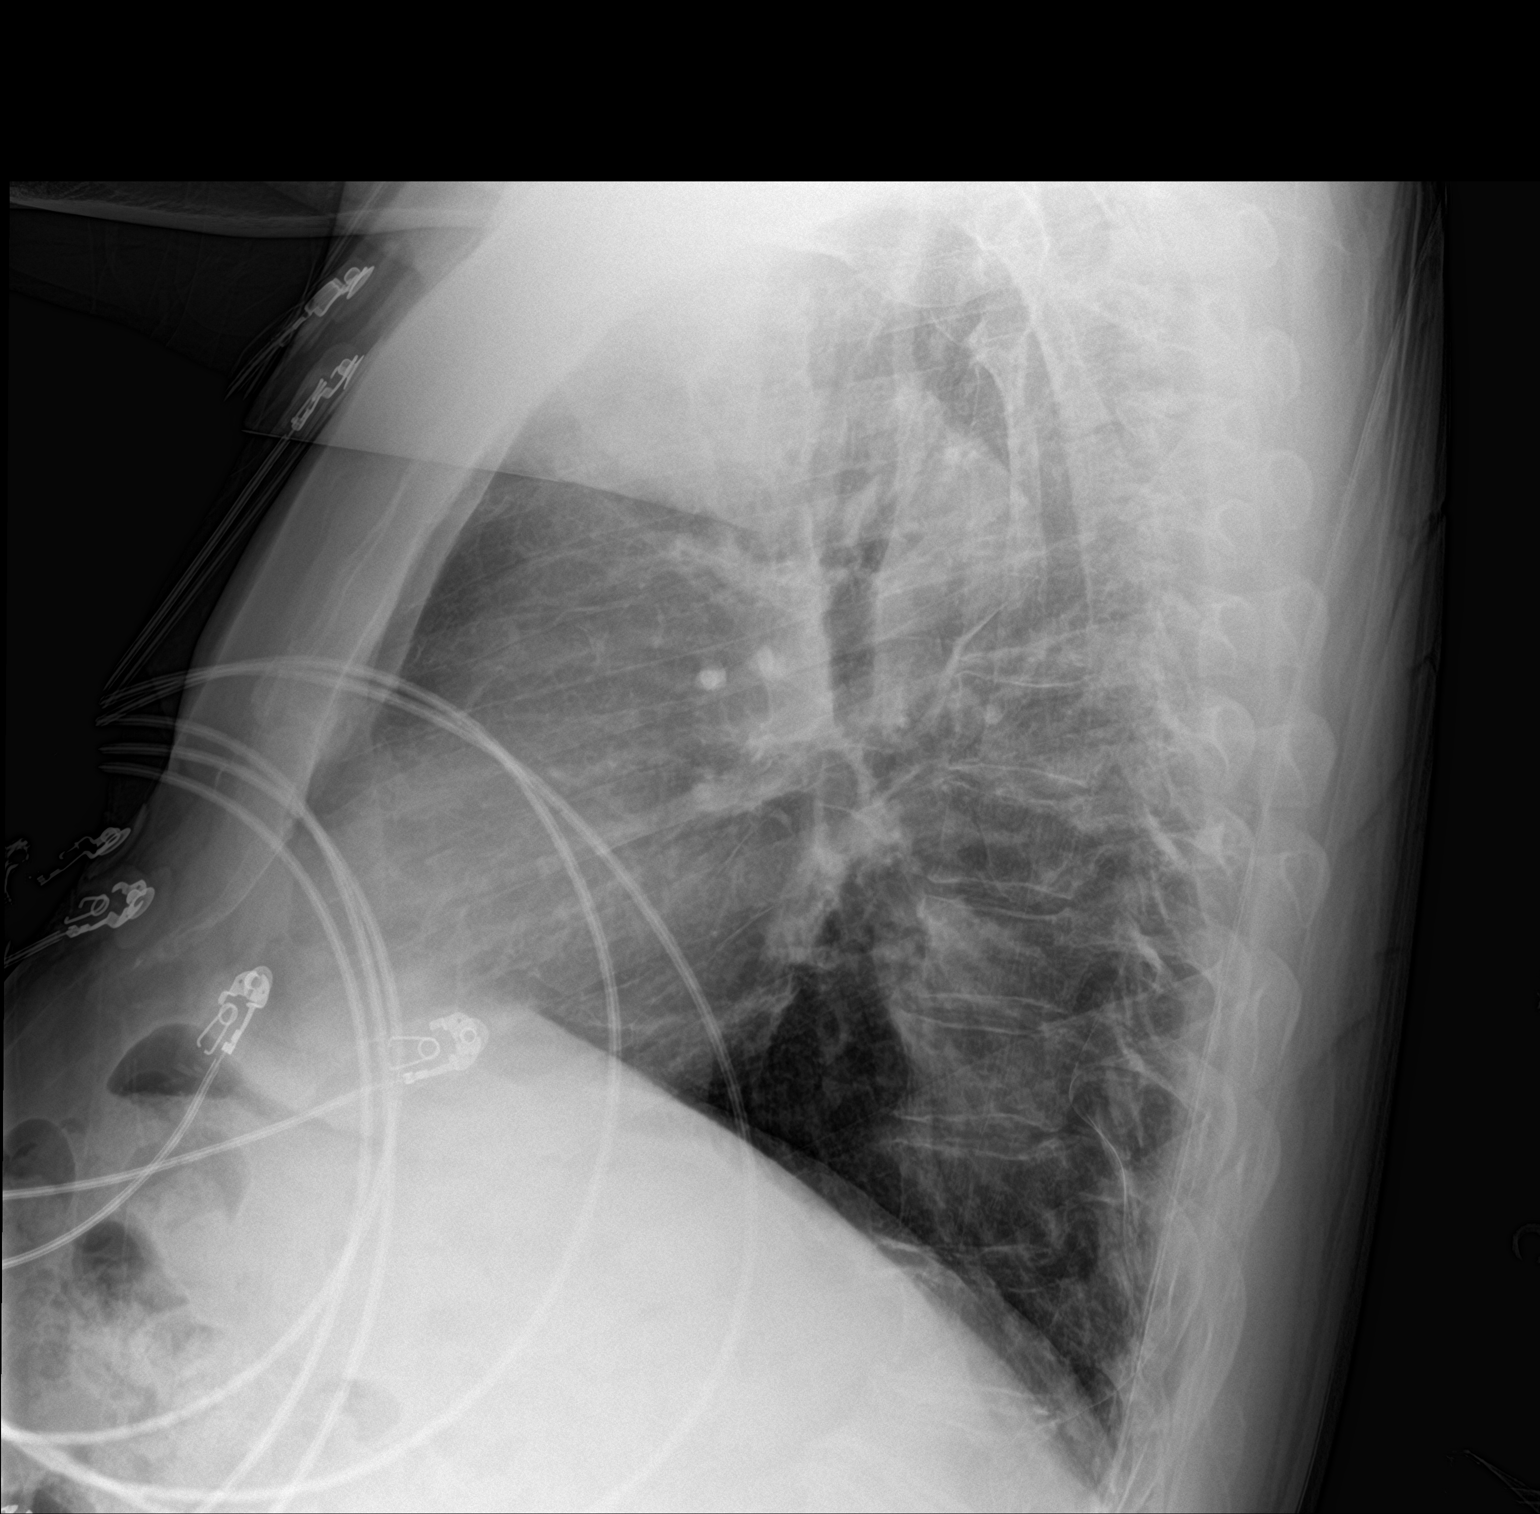

[chest ap]
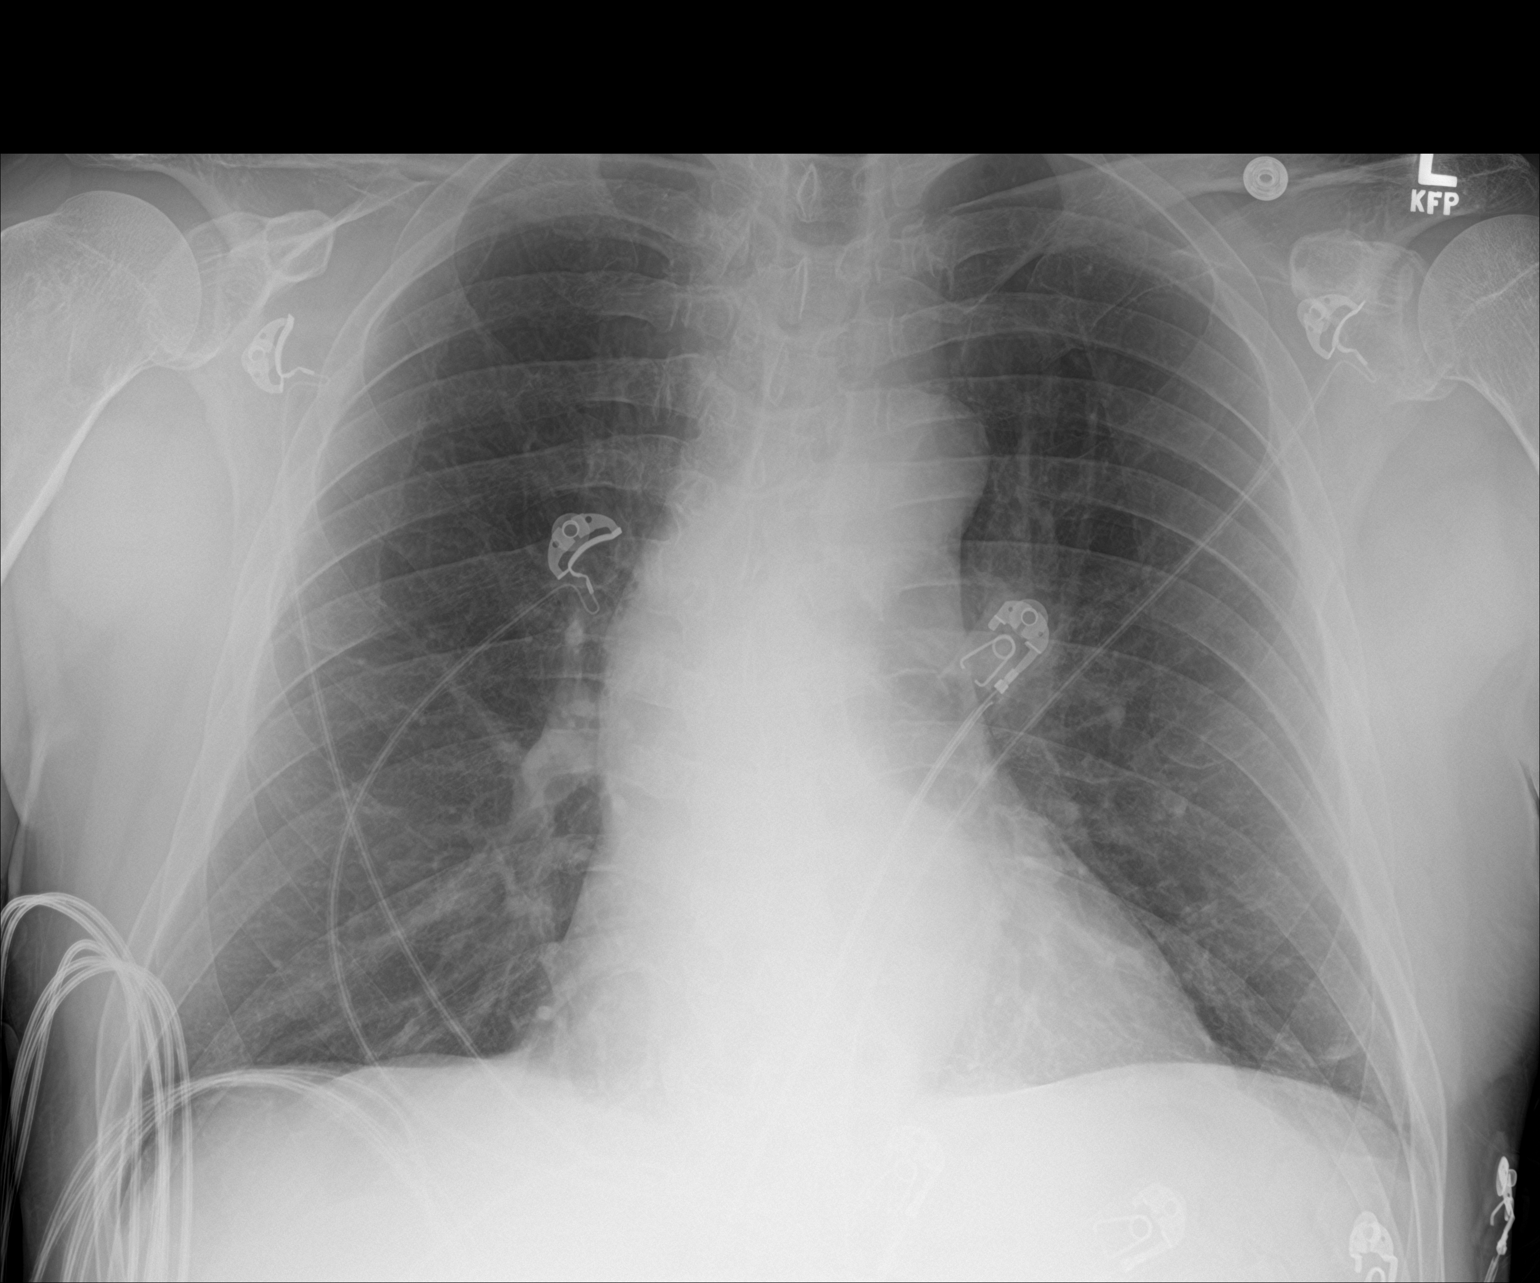

[2 of 2 positions shown; findings below may reference images not displayed]

FINDINGS: Mild chronic hyperinflation. The cardiomediastinal contours are
unchanged with stable tortuosity of the thoracic aorta. The lungs
are clear. Pulmonary vasculature is normal. No consolidation,
pleural effusion, or pneumothorax. No acute osseous abnormalities
are seen.
IMPRESSION: Mild chronic hyperinflation without acute abnormality.

## 2019-07-09 IMAGING — CT CT ANGIO CHEST-ABD-PELV FOR DISSECTION W/ AND WO/W CM
2 of 7 series · 13 of 46 positions shown, 15 images · IV contrast (OMNI 350)
Comparison: 12/04/2015 chest radiograph.

ADDENDUM:
These results were called by telephone at the time of interpretation
on 05/08/2017 at [DATE] to Dr. Mcclintock, who verbally acknowledged
these results.

By: Jalmar Fackyea M.D.
CLINICAL DATA: 76 y/o  M; chest pain.
EXAM:
CT ANGIOGRAPHY CHEST, ABDOMEN AND PELVIS
TECHNIQUE: Multidetector CT imaging through the chest, abdomen and pelvis was
performed using the standard protocol during bolus administration of
intravenous contrast. Multiplanar reconstructed images and MIPs were
obtained and reviewed to evaluate the vascular anatomy.
CONTRAST:  90 cc Isovue 370

[Series 8: dissection 2mm · axial · 0.80mm/px · z∈[+720,+1292]mm · 10 of 322 slices shown, 12 images]
[im 18/322  soft-tissue]
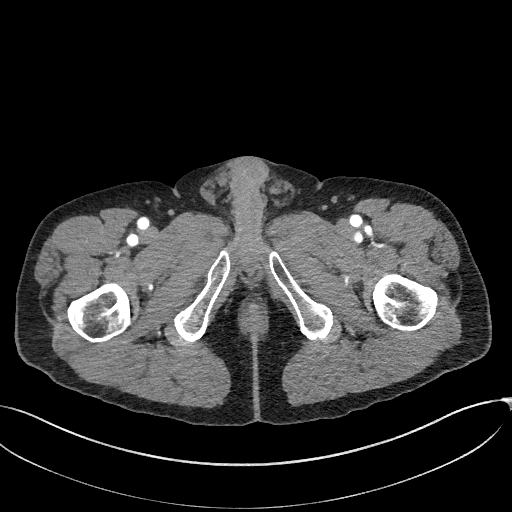
[im 18/322  bone]
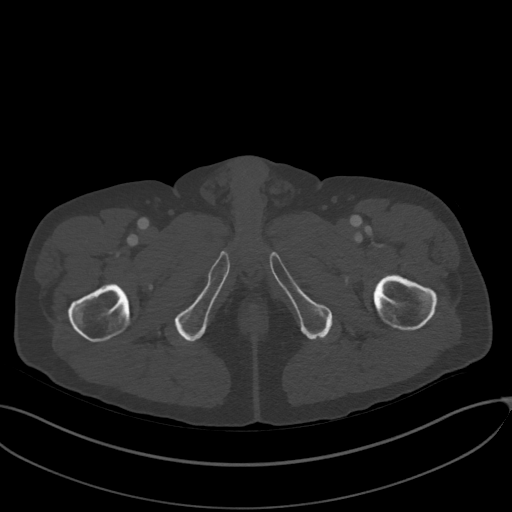
[im 54/322  soft-tissue]
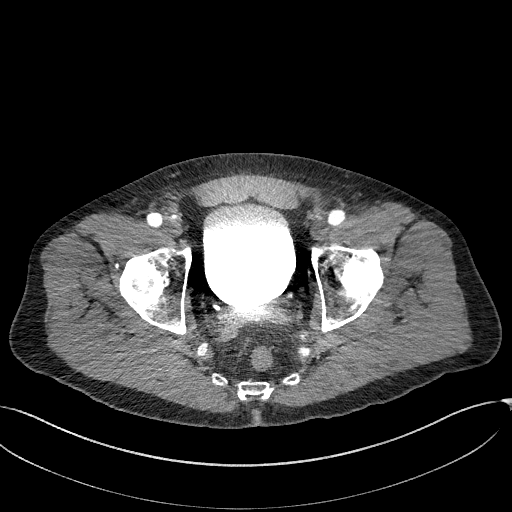
[im 90/322  soft-tissue]
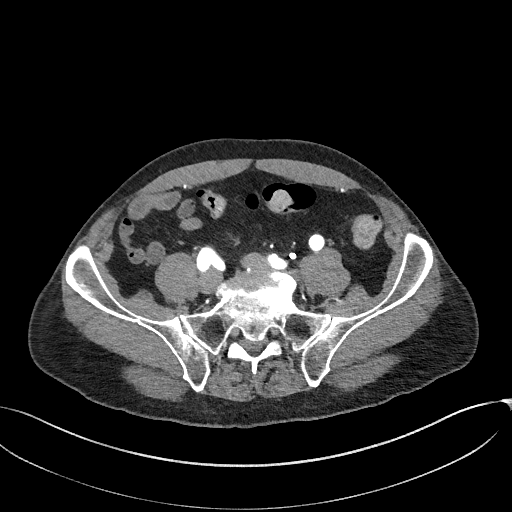
[im 108/322  soft-tissue]
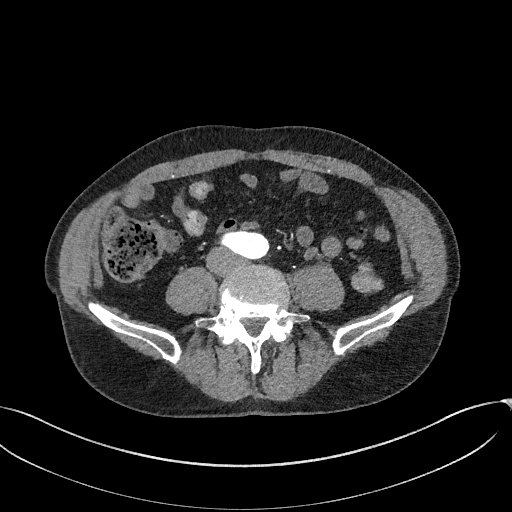
[im 143/322  soft-tissue]
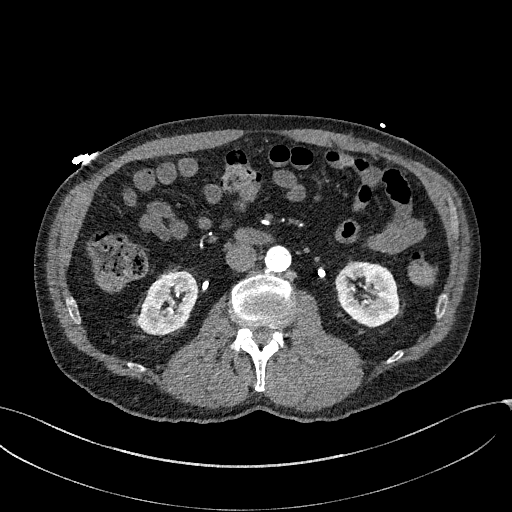
[im 179/322  soft-tissue]
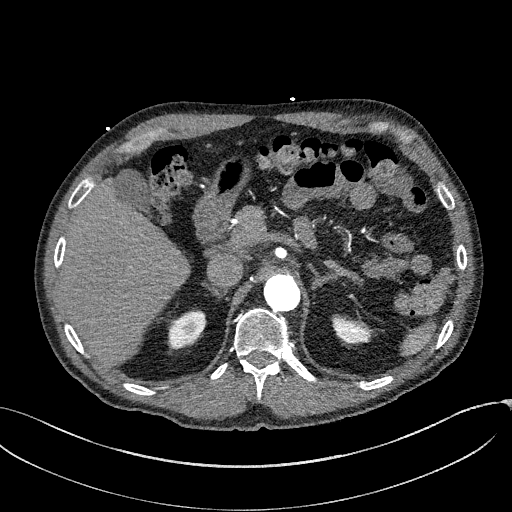
[im 215/322  soft-tissue]
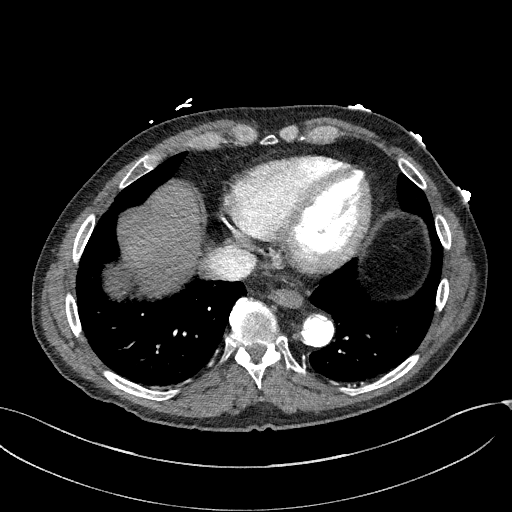
[im 232/322  soft-tissue]
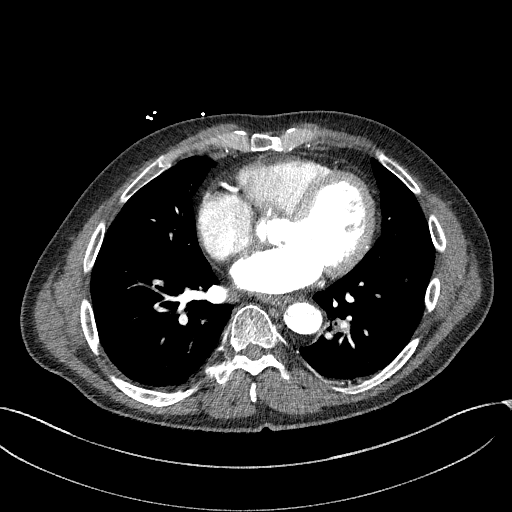
[im 268/322  soft-tissue]
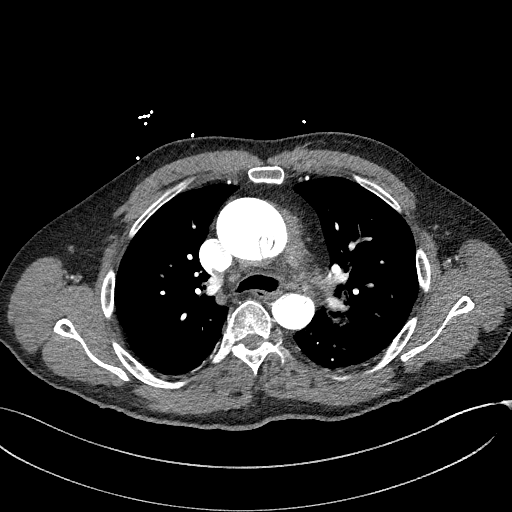
[im 268/322  bone]
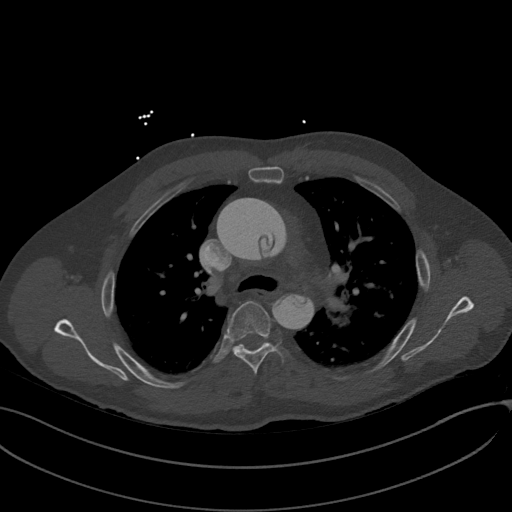
[im 304/322  soft-tissue]
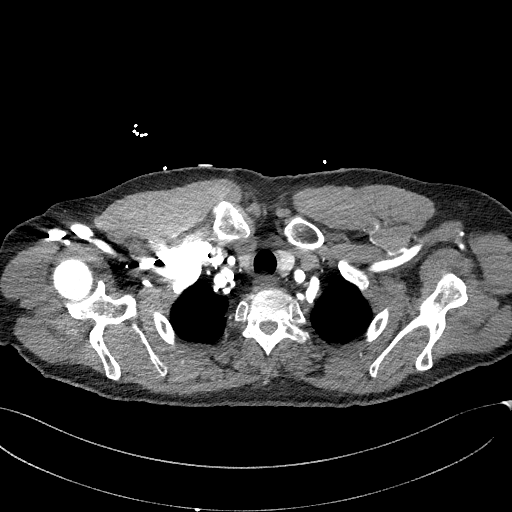

[Series 11: dissection 2mm cor · coronal · 0.79mm/px · 3 of 157 slices shown]
[im 40/157  soft-tissue]
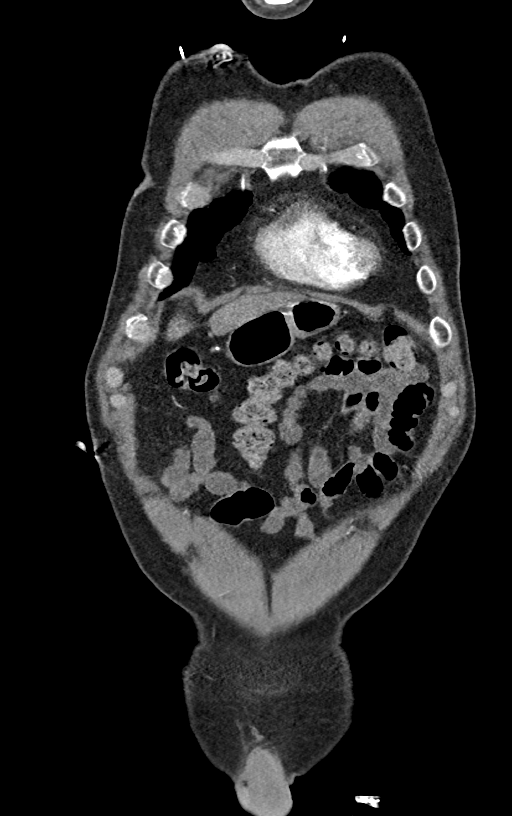
[im 79/157  soft-tissue]
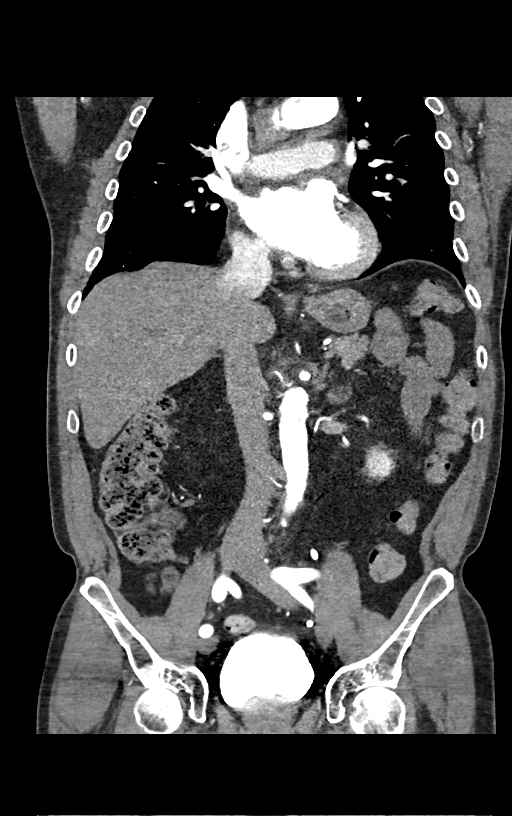
[im 118/157  soft-tissue]
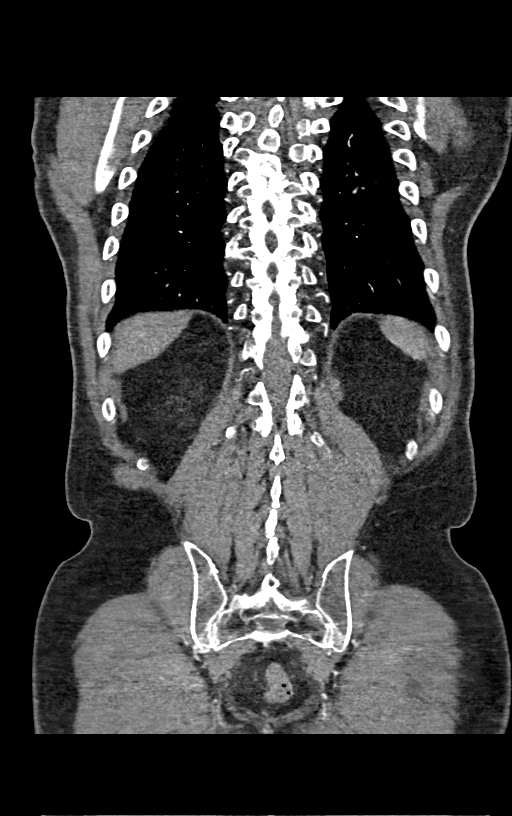

[13 of 46 positions shown; findings below may reference images not displayed]

FINDINGS: CTA CHEST FINDINGS

Cardiovascular: Normal heart size. No pericardial effusion. Normal
size main pulmonary artery. Anomalous retroaortic circumflex
coronary artery with origin from the right coronary sinus.

Type A aortic dissection extending from the aortic root to the
aortic bifurcation extending into bilateral common iliac artery
origins. There are multiple fenestrations within the dissection flap
with similar enhancement of both true and false lumens. No thrombus
is identified. The aortic root origin of the dissection flap
approaches the margins of the right and left coronary sinuses but
does not extend into the coronary arteries. Dissection flap extends
into right brachiocephalic artery, left common carotid artery, and
left subclavian artery. The left common carotid artery dissection
extends above the field of view into the neck.

Ascending aortic aneurysm measuring up to 5.3 cm.

Mediastinum/Nodes: No enlarged mediastinal, hilar, or axillary lymph
nodes. Thyroid gland, trachea, and esophagus demonstrate no
significant findings.

Lungs/Pleura: Lungs are clear. No pleural effusion or pneumothorax.
Stable 4 mm pleural nodule in the right upper lobe.

Musculoskeletal: No chest wall abnormality. No acute or significant
osseous findings.

Review of the MIP images confirms the above findings.

CTA ABDOMEN AND PELVIS FINDINGS

VASCULAR

There are multiple fenestrations within the dissection flap in the
thoracic aorta, assessing true/false lumen is difficult due to
similar enhancement between the lumens. The lumen originating both
coronary arteries is slightly increased in attenuation as well as
larger and will be assumed to be the true lumen.

Aorta: Dissection flap from aortic root to aortic bifurcation. No
abdominal aortic aneurysm. No clot identified.

Celiac: Patent, originating from true lumen.

SMA: Patent, originating from false lumen.

Renals: Patent bilaterally, the left arising from the true lumen and
the right arising from false lumen.

IMA: Patent, originating from false lumen.

Inflow: Patent without evidence of aneurysm, dissection, vasculitis
or significant stenosis.

Veins: No obvious venous abnormality within the limitations of this
arterial phase study.

Review of the MIP images confirms the above findings.

NON-VASCULAR

Hepatobiliary: No focal liver abnormality is seen. No gallstones,
gallbladder wall thickening, or biliary dilatation.

Pancreas: Unremarkable. No pancreatic ductal dilatation or
surrounding inflammatory changes.

Spleen: Normal in size without focal abnormality.

Adrenals/Urinary Tract: Adrenal glands are unremarkable. Kidneys are
normal, without renal calculi, focal lesion, or hydronephrosis.
Bladder is unremarkable.

Stomach/Bowel: Stomach is within normal limits. Appendix appears
normal. No evidence of bowel wall thickening, distention, or
inflammatory changes.

Lymphatic: No lymphadenopathy.

Reproductive: Prostate is unremarkable.

Other: No abdominal wall hernia or abnormality. No abdominopelvic
ascites.

Musculoskeletal: Multilevel degenerative changes greatest at L5-S1
where there is moderate disc space narrowing. Mild stable L1
compression deformity.

Review of the MIP images confirms the above findings.
IMPRESSION: 1. Complex type A aortic dissection extending from aortic root to
aortic bifurcation.
2. The dissection flap approaches the margin of coronary sinuses but
does not extend into the coronary arteries.
3. Aberrant retroaortic circumflex coronary artery originating from
right coronary sinus.
4. Dissection flap extends into proximal right brachiocephalic
artery and left subclavian artery.
5. Dissection flap extends into left common carotid artery above the
field of view into the neck.
6. Dissection flap extends into proximal common iliac arteries
bilaterally.
7. Large abdominal branches arise from both false and true lumens as
described above, no dissection is identified extending into the
abdominal artery origins.
8. Ascending aortic aneurysm measuring up to 5.3 cm.

By: Jalmar Fackyea M.D.

## 2019-07-12 DIAGNOSIS — Z0100 Encounter for examination of eyes and vision without abnormal findings: Secondary | ICD-10-CM | POA: Diagnosis not present

## 2019-07-12 DIAGNOSIS — Z961 Presence of intraocular lens: Secondary | ICD-10-CM | POA: Diagnosis not present

## 2019-07-23 ENCOUNTER — Other Ambulatory Visit: Payer: Self-pay

## 2019-07-23 ENCOUNTER — Ambulatory Visit (INDEPENDENT_AMBULATORY_CARE_PROVIDER_SITE_OTHER): Payer: Medicare HMO | Admitting: General Practice

## 2019-07-23 DIAGNOSIS — Z7901 Long term (current) use of anticoagulants: Secondary | ICD-10-CM | POA: Diagnosis not present

## 2019-07-23 DIAGNOSIS — Z9889 Other specified postprocedural states: Secondary | ICD-10-CM

## 2019-07-23 LAB — POCT INR: INR: 2.2 (ref 2.0–3.0)

## 2019-07-23 NOTE — Patient Instructions (Addendum)
Pre visit review using our clinic review tool, if applicable. No additional management support is needed unless otherwise documented below in the visit note.  Continue taking 5mg daily except 7.5mg on Tuesdays and Saturdays. Recheck in 6 weeks. 

## 2019-08-01 ENCOUNTER — Ambulatory Visit: Payer: Medicare HMO | Attending: Internal Medicine

## 2019-08-01 DIAGNOSIS — Z20822 Contact with and (suspected) exposure to covid-19: Secondary | ICD-10-CM

## 2019-08-01 DIAGNOSIS — Z20828 Contact with and (suspected) exposure to other viral communicable diseases: Secondary | ICD-10-CM | POA: Diagnosis not present

## 2019-08-03 LAB — NOVEL CORONAVIRUS, NAA: SARS-CoV-2, NAA: NOT DETECTED

## 2019-08-09 ENCOUNTER — Other Ambulatory Visit: Payer: Self-pay | Admitting: Family Medicine

## 2019-08-13 ENCOUNTER — Encounter: Payer: Self-pay | Admitting: Cardiothoracic Surgery

## 2019-08-13 ENCOUNTER — Encounter: Payer: Self-pay | Admitting: Family Medicine

## 2019-08-17 ENCOUNTER — Encounter: Payer: Self-pay | Admitting: Family Medicine

## 2019-08-19 ENCOUNTER — Ambulatory Visit: Payer: Medicare Other | Attending: Internal Medicine

## 2019-08-19 DIAGNOSIS — Z23 Encounter for immunization: Secondary | ICD-10-CM

## 2019-08-19 NOTE — Progress Notes (Signed)
   Covid-19 Vaccination Clinic  Name:  Don Medina    MRN: [id number] DOB: [birth year]  08/19/2019  [name] was observed post Covid-19 immunization for 15 minutes without incidence. He was provided with Vaccine Information Sheet and instruction to access the V-Safe system.   [name] was instructed to call 911 with any severe reactions post vaccine: [name] Kitchen Difficulty breathing  . Swelling of your face and throat  . A fast heartbeat  . A bad rash all over your body  . Dizziness and weakness    Immunizations Administered    Name Date Dose VIS Date Route   Pfizer COVID-19 Vaccine 08/19/2019 10:35 AM 0.3 mL 07/20/2019 Intramuscular   Manufacturer: [employer], [employer]   Lot: [id number]   [place]: [id number]

## 2019-08-22 ENCOUNTER — Other Ambulatory Visit: Payer: Self-pay | Admitting: General Practice

## 2019-08-22 ENCOUNTER — Encounter: Payer: Self-pay | Admitting: Family Medicine

## 2019-08-22 DIAGNOSIS — I480 Paroxysmal atrial fibrillation: Secondary | ICD-10-CM

## 2019-08-22 DIAGNOSIS — Z7901 Long term (current) use of anticoagulants: Secondary | ICD-10-CM

## 2019-08-22 MED ORDER — WARFARIN SODIUM 2.5 MG PO TABS: ORAL_TABLET | ORAL | 1 refills | Status: DC

## 2019-08-31 ENCOUNTER — Other Ambulatory Visit: Payer: Self-pay | Admitting: Family Medicine

## 2019-09-03 ENCOUNTER — Ambulatory Visit (INDEPENDENT_AMBULATORY_CARE_PROVIDER_SITE_OTHER): Payer: Medicare HMO | Admitting: General Practice

## 2019-09-03 ENCOUNTER — Other Ambulatory Visit: Payer: Self-pay

## 2019-09-03 DIAGNOSIS — Z7901 Long term (current) use of anticoagulants: Secondary | ICD-10-CM | POA: Diagnosis not present

## 2019-09-03 LAB — POCT INR: INR: 2.4 (ref 2.0–3.0)

## 2019-09-03 NOTE — Patient Instructions (Addendum)
Pre visit review using our clinic review tool, if applicable. No additional management support is needed unless otherwise documented below in the visit note.  Continue taking 5mg daily except 7.5mg on Tuesdays and Saturdays. Recheck in 6 weeks. 

## 2019-09-06 ENCOUNTER — Ambulatory Visit: Payer: Medicare Other

## 2019-09-08 ENCOUNTER — Ambulatory Visit: Payer: Medicare Other

## 2019-09-25 ENCOUNTER — Ambulatory Visit: Payer: Medicare Other

## 2019-10-09 DIAGNOSIS — W5501XA Bitten by cat, initial encounter: Secondary | ICD-10-CM | POA: Diagnosis not present

## 2019-10-09 DIAGNOSIS — S61451A Open bite of right hand, initial encounter: Secondary | ICD-10-CM | POA: Diagnosis not present

## 2019-10-10 ENCOUNTER — Ambulatory Visit: Payer: Medicare HMO | Admitting: Cardiovascular Disease

## 2019-10-10 ENCOUNTER — Other Ambulatory Visit (HOSPITAL_COMMUNITY): Payer: Medicare HMO

## 2019-10-15 ENCOUNTER — Ambulatory Visit: Payer: Medicare Other

## 2019-11-22 NOTE — Progress Notes (Signed)
CARDIOLOGY OFFICE NOTE  Date:  11/29/2019    Don Medina Date of Birth: [birth year] Medical Record [id number]  PCP:  Eulas Post, MD  Cardiologist:  Gillian Shields   No chief complaint on file.   History of Present Illness: Don Medina is a 79 y.o. male seen f/u  for acute type A aortic dissection.   He has a history of HLD and PVCs. No history of CAD, heart attack, HTN, HLD or DM. No prior tobacco use. Had new onset chest and back pain September 2018 with dissection    He was taken to the OR by Dr. Servando Snare 05/08/17 he underwent grafting of the ascending aorta above the sinus with resuspension of AV  Right innominate tied off. Balloon stent graft extending past left subclavian with graft to graft in ascending aorta bifucated to innominate and carotid and graft to graft left subclavian. 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   Echo 05/08/18 EF 55-60% mild AR  Cardiac CTA 06/09/17 with no significant CAD stable repair   Had right popliteal aneurysm Last duplex 06/06/19  2.2 cm previous 2.0 cm Followed by Dr Doren Custard   Has had vaccine Daughter in [place] has liver failure from ETOH   Past Medical History:  Diagnosis Date  . 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 Arthritis   . Atrial fibrillation ([place]) [I48.91] 05/23/2017  . Blood in the stool   . BRADYCARDIA 03/10/2010   Qualifier: Diagnosis of  By: Elease Hashimoto MD, Bruce    . CHEST PAIN 03/19/2010   Qualifier: Diagnosis of  By: Allean Found, LPN, Christine    . Chicken pox   . COLONIC POLYPS, HX OF 01/13/2009   Qualifier: Diagnosis of  By: Valma Cava LPN, Izora Gala    . Episode of generalized weakness 02/17/2016  . GERD (gastroesophageal reflux disease) 10/03/2012  . Hay fever   . History of colonic polyps   . History of  colonoscopy   . Hyperlipidemia   . MUSCLE STRAIN, HAMSTRING MUSCLE 09/08/2009   Qualifier: Diagnosis of  By: Elease Hashimoto MD, Bruce    . ORTHOSTATIC DIZZINESS 03/05/2010   Qualifier: Diagnosis of  By: Elease Hashimoto MD, Bruce    . Other premature beats 03/17/2010   Qualifier: Diagnosis of  By: Lovena Le, MD, Martyn Malay   . Premature ventricular contraction 01/05/2011  . S/P aortic dissection repair 05/08/2017  . THROMBOCYTOPENIA 03/05/2010   Qualifier: Diagnosis of  By: Elease Hashimoto MD, Bruce    . Weakness 12/09/2015    Past Surgical History:  Procedure Laterality Date  . ABDOMINAL ADHESION SURGERY  2007  . AORTIC INTERVENTION N/A 05/08/2017   Procedure: HYPOTHERMIC CIRCULATORY ARREST;  Surgeon: [name], MD;  Location: [place];  Service: Open Heart Surgery;  Laterality: N/A;  . ASCENDING AORTIC ROOT REPLACEMENT N/A 05/08/2017   Procedure: REPLACEMENT OF ASCENDING AORTIC ARCH;  Surgeon: [name], MD;  Location: [place];  Service: Open Heart Surgery;  Laterality: N/A;  . CANNULATION FOR CARDIOPULMONARY BYPASS Right 05/08/2017   Procedure: AXILLARY CANNULATION;  Surgeon: [name], MD;  Location: [place];  Service: Open Heart Surgery;  Laterality: Right;  . ENDOVASCULAR REPAIR/STENT GRAFT  05/08/2017   Procedure: STENT OF LEFT SUBCLAVIAN ARTERY with 10x39VBX;  Surgeon: [name], MD;  Location: [facility] OR;  Service: Open Heart Surgery;;  . herniated diks surgery L-5  1996  . herniated disk surgery c 6-7  1986  . INTRAOPERATIVE TRANSESOPHAGEAL ECHOCARDIOGRAM N/A 05/08/2017   Procedure: INTRAOPERATIVE TRANSESOPHAGEAL ECHOCARDIOGRAM;  Surgeon: [name], MD;  Location[facility] OR;  Service: Open Heart Surgery;  Laterality: N/A;  . REPLACEMENT ASCENDING AORTA N/A 05/08/2017   Procedure: REPLACEMENT ASCENDING AORTA;  Surgeon: [name], MD;  Location: [place];  Service: Open Heart Surgery;  Laterality: N/A;  . THORACIC AORTIC ENDOVASCULAR STENT GRAFT  05/08/2017    Procedure: STENT GRAFTING OF DESCENDING THORACIC AORTA;  Surgeon: [name], MD;  Location: [facility] OR;  Service: Open Heart Surgery;;     Medications: Current Meds  Medication Sig  . aspirin 81 MG tablet Take 1 tablet (81 mg total) by mouth daily.  . citalopram (CELEXA) 20 MG tablet Take 1 tablet by mouth once daily  . metoprolol tartrate (LOPRESSOR) 25 MG tablet TAKE 1/2 TABLET BY MOUTH TWICE A DAY  . Misc Natural Products (GLUCOS-CHONDROIT-MSM COMPLEX) TABS Take 1 tablet by mouth 3 (three) times daily.  . Omega-3 Fatty Acids (FISH OIL) 1000 MG CAPS Take 1 capsule by mouth daily.  . simvastatin (ZOCOR) 20 MG tablet TAKE 1 TABLET BY MOUTH AT BEDTIME  . warfarin (COUMADIN) 2.5 MG tablet Take 2 tablets daily except take 3 tablets on Tuesdays and Sat or Take as directed per coumadin clinic     Allergies: Allergies  Allergen Reactions  . 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 Quinolones     Patient was warned about not using Cipro and similar antibiotics. Recent studies have raised concern that fluoroquinolone antibiotics could be associated with an increased risk of aortic aneurysm Fluoroquinolones have non-antimicrobial properties that might jeopardise the integrity of the extracellular matrix of the vascular wall In a  propensity score matched cohort study in [place], there was a 66% increased rate of aortic aneurysm or dissection associated with oral fluoroquinolone use, compared wit    Social History: The patient  reports that he quit smoking about 45 years ago. He has never used smokeless tobacco. He reports that he does not drink alcohol or use drugs.   Family History: The patient's family history includes Alzheimer's disease (age of onset: 48) in his father; Cancer in his brother; Dementia in his father; Hypertension in an other family member; Stroke (age of onset: 9) in his mother; Sudden death in an other family member.   Review of  Systems: Please see the history of present illness.   Otherwise, the review of systems is positive for none.   All other systems are reviewed and negative.   Physical Exam: VS:  BP 134/72   Pulse (!) 54   Ht 5\' 8"  (1.727 m)   Wt 182 lb (82.6 kg)   SpO2 98%   BMI 27.67 kg/m  .  BMI Body mass index is 27.67 kg/m.  Wt Readings from Last 3 Encounters:  11/29/19 182 lb (82.6 kg)  06/06/19 183 lb (83 kg)  05/03/19 186 lb (84.4 kg)    Affect appropriate Healthy:  appears stated age 88: normal Neck supple with no adenopathy JVP normal no bruits no thyromegaly Lungs clear with no wheezing and good diaphragmatic motion Heart:  S1/S2 AR murmur, no rub, gallop or click PMI normal post sternotomy  Abdomen: benighn, BS positve, no tenderness, no AAA no bruit.  No HSM or HJR Distal  pulses intact with no bruits No edema Neuro non-focal Skin warm and dry No muscular weakness    LABORATORY DATA:  EKG:  11/29/19 SR rate 54 nonspecific ST changes   Lab Results  Component Value Date   WBC 6.9 06/01/2018   HGB 15.2 06/01/2018   HCT 44.0 06/01/2018   PLT 145.0 (L) 06/01/2018   GLUCOSE 100 (H) 05/02/2018   CHOL 140 05/02/2018   TRIG 87.0 05/02/2018   HDL 49.30 05/02/2018   LDLCALC 73 05/02/2018   ALT 13 05/02/2018   AST 22 05/02/2018   NA 139 05/02/2018   K 4.4 05/02/2018   CL 103 05/02/2018   CREATININE 1.20 05/02/2018   BUN 18 05/02/2018   CO2 28 05/02/2018   TSH 5.980 (H) 06/01/2017   PSA 1.55 05/02/2018   INR 2.4 09/03/2019   HGBA1C 6.1 (H) 12/09/2015     BNP (last 3 results) No results for input(s): BNP in the last 8760 hours.  ProBNP (last 3 results) No results for input(s): PROBNP in the last 8760 hours.   Other Studies Reviewed Today:  TEE: 05/08/2017  Left ventricle: Normal cavity size and wall thickness. LV systolic function is normal with an EF of 55-60%. There are no obvious wall motion abnormalities.  Aortic valve: The valve is trileaflet. Severe  regurgitation.  Aorta: The ascending aorta is dilated. 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  Right ventricle: Cavity is moderately to severely dilated. Moderately reduced systolic function.  Right atrium: Cavity is dilated.  Aorta: Graft present in the ascending aorta.     Assessment/Plan:  1. Type A Aortic Dissection F/U Gerhardt repair intact on cardiac CT 06/29/17 BP under good control  Review of TTE 04/11/18 shows mild AR normal EF and normal aortic root size with mild dilatation at the sinus level  Having f/u echo today Initial review with just Mild AR and nor residual aortic dilatation   2. Orthostatic hypotension/dizziness - improved with lower BP meds    3. Post op AF - now off amiodarone maintaining NSR   4. +HIT noted PLT still down 145 05/02/18 f/u primary   5. Popliteal Aneurysm:  Duplex 09/27/18 right mid popliteal 1.8 x 2.0 cm F/U with Dr Doren Custard STable by CT 05/03/19 duplex ordered as Dr Mee Hives note indicated he wanted f/u US 6 months after CTA   Given PAF, popliteal aneurysm ( currently no mural thrombus to potentially embolize distally given anticoagulation ) And thrombosis of the left subclavian vein would suggest that life long coumadin appropriate      Jenkins Rouge

## 2019-11-23 ENCOUNTER — Other Ambulatory Visit: Payer: Self-pay | Admitting: Cardiovascular Disease

## 2019-11-26 ENCOUNTER — Ambulatory Visit: Payer: Medicare HMO

## 2019-11-26 DIAGNOSIS — L821 Other seborrheic keratosis: Secondary | ICD-10-CM | POA: Diagnosis not present

## 2019-11-26 DIAGNOSIS — D225 Melanocytic nevi of trunk: Secondary | ICD-10-CM | POA: Diagnosis not present

## 2019-11-26 DIAGNOSIS — L570 Actinic keratosis: Secondary | ICD-10-CM | POA: Diagnosis not present

## 2019-11-26 DIAGNOSIS — D2261 Melanocytic nevi of right upper limb, including shoulder: Secondary | ICD-10-CM | POA: Diagnosis not present

## 2019-11-26 DIAGNOSIS — L814 Other melanin hyperpigmentation: Secondary | ICD-10-CM | POA: Diagnosis not present

## 2019-11-26 DIAGNOSIS — D485 Neoplasm of uncertain behavior of skin: Secondary | ICD-10-CM | POA: Diagnosis not present

## 2019-11-26 DIAGNOSIS — D692 Other nonthrombocytopenic purpura: Secondary | ICD-10-CM | POA: Diagnosis not present

## 2019-11-29 ENCOUNTER — Ambulatory Visit: Payer: Medicare HMO | Admitting: Cardiovascular Disease

## 2019-11-29 ENCOUNTER — Other Ambulatory Visit: Payer: Self-pay

## 2019-11-29 ENCOUNTER — Ambulatory Visit (HOSPITAL_COMMUNITY): Payer: Medicare HMO | Attending: Cardiovascular Disease

## 2019-11-29 ENCOUNTER — Encounter: Payer: Self-pay | Admitting: Cardiovascular Disease

## 2019-11-29 VITALS — BP 134/72 | HR 54 | Ht 68.0 in | Wt 182.0 lb

## 2019-11-29 DIAGNOSIS — Z8679 Personal history of other diseases of the circulatory system: Secondary | ICD-10-CM | POA: Diagnosis not present

## 2019-11-29 DIAGNOSIS — Z9889 Other specified postprocedural states: Secondary | ICD-10-CM | POA: Insufficient documentation

## 2019-11-29 DIAGNOSIS — I951 Orthostatic hypotension: Secondary | ICD-10-CM | POA: Insufficient documentation

## 2019-11-29 NOTE — Patient Instructions (Addendum)
Medication Instructions:  *If you need a refill on your cardiac medications before your next appointment, please call your pharmacy*  Lab Work: If you have labs (blood work) drawn today and your tests are completely normal, you will receive your results only by: [name] Kitchen MyChart Message (if you have MyChart) OR . A paper copy in the mail If you have any lab test that is abnormal or we need to change your treatment, we will call you to review the results.  Testing/Procedures: None ordered today.  Follow-Up: At [facility], you and your health needs are our priority.  As part of our continuing mission to provide you with exceptional heart care, we have created designated Provider Care Teams.  These Care Teams include your primary Cardiologist (physician) and Advanced Practice Providers (APPs -  Physician Assistants and Nurse Practitioners) who all work together to provide you with the care you need, when you need it.  We recommend signing up for the patient portal called "MyChart".  Sign up information is provided on this After Visit Summary.  MyChart is used to connect with patients for Virtual Visits (Telemedicine).  Patients are able to view lab/test results, encounter notes, upcoming appointments, etc.  Non-urgent messages can be sent to your provider as well.   To learn more about what you can do with MyChart, go to [email or web address].    Your next appointment:   12 month(s)  The format for your next appointment:   Either In Person or Virtual  Provider:   You may see Jenkins Rouge, MD or one of the following Advanced Practice Providers on your designated Care Team:    Truitt Merle, NP  Cecilie Kicks, NP  Kathyrn Drown, NP

## 2019-11-30 ENCOUNTER — Other Ambulatory Visit: Payer: Self-pay

## 2019-12-03 ENCOUNTER — Other Ambulatory Visit: Payer: Self-pay

## 2019-12-03 ENCOUNTER — Ambulatory Visit (INDEPENDENT_AMBULATORY_CARE_PROVIDER_SITE_OTHER): Payer: Medicare HMO | Admitting: General Practice

## 2019-12-03 DIAGNOSIS — Z7901 Long term (current) use of anticoagulants: Secondary | ICD-10-CM | POA: Diagnosis not present

## 2019-12-03 DIAGNOSIS — Z9889 Other specified postprocedural states: Secondary | ICD-10-CM

## 2019-12-03 LAB — POCT INR: INR: 2.9 (ref 2.0–3.0)

## 2019-12-03 NOTE — Patient Instructions (Addendum)
Pre visit review using our clinic review tool, if applicable. No additional management support is needed unless otherwise documented below in the visit note.  Continue taking 5mg daily except 7.5mg on Tuesdays and Saturdays. Recheck in 6 weeks. 

## 2020-01-04 ENCOUNTER — Other Ambulatory Visit: Payer: Self-pay | Admitting: *Deleted

## 2020-01-04 DIAGNOSIS — I724 Aneurysm of artery of lower extremity: Secondary | ICD-10-CM

## 2020-01-04 NOTE — Addendum Note (Signed)
Addended by: Leota Jacobsen on: 01/04/2020 03:14 PM   Modules accepted: Orders

## 2020-01-08 ENCOUNTER — Telehealth: Payer: Self-pay | Admitting: *Deleted

## 2020-01-08 NOTE — Telephone Encounter (Signed)
Patient called Nurse Triage 01/05/2020. Patient reported his BP is 129/51 concerned about the diastolic being low, and he is dizzy when he stands.  Patient was advised to go to ED. Clinic RN does not see where patient followed through.

## 2020-01-08 NOTE — Telephone Encounter (Signed)
Pt states he was dehydrated and did not go to ED just went and drank water . Pt states he is feeling fine now

## 2020-01-09 ENCOUNTER — Ambulatory Visit (HOSPITAL_COMMUNITY)
Admission: RE | Admit: 2020-01-09 | Discharge: 2020-01-09 | Disposition: A | Discharge status: Home / Self Care | Payer: Medicare HMO | Source: Ambulatory Visit | Attending: Surgery | Admitting: Surgery

## 2020-01-09 ENCOUNTER — Ambulatory Visit: Payer: Medicare HMO | Admitting: Vascular Surgery

## 2020-01-09 ENCOUNTER — Other Ambulatory Visit: Payer: Self-pay

## 2020-01-09 ENCOUNTER — Encounter: Payer: Self-pay | Admitting: Vascular Surgery

## 2020-01-09 VITALS — BP 134/74 | HR 55 | Temp 97.9°F | Resp 20 | Ht 68.0 in | Wt 182.0 lb

## 2020-01-09 DIAGNOSIS — I724 Aneurysm of artery of lower extremity: Secondary | ICD-10-CM

## 2020-01-09 NOTE — Progress Notes (Signed)
REASON FOR VISIT:   Follow-up of right popliteal artery aneurysm.  MEDICAL ISSUES:   RIGHT POPLITEAL ARTERY ANEURYSM: There has been no significant change in the size of his right popliteal artery aneurysm.  He is asymptomatic with palpable pedal pulses.  Therefore I think we will continue our plan for only considering addressing the right popliteal artery aneurysm if it enlarges significantly or he becomes symptomatic.  On the images I do not see a significant amount of laminated thrombus in the popliteal artery so I think he has a low risk of thrombosis or embolization.  I have ordered a follow-up duplex in 6 months and I will see him back at that time.  He knows to call sooner if he has problems.   HPI:   Don Medina is a pleasant 79 y.o. male who I had seen originally in consultation in 2019 with a right popliteal artery aneurysm that was found on MRI.  The patient is status post repair of an aortic dissection.  He had some chronic dissection extending down the infrarenal aorta into the common iliac artery.  His right popliteal artery measured 1.6 cm in maximum diameter initially.  I felt that given that this is fairly small given his age I would only recommend elective repair if this enlarge significantly or became symptomatic.  I last saw him on 06/05/2020.  At that time the maximum diameter of the right popliteal artery was 2.2 cm up slightly from 2 cm 6 months prior.  On the left side the popliteal artery measured 1.5 cm in maximum diameter.  Comes in for 40-month follow-up visit.  He denies any history of claudication, rest pain, or nonhealing ulcers.  He is not a smoker.  He is very active and works out at [employer] every day.  He is in excellent shape for his age.   Past Medical History:  Diagnosis Date  . 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 Arthritis   . Atrial fibrillation ([place]) [I48.91]  05/23/2017  . Blood in the stool   . BRADYCARDIA 03/10/2010   Qualifier: Diagnosis of  By: Elease Hashimoto MD, Bruce    . CHEST PAIN 03/19/2010   Qualifier: Diagnosis of  By: Allean Found, LPN, Christine    . Chicken pox   . COLONIC POLYPS, HX OF 01/13/2009   Qualifier: Diagnosis of  By: Valma Cava LPN, Izora Gala    . Episode of generalized weakness 02/17/2016  . GERD (gastroesophageal reflux disease) 10/03/2012  . Hay fever   . History of colonic polyps   . History of colonoscopy   . Hyperlipidemia   . MUSCLE STRAIN, HAMSTRING MUSCLE 09/08/2009   Qualifier: Diagnosis of  By: Elease Hashimoto MD, Bruce    . ORTHOSTATIC DIZZINESS 03/05/2010   Qualifier: Diagnosis of  By: Elease Hashimoto MD, Bruce    . Other premature beats 03/17/2010   Qualifier: Diagnosis of  By: Lovena Le, MD, Martyn Malay   . Premature ventricular contraction 01/05/2011  . S/P aortic dissection repair 05/08/2017  . THROMBOCYTOPENIA 03/05/2010   Qualifier: Diagnosis of  By: Elease Hashimoto MD, Bruce    . Weakness 12/09/2015    Family History  Problem Relation Age of Onset  . Alzheimer's disease Father 21  . Dementia Father   . Stroke Mother 9  . Cancer Brother        Leukemia  . Sudden death Other  family  . Hypertension Other   . Heart disease Neg Hx     SOCIAL HISTORY: Social History   Tobacco Use  . Smoking status: Former Smoker    Quit date: 08/09/1974    Years since quitting: 45.4  . Smokeless tobacco: Never Used  Substance Use Topics  . Alcohol use: No    Allergies  Allergen Reactions  . 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 Quinolones     Patient was warned about not using Cipro and similar antibiotics. Recent studies have raised concern that fluoroquinolone antibiotics could be associated with an increased risk of aortic aneurysm Fluoroquinolones have non-antimicrobial properties that might jeopardise the integrity of the extracellular matrix of the vascular wall In a  propensity score matched  cohort study in [place], there was a 66% increased rate of aortic aneurysm or dissection associated with oral fluoroquinolone use, compared wit    Current Outpatient Medications  Medication Sig Dispense Refill  . aspirin 81 MG tablet Take 1 tablet (81 mg total) by mouth daily. 30 tablet   . citalopram (CELEXA) 20 MG tablet Take 1 tablet by mouth once daily 90 tablet 3  . metoprolol tartrate (LOPRESSOR) 25 MG tablet TAKE 1/2 TABLET BY MOUTH TWICE A DAY 90 tablet 0  . Misc Natural Products (GLUCOS-CHONDROIT-MSM COMPLEX) TABS Take 1 tablet by mouth 3 (three) times daily.    . Omega-3 Fatty Acids (FISH OIL) 1000 MG CAPS Take 1 capsule by mouth daily.    . simvastatin (ZOCOR) 20 MG tablet TAKE 1 TABLET BY MOUTH AT BEDTIME 90 tablet 0  . warfarin (COUMADIN) 2.5 MG tablet Take 2 tablets daily except take 3 tablets on Tuesdays and Sat or Take as directed per coumadin clinic 220 tablet 1   No current facility-administered medications for this visit.    REVIEW OF SYSTEMS:  [X]  denotes positive finding, [ ]  denotes negative finding Cardiac  Comments:  Chest pain or chest pressure:    Shortness of breath upon exertion:    Short of breath when lying flat:    Irregular heart rhythm:        Vascular    Pain in calf, thigh, or hip brought on by ambulation:    Pain in feet at night that wakes you up from your sleep:     Blood clot in your veins:    Leg swelling:         Pulmonary    Oxygen at home:    Productive cough:     Wheezing:         Neurologic    Sudden weakness in arms or legs:     Sudden numbness in arms or legs:     Sudden onset of difficulty speaking or slurred speech:    Temporary loss of vision in one eye:     Problems with dizziness:         Gastrointestinal    Blood in stool:     Vomited blood:         Genitourinary    Burning when urinating:     Blood in urine:        Psychiatric    Major depression:         Hematologic    Bleeding problems:    Problems with  blood clotting too easily:        Skin    Rashes or ulcers:        Constitutional  Fever or chills:     PHYSICAL EXAM:   Vitals:   01/09/20 1224  BP: 134/74  Pulse: (!) 55  Resp: 20  Temp: 97.9 F (36.6 C)  SpO2: 95%  Weight: 182 lb (82.6 kg)  Height: 5\' 8"  (1.727 m)    GENERAL: The patient is a well-nourished male, in no acute distress. The vital signs are documented above. CARDIAC: There is a regular rate and rhythm.  VASCULAR: I do not detect carotid bruits. On the right side he has a palpable femoral, popliteal, dorsalis pedis, and posterior tibial pulse.  He has no evidence of atheroembolic disease to the right foot. On the left side he has a palpable femoral, popliteal, and posterior tibial pulse.  I cannot palpate a dorsalis pedis pulse.  He has no evidence of embolic disease to the left foot. He has no significant lower extremity swelling. PULMONARY: There is good air exchange bilaterally without wheezing or rales. ABDOMEN: Soft and non-tender with normal pitched bowel sounds.  I do not palpate an abdominal aortic aneurysm. MUSCULOSKELETAL: There are no major deformities or cyanosis. NEUROLOGIC: No focal weakness or paresthesias are detected. SKIN: There are no ulcers or rashes noted. PSYCHIATRIC: The patient has a normal affect.  DATA:    BILATERAL LOWER EXTREMITY ARTERIAL DUPLEX: I have independently interpreted his bilateral lower extremity popliteal artery duplex scans.  On the right side the maximum diameter of the right popliteal artery is 2.1 cm which is not changed compared to 2.2 cm 6 months ago.  I do not see a significant amount of laminated thrombus within the popliteal artery.  On the left side the maximum diameter is 1.4 cm which again is not changed compared to 1.5 cm 6 months ago.  Again I do not see a significant laminated thrombus in the popliteal artery.  Deitra Mayo [facility] [phone]

## 2020-01-11 ENCOUNTER — Other Ambulatory Visit: Payer: Self-pay | Admitting: *Deleted

## 2020-01-11 DIAGNOSIS — I724 Aneurysm of artery of lower extremity: Secondary | ICD-10-CM

## 2020-01-13 ENCOUNTER — Other Ambulatory Visit: Payer: Self-pay | Admitting: Family Medicine

## 2020-01-14 ENCOUNTER — Other Ambulatory Visit: Payer: Self-pay

## 2020-01-14 ENCOUNTER — Other Ambulatory Visit: Payer: Self-pay | Admitting: Family Medicine

## 2020-01-14 ENCOUNTER — Other Ambulatory Visit: Payer: Self-pay | Admitting: Cardiovascular Disease

## 2020-01-14 ENCOUNTER — Ambulatory Visit (INDEPENDENT_AMBULATORY_CARE_PROVIDER_SITE_OTHER): Payer: Medicare HMO | Admitting: General Practice

## 2020-01-14 DIAGNOSIS — Z7901 Long term (current) use of anticoagulants: Secondary | ICD-10-CM

## 2020-01-14 DIAGNOSIS — Z9889 Other specified postprocedural states: Secondary | ICD-10-CM

## 2020-01-14 DIAGNOSIS — I480 Paroxysmal atrial fibrillation: Secondary | ICD-10-CM

## 2020-01-14 LAB — POCT INR: INR: 2.5 (ref 2.0–3.0)

## 2020-01-14 NOTE — Patient Instructions (Addendum)
Pre visit review using our clinic review tool, if applicable. No additional management support is needed unless otherwise documented below in the visit note.  Continue taking 5mg daily except 7.5mg on Tuesdays and Saturdays. Recheck in 6 weeks. 

## 2020-01-14 NOTE — Telephone Encounter (Signed)
Please advise 

## 2020-01-29 ENCOUNTER — Other Ambulatory Visit: Payer: Self-pay | Admitting: Family Medicine

## 2020-02-23 ENCOUNTER — Other Ambulatory Visit: Payer: Self-pay | Admitting: Family Medicine

## 2020-02-25 ENCOUNTER — Ambulatory Visit (INDEPENDENT_AMBULATORY_CARE_PROVIDER_SITE_OTHER): Payer: Medicare HMO | Admitting: General Practice

## 2020-02-25 ENCOUNTER — Other Ambulatory Visit: Payer: Self-pay

## 2020-02-25 DIAGNOSIS — Z9889 Other specified postprocedural states: Secondary | ICD-10-CM

## 2020-02-25 DIAGNOSIS — Z7901 Long term (current) use of anticoagulants: Secondary | ICD-10-CM

## 2020-02-25 LAB — POCT INR: INR: 2.7 (ref 2.0–3.0)

## 2020-02-25 NOTE — Patient Instructions (Signed)
Pre visit review using our clinic review tool, if applicable. No additional management support is needed unless otherwise documented below in the visit note.  Continue taking 5mg daily except 7.5mg on Tuesdays and Saturdays. Recheck in 6 weeks. 

## 2020-03-18 ENCOUNTER — Ambulatory Visit: Payer: Self-pay | Attending: Internal Medicine

## 2020-03-18 DIAGNOSIS — Z23 Encounter for immunization: Secondary | ICD-10-CM

## 2020-03-18 NOTE — Progress Notes (Signed)
   Covid-19 Vaccination Clinic  Name:  Don Medina    MRN: [id number] DOB: 1941-02-23  03/18/2020  [name] was observed post Covid-19 immunization for 15 minutes without incident. He was provided with Vaccine Information Sheet and instruction to access the V-Safe system.   [name] was instructed to call 911 with any severe reactions post vaccine: [name] Kitchen Difficulty breathing  . Swelling of face and throat  . A fast heartbeat  . A bad rash all over body  . Dizziness and weakness   Immunizations Administered    Name Date Dose VIS Date Route   Pfizer COVID-19 Vaccine 03/18/2020  8:50 AM 0.3 mL 10/03/2018 Intramuscular   Manufacturer: [place]   Lot: [id number]   [place]: [address]-0

## 2020-03-20 ENCOUNTER — Other Ambulatory Visit: Payer: Self-pay | Admitting: Family Medicine

## 2020-04-07 ENCOUNTER — Ambulatory Visit (INDEPENDENT_AMBULATORY_CARE_PROVIDER_SITE_OTHER): Payer: Medicare HMO | Admitting: General Practice

## 2020-04-07 ENCOUNTER — Other Ambulatory Visit: Payer: Self-pay

## 2020-04-07 DIAGNOSIS — I4891 Unspecified atrial fibrillation: Secondary | ICD-10-CM | POA: Diagnosis not present

## 2020-04-07 DIAGNOSIS — Z7901 Long term (current) use of anticoagulants: Secondary | ICD-10-CM

## 2020-04-07 DIAGNOSIS — Z9889 Other specified postprocedural states: Secondary | ICD-10-CM

## 2020-04-07 LAB — POCT INR: INR: 3.1 — AB (ref 2.0–3.0)

## 2020-04-07 NOTE — Patient Instructions (Addendum)
Pre visit review using our clinic review tool, if applicable. No additional management support is needed unless otherwise documented below in the visit note.  Skip dosage today (8/30) and then continue taking 5mg  daily except 7.5mg  on Tuesdays and Saturdays. Recheck in 4 weeks.

## 2020-04-08 ENCOUNTER — Ambulatory Visit: Payer: Medicare HMO

## 2020-04-09 ENCOUNTER — Other Ambulatory Visit: Payer: Self-pay | Admitting: *Deleted

## 2020-04-09 DIAGNOSIS — Z9889 Other specified postprocedural states: Secondary | ICD-10-CM

## 2020-04-19 ENCOUNTER — Encounter: Payer: Self-pay | Admitting: Family Medicine

## 2020-04-25 ENCOUNTER — Encounter: Payer: Self-pay | Admitting: Family Medicine

## 2020-04-29 DIAGNOSIS — R69 Illness, unspecified: Secondary | ICD-10-CM | POA: Diagnosis not present

## 2020-05-05 ENCOUNTER — Ambulatory Visit (INDEPENDENT_AMBULATORY_CARE_PROVIDER_SITE_OTHER): Payer: Medicare HMO | Admitting: Family Medicine

## 2020-05-05 ENCOUNTER — Other Ambulatory Visit: Payer: Self-pay

## 2020-05-05 ENCOUNTER — Encounter: Payer: Self-pay | Admitting: Family Medicine

## 2020-05-05 ENCOUNTER — Ambulatory Visit (INDEPENDENT_AMBULATORY_CARE_PROVIDER_SITE_OTHER): Payer: Medicare HMO | Admitting: General Practice

## 2020-05-05 VITALS — BP 116/70 | HR 52 | Temp 98.1°F | Ht 68.0 in | Wt 181.2 lb

## 2020-05-05 DIAGNOSIS — E785 Hyperlipidemia, unspecified: Secondary | ICD-10-CM

## 2020-05-05 DIAGNOSIS — I4891 Unspecified atrial fibrillation: Secondary | ICD-10-CM

## 2020-05-05 DIAGNOSIS — Z7901 Long term (current) use of anticoagulants: Secondary | ICD-10-CM

## 2020-05-05 DIAGNOSIS — Z9889 Other specified postprocedural states: Secondary | ICD-10-CM

## 2020-05-05 LAB — POCT INR: INR: 2.5 (ref 2.0–3.0)

## 2020-05-05 NOTE — Patient Instructions (Addendum)
Pre visit review using our clinic review tool, if applicable. No additional management support is needed unless otherwise documented below in the visit note.  Continue taking 5mg daily except 7.5mg on Tuesdays and Saturdays. Recheck in 6 weeks. 

## 2020-05-05 NOTE — Patient Instructions (Signed)
Make sure we get copy of labs from the [place]

## 2020-05-05 NOTE — Progress Notes (Signed)
Established Patient Office Visit  Subjective:  Patient ID: Don Medina, male    DOB: Oct 28, 1940  Age: 79 y.o. MRN: [id number]  CC:  Chief Complaint  Patient presents with  . Medication Refill    Doing well    HPI Don Medina presents for medical follow-up.  He has history of aortic dissection repair, atrial fibrillation, history of PVCs, GERD, hyperlipidemia.  He has follow-up scheduled with [facility] and should get CT angiogram of the chest soon.  Takes simvastatin for hyperlipidemia.  Remains on Coumadin and is followed closely through Coumadin clinic.  He takes metoprolol.  No recent chest pains.  He goes to the [facility] and is exercising fairly regularly.  He has a daughter down in [place] who has history of alcohol abuse and is on liver transplant list.  Annette Stable still goes to the [place] and plans to see them in early October.  He usually gets labs with them and plans to forward those labs to [place]. He has already had flu vaccine on 04/29/2020.  Covid vaccines up-to-date including booster  Past Medical History:  Diagnosis Date  . 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 Arthritis   . Atrial fibrillation (HCC) [I48.91] 05/23/2017  . Blood in the stool   . BRADYCARDIA 03/10/2010   Qualifier: Diagnosis of  By: Caryl Never MD, Daire Okimoto    . CHEST PAIN 03/19/2010   Qualifier: Diagnosis of  By: Elyn Peers, LPN, Christine    . Chicken pox   . COLONIC POLYPS, HX OF 01/13/2009   Qualifier: Diagnosis of  By: Gabriel Rung LPN, Harriett Sine    . Episode of generalized weakness 02/17/2016  . GERD (gastroesophageal reflux disease) 10/03/2012  . Hay fever   . History of colonic polyps   . History of colonoscopy   . Hyperlipidemia   . MUSCLE STRAIN, HAMSTRING MUSCLE 09/08/2009   Qualifier: Diagnosis of  By: Caryl Never MD, Ronalee Scheunemann    . ORTHOSTATIC DIZZINESS 03/05/2010   Qualifier: Diagnosis of  By: Caryl Never MD, Janayia Burggraf    . Other premature  beats 03/17/2010   Qualifier: Diagnosis of  By: Ladona Ridgel, MD, Jerrell Mylar   . Premature ventricular contraction 01/05/2011  . S/P aortic dissection repair 05/08/2017  . THROMBOCYTOPENIA 03/05/2010   Qualifier: Diagnosis of  By: Caryl Never MD, Jospeh Mangel    . Weakness 12/09/2015    Past Surgical History:  Procedure Laterality Date  . ABDOMINAL ADHESION SURGERY  2007  . AORTIC INTERVENTION N/A 05/08/2017   Procedure: HYPOTHERMIC CIRCULATORY ARREST;  Surgeon: Delight Ovens, MD;  Location: [facility] OR;  Service: Open Heart Surgery;  Laterality: N/A;  . ASCENDING AORTIC ROOT REPLACEMENT N/A 05/08/2017   Procedure: REPLACEMENT OF ASCENDING AORTIC ARCH;  Surgeon: Delight Ovens, MD;  Location: [facility] OR;  Service: Open Heart Surgery;  Laterality: N/A;  . CANNULATION FOR CARDIOPULMONARY BYPASS Right 05/08/2017   Procedure: AXILLARY CANNULATION;  Surgeon: Delight Ovens, MD;  Location[facility] OR;  Service: Open Heart Surgery;  Laterality: Right;  . ENDOVASCULAR REPAIR/STENT GRAFT  05/08/2017   Procedure: STENT OF LEFT SUBCLAVIAN ARTERY with 10x39VBX;  Surgeon: Delight Ovens, MD;  Location: [facility] OR;  Service: Open Heart Surgery;;  . herniated diks surgery L-5  1996  . herniated disk surgery c 6-7  1986  . INTRAOPERATIVE TRANSESOPHAGEAL ECHOCARDIOGRAM N/A 05/08/2017   Procedure: INTRAOPERATIVE TRANSESOPHAGEAL ECHOCARDIOGRAM;  Surgeon: Delight Ovens, MD;  Location: [facility] OR;  Service: Open Heart Surgery;  Laterality: N/A;  . REPLACEMENT ASCENDING AORTA N/A 05/08/2017   Procedure: REPLACEMENT ASCENDING AORTA;  Surgeon: Delight Ovens, MD;  Location: [facility] OR;  Service: Open Heart Surgery;  Laterality: N/A;  . THORACIC AORTIC ENDOVASCULAR STENT GRAFT  05/08/2017   Procedure: STENT GRAFTING OF DESCENDING THORACIC AORTA;  Surgeon: Delight Ovens, MD;  Location: [facility] OR;  Service: Open Heart Surgery;;    Family History  Problem Relation Age of Onset  . Alzheimer's disease Father 82  . Dementia Father   .  Stroke Mother 78  . Cancer Brother        Leukemia  . Sudden death Other        family  . Hypertension Other   . Heart disease Neg Hx     Social History   Socioeconomic History  . Marital status: Married    Spouse name: Not on file  . Number of children: Not on file  . Years of education: Not on file  . Highest education level: Not on file  Occupational History  . Occupation: Retired  Tobacco Use  . Smoking status: Former Smoker    Quit date: 08/09/1974    Years since quitting: 45.7  . Smokeless tobacco: Never Used  Vaping Use  . Vaping Use: Never used  Substance and Sexual Activity  . Alcohol use: No  . Drug use: No  . Sexual activity: Not Currently  Other Topics Concern  . Not on file  Social History Narrative  . Not on file   Social Determinants of Health   Financial Resource Strain:   . Difficulty of Paying Living Expenses: Not on file  Food Insecurity:   . Worried About Programme researcher, broadcasting/film/video in the Last Year: Not on file  . Ran Out of Food in the Last Year: Not on file  Transportation Needs:   . Lack of Transportation (Medical): Not on file  . Lack of Transportation (Non-Medical): Not on file  Physical Activity:   . Days of Exercise per Week: Not on file  . Minutes of Exercise per Session: Not on file  Stress:   . Feeling of Stress : Not on file  Social Connections:   . Frequency of Communication with Friends and Family: Not on file  . Frequency of Social Gatherings with Friends and Family: Not on file  . Attends Religious Services: Not on file  . Active Member of Clubs or Organizations: Not on file  . Attends Banker Meetings: Not on file  . Marital Status: Not on file  Intimate Partner Violence:   . Fear of Current or Ex-Partner: Not on file  . Emotionally Abused: Not on file  . Physically Abused: Not on file  . Sexually Abused: Not on file    Outpatient Medications Prior to Visit  Medication Sig Dispense Refill  . aspirin 81 MG tablet  Take 1 tablet (81 mg total) by mouth daily. 30 tablet   . citalopram (CELEXA) 20 MG tablet TAKE 1 TABLET BY MOUTH EVERY DAY 90 tablet 0  . metoprolol tartrate (LOPRESSOR) 25 MG tablet TAKE 1/2 TABLET BY MOUTH TWICE A DAY 90 tablet 2  . Misc Natural Products (GLUCOS-CHONDROIT-MSM COMPLEX) TABS Take 1 tablet by mouth 3 (three) times daily.    . Omega-3 Fatty Acids (FISH OIL) 1000 MG CAPS Take 1 capsule by mouth daily.    . simvastatin (ZOCOR) 20 MG tablet TAKE 1 TABLET BY MOUTH AT BEDTIME**NEED OFFICE VISIT** 30  tablet 0  . warfarin (COUMADIN) 2.5 MG tablet TAKE 2 TABLETS DAILY EXCEPT TAKE 3 TABS ON TUESDAYS AND SAT OR TAKE AS DIRECTED PER COUMADIN CLINIC 220 tablet 1   No facility-administered medications prior to visit.    Allergies  Allergen Reactions  . 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 Quinolones     Patient was warned about not using Cipro and similar antibiotics. Recent studies have raised concern that fluoroquinolone antibiotics could be associated with an increased risk of aortic aneurysm Fluoroquinolones have non-antimicrobial properties that might jeopardise the integrity of the extracellular matrix of the vascular wall In a  propensity score matched cohort study in [place], there was a 66% increased rate of aortic aneurysm or dissection associated with oral fluoroquinolone use, compared wit    ROS Review of Systems  Constitutional: Negative for fatigue.  Eyes: Negative for visual disturbance.  Respiratory: Negative for cough, chest tightness and shortness of breath.   Cardiovascular: Negative for chest pain, palpitations and leg swelling.  Neurological: Negative for dizziness, syncope, weakness, light-headedness and headaches.      Objective:    Physical Exam Constitutional:      Appearance: He is well-developed.  HENT:     Right Ear: External ear normal.     Left Ear: External ear normal.  Eyes:     Pupils: Pupils are equal,  round, and reactive to light.  Neck:     Thyroid: No thyromegaly.  Cardiovascular:     Rate and Rhythm: Normal rate.     Heart sounds: Murmur heard.   Pulmonary:     Effort: Pulmonary effort is normal. No respiratory distress.     Breath sounds: Normal breath sounds. No wheezing or rales.  Musculoskeletal:     Cervical back: Neck supple.     Right lower leg: No edema.     Left lower leg: No edema.  Neurological:     Mental Status: He is alert and oriented to person, place, and time.     BP 116/70   Pulse (!) 52   Temp 98.1 F (36.7 C) (Oral)   Ht 5\' 8"  (1.727 m)   Wt 181 lb 3.2 oz (82.2 kg)   SpO2 98%   BMI 27.55 kg/m  Wt Readings from Last 3 Encounters:  05/05/20 181 lb 3.2 oz (82.2 kg)  01/09/20 182 lb (82.6 kg)  11/29/19 182 lb (82.6 kg)     Health Maintenance Due  Topic Date Due  . Hepatitis C Screening  Never done    There are no preventive care reminders to display for this patient.  Lab Results  Component Value Date   TSH 5.980 (H) 06/01/2017   Lab Results  Component Value Date   WBC 6.9 06/01/2018   HGB 15.2 06/01/2018   HCT 44.0 06/01/2018   MCV 90.9 06/01/2018   PLT 145.0 (L) 06/01/2018   Lab Results  Component Value Date   NA 139 05/02/2018   K 4.4 05/02/2018   CO2 28 05/02/2018   GLUCOSE 100 (H) 05/02/2018   BUN 18 05/02/2018   CREATININE 1.20 05/02/2018   BILITOT 0.8 05/02/2018   ALKPHOS 44 05/02/2018   AST 22 05/02/2018   ALT 13 05/02/2018   PROT 6.8 05/02/2018   ALBUMIN 4.0 05/02/2018   CALCIUM 9.2 05/02/2018   ANIONGAP 11 09/17/2017   GFR 62.51 05/02/2018   Lab Results  Component Value Date   CHOL 140 05/02/2018   Lab  Results  Component Value Date   HDL 49.30 05/02/2018   Lab Results  Component Value Date   LDLCALC 73 05/02/2018   Lab Results  Component Value Date   TRIG 87.0 05/02/2018   Lab Results  Component Value Date   CHOLHDL 3 05/02/2018   Lab Results  Component Value Date   HGBA1C 6.1 (H) 12/09/2015        Assessment & Plan:   #1 past history of atrial fibrillation.  He has had previous aortic dissection repair and has scheduled follow-up with [facility] -Overall doing well.  Exercising regularly.  Remains on metoprolol and Coumadin.  Followed regularly through Coumadin clinic  #2 hyperlipidemia treated with simvastatin.  -Needs follow-up lipid and hepatic panel and he plans to get this through the [place].  We have asked that he get those labs to [place].  No orders of the defined types were placed in this encounter.   Follow-up: No follow-ups on file.    Evelena Peat, MD

## 2020-05-08 ENCOUNTER — Other Ambulatory Visit: Payer: Medicare HMO

## 2020-05-08 ENCOUNTER — Encounter: Payer: Self-pay | Admitting: Cardiothoracic Surgery

## 2020-05-08 ENCOUNTER — Other Ambulatory Visit: Payer: Self-pay

## 2020-05-08 ENCOUNTER — Ambulatory Visit
Admission: RE | Admit: 2020-05-08 | Discharge: 2020-05-08 | Disposition: A | Discharge status: Home / Self Care | Payer: Medicare HMO | Source: Ambulatory Visit | Attending: Cardiothoracic Surgery | Admitting: Cardiothoracic Surgery

## 2020-05-08 ENCOUNTER — Ambulatory Visit (INDEPENDENT_AMBULATORY_CARE_PROVIDER_SITE_OTHER): Payer: Medicare HMO | Admitting: Cardiothoracic Surgery

## 2020-05-08 VITALS — BP 157/73 | HR 54 | Temp 98.1°F | Resp 18 | Ht 68.0 in | Wt 180.4 lb

## 2020-05-08 DIAGNOSIS — Z9889 Other specified postprocedural states: Secondary | ICD-10-CM

## 2020-05-08 DIAGNOSIS — I7102 Dissection of abdominal aorta: Secondary | ICD-10-CM | POA: Diagnosis not present

## 2020-05-08 DIAGNOSIS — Z95828 Presence of other vascular implants and grafts: Secondary | ICD-10-CM | POA: Diagnosis not present

## 2020-05-08 DIAGNOSIS — M5386 Other specified dorsopathies, lumbar region: Secondary | ICD-10-CM | POA: Diagnosis not present

## 2020-05-08 DIAGNOSIS — K8020 Calculus of gallbladder without cholecystitis without obstruction: Secondary | ICD-10-CM | POA: Diagnosis not present

## 2020-05-08 MED ORDER — IOPAMIDOL (ISOVUE-370) INJECTION 76%
75.0000 mL | Freq: Once | INTRAVENOUS | Status: AC | PRN
  Administered 2020-05-08: 75 mL via INTRAVENOUS

## 2020-05-08 NOTE — Progress Notes (Signed)
[address]       [place] [address]             [id number]      Don Medina Madonna Hooven Medical Record #[id number] Date of Birth: 03/15/1941  Referring: Lacretia Leigh, MD Primary Care: Eulas Post, MD Primary Cardiology:Peter Johnsie Cancel, MD  \ Chief Complaint:   POST OP FOLLOW UP 05/08/2017 OPERATIVE REPORT PREOPERATIVE DIAGNOSIS:  Acute type 1 aortic dissection. POSTOPERATIVE DIAGNOSIS:  Acute type 1 aortic dissection. SURGICAL PROCEDURES: 1. Cardiopulmonary bypass with hypothermic circulatory arrest and     antegrade cerebral perfusion. 2. Resuspension, repair of aortic valve, replacement of ascending     aorta, arch with debranching of the cerebral vessels, frozen     elephant trunk with placement of 31 x 31 x 10 cm CTAG Gore stent     graft, placement of a 10 x 39 VBX balloon expandable stent into the     left subclavian artery, a 12 x 8-cm graft off the ascending aorta     to the innominate and left carotid arteries.    History of Present Illness:     Patient returns to the office with follow-up CTA of the chest abdomen and pelvis following his acute aortic dissection repair on 05/08/2017 2018- exactly  3 years today. He remains active with out angina or CHF. He continues on coumadin .    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 Arthritis   . Atrial fibrillation ([place]) [I48.91] 05/23/2017  . Blood in the stool   . BRADYCARDIA 03/10/2010   Qualifier: Diagnosis of  By: Elease Hashimoto MD, Bruce    . CHEST PAIN 03/19/2010    Qualifier: Diagnosis of  By: Allean Found, LPN, Christine    . Chicken pox   . COLONIC POLYPS, HX OF 01/13/2009   Qualifier: Diagnosis of  By: Valma Cava LPN, Izora Gala    . Episode of generalized weakness 02/17/2016  . GERD (gastroesophageal reflux disease) 10/03/2012  . Hay fever   . History of colonic polyps   . History of colonoscopy   . Hyperlipidemia   . MUSCLE STRAIN, HAMSTRING MUSCLE 09/08/2009   Qualifier: Diagnosis of  By: Elease Hashimoto MD, Bruce    . ORTHOSTATIC DIZZINESS 03/05/2010   Qualifier: Diagnosis of  By: Elease Hashimoto MD, Bruce    . Other premature beats 03/17/2010   Qualifier: Diagnosis of  By: Lovena Le, MD, Martyn Malay   . Premature ventricular contraction 01/05/2011  . S/P aortic dissection repair 05/08/2017  . THROMBOCYTOPENIA 03/05/2010   Qualifier: Diagnosis of  By: Elease Hashimoto MD, Bruce    . Weakness 12/09/2015     Social History   Tobacco Use  Smoking Status Former Smoker  . Quit date: 08/09/1974  . Years since quitting: 45.7  Smokeless Tobacco Never Used    Social History   Substance and Sexual Activity  Alcohol Use No     Allergies  Allergen Reactions  . 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  Quinolones     Patient was warned about not using Cipro and similar antibiotics. Recent studies have raised concern that fluoroquinolone antibiotics could be associated with an increased risk of aortic aneurysm Fluoroquinolones have non-antimicrobial properties that might jeopardise the integrity of the extracellular matrix of the vascular wall In a  propensity score matched cohort study in [place], there was a 66% increased rate of aortic aneurysm or dissection associated with oral fluoroquinolone use, compared wit    Current Outpatient Medications  Medication Sig Dispense Refill  . aspirin 81 MG tablet Take 1 tablet (81 mg total) by mouth daily. 30 tablet   . citalopram (CELEXA) 20 MG tablet TAKE 1 TABLET BY MOUTH EVERY DAY 90 tablet 0  .  metoprolol tartrate (LOPRESSOR) 25 MG tablet TAKE 1/2 TABLET BY MOUTH TWICE A DAY 90 tablet 2  . Misc Natural Products (GLUCOS-CHONDROIT-MSM COMPLEX) TABS Take 1 tablet by mouth 3 (three) times daily.    . Omega-3 Fatty Acids (FISH OIL) 1000 MG CAPS Take 1 capsule by mouth daily.    . simvastatin (ZOCOR) 20 MG tablet TAKE 1 TABLET BY MOUTH AT BEDTIME**NEED OFFICE VISIT** 30 tablet 0  . warfarin (COUMADIN) 2.5 MG tablet TAKE 2 TABLETS DAILY EXCEPT TAKE 3 TABS ON TUESDAYS AND SAT OR TAKE AS DIRECTED PER COUMADIN CLINIC 220 tablet 1   No current facility-administered medications for this visit.       Physical Exam: BP (!) 157/73 (BP Location: Right Arm, Patient Position: Sitting, Cuff Size: Normal)   Pulse (!) 54   Temp 98.1 F (36.7 C) (Oral)   Resp 18   Ht 5\' 8"  (1.727 m)   Wt 180 lb 6.4 oz (81.8 kg)   SpO2 95% Comment: RA  BMI 27.43 kg/m  General appearance: alert, cooperative and no distress Neck: no adenopathy, no carotid bruit, no JVD, supple, symmetrical, trachea midline and thyroid not enlarged, symmetric, no tenderness/mass/nodules Lymph nodes: Cervical, supraclavicular, and axillary nodes normal. Resp: clear to auscultation bilaterally Cardio: regular rate and rhythm, S1, S2 normal, no murmur, click, rub or gallop GI: soft, non-tender; bowel sounds normal; no masses,  no organomegaly Extremities: extremities normal, atraumatic, no cyanosis or edema and Homans sign is negative, no sign of DVT Neurologic: Grossly normal  Sternum stable and well-healed  prominent bilateral popliteal pulses on exam R>L  Diagnostic Studies & Laboratory data:     Recent Radiology Findings:  CT ANGIO CHEST AORTA W/CM & OR WO/CM  Result Date: 05/08/2020 CLINICAL DATA:  History of prior aortic dissection with stent graft placement EXAM: CT ANGIOGRAPHY CHEST WITH CONTRAST TECHNIQUE: Multidetector CT imaging of the chest was performed using the standard protocol during bolus administration of  intravenous contrast. Multiplanar CT image reconstructions and MIPs were obtained to evaluate the vascular anatomy. CONTRAST:  37mL ISOVUE-370 IOPAMIDOL (ISOVUE-370) INJECTION 76% COMPARISON:  05/03/2019 FINDINGS: Cardiovascular: No significant cardiac enlargement is noted. There are changes consistent with prior ascending aortic graft repair stable in appearance from the prior exam. Additionally a stent graft is noted in the distal aspect of the thoracic arch extending into the proximal descending thoracic aorta. Dissection flap is again identified just below the stent graft extending into the upper abdominal aorta. The overall appearance is similar to that noted on the prior exam. Some fenestrations in the flap are noted at the level of the superior mesenteric artery. Pulmonary artery as visualized is within normal limits. No pulmonary emboli are seen. Left subclavian stent is noted and patent. Replaced right innominate  and left common carotid artery are seen. Mediastinum/Nodes: Thoracic inlet is within normal limits. No hilar or mediastinal adenopathy is noted. The esophagus as visualized is within normal limits. Lungs/Pleura: Lungs are well aerated bilaterally. No focal infiltrate or sizable effusion is seen. No sizable parenchymal nodules are noted. Upper Abdomen: Visualized upper abdomen shows stable cholelithiasis. No acute abnormality is noted. Musculoskeletal: No acute bony abnormality is seen. Stable L1 compression deformity. Review of the MIP images confirms the above findings. IMPRESSION: Changes consistent with prior type a dissection with ascending aortic repair and reimplantation of the right innominate and left carotid arteries. Stent graft is noted in the distal arch extending into the proximal descending aorta with left subclavian stent. These are both widely patent. Dissection flap is noted in the descending aorta stable from the prior exam. Some fenestrations are noted in the upper abdominal  aorta. The overall appearance is stable from the prior study. Cholelithiasis without complicating factors. No acute abnormality noted. Electronically Signed   By: Inez Catalina M.D.   On: 05/08/2020 12:25   I have independently reviewed the above radiology studies  and reviewed the findings with the patient.    Ct Angio Chest Aorta W/cm &/or Wo/cm  Result Date: 05/04/2018 CLINICAL DATA:  79 year old with history of thoracic aortic dissection. Ascending aortic replacement of 05/08/2017. EXAM: CT ANGIOGRAPHY CHEST, ABDOMEN AND PELVIS TECHNIQUE: Multidetector CT imaging through the chest, abdomen and pelvis was performed using the standard protocol during bolus administration of intravenous contrast. Multiplanar reconstructed images and MIPs were obtained and reviewed to evaluate the vascular anatomy. CONTRAST:  41mL ISOVUE-370 IOPAMIDOL (ISOVUE-370) INJECTION 76% COMPARISON:  Chest CT 04/06/2018 FINDINGS: CTA CHEST FINDINGS Cardiovascular: Stable postsurgical changes compatible with replacement of the ascending thoracic aorta, placement of aortic arch stent, replacement of the origin of the right innominate artery and left common carotid artery and stent placement in the left subclavian artery. The ascending aortic root replacement is widely patent. The graft supplying the left common carotid artery and right innominate artery are patent. Previously, there was a dissection involving the left common carotid artery but this has resolved. Bilateral common carotid arteries are patent. Bilateral vertebral arteries are patent. Bilateral subclavian arteries are patent including the stented left subclavian artery. The stent graft extends into the proximal descending thoracic aorta. Again noted is a dissection involving the descending thoracic aorta just distal to the stent graft. True lumen is probably along the ventral aspect. Configuration and size of the descending thoracic aorta and dissection are unchanged. No  evidence for aortic aneurysm. Pulmonary arteries are not well opacified on this examination. There is flow in the coronary arteries but poorly characterized on this examination. Heart size is normal without significant pericardial fluid. Mediastinum/Nodes: No chest lymphadenopathy. Thyroid tissue is unremarkable. No axillary lymphadenopathy. Lungs/Pleura: Trachea and mainstem bronchi are patent. Stable 4 mm pleural-based nodule along the right upper lung on sequence 21, image 79 is unchanged since 2018 and likely benign. No large areas of consolidation or airspace disease. No pleural effusions. Musculoskeletal: No acute bone abnormality. Review of the MIP images confirms the above findings. CTA ABDOMEN AND PELVIS FINDINGS VASCULAR Aorta: Aortic dissection extends into the abdominal aorta. Perforations within the intimal flap throughout the abdominal aorta and the aortic bifurcation. Dissection terminates at the aortic bifurcation. The distal abdominal aorta measures up to 2.6 cm which has minimally changed. The ventral lumen appears to represent the true lumen. Celiac: Origin of the celiac trunk is not well demonstrated and suspect  there is underlying median arcuate ligament compression. Again, there may be a small dissection flap at the origin of the celiac trunk. The main branches of the celiac trunk are patent. SMA: SMA originates from the true lumen and the SMA is widely patent. Renals: Right renal artery originates from the true lumen and widely patent. Left renal artery appears to originate from the more posterior false lumen but main left renal artery is widely patent. There is an accessory inferior left renal artery originating from the ventral true lumen. IMA: IMA originates from the true lumen and patent. Inflow: Dissection does not extend into the iliac arteries. Stable ectasia of the right common iliac artery measuring 1.9 cm and left common iliac artery measures 1.7 cm. Internal and external iliac  arteries are patent bilaterally. Proximal femoral arteries are patent bilaterally. Veins: No obvious venous abnormality within the limitations of this arterial phase study. Review of the MIP images confirms the above findings. NON-VASCULAR Hepatobiliary: No acute abnormality to the liver or gallbladder. No significant biliary dilatation. Pancreas: Unremarkable. No pancreatic ductal dilatation or surrounding inflammatory changes. Spleen: Normal in size without focal abnormality. Adrenals/Urinary Tract: Normal adrenal glands. Scarring along the posterior aspect of the right kidney. No hydronephrosis. Urinary bladder is unremarkable. Stomach/Bowel: Normal appearance of the stomach and duodenum. Normal appearance of small and large bowel. No evidence for bowel obstruction or inflammation. Appendix is not confidently identified. Lymphatic: No enlarged lymph nodes in the abdomen or pelvis. Reproductive: Prostate is unremarkable. Other: No free fluid. Left inguinal hernia containing fat. Negative for free air. Musculoskeletal: Stable disc space narrowing and sclerosis at L5-S1. Stable compression deformity involving the L1 vertebral body. No acute bone abnormality. Review of the MIP images confirms the above findings. IMPRESSION: 1. Stable postsurgical changes associated with treated Type A aortic dissection. The replaced ascending thoracic aorta, aortic stent graft and left subclavian stent are patent. The great vessels are patent. The dissection involving the left common carotid artery has resolved. 2. Stable appearance of the residual dissection involving the descending thoracic aorta and abdominal aorta. There is may be a small dissection involving the celiac trunk which has not significantly changed. 3. Main visceral arteries are widely patent. 4. No acute abnormality in the chest, abdomen or pelvis. Electronically Signed   By: Markus Daft M.D.   On: 05/04/2018 16:09    Dg Chest 2 View  Result Date:  07/14/2017 CLINICAL DATA:  Aortic dissection repair. EXAM: CHEST  2 VIEW COMPARISON:  CT 08/29/2016 FINDINGS: Median sternotomy. Thoracic aortic stent graft noted in stable the. Heart size normal. No focal infiltrate. No pleural effusion or pneumothorax. Surgical clips noted over the right upper chest. Improved left base subsegmental atelectasis with mild residual. Resolution of left pleural effusion. No pneumothorax . IMPRESSION: 1. Prior median sternotomy. Aortic stent graft. Aortic stent graft appears stable. Heart size stable. 2. Improved left base subsegmental atelectasis. Interim resolution of left pleural effusion. Electronically Signed   By: Marcello Moores  Register   On: 07/14/2017 14:14     Ct Coronary Morph W/cta Cor Nancy Fetter W/ca W/cm &/or Wo/cm  Addendum Date: 06/29/2017   ADDENDUM REPORT: 06/29/2017 14:03 CLINICAL DATA:  Post Type A Dissection EXAM: Cardiac CTA MEDICATIONS: Sub lingual nitro. 4mg  and lopressor 5mg  TECHNIQUE: The patient was scanned on a Siemens 539 slice scanner. Gantry rotation speed was 250 msecs. Collimation was . 6 mm . A 100 kV prospective scan was supposed to be triggered in the ascending aorta but appears to have started  early with ROI in SVC. The 3D data set was interpreted on a dedicated work station using MPR, MIP and VRT modes. A total of 80cc of contrast was used. FINDINGS: Non-cardiac: See separate report from [facility]. No significant findings on limited lung and soft tissue windows. Coronary Arteries: Right dominant with no anomalies LM:  Less than 30% calcified disease in distal vessel LAD:  Less than 50% calcified disease in proximal and mid vessel D1:  Less than 30% calcified disease in ostial vessel D2:  Less than 30% calcified disease in ostial vessel Circumflex: Appears anomalous from RCA Courses behind aorta with no significant stenosis OM1:  Normal RCA:  Less than 30% calcified disease in proximal and mid RCA PDA:  Normal PLA:  Normal IMPRESSION: 1) No  hemodynamically significant CAD. 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   On: 06/29/2017 14:03   Result Date: 06/29/2017 EXAM: OVER-READ INTERPRETATION  CT CHEST The following report is an over-read performed by radiologist Dr. Collene Leyden [facility] [place] on 06/29/2017. This over-read does not include interpretation of cardiac or coronary anatomy or pathology. The coronary CTA interpretation by the cardiologist is attached. COMPARISON:  CT 05/08/2017 FINDINGS: Vascular: Previously seen aortic dissection again noted in the descending thoracic aorta below the stent graft which extends from the distal aortic arch into the mid descending thoracic aorta prior tube graft repair of the ascending thoracic aorta without dissection. Dissection flap again noted within the left common carotid artery, no longer visualized in the brachiocephalic artery. Stents noted within the left subclavian artery. There is mild cardiomegaly. Mediastinum/Nodes: No mediastinal, hilar, or axillary adenopathy. Trachea and esophagus are unremarkable. Lungs/Pleura: Moderate left pleural effusion. Minimal compressive atelectasis in the lower lobe. No confluent airspace opacity otherwise. Upper Abdomen: Imaging into the upper abdomen shows no acute findings. Musculoskeletal: Chest wall soft tissues are unremarkable. No acute bony abnormality. IMPRESSION: Evidence of prior repair of the type day aortic dissection with tube graft repair in the ascending thoracic aorta and stent graft repair in the distal arch and descending thoracic aorta. The dissection flap again noted, extending from the distal aspect of the stent graft into the upper abdomen. Prior repair of dissection within the brachycephalic artery and stenting of the left subclavian artery. Dissection  flap continues to be noted in the left carotid artery. Moderate left pleural effusion with left lower lobe atelectasis. Electronically Signed: By: Rolm Baptise M.D. On: 06/29/2017 10:22     Recent Lab Findings: Lab Results  Component Value Date   WBC 6.9 06/01/2018   HGB 15.2 06/01/2018   HCT 44.0 06/01/2018   PLT 145.0 (L) 06/01/2018   GLUCOSE 100 (H) 05/02/2018   CHOL 140 05/02/2018   TRIG 87.0 05/02/2018   HDL 49.30 05/02/2018   LDLCALC 73 05/02/2018   ALT 13 05/02/2018   AST 22 05/02/2018   NA 139 05/02/2018   K 4.4 05/02/2018   CL 103 05/02/2018   CREATININE 1.20 05/02/2018   BUN 18 05/02/2018   CO2 28 05/02/2018   TSH 5.980 (H) 06/01/2017   INR 2.5 05/05/2020   HGBA1C 6.1 (H) 12/09/2015    Transthoracic Echocardiography 11/29/2019  . Left ventricular ejection fraction, by estimation, is 60 to 65%. The left ventricle has normal function. The left ventricle has no regional wall motion abnormalities. Left ventricular diastolic  parameters were normal. 2. Right ventricular systolic function is normal. The right ventricular size is normal. 3. Left atrial size was moderately dilated. 4. The mitral valve is normal in structure. No evidence of mitral valve regurgitation. No evidence of mitral stenosis. 5. The aortic valve is tricuspid. Aortic valve regurgitation is trivial. No aortic stenosis is present. 6. Post type A dissection with repair no residual intimal defect noted and normal aortic root size. 7. The inferior vena cava is normal in size with greater than 50% respiratory variability, suggesting right atrial pressure of 3 mmHg.    Assessment / Plan:   #1 status post reconstruction of the ascending aorta and arch with frozen elephant trunk secondary to acute ascending aortic dissection exactly 3 years ago today-  stable appearance of CTA of chest abdomen pelvis following type I aortic dissection May 08, 2017   Patient status post thrombosis of left axillary  vein, currently on anticoagulation, was found during his hospitalization to be HIT positive-patient remains on Coumadin  #2 bilateral popliteal aneurysms right larger than the left - he notes that he is to have follow-up appointment with Dr. Scot Dock with Doppler study to evaluate popliteal aneurysm.   Plan to see the patient back in 1 year with a CTA of the chest  [name] MD      [address] [place] [address] Office [phone]   Beeper [phone]  05/11/2020 8:50 PM

## 2020-05-14 ENCOUNTER — Telehealth: Payer: Self-pay | Admitting: Family Medicine

## 2020-05-14 DIAGNOSIS — E785 Hyperlipidemia, unspecified: Secondary | ICD-10-CM

## 2020-05-14 DIAGNOSIS — Z79899 Other long term (current) drug therapy: Secondary | ICD-10-CM

## 2020-05-14 NOTE — Telephone Encounter (Signed)
BB-What labs would you like to order on this young man? He said the [place] didn't draw them/If you advise me on what you would like ordered I would be happy to order them and call the patient to schedule/plz advise/thx dmf

## 2020-05-14 NOTE — Telephone Encounter (Signed)
Pt call and want dr.Burchette to put a order  In for labs ,he stated that he didn't get it done at the [place] and want a call back when set up.

## 2020-05-14 NOTE — Telephone Encounter (Signed)
Thanks!   Would get lipid, CMP, CBC (dxes= hyperlipidemia, high risk med use)

## 2020-05-15 NOTE — Telephone Encounter (Signed)
Created future orders per PCP for lab visit/thx dmf

## 2020-05-18 ENCOUNTER — Other Ambulatory Visit: Payer: Self-pay | Admitting: Family Medicine

## 2020-05-21 ENCOUNTER — Encounter: Payer: Self-pay | Admitting: Family Medicine

## 2020-05-22 ENCOUNTER — Other Ambulatory Visit: Payer: Self-pay

## 2020-05-22 ENCOUNTER — Other Ambulatory Visit (INDEPENDENT_AMBULATORY_CARE_PROVIDER_SITE_OTHER): Payer: Medicare HMO

## 2020-05-22 ENCOUNTER — Other Ambulatory Visit: Payer: Self-pay | Admitting: Family Medicine

## 2020-05-22 DIAGNOSIS — E785 Hyperlipidemia, unspecified: Secondary | ICD-10-CM | POA: Diagnosis not present

## 2020-05-22 DIAGNOSIS — Z79899 Other long term (current) drug therapy: Secondary | ICD-10-CM | POA: Diagnosis not present

## 2020-05-23 ENCOUNTER — Encounter: Payer: Self-pay | Admitting: Family Medicine

## 2020-05-23 LAB — LIPID PANEL
Cholesterol: 145 mg/dL (ref <200)
HDL: 49 mg/dL (ref >=40)
LDL Cholesterol (Calc): 76 mg/dL (calc)
Non-HDL Cholesterol (Calc): 96 mg/dL (calc) (ref <130)
Total CHOL/HDL Ratio: 3 (calc) (ref <5.0)
Triglycerides: 117 mg/dL (ref <150)

## 2020-05-23 LAB — CBC WITH DIFFERENTIAL/PLATELET
Absolute Monocytes: 550 cells/uL (ref 200–950)
Basophils Absolute: 38 cells/uL (ref 0–200)
Basophils Relative: 0.6 %
Eosinophils Absolute: 166 cells/uL (ref 15–500)
Eosinophils Relative: 2.6 %
HCT: 44.7 % (ref 38.5–50.0)
Hemoglobin: 15.2 g/dL (ref 13.2–17.1)
Lymphs Abs: 3149 cells/uL (ref 850–3900)
MCH: 32 pg (ref 27.0–33.0)
MCHC: 34 g/dL (ref 32.0–36.0)
MCV: 94.1 fL (ref 80.0–100.0)
MPV: 12.3 fL (ref 7.5–12.5)
Monocytes Relative: 8.6 %
Neutro Abs: 2496 cells/uL (ref 1500–7800)
Neutrophils Relative %: 39 %
Platelets: 110 10*3/uL — ABNORMAL LOW (ref 140–400)
RBC: 4.75 10*6/uL (ref 4.20–5.80)
RDW: 13.3 % (ref 11.0–15.0)
Total Lymphocyte: 49.2 %
WBC: 6.4 10*3/uL (ref 3.8–10.8)

## 2020-05-23 LAB — COMPREHENSIVE METABOLIC PANEL
AG Ratio: 1.5 (calc) (ref 1.0–2.5)
ALT: 15 U/L (ref 9–46)
AST: 23 U/L (ref 10–35)
Albumin: 4 g/dL (ref 3.6–5.1)
Alkaline phosphatase (APISO): 45 U/L (ref 35–144)
BUN: 19 mg/dL (ref 7–25)
CO2: 27 mmol/L (ref 20–32)
Calcium: 9 mg/dL (ref 8.6–10.3)
Chloride: 105 mmol/L (ref 98–110)
Creat: 1.1 mg/dL (ref 0.70–1.18)
Globulin: 2.7 g/dL (calc) (ref 1.9–3.7)
Glucose, Bld: 98 mg/dL (ref 65–99)
Potassium: 4.3 mmol/L (ref 3.5–5.3)
Sodium: 141 mmol/L (ref 135–146)
Total Bilirubin: 0.7 mg/dL (ref 0.2–1.2)
Total Protein: 6.7 g/dL (ref 6.1–8.1)

## 2020-06-16 ENCOUNTER — Ambulatory Visit: Payer: Medicare HMO

## 2020-06-17 ENCOUNTER — Telehealth: Payer: Self-pay

## 2020-06-17 NOTE — Telephone Encounter (Signed)
Patient called in to get appt rsch with [facility]. Let him know that I wasn't able to sch her appt she would call him back to get him rsch    Please call and advise

## 2020-06-18 ENCOUNTER — Ambulatory Visit (INDEPENDENT_AMBULATORY_CARE_PROVIDER_SITE_OTHER): Payer: Medicare HMO | Admitting: General Practice

## 2020-06-18 ENCOUNTER — Other Ambulatory Visit: Payer: Self-pay

## 2020-06-18 DIAGNOSIS — Z9889 Other specified postprocedural states: Secondary | ICD-10-CM

## 2020-06-18 DIAGNOSIS — I4891 Unspecified atrial fibrillation: Secondary | ICD-10-CM | POA: Diagnosis not present

## 2020-06-18 DIAGNOSIS — Z7901 Long term (current) use of anticoagulants: Secondary | ICD-10-CM

## 2020-06-18 LAB — POCT INR: INR: 2.6 (ref 2.0–3.0)

## 2020-06-18 NOTE — Patient Instructions (Signed)
Pre visit review using our clinic review tool, if applicable. No additional management support is needed unless otherwise documented below in the visit note.  Continue taking 5mg daily except 7.5mg on Tuesdays and Saturdays. Recheck in 6 weeks. 

## 2020-06-19 ENCOUNTER — Other Ambulatory Visit: Payer: Self-pay | Admitting: Family Medicine

## 2020-06-19 DIAGNOSIS — Z7901 Long term (current) use of anticoagulants: Secondary | ICD-10-CM

## 2020-06-19 DIAGNOSIS — I480 Paroxysmal atrial fibrillation: Secondary | ICD-10-CM

## 2020-06-26 ENCOUNTER — Other Ambulatory Visit: Payer: Self-pay | Admitting: Family Medicine

## 2020-07-21 ENCOUNTER — Ambulatory Visit: Payer: Medicare HMO

## 2020-07-22 ENCOUNTER — Telehealth: Payer: Self-pay | Admitting: Family Medicine

## 2020-07-22 NOTE — Telephone Encounter (Signed)
Left message for patient to call back and schedule Medicare Annual Wellness Visit (AWV) either virtually or in office.   Last AWV 12/16/10  please schedule at anytime with [facility] Nurse Health Advisor 1 or 2   This should be a 45 minute visit.

## 2020-07-23 NOTE — Telephone Encounter (Signed)
Pt called back stating that he is in [place] and do not know at this time what time he will be coming back in town but when he does he will call the office to get scheduled.

## 2020-08-16 DIAGNOSIS — Z20822 Contact with and (suspected) exposure to covid-19: Secondary | ICD-10-CM | POA: Diagnosis not present

## 2020-09-10 ENCOUNTER — Ambulatory Visit (INDEPENDENT_AMBULATORY_CARE_PROVIDER_SITE_OTHER): Payer: Medicare HMO | Admitting: General Practice

## 2020-09-10 ENCOUNTER — Other Ambulatory Visit: Payer: Self-pay

## 2020-09-10 DIAGNOSIS — I4891 Unspecified atrial fibrillation: Secondary | ICD-10-CM

## 2020-09-10 DIAGNOSIS — Z7901 Long term (current) use of anticoagulants: Secondary | ICD-10-CM

## 2020-09-10 DIAGNOSIS — Z9889 Other specified postprocedural states: Secondary | ICD-10-CM

## 2020-09-10 LAB — POCT INR: INR: 2.5 (ref 2.0–3.0)

## 2020-09-10 NOTE — Patient Instructions (Addendum)
Pre visit review using our clinic review tool, if applicable. No additional management support is needed unless otherwise documented below in the visit note.  Continue taking 5mg daily except 7.5mg on Tuesdays and Saturdays. Recheck in 6 weeks. 

## 2020-09-26 DIAGNOSIS — S61219A Laceration without foreign body of unspecified finger without damage to nail, initial encounter: Secondary | ICD-10-CM | POA: Diagnosis not present

## 2020-09-26 DIAGNOSIS — Z23 Encounter for immunization: Secondary | ICD-10-CM | POA: Diagnosis not present

## 2020-09-26 DIAGNOSIS — L539 Erythematous condition, unspecified: Secondary | ICD-10-CM | POA: Diagnosis not present

## 2020-09-26 DIAGNOSIS — R229 Localized swelling, mass and lump, unspecified: Secondary | ICD-10-CM | POA: Diagnosis not present

## 2020-10-06 DIAGNOSIS — S61219S Laceration without foreign body of unspecified finger without damage to nail, sequela: Secondary | ICD-10-CM | POA: Diagnosis not present

## 2020-10-17 NOTE — Progress Notes (Unsigned)
Subjective:   Don Medina is a 80 y.o. male who presents for Medicare Annual/Subsequent preventive examination.  Review of Systems    N/A        Objective:    There were no vitals filed for this visit. There is no height or weight on file to calculate BMI.  Advanced Directives 06/06/2019 09/17/2017 08/11/2017 08/10/2017 05/13/2017 05/07/2017 12/04/2015  Does Patient Have a Medical Advance Directive? Yes Yes Yes Yes Yes Yes No  Type of Paramedic of [place];Living will [place];Living will - - Special educational needs teacher of [place];Living will;Out of facility DNR (pink MOST or yellow form) -  Does patient want to make changes to medical advance directive? No - Patient declined No - Patient declined No - Patient declined - No - Patient declined - -  Copy of [place] in Chart? - No - copy requested - - No - copy requested No - copy requested -  Would patient like information on creating a medical advance directive? - - - - No - Patient declined No - Patient declined No - patient declined information    Current Medications (verified) Outpatient Encounter Medications as of 10/20/2020  Medication Sig  . aspirin 81 MG tablet Take 1 tablet (81 mg total) by mouth daily.  . citalopram (CELEXA) 20 MG tablet TAKE 1 TABLET BY MOUTH EVERY DAY  . metoprolol tartrate (LOPRESSOR) 25 MG tablet TAKE 1/2 TABLET BY MOUTH TWICE A DAY  . Misc Natural Products (GLUCOS-CHONDROIT-MSM COMPLEX) TABS Take 1 tablet by mouth 3 (three) times daily.  . Omega-3 Fatty Acids (FISH OIL) 1000 MG CAPS Take 1 capsule by mouth daily.  . simvastatin (ZOCOR) 20 MG tablet TAKE 1 TABLET BY MOUTH AT BEDTIME**NEED OFFICE VISIT**  . warfarin (COUMADIN) 2.5 MG tablet TAKE 2 TABLETS BY MOUTH DAILY EXCEPT TAKE 3 TABS ON TUESDAYS AND SAT OR TAKE AS DIRECTED PER COUMADIN CLINIC   No facility-administered encounter medications on file as of  10/20/2020.    Allergies (verified) Heparin and Quinolones   History: Past Medical History:  Diagnosis Date  . 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 Arthritis   . Atrial fibrillation ([place]) [I48.91] 05/23/2017  . Blood in the stool   . BRADYCARDIA 03/10/2010   Qualifier: Diagnosis of  By: Elease Hashimoto MD, Bruce    . CHEST PAIN 03/19/2010   Qualifier: Diagnosis of  By: Allean Found, LPN, Christine    . Chicken pox   . COLONIC POLYPS, HX OF 01/13/2009   Qualifier: Diagnosis of  By: Valma Cava LPN, Izora Gala    . Episode of generalized weakness 02/17/2016  . GERD (gastroesophageal reflux disease) 10/03/2012  . Hay fever   . History of colonic polyps   . History of colonoscopy   . Hyperlipidemia   . MUSCLE STRAIN, HAMSTRING MUSCLE 09/08/2009   Qualifier: Diagnosis of  By: Elease Hashimoto MD, Bruce    . ORTHOSTATIC DIZZINESS 03/05/2010   Qualifier: Diagnosis of  By: Elease Hashimoto MD, Bruce    . Other premature beats 03/17/2010   Qualifier: Diagnosis of  By: Lovena Le, MD, Martyn Malay   . Premature ventricular contraction 01/05/2011  . S/P aortic dissection repair 05/08/2017  . THROMBOCYTOPENIA 03/05/2010   Qualifier: Diagnosis of  By: Elease Hashimoto MD, Bruce    . Weakness 12/09/2015   Past Surgical History:  Procedure Laterality Date  . ABDOMINAL  ADHESION SURGERY  2007  . AORTIC INTERVENTION N/A 05/08/2017   Procedure: HYPOTHERMIC CIRCULATORY ARREST;  Surgeon: [name], MD;  Location: [facility];  Service: Open Heart Surgery;  Laterality: N/A;  . ASCENDING AORTIC ROOT REPLACEMENT N/A 05/08/2017   Procedure: REPLACEMENT OF ASCENDING AORTIC ARCH;  Surgeon: [name], MD;  Location: [facility];  Service: Open Heart Surgery;  Laterality: N/A;  . CANNULATION FOR CARDIOPULMONARY BYPASS Right 05/08/2017   Procedure: AXILLARY CANNULATION;  Surgeon: [name], MD;  Location: [facility];  Service: Open Heart Surgery;  Laterality: Right;  .  ENDOVASCULAR REPAIR/STENT GRAFT  05/08/2017   Procedure: STENT OF LEFT SUBCLAVIAN ARTERY with 10x39VBX;  Surgeon: [name], MD;  Location: [facility];  Service: Open Heart Surgery;;  . herniated diks surgery L-5  1996  . herniated disk surgery c 6-7  1986  . INTRAOPERATIVE TRANSESOPHAGEAL ECHOCARDIOGRAM N/A 05/08/2017   Procedure: INTRAOPERATIVE TRANSESOPHAGEAL ECHOCARDIOGRAM;  Surgeon: [name], MD;  [facility];  Service: Open Heart Surgery;  Laterality: N/A;  . REPLACEMENT ASCENDING AORTA N/A 05/08/2017   Procedure: REPLACEMENT ASCENDING AORTA;  Surgeon: [name], MD;  Location: [facility];  Service: Open Heart Surgery;  Laterality: N/A;  . THORACIC AORTIC ENDOVASCULAR STENT GRAFT  05/08/2017   Procedure: STENT GRAFTING OF DESCENDING THORACIC AORTA;  Surgeon: [name], MD;  Location: [facility];  Service: Open Heart Surgery;;   Family History  Problem Relation Age of Onset  . Alzheimer's disease Father 33  . Dementia Father   . Stroke Mother 94  . Cancer Brother        Leukemia  . Sudden death Other        family  . Hypertension Other   . Heart disease Neg Hx    Social History   Socioeconomic History  . Marital status: Married    Spouse name: Not on file  . Number of children: Not on file  . Years of education: Not on file  . Highest education level: Not on file  Occupational History  . Occupation: Retired  Tobacco Use  . Smoking status: Former Smoker    Quit date: 08/09/1974    Years since quitting: 46.2  . Smokeless tobacco: Never Used  Vaping Use  . Vaping Use: Never used  Substance and Sexual Activity  . Alcohol use: No  . Drug use: No  . Sexual activity: Not Currently  Other Topics Concern  . Not on file  Social History Narrative  . Not on file   Social Determinants of Health   Financial Resource Strain: Not on file  Food Insecurity: Not on file  Transportation Needs: Not on file  Physical Activity: Not on file  Stress: Not on file   Social Connections: Not on file    Tobacco Counseling Counseling given: Not Answered   Clinical Intake:                 Diabetic?No         Activities of Daily Living No flowsheet data found.  Patient Care Team: Eulas Post, MD as PCP - General Josue Hector, MD as PCP - Cardiology (Cardiology)  Indicate any recent Medical Services you may have received from other than [facility] providers in the past year (date may be approximate).     Assessment:   This is a routine wellness examination for Don Medina.  Hearing/Vision screen No exam data present  Dietary issues and exercise activities discussed:  Goals   None    Depression Screen PHQ 2/9 Scores 08/15/2017 08/30/2013  PHQ - 2 Score 0 0    Fall Risk Fall Risk  08/11/2017 02/17/2016 12/09/2015 08/30/2013  Falls in the past year? No No No No    FALL RISK PREVENTION PERTAINING TO THE HOME:  Any stairs in or around the home? {YES/NO:21197} If so, are there any without handrails? No  Home free of loose throw rugs in walkways, pet beds, electrical cords, etc? Yes  Adequate lighting in your home to reduce risk of falls? Yes   ASSISTIVE DEVICES UTILIZED TO PREVENT FALLS:  Life alert? {YES/NO:21197} Use of a cane, walker or w/c? {YES/NO:21197} Grab bars in the bathroom? {YES/NO:21197} Shower chair or bench in shower? {YES/NO:21197} Elevated toilet seat or a handicapped toilet? {YES/NO:21197}  TIMED UP AND GO:  Was the test performed? Yes .  Length of time to ambulate 10 feet: *** sec.   {Appearance of OTLX:7262035}  Cognitive Function:        Immunizations Immunization History  Administered Date(s) Administered  . Fluad Quad(high Dose 65+) 05/11/2019  . Influenza Split 05/26/2011, 05/18/2012, 06/28/2013  . Influenza, High Dose Seasonal PF 05/06/2014, 05/03/2015, 05/15/2017, 04/29/2020  . Influenza,inj,Quad PF,6+ Mos 06/09/2014  . Influenza-Unspecified 05/14/2016  . PFIZER(Purple  Top)SARS-COV-2 Vaccination 08/19/2019, 03/18/2020  . Pneumococcal Conjugate-13 08/30/2013  . Pneumococcal Polysaccharide-23 08/09/2006, 10/03/2012  . Td 08/09/1998  . Tdap 12/16/2010  . Zoster 05/17/2006    TDAP status: Up to date  Flu Vaccine status: Up to date  Pneumococcal vaccine status: Up to date  Covid-19 vaccine status: Completed vaccines  Qualifies for Shingles Vaccine? Yes   Zostavax completed Yes   Shingrix Completed?: No.    Education has been provided regarding the importance of this vaccine. Patient has been advised to call insurance company to determine out of pocket expense if they have not yet received this vaccine. Advised may also receive vaccine at local pharmacy or Health Dept. Verbalized acceptance and understanding.  Screening Tests Health Maintenance  Topic Date Due  . Hepatitis C Screening  Never done  . COVID-19 Vaccine (3 - Booster for Pfizer series) 09/18/2020  . TETANUS/TDAP  12/15/2020  . PNA vac Low Risk Adult  Completed  . HPV VACCINES  Aged Out  . INFLUENZA VACCINE  Discontinued    Health Maintenance  Health Maintenance Due  Topic Date Due  . Hepatitis C Screening  Never done  . COVID-19 Vaccine (3 - Booster for Pfizer series) 09/18/2020    Colorectal cancer screening: No longer required.   Lung Cancer Screening: (Low Dose CT Chest recommended if Age 21-80 years, 30 pack-year currently smoking OR have quit w/in 15years.) does not qualify.   Lung Cancer Screening Referral: N/A   Additional Screening:  Hepatitis C Screening: does qualify;   Vision Screening: Recommended annual ophthalmology exams for early detection of glaucoma and other disorders of the eye. Is the patient up to date with their annual eye exam?  {YES/NO:21197} Who is the provider or what is the name of the office in which the patient attends annual eye exams? *** If pt is not established with a provider, would they like to be referred to a provider to establish care?  {YES/NO:21197}.   Dental Screening: Recommended annual dental exams for proper oral hygiene  Community Resource Referral / Chronic Care Management: CRR required this visit?  No   CCM required this visit?  No      Plan:  I have personally reviewed and noted the following in the patient's chart:   . Medical and social history . Use of alcohol, tobacco or illicit drugs  . Current medications and supplements . Functional ability and status . Nutritional status . Physical activity . Advanced directives . List of other physicians . Hospitalizations, surgeries, and ER visits in previous 12 months . Vitals . Screenings to include cognitive, depression, and falls . Referrals and appointments  In addition, I have reviewed and discussed with patient certain preventive protocols, quality metrics, and best practice recommendations. A written personalized care plan for preventive services as well as general preventive health recommendations were provided to patient.     Ofilia Neas, LPN   0/32/1224   Nurse Notes: None

## 2020-10-18 ENCOUNTER — Other Ambulatory Visit: Payer: Self-pay | Admitting: Cardiovascular Disease

## 2020-10-20 ENCOUNTER — Ambulatory Visit: Payer: Medicare HMO

## 2020-10-20 DIAGNOSIS — Z0000 Encounter for general adult medical examination without abnormal findings: Secondary | ICD-10-CM

## 2020-10-22 ENCOUNTER — Other Ambulatory Visit: Payer: Self-pay

## 2020-10-22 ENCOUNTER — Ambulatory Visit (INDEPENDENT_AMBULATORY_CARE_PROVIDER_SITE_OTHER): Payer: Medicare HMO | Admitting: General Practice

## 2020-10-22 DIAGNOSIS — I4891 Unspecified atrial fibrillation: Secondary | ICD-10-CM | POA: Diagnosis not present

## 2020-10-22 DIAGNOSIS — Z7901 Long term (current) use of anticoagulants: Secondary | ICD-10-CM

## 2020-10-22 DIAGNOSIS — Z9889 Other specified postprocedural states: Secondary | ICD-10-CM

## 2020-10-22 LAB — POCT INR: INR: 2.8 (ref 2.0–3.0)

## 2020-10-22 NOTE — Patient Instructions (Addendum)
Pre visit review using our clinic review tool, if applicable. No additional management support is needed unless otherwise documented below in the visit note.  Continue taking 5mg daily except 7.5mg on Tuesdays and Saturdays. Recheck in 6 weeks. 

## 2020-11-07 ENCOUNTER — Ambulatory Visit: Payer: Medicare HMO

## 2020-11-23 ENCOUNTER — Other Ambulatory Visit: Payer: Self-pay | Admitting: Family Medicine

## 2020-11-24 ENCOUNTER — Ambulatory Visit: Payer: Medicare HMO

## 2020-11-27 ENCOUNTER — Other Ambulatory Visit: Payer: Self-pay | Admitting: Cardiovascular Disease

## 2020-12-01 ENCOUNTER — Ambulatory Visit (INDEPENDENT_AMBULATORY_CARE_PROVIDER_SITE_OTHER): Payer: Medicare HMO | Admitting: General Practice

## 2020-12-01 ENCOUNTER — Other Ambulatory Visit: Payer: Self-pay

## 2020-12-01 DIAGNOSIS — Z7901 Long term (current) use of anticoagulants: Secondary | ICD-10-CM

## 2020-12-01 DIAGNOSIS — L570 Actinic keratosis: Secondary | ICD-10-CM | POA: Diagnosis not present

## 2020-12-01 DIAGNOSIS — L821 Other seborrheic keratosis: Secondary | ICD-10-CM | POA: Diagnosis not present

## 2020-12-01 DIAGNOSIS — I4891 Unspecified atrial fibrillation: Secondary | ICD-10-CM

## 2020-12-01 DIAGNOSIS — C44519 Basal cell carcinoma of skin of other part of trunk: Secondary | ICD-10-CM | POA: Diagnosis not present

## 2020-12-01 DIAGNOSIS — L814 Other melanin hyperpigmentation: Secondary | ICD-10-CM | POA: Diagnosis not present

## 2020-12-01 DIAGNOSIS — Z9889 Other specified postprocedural states: Secondary | ICD-10-CM

## 2020-12-01 DIAGNOSIS — D2261 Melanocytic nevi of right upper limb, including shoulder: Secondary | ICD-10-CM | POA: Diagnosis not present

## 2020-12-01 DIAGNOSIS — D225 Melanocytic nevi of trunk: Secondary | ICD-10-CM | POA: Diagnosis not present

## 2020-12-01 LAB — POCT INR: INR: 1.8 — AB (ref 2.0–3.0)

## 2020-12-01 NOTE — Patient Instructions (Addendum)
Pre visit review using our clinic review tool, if applicable. No additional management support is needed unless otherwise documented below in the visit note.  Take 3 tablets today and then continue taking 5mg  daily except 7.5mg  on Tuesdays and Saturdays. Recheck in 6 weeks.

## 2020-12-03 ENCOUNTER — Ambulatory Visit (INDEPENDENT_AMBULATORY_CARE_PROVIDER_SITE_OTHER): Payer: Medicare HMO

## 2020-12-03 DIAGNOSIS — Z1211 Encounter for screening for malignant neoplasm of colon: Secondary | ICD-10-CM | POA: Diagnosis not present

## 2020-12-03 DIAGNOSIS — Z0000 Encounter for general adult medical examination without abnormal findings: Secondary | ICD-10-CM

## 2020-12-03 NOTE — Patient Instructions (Addendum)
Don Medina , Thank you for taking time to come for your Medicare Wellness Visit. I appreciate your ongoing commitment to your health goals. Please review the following plan we discussed and let me know if I can assist you in the future.   Screening recommendations/referrals: Colonoscopy: referral completed per pt requested  Recommended yearly ophthalmology/optometry visit for glaucoma screening and checkup Recommended yearly dental visit for hygiene and checkup  Vaccinations: Influenza vaccine: current due in fall  Pneumococcal vaccine: completed the series  Tdap vaccine: current 10/2020 Shingles vaccine: completed series   Advanced directives: will provide copies   Conditions/risks identified: none   Next appointment: none   Preventive Care 60 Years and Older, Male Preventive care refers to lifestyle choices and visits with your health care provider that can promote health and wellness. What does preventive care include?  A yearly physical exam. This is also called an annual well check.  Dental exams once or twice a year.  Routine eye exams. Ask your health care provider how often you should have your eyes checked.  Personal lifestyle choices, including:  Daily care of your teeth and gums.  Regular physical activity.  Eating a healthy diet.  Avoiding tobacco and drug use.  Limiting alcohol use.  Practicing safe sex.  Taking low doses of aspirin every day.  Taking vitamin and mineral supplements as recommended by your health care provider. What happens during an annual well check? The services and screenings done by your health care provider during your annual well check will depend on your age, overall health, lifestyle risk factors, and family history of disease. Counseling  Your health care provider may ask you questions about your:  Alcohol use.  Tobacco use.  Drug use.  Emotional well-being.  Home and relationship well-being.  Sexual  activity.  Eating habits.  History of falls.  Memory and ability to understand (cognition).  Work and work Statistician. Screening  You may have the following tests or measurements:  Height, weight, and BMI.  Blood pressure.  Lipid and cholesterol levels. These may be checked every 5 years, or more frequently if you are over 51 years old.  Skin check.  Lung cancer screening. You may have this screening every year starting at age 64 if you have a 30-pack-year history of smoking and currently smoke or have quit within the past 15 years.  Fecal occult blood test (FOBT) of the stool. You may have this test every year starting at age 1.  Flexible sigmoidoscopy or colonoscopy. You may have a sigmoidoscopy every 5 years or a colonoscopy every 10 years starting at age 65.  Prostate cancer screening. Recommendations will vary depending on your family history and other risks.  Hepatitis C blood test.  Hepatitis B blood test.  Sexually transmitted disease (STD) testing.  Diabetes screening. This is done by checking your blood sugar (glucose) after you have not eaten for a while (fasting). You may have this done every 1-3 years.  Abdominal aortic aneurysm (AAA) screening. You may need this if you are a current or former smoker.  Osteoporosis. You may be screened starting at age 19 if you are at high risk. Talk with your health care provider about your test results, treatment options, and if necessary, the need for more tests. Vaccines  Your health care provider may recommend certain vaccines, such as:  Influenza vaccine. This is recommended every year.  Tetanus, diphtheria, and acellular pertussis (Tdap, Td) vaccine. You may need a Td booster every 10 years.  Zoster vaccine. You may need this after age 56.  Pneumococcal 13-valent conjugate (PCV13) vaccine. One dose is recommended after age 76.  Pneumococcal polysaccharide (PPSV23) vaccine. One dose is recommended after age  43. Talk to your health care provider about which screenings and vaccines you need and how often you need them. This information is not intended to replace advice given to you by your health care provider. Make sure you discuss any questions you have with your health care provider. Document Released: 08/22/2015 Document Revised: 04/14/2016 Document Reviewed: 05/27/2015 Elsevier Interactive Patient Education  2017 [employer] Prevention in the Home Falls can cause injuries. They can happen to people of all ages. There are many things you can do to make your home safe and to help prevent falls. What can I do on the outside of my home?  Regularly fix the edges of walkways and driveways and fix any cracks.  Remove anything that might make you trip as you walk through a door, such as a raised step or threshold.  Trim any bushes or trees on the path to your home.  Use bright outdoor lighting.  Clear any walking paths of anything that might make someone trip, such as rocks or tools.  Regularly check to see if handrails are loose or broken. Make sure that both sides of any steps have handrails.  Any raised decks and porches should have guardrails on the edges.  Have any leaves, snow, or ice cleared regularly.  Use sand or salt on walking paths during winter.  Clean up any spills in your garage right away. This includes oil or grease spills. What can I do in the bathroom?  Use night lights.  Install grab bars by the toilet and in the tub and shower. Do not use towel bars as grab bars.  Use non-skid mats or decals in the tub or shower.  If you need to sit down in the shower, use a plastic, non-slip stool.  Keep the floor dry. Clean up any water that spills on the floor as soon as it happens.  Remove soap buildup in the tub or shower regularly.  Attach bath mats securely with double-sided non-slip rug tape.  Do not have throw rugs and other things on the floor that can make  you trip. What can I do in the bedroom?  Use night lights.  Make sure that you have a light by your bed that is easy to reach.  Do not use any sheets or blankets that are too big for your bed. They should not hang down onto the floor.  Have a firm chair that has side arms. You can use this for support while you get dressed.  Do not have throw rugs and other things on the floor that can make you trip. What can I do in the kitchen?  Clean up any spills right away.  Avoid walking on wet floors.  Keep items that you use a lot in easy-to-reach places.  If you need to reach something above you, use a strong step stool that has a grab bar.  Keep electrical cords out of the way.  Do not use floor polish or wax that makes floors slippery. If you must use wax, use non-skid floor wax.  Do not have throw rugs and other things on the floor that can make you trip. What can I do with my stairs?  Do not leave any items on the stairs.  Make sure that there are  handrails on both sides of the stairs and use them. Fix handrails that are broken or loose. Make sure that handrails are as long as the stairways.  Check any carpeting to make sure that it is firmly attached to the stairs. Fix any carpet that is loose or worn.  Avoid having throw rugs at the top or bottom of the stairs. If you do have throw rugs, attach them to the floor with carpet tape.  Make sure that you have a light switch at the top of the stairs and the bottom of the stairs. If you do not have them, ask someone to add them for you. What else can I do to help prevent falls?  Wear shoes that:  Do not have high heels.  Have rubber bottoms.  Are comfortable and fit you well.  Are closed at the toe. Do not wear sandals.  If you use a stepladder:  Make sure that it is fully opened. Do not climb a closed stepladder.  Make sure that both sides of the stepladder are locked into place.  Ask someone to hold it for you, if  possible.  Clearly mark and make sure that you can see:  Any grab bars or handrails.  First and last steps.  Where the edge of each step is.  Use tools that help you move around (mobility aids) if they are needed. These include:  Canes.  Walkers.  Scooters.  Crutches.  Turn on the lights when you go into a dark area. Replace any light bulbs as soon as they burn out.  Set up your furniture so you have a clear path. Avoid moving your furniture around.  If any of your floors are uneven, fix them.  If there are any pets around you, be aware of where they are.  Review your medicines with your doctor. Some medicines can make you feel dizzy. This can increase your chance of falling. Ask your doctor what other things that you can do to help prevent falls. This information is not intended to replace advice given to you by your health care provider. Make sure you discuss any questions you have with your health care provider. Document Released: 05/22/2009 Document Revised: 01/01/2016 Document Reviewed: 08/30/2014 Elsevier Interactive Patient Education  2017 [employer].

## 2020-12-03 NOTE — Progress Notes (Signed)
Subjective:   Don Medina is a 80 y.o. male who presents for Medicare Annual/Subsequent preventive examination.  Virtual Visit via Video Note  I connected with Don Medina by a video enabled telemedicine application and verified that I am speaking with the correct person using two identifiers.  Location: Patient: Home Provider: Office Persons participating in the virtual visit: patient, provider   I discussed the limitations of evaluation and management by telemedicine and the availability of in person appointments. The patient expressed understanding and agreed to proceed.     Mickel Baas [address] [place],LPN   Review of Systems    N/A       Objective:    There were no vitals filed for this visit. There is no height or weight on file to calculate BMI.  Advanced Directives 06/06/2019 09/17/2017 08/11/2017 08/10/2017 05/13/2017 05/07/2017 12/04/2015  Does Patient Have a Medical Advance Directive? Yes Yes Yes Yes Yes Yes No  Type of Paramedic of [place];Living will [place];Living will - - Special educational needs teacher of [place];Living will;Out of facility DNR (pink MOST or yellow form) -  Does patient want to make changes to medical advance directive? No - Patient declined No - Patient declined No - Patient declined - No - Patient declined - -  Copy of [place] in Chart? - No - copy requested - - No - copy requested No - copy requested -  Would patient like information on creating a medical advance directive? - - - - No - Patient declined No - Patient declined No - patient declined information    Current Medications (verified) Outpatient Encounter Medications as of 12/03/2020  Medication Sig  . aspirin 81 MG tablet Take 1 tablet (81 mg total) by mouth daily.  . citalopram (CELEXA) 20 MG tablet TAKE 1 TABLET BY MOUTH EVERY DAY  . metoprolol tartrate (LOPRESSOR) 25 MG tablet TAKE 1/2 TABLTE BY MOUTH  TWICE A DAY . PT NEEDS FOLLOW UP  . Misc Natural Products (GLUCOS-CHONDROIT-MSM COMPLEX) TABS Take 1 tablet by mouth 3 (three) times daily.  . Omega-3 Fatty Acids (FISH OIL) 1000 MG CAPS Take 1 capsule by mouth daily.  . simvastatin (ZOCOR) 20 MG tablet TAKE 1 TABLET BY MOUTH AT BEDTIME**NEED OFFICE VISIT**  . warfarin (COUMADIN) 2.5 MG tablet TAKE 2 TABLETS BY MOUTH DAILY EXCEPT TAKE 3 TABS ON TUESDAYS AND SAT OR TAKE AS DIRECTED PER COUMADIN CLINIC   No facility-administered encounter medications on file as of 12/03/2020.    Allergies (verified) Heparin and Quinolones   History: Past Medical History:  Diagnosis Date  . 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 Arthritis   . Atrial fibrillation ([place]) [I48.91] 05/23/2017  . Blood in the stool   . BRADYCARDIA 03/10/2010   Qualifier: Diagnosis of  By: Elease Hashimoto MD, Bruce    . CHEST PAIN 03/19/2010   Qualifier: Diagnosis of  By: Allean Found, LPN, Christine    . Chicken pox   . COLONIC POLYPS, HX OF 01/13/2009   Qualifier: Diagnosis of  By: Valma Cava LPN, Izora Gala    . Episode of generalized weakness 02/17/2016  . GERD (gastroesophageal reflux disease) 10/03/2012  . Hay fever   . History of colonic polyps   . History of colonoscopy   . Hyperlipidemia   . MUSCLE STRAIN, HAMSTRING MUSCLE 09/08/2009   Qualifier: Diagnosis of  By: Elease Hashimoto MD, Bruce    .  ORTHOSTATIC DIZZINESS 03/05/2010   Qualifier: Diagnosis of  By: Elease Hashimoto MD, Bruce    . Other premature beats 03/17/2010   Qualifier: Diagnosis of  By: Lovena Le, MD, Martyn Malay   . Premature ventricular contraction 01/05/2011  . S/P aortic dissection repair 05/08/2017  . THROMBOCYTOPENIA 03/05/2010   Qualifier: Diagnosis of  By: Elease Hashimoto MD, Bruce    . Weakness 12/09/2015   Past Surgical History:  Procedure Laterality Date  . ABDOMINAL ADHESION SURGERY  2007  . AORTIC INTERVENTION N/A 05/08/2017   Procedure: HYPOTHERMIC  CIRCULATORY ARREST;  Surgeon: [name], MD;  Location: [place];  Service: Open Heart Surgery;  Laterality: N/A;  . ASCENDING AORTIC ROOT REPLACEMENT N/A 05/08/2017   Procedure: REPLACEMENT OF ASCENDING AORTIC ARCH;  Surgeon: [name], MD;  Location: [place];  Service: Open Heart Surgery;  Laterality: N/A;  . CANNULATION FOR CARDIOPULMONARY BYPASS Right 05/08/2017   Procedure: AXILLARY CANNULATION;  Surgeon: [name], MD;  Location: [place];  Service: Open Heart Surgery;  Laterality: Right;  . ENDOVASCULAR REPAIR/STENT GRAFT  05/08/2017   Procedure: STENT OF LEFT SUBCLAVIAN ARTERY with 10x39VBX;  Surgeon: [name], MD;  Location: [place];  Service: Open Heart Surgery;;  . herniated diks surgery L-5  1996  . herniated disk surgery c 6-7  1986  . INTRAOPERATIVE TRANSESOPHAGEAL ECHOCARDIOGRAM N/A 05/08/2017   Procedure: INTRAOPERATIVE TRANSESOPHAGEAL ECHOCARDIOGRAM;  Surgeon: [name], MD;  [facility];  Service: Open Heart Surgery;  Laterality: N/A;  . REPLACEMENT ASCENDING AORTA N/A 05/08/2017   Procedure: REPLACEMENT ASCENDING AORTA;  Surgeon: [name], MD;  Location: [place];  Service: Open Heart Surgery;  Laterality: N/A;  . THORACIC AORTIC ENDOVASCULAR STENT GRAFT  05/08/2017   Procedure: STENT GRAFTING OF DESCENDING THORACIC AORTA;  Surgeon: [name], MD;  [facility];  Service: Open Heart Surgery;;   Family History  Problem Relation Age of Onset  . Alzheimer's disease Father 23  . Dementia Father   . Stroke Mother 42  . Cancer Brother        Leukemia  . Sudden death Other        family  . Hypertension Other   . Heart disease Neg Hx    Social History   Socioeconomic History  . Marital status: Married    Spouse name: Not on file  . Number of children: Not on file  . Years of education: Not on file  . Highest education level: Not on file  Occupational History  . Occupation: Retired  Tobacco Use  . Smoking status:  Former Smoker    Quit date: 08/09/1974    Years since quitting: 46.3  . Smokeless tobacco: Never Used  Vaping Use  . Vaping Use: Never used  Substance and Sexual Activity  . Alcohol use: No  . Drug use: No  . Sexual activity: Not Currently  Other Topics Concern  . Not on file  Social History Narrative  . Not on file   Social Determinants of Health   Financial Resource Strain: Not on file  Food Insecurity: Not on file  Transportation Needs: Not on file  Physical Activity: Not on file  Stress: Not on file  Social Connections: Not on file    Tobacco Counseling Counseling given: Not Answered   Clinical Intake:                 Diabetic?no         Activities of Daily Living  No flowsheet data found.  Patient Care Team: Eulas Post, MD as PCP - General Josue Hector, MD as PCP - Cardiology (Cardiology)  Indicate any recent Medical Services you may have received from other than [facility] providers in the past year (date may be approximate).     Assessment:   This is a routine wellness examination for Don Medina.  Hearing/Vision screen No exam data present  Dietary issues and exercise activities discussed:    Goals   None    Depression Screen PHQ 2/9 Scores 08/15/2017 08/30/2013  PHQ - 2 Score 0 0    Fall Risk Fall Risk  08/11/2017 02/17/2016 12/09/2015 08/30/2013  Falls in the past year? No No No No    FALL RISK PREVENTION PERTAINING TO THE HOME:  Any stairs in or around the home? Yes  If so, are there any without handrails? Yes  Home free of loose throw rugs in walkways, pet beds, electrical cords, etc? Yes  Adequate lighting in your home to reduce risk of falls? Yes   ASSISTIVE DEVICES UTILIZED TO PREVENT FALLS:  Life alert? No  Use of a cane, walker or w/c? Yes  Grab bars in the bathroom? Yes  Shower chair or bench in shower? Yes  Elevated toilet seat or a handicapped toilet? Yes     Cognitive Function:       Normal cognitive status  assessed by direct observation by this Nurse Health Advisor. No abnormalities found.    Immunizations Immunization History  Administered Date(s) Administered  . Fluad Quad(high Dose 65+) 05/11/2019  . Influenza Split 05/26/2011, 05/18/2012, 06/28/2013  . Influenza, High Dose Seasonal PF 05/06/2014, 05/03/2015, 05/15/2017, 04/29/2020  . Influenza,inj,Quad PF,6+ Mos 06/09/2014  . Influenza-Unspecified 05/14/2016  . PFIZER Comirnaty(Gray Top)Covid-19 Tri-Sucrose Vaccine 10/22/2020  . PFIZER(Purple Top)SARS-COV-2 Vaccination 08/19/2019, 09/13/2019, 03/18/2020  . Pneumococcal Conjugate-13 08/30/2013  . Pneumococcal Polysaccharide-23 08/09/2006, 10/03/2012  . Td 08/09/1998  . Tdap 12/16/2010  . Zoster 05/17/2006    TDAP status: Due, Education has been provided regarding the importance of this vaccine. Advised may receive this vaccine at local pharmacy or Health Dept. Aware to provide a copy of the vaccination record if obtained from local pharmacy or Health Dept. Verbalized acceptance and understanding.  Flu Vaccine status: Up to date  Pneumococcal vaccine status: Up to date  Covid-19 vaccine status: Completed vaccines  Qualifies for Shingles Vaccine? Yes   Zostavax completed Yes   Shingrix Completed?: No.    Education has been provided regarding the importance of this vaccine. Patient has been advised to call insurance company to determine out of pocket expense if they have not yet received this vaccine. Advised may also receive vaccine at local pharmacy or Health Dept. Verbalized acceptance and understanding.  Screening Tests Health Maintenance  Topic Date Due  . Hepatitis C Screening  Never done  . TETANUS/TDAP  12/26/2020  . COVID-19 Vaccine  Completed  . PNA vac Low Risk Adult  Completed  . HPV VACCINES  Aged Out  . INFLUENZA VACCINE  Discontinued    Health Maintenance  Health Maintenance Due  Topic Date Due  . Hepatitis C Screening  Never done    Colorectal cancer  screening: No longer required.   Lung Cn/aancer Screening: (Low Dose CT Chest recommended if Age 41-80 years, 30 pack-year currently smoking OR have quit w/in 15years.) does not qualify.   Lung Cancer Screening Referral: n/a  Additional Screening:  Hepatitis C Screening: does not qualify;  Vision Screening: Recommended annual ophthalmology exams  for early detection of glaucoma and other disorders of the eye. Is the patient up to date with their annual eye exam?  Yes  Who is the provider or what is the name of the office in which the patient attends annual eye exams?  If pt is not established with a provider, would they like to be referred to a provider to establish care? No .   Dental Screening: Recommended annual dental exams for proper oral hygiene  Community Resource Referral / Chronic Care Management: CRR required this visit?  No   CCM required this visit?  No      Plan:     I have personally reviewed and noted the following in the patient's chart:   . Medical and social history . Use of alcohol, tobacco or illicit drugs  . Current medications and supplements . Functional ability and status . Nutritional status . Physical activity . Advanced directives . List of other physicians . Hospitalizations, surgeries, and ER visits in previous 12 months . Vitals . Screenings to include cognitive, depression, and falls . Referrals and appointments  In addition, I have reviewed and discussed with patient certain preventive protocols, quality metrics, and best practice recommendations. A written personalized care plan for preventive services as well as general preventive health recommendations were provided to patient.     Randel Pigg, LPN   [id number]   Nurse Notes: none

## 2020-12-04 ENCOUNTER — Encounter: Payer: Self-pay | Admitting: Family Medicine

## 2020-12-15 DIAGNOSIS — C44519 Basal cell carcinoma of skin of other part of trunk: Secondary | ICD-10-CM | POA: Diagnosis not present

## 2020-12-25 ENCOUNTER — Other Ambulatory Visit: Payer: Self-pay | Admitting: Cardiovascular Disease

## 2021-01-02 ENCOUNTER — Encounter: Payer: Self-pay | Admitting: Family Medicine

## 2021-01-02 ENCOUNTER — Ambulatory Visit (INDEPENDENT_AMBULATORY_CARE_PROVIDER_SITE_OTHER): Payer: Medicare HMO | Admitting: Family Medicine

## 2021-01-02 ENCOUNTER — Other Ambulatory Visit: Payer: Self-pay

## 2021-01-02 VITALS — BP 130/64 | HR 54 | Temp 97.8°F | Wt 183.1 lb

## 2021-01-02 DIAGNOSIS — S40861A Insect bite (nonvenomous) of right upper arm, initial encounter: Secondary | ICD-10-CM

## 2021-01-02 DIAGNOSIS — W57XXXA Bitten or stung by nonvenomous insect and other nonvenomous arthropods, initial encounter: Secondary | ICD-10-CM

## 2021-01-02 NOTE — Patient Instructions (Signed)
Tick Bite Information, Adult Ticks are insects that draw blood for food. Most ticks live in shrubs and grassy and wooded areas. They climb onto people and animals that brush against the leaves and grasses that they rest on. Then they bite, attaching themselves to the skin. Most ticks are harmless, but some ticks may carry germs that can spread to a person through a bite and cause a disease. To reduce your risk of getting a disease from a tick bite, make sure you:  Take steps to prevent tick bites.  Check for ticks after being outdoors where ticks live.  Watch for symptoms of disease if a tick attached to you or if you suspect a tick bite. How can I prevent tick bites? Take these steps to help prevent tick bites when you go outdoors in an area where ticks live: Use insect repellent  Use insect repellent that has DEET (20% or higher), picaridin, or IR3535 in it. Follow the instructions on the label. Use these products on: ? Bare skin. ? The top of your boots. ? Your pant legs. ? Your sleeve cuffs.  For insect repellent that contains permethrin, follow the instructions on the label. Use these products on: ? Clothing. ? Boots. ? Outdoor gear. ? Tents. When you are outside  Wear protective clothing. Long sleeves and long pants offer the best protection from ticks.  Wear light-colored clothing so you can see ticks more easily.  Tuck your pant legs into your socks.  If you go walking on a trail, stay in the middle of the trail so your skin, hair, and clothing do not touch the bushes.  Avoid walking through areas with long grass.  Check for ticks on your clothing, hair, and skin often while you are outside, and check again before you go inside. Make sure to check the scalp, neck, armpits, waist, groin, and joint areas. These are the spots where ticks attach themselves most often. When you go indoors  Check your clothing for ticks. Tumble dry clothes in a dryer on high heat for at least  10 minutes. If clothes are damp, additional time may be needed. If clothes require washing, use hot water.  Examine gear and pets.  Shower soon after being outdoors.  Check your body for ticks. Conduct a full body check using a mirror. What is the proper way to remove a tick? If you find a tick on your body, remove it as soon as possible. Removing a tick sooner can prevent germs from passing to your body. Do not remove the tick with your bare fingers. To remove a tick that is crawling on your skin but has not bitten, use either of these methods:  Go outdoors and brush the tick off.  Remove the tick with tape or a lint roller. To remove a tick that is attached to your skin: 1. Wash your hands. If you have latex gloves, put them on. 2. Use fine-tipped tweezers, curved forceps, or a tick-removal tool to gently grasp the tick as close to your skin and the tick's head as possible. 3. Gently pull with a steady, upward, even pressure until the tick lets go. 4. When removing the tick: ? Take care to keep the tick's head attached to its body. ? Do not twist or jerk the tick. This can make the tick's head or mouth parts break off and remain in the skin. ? Do not squeeze or crush the tick's body. This could force disease-carrying fluids from the tick   into your body. Do not try to remove a tick with heat, alcohol, petroleum jelly, or fingernail polish. Using these methods can cause the tick to salivate and regurgitate into your bloodstream, increasing your risk of getting a disease.   What should I do after removing a tick?  Dispose of the tick. Do not crush a tick with your fingers.  Clean the bite area and your hands with soap and water, rubbing alcohol, or an iodine scrub.  If an antiseptic cream or ointment is available, apply a small amount to the bite site.  Wash and disinfect any instruments that you used to remove the tick. How should I dispose of a tick? To dispose of a live tick, use  one of these methods:  Place it in rubbing alcohol.  Place it in a sealed bag or container.  Wrap it tightly in tape.  Flush it down the toilet. Contact a health care provider if:  You have symptoms of a disease after a tick bite. Symptoms of a tick-borne disease can occur from moments after the tick bites to 30 days after a tick is removed. Symptoms include: ? Fever or chills. ? Any of these signs in the bite area:  A red rash that makes a circle (bull's-eye rash) in the bite area.  Redness and swelling. ? Headache. ? Muscle, joint, or bone pain. ? Abnormal tiredness. ? Numbness in your legs or difficulty walking or moving your legs. ? Tender, swollen lymph glands.  A part of a tick breaks off and gets stuck in your skin. Get help right away if:  You are not able to remove a tick.  You experience muscle weakness or paralysis.  Your symptoms get worse or you experience new symptoms.  You find an engorged tick on your skin and you are in an area where disease from ticks is a high risk. Summary  Ticks may carry germs that can spread to a person through a bite and cause a disease.  Wear protective clothing and use insect repellent to prevent tick bites. Follow the instructions on the label.  If you find a tick on your body, remove it as soon as possible. If the tick is attached, do not try to remove with heat, alcohol, petroleum jelly, or fingernail polish.  Remove the attached tick using fine-tipped tweezers, curved forceps, or a tick-removal tool. Gently pull with steady, upward, even pressure until the tick lets go. Do not twist or jerk the tick. Do not squeeze or crush the tick's body.  If you have symptoms of a disease after being bitten by a tick, contact a health care provider. This information is not intended to replace advice given to you by your health care provider. Make sure you discuss any questions you have with your health care provider. Document Revised:  07/23/2019 Document Reviewed: 07/23/2019 Elsevier Patient Education  2021 [employer]  

## 2021-01-02 NOTE — Progress Notes (Signed)
Established Patient Office Visit  Subjective:  Patient ID: Don Medina, male    DOB: [birth year]  Age: 80 y.o. MRN: [id number]  CC:  Chief Complaint  Patient presents with  . Tick Removal    Found a tick x 2 days, R arm, wants labs done    HPI Don Medina presents for tick bite right arm couple days ago.  Has some mild pruritus today but no other symptoms.  No headache.  No arthralgias.  No rash of erythema migrans.  By description, this sounded like a small deer tick.  Has not applied anything topically.  Past Medical History:  Diagnosis Date  . ADJ DISORDER WITH MIXED ANXIETY & DEPRESSED MOOD 09/08/2009   Qualifier: Diagnosis of  By: Caryl Never MD, Abryanna Musolino    . Arthritis   . Atrial fibrillation (HCC) [I48.91] 05/23/2017  . Blood in the stool   . BRADYCARDIA 03/10/2010   Qualifier: Diagnosis of  By: Caryl Never MD, Khylah Kendra    . CHEST PAIN 03/19/2010   Qualifier: Diagnosis of  By: Elyn Peers, LPN, Christine    . Chicken pox   . COLONIC POLYPS, HX OF 01/13/2009   Qualifier: Diagnosis of  By: Gabriel Rung LPN, Harriett Sine    . Episode of generalized weakness 02/17/2016  . GERD (gastroesophageal reflux disease) 10/03/2012  . Hay fever   . History of colonic polyps   . History of colonoscopy   . Hyperlipidemia   . MUSCLE STRAIN, HAMSTRING MUSCLE 09/08/2009   Qualifier: Diagnosis of  By: Caryl Never MD, Haralambos Yeatts    . ORTHOSTATIC DIZZINESS 03/05/2010   Qualifier: Diagnosis of  By: Caryl Never MD, Miyah Hampshire    . Other premature beats 03/17/2010   Qualifier: Diagnosis of  By: Ladona Ridgel, MD, Jerrell Mylar   . Premature ventricular contraction 01/05/2011  . S/P aortic dissection repair 05/08/2017  . THROMBOCYTOPENIA 03/05/2010   Qualifier: Diagnosis of  By: Caryl Never MD, Yenifer Saccente    . Weakness 12/09/2015    Past Surgical History:  Procedure Laterality Date  . ABDOMINAL ADHESION SURGERY  2007  . AORTIC INTERVENTION N/A 05/08/2017   Procedure: HYPOTHERMIC CIRCULATORY ARREST;  Surgeon: Delight Ovens,  MD;  Location: [facility] OR;  Service: Open Heart Surgery;  Laterality: N/A;  . ASCENDING AORTIC ROOT REPLACEMENT N/A 05/08/2017   Procedure: REPLACEMENT OF ASCENDING AORTIC ARCH;  Surgeon: Delight Ovens, MD;  Location: [facility] OR;  Service: Open Heart Surgery;  Laterality: N/A;  . CANNULATION FOR CARDIOPULMONARY BYPASS Right 05/08/2017   Procedure: AXILLARY CANNULATION;  Surgeon: Delight Ovens, MD;  Location: [facility] OR;  Service: Open Heart Surgery;  Laterality: Right;  . ENDOVASCULAR REPAIR/STENT GRAFT  05/08/2017   Procedure: STENT OF LEFT SUBCLAVIAN ARTERY with 10x39VBX;  Surgeon: Delight Ovens, MD;  Location: [facility] OR;  Service: Open Heart Surgery;;  . herniated diks surgery L-5  1996  . herniated disk surgery c 6-7  1986  . INTRAOPERATIVE TRANSESOPHAGEAL ECHOCARDIOGRAM N/A 05/08/2017   Procedure: INTRAOPERATIVE TRANSESOPHAGEAL ECHOCARDIOGRAM;  Surgeon: Delight Ovens, MD;  Location: [facility] OR;  Service: Open Heart Surgery;  Laterality: N/A;  . REPLACEMENT ASCENDING AORTA N/A 05/08/2017   Procedure: REPLACEMENT ASCENDING AORTA;  Surgeon: Delight Ovens, MD;  Location: [facility] OR;  Service: Open Heart Surgery;  Laterality: N/A;  . THORACIC AORTIC ENDOVASCULAR STENT GRAFT  05/08/2017   Procedure: STENT GRAFTING OF DESCENDING THORACIC AORTA;  Surgeon: Delight Ovens, MD;  Location: [facility] OR;  Service: Open Heart Surgery;;    Family History  Problem Relation  Age of Onset  . Alzheimer's disease Father 62  . Dementia Father   . Stroke Mother 76  . Cancer Brother        Leukemia  . Sudden death Other        family  . Hypertension Other   . Heart disease Neg Hx     Social History   Socioeconomic History  . Marital status: Married    Spouse name: Not on file  . Number of children: Not on file  . Years of education: Not on file  . Highest education level: Not on file  Occupational History  . Occupation: Retired  Tobacco Use  . Smoking status: Former Smoker    Quit date: 08/09/1974    Years  since quitting: 46.4  . Smokeless tobacco: Never Used  Vaping Use  . Vaping Use: Never used  Substance and Sexual Activity  . Alcohol use: No  . Drug use: No  . Sexual activity: Not Currently  Other Topics Concern  . Not on file  Social History Narrative  . Not on file   Social Determinants of Health   Financial Resource Strain: Low Risk   . Difficulty of Paying Living Expenses: Not hard at all  Food Insecurity: No Food Insecurity  . Worried About Programme researcher, broadcasting/film/video in the Last Year: Never true  . Ran Out of Food in the Last Year: Never true  Transportation Needs: No Transportation Needs  . Lack of Transportation (Medical): No  . Lack of Transportation (Non-Medical): No  Physical Activity: Sufficiently Active  . Days of Exercise per Week: 5 days  . Minutes of Exercise per Session: 60 min  Stress: No Stress Concern Present  . Feeling of Stress : Not at all  Social Connections: Moderately Isolated  . Frequency of Communication with Friends and Family: More than three times a week  . Frequency of Social Gatherings with Friends and Family: More than three times a week  . Attends Religious Services: Never  . Active Member of Clubs or Organizations: No  . Attends Banker Meetings: Never  . Marital Status: Married  Catering manager Violence: Not on file    Outpatient Medications Prior to Visit  Medication Sig Dispense Refill  . aspirin 81 MG tablet Take 1 tablet (81 mg total) by mouth daily. 30 tablet   . citalopram (CELEXA) 20 MG tablet TAKE 1 TABLET BY MOUTH EVERY DAY 90 tablet 0  . metoprolol tartrate (LOPRESSOR) 25 MG tablet TAKE 1/2 TABLTE BY MOUTH TWICE A DAY . PT NEEDS FOLLOW UP 30 tablet 0  . Misc Natural Products (GLUCOS-CHONDROIT-MSM COMPLEX) TABS Take 1 tablet by mouth 3 (three) times daily.    . Omega-3 Fatty Acids (FISH OIL) 1000 MG CAPS Take 1 capsule by mouth daily.    . simvastatin (ZOCOR) 20 MG tablet TAKE 1 TABLET BY MOUTH AT BEDTIME**NEED OFFICE  VISIT** 90 tablet 2  . warfarin (COUMADIN) 2.5 MG tablet TAKE 2 TABLETS BY MOUTH DAILY EXCEPT TAKE 3 TABS ON TUESDAYS AND SAT OR TAKE AS DIRECTED PER COUMADIN CLINIC 220 tablet 1   No facility-administered medications prior to visit.    Allergies  Allergen Reactions  . 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 Quinolones     Patient was warned about not using Cipro and similar antibiotics. Recent studies have raised concern that fluoroquinolone antibiotics could be associated with an increased risk of aortic aneurysm Fluoroquinolones have non-antimicrobial properties  that might jeopardise the integrity of the extracellular matrix of the vascular wall In a  propensity score matched cohort study in [place], there was a 66% increased rate of aortic aneurysm or dissection associated with oral fluoroquinolone use, compared wit    ROS Review of Systems  Constitutional: Negative for chills and fever.  Musculoskeletal: Negative for arthralgias.  Neurological: Negative for headaches.      Objective:    Physical Exam Vitals reviewed.  Constitutional:      Appearance: Normal appearance.  Cardiovascular:     Rate and Rhythm: Normal rate and regular rhythm.  Skin:    Comments: Approximately 1 x 1 cm area of diffuse nonspecific erythema and a couple excoriations right upper arm.  No necrosis.  No rash to suggest erythema migrans.  No visible retained tick parts.  Neurological:     Mental Status: He is alert.     BP 130/64 (BP Location: Left Arm, Patient Position: Sitting, Cuff Size: Normal)   Pulse (!) 54   Temp 97.8 F (36.6 C) (Oral)   Wt 183 lb 1.6 oz (83.1 kg)   SpO2 95%   BMI 27.84 kg/m  Wt Readings from Last 3 Encounters:  01/02/21 183 lb 1.6 oz (83.1 kg)  05/08/20 180 lb 6.4 oz (81.8 kg)  05/05/20 181 lb 3.2 oz (82.2 kg)     Health Maintenance Due  Topic Date Due  . Hepatitis C Screening  Never done  . Zoster Vaccines- Shingrix (1 of 2)  01/03/1992  . TETANUS/TDAP  12/26/2020    There are no preventive care reminders to display for this patient.  Lab Results  Component Value Date   TSH 5.980 (H) 06/01/2017   Lab Results  Component Value Date   WBC 6.4 05/22/2020   HGB 15.2 05/22/2020   HCT 44.7 05/22/2020   MCV 94.1 05/22/2020   PLT 110 (L) 05/22/2020   Lab Results  Component Value Date   NA 141 05/22/2020   K 4.3 05/22/2020   CO2 27 05/22/2020   GLUCOSE 98 05/22/2020   BUN 19 05/22/2020   CREATININE 1.10 05/22/2020   BILITOT 0.7 05/22/2020   ALKPHOS 44 05/02/2018   AST 23 05/22/2020   ALT 15 05/22/2020   PROT 6.7 05/22/2020   ALBUMIN 4.0 05/02/2018   CALCIUM 9.0 05/22/2020   ANIONGAP 11 09/17/2017   GFR 62.51 05/02/2018   Lab Results  Component Value Date   CHOL 145 05/22/2020   Lab Results  Component Value Date   HDL 49 05/22/2020   Lab Results  Component Value Date   LDLCALC 76 05/22/2020   Lab Results  Component Value Date   TRIG 117 05/22/2020   Lab Results  Component Value Date   CHOLHDL 3.0 05/22/2020   Lab Results  Component Value Date   HGBA1C 6.1 (H) 12/09/2015      Assessment & Plan:   Problem List Items Addressed This Visit   None   Visit Diagnoses    Tick bite of right upper arm, initial encounter    -  Primary    This sounds like small deer tick.  Mild local allergic reaction.  No worrisome symptoms.  Reassurance given.  Apply over-the-counter topical hydrocortisone.  We reviewed signs and symptoms of Lyme disease to be on look out for.  Follow-up for any headache, fever, rash of erythema migrans and we showed him pictures of what to look for  No orders of the defined types were placed in this encounter.  Follow-up: No follow-ups on file.    Evelena Peat, MD

## 2021-01-06 ENCOUNTER — Telehealth: Payer: Self-pay | Admitting: Family Medicine

## 2021-01-06 NOTE — Telephone Encounter (Signed)
Pt call and stated he need a refill on pantoprazole (PROTONIX) 40 MG tablet  Sent to  [id number] - [place], [place] - [address] Phone:  [phone]  Fax:  [phone]

## 2021-01-06 NOTE — Telephone Encounter (Signed)
Please advise. This medication is not on the patients med list.

## 2021-01-07 NOTE — Telephone Encounter (Signed)
Spoke with the patient spouse. She will relay the message and have him call back to let us know if he is still taking this medication.

## 2021-01-12 ENCOUNTER — Ambulatory Visit (INDEPENDENT_AMBULATORY_CARE_PROVIDER_SITE_OTHER): Payer: Medicare HMO | Admitting: General Practice

## 2021-01-12 ENCOUNTER — Other Ambulatory Visit: Payer: Self-pay

## 2021-01-12 DIAGNOSIS — I4891 Unspecified atrial fibrillation: Secondary | ICD-10-CM

## 2021-01-12 DIAGNOSIS — Z9889 Other specified postprocedural states: Secondary | ICD-10-CM

## 2021-01-12 DIAGNOSIS — Z7901 Long term (current) use of anticoagulants: Secondary | ICD-10-CM

## 2021-01-12 LAB — POCT INR: INR: 3.8 — AB (ref 2.0–3.0)

## 2021-01-12 MED ORDER — PANTOPRAZOLE SODIUM 40 MG PO TBEC: 40.0000 mg | DELAYED_RELEASE_TABLET | Freq: Every day | ORAL | 0 refills | Status: DC

## 2021-01-12 NOTE — Patient Instructions (Addendum)
Pre visit review using our clinic review tool, if applicable. No additional management support is needed unless otherwise documented below in the visit note.  Skip dosage today and then continue taking 5mg  daily except 7.5mg  on Tuesdays and Saturdays. Recheck in 3 weeks.

## 2021-01-12 NOTE — Telephone Encounter (Signed)
Patient came in to the office today and stated that he is still using this medication but only as needed.   [place] for refill?

## 2021-01-12 NOTE — Addendum Note (Signed)
Addended by: Rebecca Eaton on: 01/12/2021 01:16 PM   Modules accepted: Orders

## 2021-01-12 NOTE — Telephone Encounter (Signed)
Spoke with the patient earlier today and he did state he was taking this PRN.  Rx sent in.  Left message for patient to call back.

## 2021-01-13 NOTE — Telephone Encounter (Signed)
Left message for patient to call back  

## 2021-01-14 NOTE — Telephone Encounter (Signed)
Spoke with the patients pharmacy. He has already picked this up. Nothing further needed.

## 2021-01-28 ENCOUNTER — Other Ambulatory Visit: Payer: Self-pay | Admitting: Cardiovascular Disease

## 2021-01-30 ENCOUNTER — Other Ambulatory Visit: Payer: Self-pay | Admitting: Family Medicine

## 2021-01-30 DIAGNOSIS — I480 Paroxysmal atrial fibrillation: Secondary | ICD-10-CM

## 2021-01-30 DIAGNOSIS — Z7901 Long term (current) use of anticoagulants: Secondary | ICD-10-CM

## 2021-02-02 ENCOUNTER — Ambulatory Visit (INDEPENDENT_AMBULATORY_CARE_PROVIDER_SITE_OTHER): Payer: Medicare HMO | Admitting: General Practice

## 2021-02-02 ENCOUNTER — Other Ambulatory Visit: Payer: Self-pay

## 2021-02-02 DIAGNOSIS — I4891 Unspecified atrial fibrillation: Secondary | ICD-10-CM | POA: Diagnosis not present

## 2021-02-02 DIAGNOSIS — Z9889 Other specified postprocedural states: Secondary | ICD-10-CM

## 2021-02-02 DIAGNOSIS — Z7901 Long term (current) use of anticoagulants: Secondary | ICD-10-CM

## 2021-02-02 LAB — POCT INR: INR: 2.6 (ref 2.0–3.0)

## 2021-02-02 NOTE — Patient Instructions (Addendum)
Pre visit review using our clinic review tool, if applicable. No additional management support is needed unless otherwise documented below in the visit note.  Continue taking 5mg daily except 7.5mg on Tuesdays and Saturdays. Recheck in 6 weeks. 

## 2021-02-06 ENCOUNTER — Other Ambulatory Visit: Payer: Self-pay | Admitting: Family Medicine

## 2021-02-19 ENCOUNTER — Other Ambulatory Visit: Payer: Self-pay | Admitting: Family Medicine

## 2021-02-24 ENCOUNTER — Other Ambulatory Visit: Payer: Self-pay

## 2021-02-24 DIAGNOSIS — I724 Aneurysm of artery of lower extremity: Secondary | ICD-10-CM

## 2021-02-28 ENCOUNTER — Other Ambulatory Visit: Payer: Self-pay | Admitting: Family Medicine

## 2021-03-04 ENCOUNTER — Encounter (HOSPITAL_COMMUNITY): Payer: Medicare HMO

## 2021-03-04 ENCOUNTER — Ambulatory Visit: Payer: Medicare HMO | Admitting: Vascular Surgery

## 2021-03-04 ENCOUNTER — Other Ambulatory Visit: Payer: Self-pay | Admitting: Family Medicine

## 2021-03-10 ENCOUNTER — Other Ambulatory Visit: Payer: Self-pay | Admitting: Surgery

## 2021-03-10 DIAGNOSIS — Z9889 Other specified postprocedural states: Secondary | ICD-10-CM

## 2021-03-13 NOTE — Progress Notes (Signed)
CARDIOLOGY OFFICE NOTE  Date:  03/18/2021    Don Medina Date of Birth: [birth year] Medical Record [id number]  PCP:  Don Post, MD  Cardiologist:  Don Medina   No chief complaint on file.   History of Present Illness: Don Medina is a 80 y.o. male seen f/u  for acute type A aortic dissection.    He has a history of HLD and PVCs. No history of CAD, heart attack, HTN, HLD or DM. No prior tobacco use. Had new onset chest and back pain September 2018 with dissection     He was taken to the OR by Dr. Servando Snare 05/08/17 he underwent grafting of the ascending aorta above the sinus with resuspension of AV  Right innominate tied off. Balloon stent graft extending past left subclavian with graft to graft in ascending aorta bifucated to innominate and carotid and graft to graft left subclavian. 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    Echo  11/29/19 Normal EF trivial AR  Cardiac CTA 06/09/17 with no significant CAD stable repair   Had right popliteal aneurysm Last duplex  01/09/20   2.09 cm   Followed by Dr Doren Custard   Daughter in [place] has liver failure from ETOH   Has been maintained on coumadin for PAF and left axillary vein thrombosis   Dr Elease Hashimoto follows INR been Rx with no bleeding issues  Past Medical History:  Diagnosis Date   ADJ DISORDER WITH MIXED ANXIETY & DEPRESSED MOOD 09/08/2009   Qualifier: Diagnosis of  By: Elease Hashimoto MD, Bruce     Arthritis    Atrial fibrillation ([place]) [I48.91] 05/23/2017   Blood in the stool    BRADYCARDIA 03/10/2010   Qualifier: Diagnosis of  By: Elease Hashimoto MD, Bruce     CHEST PAIN 03/19/2010   Qualifier: Diagnosis of  By: [place], LPN, Christine     Chicken pox    COLONIC POLYPS, HX OF 01/13/2009   Qualifier: Diagnosis of  By: Valma Cava LPN, Nancy     Episode of generalized weakness 02/17/2016   GERD  (gastroesophageal reflux disease) 10/03/2012   Hay fever    History of colonic polyps    History of colonoscopy    Hyperlipidemia    MUSCLE STRAIN, HAMSTRING MUSCLE 09/08/2009   Qualifier: Diagnosis of  By: Elease Hashimoto MD, Earl Many DIZZINESS 03/05/2010   Qualifier: Diagnosis of  By: Elease Hashimoto MD, Bruce     Other premature beats 03/17/2010   Qualifier: Diagnosis of  By: Lovena Le, MD, Martyn Malay    Premature ventricular contraction 01/05/2011   S/P aortic dissection repair 05/08/2017   THROMBOCYTOPENIA 03/05/2010   Qualifier: Diagnosis of  By: Elease Hashimoto MD, Bruce     Weakness 12/09/2015    Past Surgical History:  Procedure Laterality Date   ABDOMINAL ADHESION SURGERY  2007   AORTIC INTERVENTION N/A 05/08/2017   Procedure: HYPOTHERMIC CIRCULATORY ARREST;  Surgeon: [name], MD;  Location: [place];  Service: Open Heart Surgery;  Laterality: N/A;   ASCENDING AORTIC ROOT REPLACEMENT N/A 05/08/2017   Procedure: REPLACEMENT OF ASCENDING AORTIC ARCH;  Surgeon: [name], MD;  Location: [place];  Service: Open Heart Surgery;  Laterality: N/A;   CANNULATION FOR CARDIOPULMONARY BYPASS Right 05/08/2017   Procedure: AXILLARY CANNULATION;  Surgeon: [name], MD;  Location: [place];  Service: Open Heart Surgery;  Laterality: Right;   ENDOVASCULAR REPAIR/STENT GRAFT  05/08/2017   Procedure: STENT OF LEFT SUBCLAVIAN ARTERY with 10x39VBX;  Surgeon: [name], MD;  Location: [place];  Service: Open Heart Surgery;;   herniated diks surgery L-5  1996   herniated disk surgery c 6-7  1986   INTRAOPERATIVE TRANSESOPHAGEAL ECHOCARDIOGRAM N/A 05/08/2017   Procedure: INTRAOPERATIVE TRANSESOPHAGEAL ECHOCARDIOGRAM;  Surgeon: [name], MD;  Location: [place];  Service: Open Heart Surgery;  Laterality: N/A;   REPLACEMENT ASCENDING AORTA N/A 05/08/2017   Procedure: REPLACEMENT ASCENDING AORTA;  Surgeon: [name], MD;  Location: [place];  Service: Open Heart Surgery;   Laterality: N/A;   THORACIC AORTIC ENDOVASCULAR STENT GRAFT  05/08/2017   Procedure: STENT GRAFTING OF DESCENDING THORACIC AORTA;  Surgeon: [name], MD;  Location: [facility] OR;  Service: Open Heart Surgery;;     Medications: Current Meds  Medication Sig   aspirin 81 MG tablet Take 1 tablet (81 mg total) by mouth daily.   citalopram (CELEXA) 20 MG tablet TAKE 1 TABLET BY MOUTH EVERY DAY   folic acid (FOLVITE) 1 MG tablet Take 1 tablet by mouth daily.   metoprolol tartrate (LOPRESSOR) 25 MG tablet TAKE 1/2 TABLTE BY MOUTH TWICE A DAY . PT NEEDS FOLLOW UP   Misc Natural Products (GLUCOS-CHONDROIT-MSM COMPLEX) TABS Take 1 tablet by mouth 3 (three) times daily.   Omega-3 Fatty Acids (FISH OIL) 1000 MG CAPS Take 1 capsule by mouth daily.   pantoprazole (PROTONIX) 40 MG tablet TAKE 1 TABLET (40 MG TOTAL) BY MOUTH DAILY. AS NEEDED FOR HEARTBURN   propranolol (INDERAL) 10 MG tablet Take 1 tablet by mouth daily.   simvastatin (ZOCOR) 20 MG tablet TAKE 1 TABLET BY MOUTH AT BEDTIME**NEED OFFICE VISIT**   simvastatin (ZOCOR) 80 MG tablet Take 0.5 tablets by mouth at bedtime.   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 Recent studies have raised concern that fluoroquinolone antibiotics could be associated with an increased risk of aortic aneurysm Fluoroquinolones have non-antimicrobial properties that might jeopardise the integrity of the extracellular matrix of the vascular wall In a  propensity score matched cohort study in [place], there was a 66% increased rate of aortic aneurysm or dissection associated with oral fluoroquinolone use, compared wit    Social History: The patient  reports that he  quit smoking about 46 years ago. His smoking use included cigarettes. He has never used smokeless tobacco. He reports that he does not drink alcohol and does not use drugs.   Family History: The patient's family history includes Alzheimer's disease (age of onset: 49) in his father; Cancer in his brother; Dementia in his father; Hypertension in an other family member; Stroke (age of onset: 71) in his mother; Sudden death in an other family member.   Review of Systems: Please see the history of present illness.   Otherwise, the review of systems is positive for none.   All other systems are reviewed and negative.   Physical Exam: VS:  BP 130/70   Pulse 60   Ht '5\' 8"'$  (1.727 m)   Wt 82.5 kg   SpO2 98%   BMI  27.64 kg/m  .  BMI Body mass index is 27.64 kg/m.  Wt Readings from Last 3 Encounters:  03/18/21 82.5 kg  01/02/21 83.1 kg  05/08/20 81.8 kg    Affect appropriate Healthy:  appears stated age 11: normal Neck supple with no adenopathy JVP normal no bruits no thyromegaly Lungs clear with no wheezing and good diaphragmatic motion Heart:  S1/S2 AR murmur, no rub, gallop or click PMI normal Medina sternotomy  Abdomen: benighn, BS positve, no tenderness, no AAA no bruit.  No HSM or HJR Distal pulses intact with no bruits No edema Neuro non-focal Skin warm and dry No muscular weakness    LABORATORY DATA:  EKG:  11/29/19 SR rate 54 nonspecific ST changes 03/18/2021 SR rate 60 IVCD nonspecific ST changes   Lab Results  Component Value Date   WBC 6.4 05/22/2020   HGB 15.2 05/22/2020   HCT 44.7 05/22/2020   PLT 110 (L) 05/22/2020   GLUCOSE 98 05/22/2020   CHOL 145 05/22/2020   TRIG 117 05/22/2020   HDL 49 05/22/2020   LDLCALC 76 05/22/2020   ALT 15 05/22/2020   AST 23 05/22/2020   NA 141 05/22/2020   K 4.3 05/22/2020   CL 105 05/22/2020   CREATININE 1.10 05/22/2020   BUN 19 05/22/2020   CO2 27 05/22/2020   TSH 5.980 (H) 06/01/2017   PSA 1.55 05/02/2018   INR 3.0  03/16/2021   HGBA1C 6.1 (H) 12/09/2015     BNP (last 3 results) No results for input(s): BNP in the last 8760 hours.  ProBNP (last 3 results) No results for input(s): PROBNP in the last 8760 hours.   Other Studies Reviewed Today:  TTE:  11/29/19    IMPRESSIONS     1. Normal GLS - 20.1 . Left ventricular ejection fraction, by estimation,  is 60 to 65%. The left ventricle has normal function. The left ventricle  has no regional wall motion abnormalities. Left ventricular diastolic  parameters were normal.   2. Right ventricular systolic function is normal. The right ventricular  size is normal.   3. Left atrial size was moderately dilated.   4. The mitral valve is normal in structure. No evidence of mitral valve  regurgitation. No evidence of mitral stenosis.   5. The aortic valve is tricuspid. Aortic valve regurgitation is trivial.  No aortic stenosis is present.   6. Medina type A dissection with repair no residual intimal defect noted  and normal aortic root size.   7. The inferior vena cava is normal in size with greater than 50%  respiratory variability, suggesting right atrial pressure of 3 mmHg.   Comparison(s): 04/11/18 EF 55-60%.    Assessment/Plan:   1. Type A Aortic Dissection F/U Gerhardt repair intact on cardiac CT 19/30/32 BP under good control   Review of TTE 11/29/19 shows trivial  AR normal EF and normal aortic root size with mild dilatation at the sinus level   2. Orthostatic hypotension/dizziness - improved with lower BP meds    3. Medina op AF - now off amiodarone maintaining NSR   4. +HIT noted PLT still down 110 05/22/20    5. Popliteal Aneurysm:  Duplex 01/09/20 right mid popliteal 2.09 stable   Given PAF, popliteal aneurysm ( currently no mural thrombus to potentially embolize distally given anticoagulation ) And thrombosis of the left subclavian vein would suggest that life long coumadin appropriate       Jenkins Rouge

## 2021-03-16 ENCOUNTER — Other Ambulatory Visit: Payer: Self-pay

## 2021-03-16 ENCOUNTER — Ambulatory Visit (INDEPENDENT_AMBULATORY_CARE_PROVIDER_SITE_OTHER): Payer: Medicare HMO | Admitting: General Practice

## 2021-03-16 DIAGNOSIS — Z7901 Long term (current) use of anticoagulants: Secondary | ICD-10-CM

## 2021-03-16 DIAGNOSIS — Z9889 Other specified postprocedural states: Secondary | ICD-10-CM | POA: Diagnosis not present

## 2021-03-16 DIAGNOSIS — I4891 Unspecified atrial fibrillation: Secondary | ICD-10-CM

## 2021-03-16 LAB — POCT INR: INR: 3 (ref 2.0–3.0)

## 2021-03-16 NOTE — Patient Instructions (Addendum)
Pre visit review using our clinic review tool, if applicable. No additional management support is needed unless otherwise documented below in the visit note.  Continue taking 5mg daily except 7.5mg on Tuesdays and Saturdays. Recheck in 6 weeks. 

## 2021-03-18 ENCOUNTER — Ambulatory Visit: Payer: Medicare HMO | Admitting: Cardiovascular Disease

## 2021-03-18 ENCOUNTER — Other Ambulatory Visit: Payer: Self-pay

## 2021-03-18 ENCOUNTER — Encounter: Payer: Self-pay | Admitting: Cardiovascular Disease

## 2021-03-18 VITALS — BP 130/70 | HR 60 | Ht 68.0 in | Wt 181.8 lb

## 2021-03-18 DIAGNOSIS — I951 Orthostatic hypotension: Secondary | ICD-10-CM | POA: Diagnosis not present

## 2021-03-18 DIAGNOSIS — I724 Aneurysm of artery of lower extremity: Secondary | ICD-10-CM | POA: Diagnosis not present

## 2021-03-18 DIAGNOSIS — Z8679 Personal history of other diseases of the circulatory system: Secondary | ICD-10-CM | POA: Diagnosis not present

## 2021-03-18 NOTE — Patient Instructions (Addendum)
Medication Instructions:  Your physician recommends that you continue on your current medications as directed. Please refer to the Current Medication list given to you today.  *If you need a refill on your cardiac medications before your next appointment, please call your pharmacy*  Lab Work: If you have labs (blood work) drawn today and your tests are completely normal, you will receive your results only by: [facility] (if you have MyChart) OR A paper copy in the mail If you have any lab test that is abnormal or we need to change your treatment, we will call you to review the results.  Testing/Procedures: None ordered today.  Follow-Up: At [facility], you and your health needs are our priority.  As part of our continuing mission to provide you with exceptional heart care, we have created designated Provider Care Teams.  These Care Teams include your primary Cardiologist (physician) and Advanced Practice Providers (APPs -  Physician Assistants and Nurse Practitioners) who all work together to provide you with the care you need, when you need it.  We recommend signing up for the patient portal called "MyChart".  Sign up information is provided on this After Visit Summary.  MyChart is used to connect with patients for Virtual Visits (Telemedicine).  Patients are able to view lab/test results, encounter notes, upcoming appointments, etc.  Non-urgent messages can be sent to your provider as well.   To learn more about what you can do with MyChart, go to [email or web address].    Your next appointment:   12 month(s)  The format for your next appointment:   In Person  Provider:   You may see Jenkins Rouge, MD or one of the following Advanced Practice Providers on your designated Care Team:   Cecilie Kicks, NP

## 2021-03-25 ENCOUNTER — Ambulatory Visit (INDEPENDENT_AMBULATORY_CARE_PROVIDER_SITE_OTHER): Payer: Medicare HMO | Admitting: Vascular Surgery

## 2021-03-25 ENCOUNTER — Ambulatory Visit (HOSPITAL_COMMUNITY)
Admission: RE | Admit: 2021-03-25 | Discharge: 2021-03-25 | Disposition: A | Discharge status: Home / Self Care | Payer: Medicare HMO | Source: Ambulatory Visit | Attending: Vascular Surgery | Admitting: Vascular Surgery

## 2021-03-25 ENCOUNTER — Encounter: Payer: Self-pay | Admitting: Vascular Surgery

## 2021-03-25 ENCOUNTER — Other Ambulatory Visit: Payer: Self-pay

## 2021-03-25 ENCOUNTER — Ambulatory Visit (INDEPENDENT_AMBULATORY_CARE_PROVIDER_SITE_OTHER)
Admission: RE | Admit: 2021-03-25 | Discharge: 2021-03-25 | Disposition: A | Discharge status: Home / Self Care | Payer: Medicare HMO | Source: Ambulatory Visit | Attending: Vascular Surgery | Admitting: Vascular Surgery

## 2021-03-25 VITALS — BP 130/74 | HR 54 | Temp 98.0°F | Resp 20 | Ht 68.0 in | Wt 184.0 lb

## 2021-03-25 DIAGNOSIS — I724 Aneurysm of artery of lower extremity: Secondary | ICD-10-CM

## 2021-03-25 NOTE — Progress Notes (Signed)
REASON FOR VISIT:   Follow-up of right popliteal artery aneurysm  MEDICAL ISSUES:   RIGHT POPLITEAL ARTERY ANEURYSM: His right popliteal artery aneurysm is stable in size.  He has some ectasia of the left popliteal artery which is also stable in size.  Given that this is fairly small and has been stable over the last few years I think I will stretch his follow-up out to 18 months.  I will order a follow-up duplex and ABIs in 18 months and I will see him back at that time.  He knows to call sooner if he has problems.  I encouraged him to continue to remain active.  Fortunately he is not a smoker.   HPI:   Don Medina is a pleasant 80 y.o. male who I last saw on 01/09/2020.  I been following him with a right popliteal artery aneurysm.  At that time he was asymptomatic with palpable pedal pulses.  His duplex scan at the time of the last visit showed that the maximum diameter of the right popliteal artery was 2.1 cm.  It had been 2.2 cm 6 months prior to that.  I did not see a significant mount of laminated thrombus in the popliteal artery.  On the left the maximum diameter was 1.4 cm.  Of note, this patient is status post repair of an aortic dissection.  Since I saw him last he remains very active and still works out at [employer].  He is not a smoker.  He is on Coumadin for atrial fibrillation.  For this reason he is not on aspirin.  He is on a statin.  He denies any history of claudication, rest pain, or nonhealing ulcers.  Past Medical History:  Diagnosis Date   ADJ DISORDER WITH MIXED ANXIETY & DEPRESSED MOOD 09/08/2009   Qualifier: Diagnosis of  By: Elease Hashimoto MD, Bruce     Arthritis    Atrial fibrillation ([place]) [I48.91] 05/23/2017   Blood in the stool    BRADYCARDIA 03/10/2010   Qualifier: Diagnosis of  By: Elease Hashimoto MD, Bruce     CHEST PAIN 03/19/2010   Qualifier: Diagnosis of  By: York, LPN, Christine     Chicken pox    COLONIC POLYPS, HX OF 01/13/2009   Qualifier: Diagnosis  of  By: Valma Cava LPN, Nancy     Episode of generalized weakness 02/17/2016   GERD (gastroesophageal reflux disease) 10/03/2012   Hay fever    History of colonic polyps    History of colonoscopy    Hyperlipidemia    MUSCLE STRAIN, HAMSTRING MUSCLE 09/08/2009   Qualifier: Diagnosis of  By: Elease Hashimoto MD, Earl Many DIZZINESS 03/05/2010   Qualifier: Diagnosis of  By: Elease Hashimoto MD, Bruce     Other premature beats 03/17/2010   Qualifier: Diagnosis of  By: Lovena Le, MD, Martyn Malay    Premature ventricular contraction 01/05/2011   S/P aortic dissection repair 05/08/2017   THROMBOCYTOPENIA 03/05/2010   Qualifier: Diagnosis of  By: Elease Hashimoto MD, Bruce     Weakness 12/09/2015    Family History  Problem Relation Age of Onset   Alzheimer's disease Father 54   Dementia Father    Stroke Mother 52   Cancer Brother        Leukemia   Sudden death Other        family   Hypertension Other    Heart disease Neg Hx     SOCIAL HISTORY: Social History   Tobacco Use  Smoking status: Former    Types: Cigarettes    Quit date: 08/09/1974    Years since quitting: 46.6   Smokeless tobacco: Never  Substance Use Topics   Alcohol use: No    Allergies  Allergen Reactions   Heparin     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 Recent studies have raised concern that fluoroquinolone antibiotics could be associated with an increased risk of aortic aneurysm Fluoroquinolones have non-antimicrobial properties that might jeopardise the integrity of the extracellular matrix of the vascular wall In a  propensity score matched cohort study in [place], there was a 66% increased rate of aortic aneurysm or dissection associated with oral fluoroquinolone use, compared wit    Current Outpatient Medications  Medication Sig Dispense Refill   citalopram (CELEXA) 20 MG tablet TAKE 1 TABLET BY MOUTH EVERY DAY 90  tablet 0   folic acid (FOLVITE) 1 MG tablet Take 1 tablet by mouth daily.     metoprolol tartrate (LOPRESSOR) 25 MG tablet TAKE 1/2 TABLTE BY MOUTH TWICE A DAY . PT NEEDS FOLLOW UP 90 tablet 0   Misc Natural Products (GLUCOS-CHONDROIT-MSM COMPLEX) TABS Take 1 tablet by mouth 3 (three) times daily.     Omega-3 Fatty Acids (FISH OIL) 1000 MG CAPS Take 1 capsule by mouth daily.     pantoprazole (PROTONIX) 40 MG tablet TAKE 1 TABLET (40 MG TOTAL) BY MOUTH DAILY. AS NEEDED FOR HEARTBURN 30 tablet 0   simvastatin (ZOCOR) 20 MG tablet TAKE 1 TABLET BY MOUTH AT BEDTIME**NEED OFFICE VISIT** 90 tablet 2   warfarin (COUMADIN) 2.5 MG tablet TAKE 2 TABLETS BY MOUTH DAILY EXCEPT TAKE 3 TABS ON TUESDAYS AND SAT OR TAKE AS DIRECTED PER COUMADIN CLINIC 220 tablet 1   No current facility-administered medications for this visit.    REVIEW OF SYSTEMS:  '[X]'$  denotes positive finding, '[ ]'$  denotes negative finding Cardiac  Comments:  Chest pain or chest pressure:    Shortness of breath upon exertion:    Short of breath when lying flat:    Irregular heart rhythm:        Vascular    Pain in calf, thigh, or hip brought on by ambulation:    Pain in feet at night that wakes you up from your sleep:     Blood clot in your veins:    Leg swelling:         Pulmonary    Oxygen at home:    Productive cough:     Wheezing:         Neurologic    Sudden weakness in arms or legs:     Sudden numbness in arms or legs:     Sudden onset of difficulty speaking or slurred speech:    Temporary loss of vision in one eye:     Problems with dizziness:         Gastrointestinal    Blood in stool:     Vomited blood:         Genitourinary    Burning when urinating:     Blood in urine:        Psychiatric    Major depression:         Hematologic    Bleeding problems:    Problems with blood clotting too easily:        Skin    Rashes or ulcers:  Constitutional    Fever or chills:     PHYSICAL EXAM:   Vitals:    03/25/21 1230  BP: 130/74  Pulse: (!) 54  Resp: 20  Temp: 98 F (36.7 C)  SpO2: 96%  Weight: 184 lb (83.5 kg)  Height: '5\' 8"'$  (1.727 m)    GENERAL: The patient is a well-nourished male, in no acute distress. The vital signs are documented above. CARDIAC: There is a regular rate and rhythm.  VASCULAR: I do not detect carotid bruits. On the right side he has a palpable femoral, popliteal, dorsalis pedis, and posterior tibial pulse. On the left side, he has a palpable femoral, popliteal, posterior tibial pulse. He has no significant lower extremity swelling. PULMONARY: There is good air exchange bilaterally without wheezing or rales. ABDOMEN: Soft and non-tender with normal pitched bowel sounds.  I do not palpate an abdominal aortic aneurysm. MUSCULOSKELETAL: There are no major deformities or cyanosis. NEUROLOGIC: No focal weakness or paresthesias are detected. SKIN: There are no ulcers or rashes noted. PSYCHIATRIC: The patient has a normal affect.  DATA:    ARTERIAL DOPPLER STUDY: I have independently interpreted his arterial Doppler study today.  On the right side there is a triphasic dorsalis pedis and posterior tibial signal.  ABI is 100%.  Toe pressures 112 mmHg.  On the left side there is a triphasic dorsalis pedis and posterior tibial signal.  ABI is 100%.  Toe pressures 122 mmHg.  ARTERIAL DUPLEX: I have independently interpreted his arterial duplex scan today.  The right popliteal artery aneurysm measures 1.9 cm in maximum diameter.  Thus this has not changed in size over the last year.  The left popliteal artery measures 1.3 cm in maximum diameter.  Thus this is not changed in diameter over the last year.  Deitra Mayo [facility] [phone]

## 2021-03-27 ENCOUNTER — Other Ambulatory Visit: Payer: Self-pay | Admitting: Family Medicine

## 2021-04-22 ENCOUNTER — Other Ambulatory Visit: Payer: Self-pay | Admitting: Family Medicine

## 2021-04-26 ENCOUNTER — Other Ambulatory Visit: Payer: Self-pay | Admitting: Cardiovascular Disease

## 2021-04-27 ENCOUNTER — Ambulatory Visit (INDEPENDENT_AMBULATORY_CARE_PROVIDER_SITE_OTHER): Payer: Medicare HMO

## 2021-04-27 ENCOUNTER — Other Ambulatory Visit: Payer: Self-pay

## 2021-04-27 DIAGNOSIS — Z7901 Long term (current) use of anticoagulants: Secondary | ICD-10-CM

## 2021-04-27 LAB — POCT INR: INR: 2.6 (ref 2.0–3.0)

## 2021-04-27 NOTE — Patient Instructions (Addendum)
Pre visit review using our clinic review tool, if applicable. No additional management support is needed unless otherwise documented below in the visit note.  Continue taking 5mg daily except 7.5mg on Tuesdays and Saturdays. Recheck in 6 weeks. 

## 2021-05-13 ENCOUNTER — Ambulatory Visit
Admission: RE | Admit: 2021-05-13 | Discharge: 2021-05-13 | Disposition: A | Discharge status: Home / Self Care | Payer: Medicare HMO | Source: Ambulatory Visit | Attending: Surgery | Admitting: Surgery

## 2021-05-13 ENCOUNTER — Ambulatory Visit: Payer: Medicare HMO | Admitting: Surgery

## 2021-05-13 ENCOUNTER — Encounter: Payer: Self-pay | Admitting: Surgery

## 2021-05-13 ENCOUNTER — Other Ambulatory Visit: Payer: Self-pay

## 2021-05-13 VITALS — BP 140/66 | HR 55 | Resp 20 | Ht 68.0 in | Wt 184.0 lb

## 2021-05-13 DIAGNOSIS — Z9889 Other specified postprocedural states: Secondary | ICD-10-CM

## 2021-05-13 DIAGNOSIS — I7101 Dissection of ascending aorta: Secondary | ICD-10-CM

## 2021-05-13 MED ORDER — IOPAMIDOL (ISOVUE-370) INJECTION 76%
75.0000 mL | Freq: Once | INTRAVENOUS | Status: AC | PRN
  Administered 2021-05-13: 75 mL via INTRAVENOUS

## 2021-05-13 NOTE — Progress Notes (Signed)
HPI:  The patient is a 80 year old gentleman who underwent repair of an acute type 1 aortic dissection by Dr. Servando Snare on 05/08/2017.  He had resuspension repair of the aortic valve with replacement of the ascending aorta, arch deep branching, and frozen elephant trunk with placement of 31 x 31 x 10 cm CTAG Gore stent graft, placement of a 10 x 62 VBX balloon expandable stent into the left subclavian artery, a 12 x 8-cm graft off the ascending aorta to the innominate and left carotid arteries.  He last saw Dr. Servando Snare in follow-up on 05/08/2020.  He continues to feel well and remains active.  Denies any chest or back pain.  He is on chronic Coumadin for paroxysmal atrial fibrillation and thrombosis of left subclavian vein.  Current Outpatient Medications  Medication Sig Dispense Refill   citalopram (CELEXA) 20 MG tablet TAKE 1 TABLET BY MOUTH EVERY DAY 90 tablet 0   folic acid (FOLVITE) 1 MG tablet Take 1 tablet by mouth daily.     metoprolol tartrate (LOPRESSOR) 25 MG tablet TAKE 1/2 TABLTE BY MOUTH TWICE A DAY 90 tablet 3   Misc Natural Products (GLUCOS-CHONDROIT-MSM COMPLEX) TABS Take 1 tablet by mouth 3 (three) times daily.     Omega-3 Fatty Acids (FISH OIL) 1000 MG CAPS Take 1 capsule by mouth daily.     pantoprazole (PROTONIX) 40 MG tablet TAKE 1 TABLET (40 MG TOTAL) BY MOUTH DAILY AS NEEDED FOR HEARTBURN 30 tablet 0   simvastatin (ZOCOR) 20 MG tablet TAKE 1 TABLET BY MOUTH AT BEDTIME**NEED OFFICE VISIT** 90 tablet 2   warfarin (COUMADIN) 2.5 MG tablet TAKE 2 TABLETS BY MOUTH DAILY EXCEPT TAKE 3 TABS ON TUESDAYS AND SAT OR TAKE AS DIRECTED PER COUMADIN CLINIC 220 tablet 1   No current facility-administered medications for this visit.     Physical Exam: BP 140/66   Pulse (!) 55   Resp 20   Ht 5\' 8"  (1.727 m)   Wt 184 lb (83.5 kg)   SpO2 98% Comment: RA  BMI 27.98 kg/m  He looks well. Cardiac exam shows a regular rate and rhythm with normal heart sounds.  There is no  murmur. Lungs are clear There is no peripheral edema  Diagnostic Tests:  Narrative & Impression  CLINICAL DATA:  Follow-up aortic dissection   EXAM: CT ANGIOGRAPHY CHEST WITH CONTRAST   TECHNIQUE: Multidetector CT imaging of the chest was performed using the standard protocol during bolus administration of intravenous contrast. Multiplanar CT image reconstructions and MIPs were obtained to evaluate the vascular anatomy.   CONTRAST:  108mL ISOVUE-370 IOPAMIDOL (ISOVUE-370) INJECTION 76%   COMPARISON:  CT chest angiogram dated April 11, 2020   FINDINGS: Aorta: Tricuspid aortic valve. Normal caliber aortic root, measuring up to 3.3 cm from cusp to commissure. Prior graft repair of the tubular ascending thoracic aorta with reimplantation of the brachycephalic and left common carotid arteries. No evidence of pseudoaneurysm. Angiograph repair of the aortic arch with patent left subclavian artery stent. Residual dissection flap extends through the descending thoracic aorta into the abdominal aorta outside of the field of view. 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 Upper portion of the descending thoracic aorta measures 2.8 x 2.5 cm, unchanged compared to prior. Lower portion of the descending thoracic aorta measures 2.9 x 2.5 cm, unchanged compared to prior.   Cardiovascular: Normal heart size. No pericardial effusion. Calcifications of the LAD.   Mediastinum/Nodes: No pathologically enlarged lymph nodes in the chest. Thyroid and  esophagus are unremarkable.   Lungs/Pleura: Central airways are patent. No consolidation, pleural effusion or pneumothorax.   Upper Abdomen: Cholelithiasis with no gallbladder wall thickening. No acute findings in the abdomen or pelvis.   Musculoskeletal: Stable L1 compression deformity. Prior median sternotomy with intact sternal wires. No acute osseous abnormality.   Review of the MIP images confirms the above findings.    IMPRESSION: Stable postsurgical changes of ascending thoracic aorta graft repair and stent graft repair of the aortic arch. Unchanged appearance of residual dissection flap and unchanged caliber of the descending thoracic aorta.     Electronically Signed   By: Yetta Glassman M.D.   On: 05/13/2021 12:35      Impression: He continues to do well for years following surgical repair of his aortic dissection.  CTA of the chest shows no change in the size of his descending thoracic aorta.  The aortic root is unchanged.  The residual dissection flap through the descending thoracic aorta and into the abdominal aorta is unchanged.  He has no murmur on exam.  I reviewed the CT images with him and his family and answered their questions.  I stressed the importance of continued good blood pressure control in preventing further enlargement of his aorta and recurrent aortic dissection.  Plan:  He will return in 1 year with a CTA of the chest.  I spent 20 minutes performing this established patient evaluation and > 50% of this time was spent face to face counseling and coordinating the care of this patient's chronic aortic dissection.    Gaye Pollack, MD [facility] [phone]

## 2021-05-15 LAB — COMPREHENSIVE METABOLIC PANEL
Albumin: 3.6 (ref 3.5–5.0)
Calcium: 8.6 — AB (ref 8.7–10.7)

## 2021-05-15 LAB — BASIC METABOLIC PANEL
CO2: 28 — AB (ref 13–22)
Chloride: 107 (ref 99–108)
Glucose: 105
Potassium: 4.6 (ref 3.4–5.3)
Sodium: 140 (ref 137–147)

## 2021-05-15 LAB — HEPATIC FUNCTION PANEL
ALT: 23 (ref 10–40)
AST: 25 (ref 14–40)
Alkaline Phosphatase: 50 (ref 25–125)
Bilirubin, Direct: 0.2 (ref 0.01–0.4)
Bilirubin, Total: 0.6

## 2021-05-15 LAB — HEMOGLOBIN A1C: Hemoglobin A1C: 5.9

## 2021-05-23 ENCOUNTER — Other Ambulatory Visit: Payer: Self-pay | Admitting: Family Medicine

## 2021-05-26 ENCOUNTER — Encounter: Payer: Self-pay | Admitting: Family Medicine

## 2021-05-27 NOTE — Telephone Encounter (Signed)
Noted  

## 2021-05-28 ENCOUNTER — Encounter: Payer: Self-pay | Admitting: Family Medicine

## 2021-05-31 ENCOUNTER — Other Ambulatory Visit: Payer: Self-pay | Admitting: Family Medicine

## 2021-06-02 ENCOUNTER — Encounter: Payer: Self-pay | Admitting: Family Medicine

## 2021-06-08 ENCOUNTER — Ambulatory Visit: Payer: Medicare HMO

## 2021-06-29 ENCOUNTER — Other Ambulatory Visit: Payer: Self-pay | Admitting: Family Medicine

## 2021-07-14 ENCOUNTER — Other Ambulatory Visit: Payer: Self-pay | Admitting: Family Medicine

## 2021-07-16 ENCOUNTER — Telehealth: Payer: Self-pay

## 2021-07-16 NOTE — Telephone Encounter (Signed)
Pt is overdue for INR check. Pt is currently out of town with his daughter who had a liver transplant. She is doing very well. He reports he will be back in town in Jan, he hopes and will call then to schedule an apt. Advised if there was a lab there and he would like to get his INR checked an order could be faxed. Pt said he thinks he will just wait until he is back in Jan. Advised if any changes to contact the coumadin clinic. Pt verbalized understanding.

## 2021-08-09 ENCOUNTER — Other Ambulatory Visit: Payer: Self-pay | Admitting: Family Medicine

## 2021-08-09 DIAGNOSIS — Z7901 Long term (current) use of anticoagulants: Secondary | ICD-10-CM

## 2021-08-09 DIAGNOSIS — I480 Paroxysmal atrial fibrillation: Secondary | ICD-10-CM

## 2021-08-11 NOTE — Telephone Encounter (Signed)
Pt is still out of town with his daughter recovering from a liver transplant. Pt will contact office when back home to make an apt with the coumadin clinic.  Sent in refill.

## 2021-08-24 ENCOUNTER — Ambulatory Visit (INDEPENDENT_AMBULATORY_CARE_PROVIDER_SITE_OTHER): Payer: Medicare Other

## 2021-08-24 DIAGNOSIS — Z7901 Long term (current) use of anticoagulants: Secondary | ICD-10-CM

## 2021-08-24 LAB — POCT INR: INR: 2.2 (ref 2.0–3.0)

## 2021-08-24 NOTE — Progress Notes (Signed)
Continue taking 5mg daily except 7.5mg on Tuesdays and Saturdays. Recheck in 7 weeks. 

## 2021-08-24 NOTE — Patient Instructions (Signed)
Pre visit review using our clinic review tool, if applicable. No additional management support is needed unless otherwise documented below in the visit note. 

## 2021-09-02 ENCOUNTER — Other Ambulatory Visit: Payer: Self-pay | Admitting: Family Medicine

## 2021-09-08 ENCOUNTER — Other Ambulatory Visit: Payer: Self-pay | Admitting: Family Medicine

## 2021-09-08 DIAGNOSIS — I480 Paroxysmal atrial fibrillation: Secondary | ICD-10-CM

## 2021-09-08 DIAGNOSIS — Z7901 Long term (current) use of anticoagulants: Secondary | ICD-10-CM

## 2021-09-08 NOTE — Telephone Encounter (Signed)
Pt is compliant with warfarin management and PCP apts. ?Sent in refill.  ?

## 2021-10-12 ENCOUNTER — Ambulatory Visit (INDEPENDENT_AMBULATORY_CARE_PROVIDER_SITE_OTHER): Payer: Medicare Other | Admitting: Family Medicine

## 2021-10-12 ENCOUNTER — Encounter: Payer: Self-pay | Admitting: Family Medicine

## 2021-10-12 ENCOUNTER — Ambulatory Visit (INDEPENDENT_AMBULATORY_CARE_PROVIDER_SITE_OTHER): Payer: Medicare Other

## 2021-10-12 VITALS — BP 130/60 | HR 59 | Temp 97.7°F | Ht 68.0 in | Wt 187.5 lb

## 2021-10-12 DIAGNOSIS — Z0000 Encounter for general adult medical examination without abnormal findings: Secondary | ICD-10-CM | POA: Diagnosis not present

## 2021-10-12 DIAGNOSIS — Z125 Encounter for screening for malignant neoplasm of prostate: Secondary | ICD-10-CM | POA: Diagnosis not present

## 2021-10-12 DIAGNOSIS — E785 Hyperlipidemia, unspecified: Secondary | ICD-10-CM | POA: Diagnosis not present

## 2021-10-12 DIAGNOSIS — Z7901 Long term (current) use of anticoagulants: Secondary | ICD-10-CM

## 2021-10-12 LAB — CBC WITH DIFFERENTIAL/PLATELET
Basophils Absolute: 0 10*3/uL (ref 0.0–0.1)
Basophils Relative: 0.6 % (ref 0.0–3.0)
Eosinophils Absolute: 0.1 10*3/uL (ref 0.0–0.7)
Eosinophils Relative: 1.9 % (ref 0.0–5.0)
HCT: 43.4 % (ref 39.0–52.0)
Hemoglobin: 15 g/dL (ref 13.0–17.0)
Lymphocytes Relative: 48.7 % — ABNORMAL HIGH (ref 12.0–46.0)
Lymphs Abs: 3.6 10*3/uL (ref 0.7–4.0)
MCHC: 34.6 g/dL (ref 30.0–36.0)
MCV: 91.9 fl (ref 78.0–100.0)
Monocytes Absolute: 0.6 10*3/uL (ref 0.1–1.0)
Monocytes Relative: 8.7 % (ref 3.0–12.0)
Neutro Abs: 2.9 10*3/uL (ref 1.4–7.7)
Neutrophils Relative %: 40.1 % — ABNORMAL LOW (ref 43.0–77.0)
Platelets: 115 10*3/uL — ABNORMAL LOW (ref 150.0–400.0)
RBC: 4.72 Mil/uL (ref 4.22–5.81)
RDW: 14.1 % (ref 11.5–15.5)
WBC: 7.3 10*3/uL (ref 4.0–10.5)

## 2021-10-12 LAB — COMPREHENSIVE METABOLIC PANEL
ALT: 16 U/L (ref 0–53)
AST: 26 U/L (ref 0–37)
Albumin: 4.2 g/dL (ref 3.5–5.2)
Alkaline Phosphatase: 42 U/L (ref 39–117)
BUN: 16 mg/dL (ref 6–23)
CO2: 30 mEq/L (ref 19–32)
Calcium: 9.3 mg/dL (ref 8.4–10.5)
Chloride: 103 mEq/L (ref 96–112)
Creatinine, Ser: 1.13 mg/dL (ref 0.40–1.50)
GFR: 61.7 mL/min (ref >60.00)
Glucose, Bld: 93 mg/dL (ref 70–99)
Potassium: 4.6 mEq/L (ref 3.5–5.1)
Sodium: 139 mEq/L (ref 135–145)
Total Bilirubin: 0.9 mg/dL (ref 0.2–1.2)
Total Protein: 6.8 g/dL (ref 6.0–8.3)

## 2021-10-12 LAB — LIPID PANEL
Cholesterol: 141 mg/dL (ref 0–200)
HDL: 47.9 mg/dL (ref >39.00)
LDL Cholesterol: 74 mg/dL (ref 0–99)
NonHDL: 93.46
Total CHOL/HDL Ratio: 3
Triglycerides: 99 mg/dL (ref 0.0–149.0)
VLDL: 19.8 mg/dL (ref 0.0–40.0)

## 2021-10-12 LAB — POCT INR: INR: 2.6 (ref 2.0–3.0)

## 2021-10-12 LAB — PSA, MEDICARE: PSA: 1.25 ng/ml (ref 0.10–4.00)

## 2021-10-12 NOTE — Progress Notes (Signed)
Continue taking 5mg daily except 7.5mg on Tuesdays and Saturdays. Recheck in 7 weeks. 

## 2021-10-12 NOTE — Progress Notes (Signed)
Acute Office Visit  Subjective:    Patient ID: Don Medina, male    DOB: 05-15-1941, 81 y.o.   MRN: [id number]  Chief Complaint  Patient presents with   Annual Exam    HPI Patient is in today for physical exam.  He still goes to the [place] yearly.  He has history of atrial fibrillation, status post aortic dissection repair, chronic Coumadin therapy, hyperlipidemia, history of adjustment disorder with mixed anxiety and depressed mood.  He remains on Coumadin and followed by Coumadin clinic.  He is on citalopram 20 mg daily and feels like his anxiety symptoms are stable.  Takes low-dose metoprolol and simvastatin.  Needs follow-up lipids.  He continues to go to the [facility] several days per week and exercises regularly.  Generally feels well.  No recent chest pains or dizziness.  Health maintenance reviewed:  -Flu vaccine was given last fall -Shingrix complete -Pneumonia vaccines complete -Tetanus 6/21 -COVID vaccines complete -Aged out of colonoscopy  Social history-married.  Retired for several years.  Quit smoking 1976.  He has a daughter lives in [place] who had liver transplant couple years ago and she is doing remarkably well.  Family history-father had Alzheimer's disease and died age 51.  Mother had history of stroke.  Brother with history of leukemia   Past Medical History:  Diagnosis Date   ADJ DISORDER WITH MIXED ANXIETY & DEPRESSED MOOD 09/08/2009   Qualifier: Diagnosis of  By: Caryl Never MD, Aliou Mealey     Arthritis    Atrial fibrillation (HCC) [I48.91] 05/23/2017   Blood in the stool    BRADYCARDIA 03/10/2010   Qualifier: Diagnosis of  By: Caryl Never MD, Amahri Dengel     CHEST PAIN 03/19/2010   Qualifier: Diagnosis of  By: York, LPN, Christine     Chicken pox    COLONIC POLYPS, HX OF 01/13/2009   Qualifier: Diagnosis of  By: Gabriel Rung LPN, Nancy     Episode of generalized weakness 02/17/2016   GERD (gastroesophageal reflux disease) 10/03/2012   Hay fever    History of colonic  polyps    History of colonoscopy    Hyperlipidemia    MUSCLE STRAIN, HAMSTRING MUSCLE 09/08/2009   Qualifier: Diagnosis of  By: Caryl Never MD, Marisue Ivan DIZZINESS 03/05/2010   Qualifier: Diagnosis of  By: Caryl Never MD, Jaunice Mirza     Other premature beats 03/17/2010   Qualifier: Diagnosis of  By: Ladona Ridgel, MD, Jerrell Mylar    Premature ventricular contraction 01/05/2011   S/P aortic dissection repair 05/08/2017   THROMBOCYTOPENIA 03/05/2010   Qualifier: Diagnosis of  By: Caryl Never MD, Shambria Camerer     Weakness 12/09/2015    Past Surgical History:  Procedure Laterality Date   ABDOMINAL ADHESION SURGERY  2007   AORTIC INTERVENTION N/A 05/08/2017   Procedure: HYPOTHERMIC CIRCULATORY ARREST;  Surgeon: Delight Ovens, MD;  Location: [facility] OR;  Service: Open Heart Surgery;  Laterality: N/A;   ASCENDING AORTIC ROOT REPLACEMENT N/A 05/08/2017   Procedure: REPLACEMENT OF ASCENDING AORTIC ARCH;  Surgeon: Delight Ovens, MD;  Location: [facility] OR;  Service: Open Heart Surgery;  Laterality: N/A;   CANNULATION FOR CARDIOPULMONARY BYPASS Right 05/08/2017   Procedure: AXILLARY CANNULATION;  Surgeon: Delight Ovens, MD;  Location: [facility] OR;  Service: Open Heart Surgery;  Laterality: Right;   ENDOVASCULAR REPAIR/STENT GRAFT  05/08/2017   Procedure: STENT OF LEFT SUBCLAVIAN ARTERY with 10x39VBX;  Surgeon: Delight Ovens, MD;  Location: [facility] OR;  Service: Open Heart Surgery;;  herniated diks surgery L-5  1996   herniated disk surgery c 6-7  1986   INTRAOPERATIVE TRANSESOPHAGEAL ECHOCARDIOGRAM N/A 05/08/2017   Procedure: INTRAOPERATIVE TRANSESOPHAGEAL ECHOCARDIOGRAM;  Surgeon: Delight Ovens, MD;  Location: [facility] OR;  Service: Open Heart Surgery;  Laterality: N/A;   REPLACEMENT ASCENDING AORTA N/A 05/08/2017   Procedure: REPLACEMENT ASCENDING AORTA;  Surgeon: Delight Ovens, MD;  Location: [facility] OR;  Service: Open Heart Surgery;  Laterality: N/A;   THORACIC AORTIC ENDOVASCULAR STENT GRAFT  05/08/2017    Procedure: STENT GRAFTING OF DESCENDING THORACIC AORTA;  Surgeon: Delight Ovens, MD;  Location: [facility] OR;  Service: Open Heart Surgery;;    Family History  Problem Relation Age of Onset   Alzheimer's disease Father 49   Dementia Father    Stroke Mother 65   Cancer Brother        Leukemia   Sudden death Other        family   Hypertension Other    Heart disease Neg Hx     Social History   Socioeconomic History   Marital status: Married    Spouse name: Not on file   Number of children: Not on file   Years of education: Not on file   Highest education level: Not on file  Occupational History   Occupation: Retired  Tobacco Use   Smoking status: Former    Types: Cigarettes    Quit date: 08/09/1974    Years since quitting: 47.2   Smokeless tobacco: Never  Vaping Use   Vaping Use: Never used  Substance and Sexual Activity   Alcohol use: No   Drug use: No   Sexual activity: Not Currently  Other Topics Concern   Not on file  Social History Narrative   Not on file   Social Determinants of Health   Financial Resource Strain: Low Risk    Difficulty of Paying Living Expenses: Not hard at all  Food Insecurity: No Food Insecurity   Worried About Programme researcher, broadcasting/film/video in the Last Year: Never true   Ran Out of Food in the Last Year: Never true  Transportation Needs: No Transportation Needs   Lack of Transportation (Medical): No   Lack of Transportation (Non-Medical): No  Physical Activity: Sufficiently Active   Days of Exercise per Week: 5 days   Minutes of Exercise per Session: 60 min  Stress: No Stress Concern Present   Feeling of Stress : Not at all  Social Connections: Moderately Isolated   Frequency of Communication with Friends and Family: More than three times a week   Frequency of Social Gatherings with Friends and Family: More than three times a week   Attends Religious Services: Never   Database administrator or Organizations: No   Attends Banker  Meetings: Never   Marital Status: Married  Catering manager Violence: Not on file    Outpatient Medications Prior to Visit  Medication Sig Dispense Refill   citalopram (CELEXA) 20 MG tablet TAKE 1 TABLET BY MOUTH EVERY DAY 90 tablet 0   folic acid (FOLVITE) 1 MG tablet Take 1 tablet by mouth daily.     metoprolol tartrate (LOPRESSOR) 25 MG tablet TAKE 1/2 TABLTE BY MOUTH TWICE A DAY 90 tablet 3   Misc Natural Products (GLUCOS-CHONDROIT-MSM COMPLEX) TABS Take 1 tablet by mouth 3 (three) times daily.     Omega-3 Fatty Acids (FISH OIL) 1000 MG CAPS Take 1 capsule by mouth daily.     simvastatin (ZOCOR)  20 MG tablet TAKE 1 TABLET BY MOUTH AT BEDTIME**NEED OFFICE VISIT** 90 tablet 2   warfarin (COUMADIN) 2.5 MG tablet TAKE 2 TABLETS DAILY EXCEPT TAKE 3 TABS ON TUESDAYS AND SAT OR AS DIRECTED PER COUMADIN CLINIC 220 tablet 0   pantoprazole (PROTONIX) 40 MG tablet TAKE 1 TABLET BY MOUTH DAILY AS NEEDED FOR HEARTBURN. 90 tablet 0   No facility-administered medications prior to visit.    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 Recent studies have raised concern that fluoroquinolone antibiotics could be associated with an increased risk of aortic aneurysm Fluoroquinolones have non-antimicrobial properties that might jeopardise the integrity of the extracellular matrix of the vascular wall In a  propensity score matched cohort study in [place], there was a 66% increased rate of aortic aneurysm or dissection associated with oral fluoroquinolone use, compared wit    Review of Systems  Constitutional:  Negative for activity change, appetite change, fatigue and fever.  HENT:  Negative for congestion, ear pain and trouble swallowing.   Eyes:  Negative for pain and visual disturbance.  Respiratory:  Negative for cough, shortness of breath and wheezing.    Cardiovascular:  Negative for chest pain and palpitations.  Gastrointestinal:  Negative for abdominal distention, abdominal pain, blood in stool, constipation, diarrhea, nausea, rectal pain and vomiting.  Genitourinary:  Negative for dysuria, hematuria and testicular pain.  Musculoskeletal:  Negative for arthralgias and joint swelling.  Skin:  Negative for rash.  Neurological:  Negative for dizziness, syncope and headaches.  Hematological:  Negative for adenopathy.  Psychiatric/Behavioral:  Negative for confusion and dysphoric mood.       Objective:    Physical Exam Constitutional:      General: He is not in acute distress.    Appearance: He is well-developed.  HENT:     Head: Normocephalic and atraumatic.     Right Ear: External ear normal.     Left Ear: External ear normal.  Eyes:     Conjunctiva/sclera: Conjunctivae normal.     Pupils: Pupils are equal, round, and reactive to light.  Neck:     Thyroid: No thyromegaly.  Cardiovascular:     Rate and Rhythm: Normal rate and regular rhythm.     Heart sounds: Normal heart sounds.  Pulmonary:     Effort: No respiratory distress.     Breath sounds: No wheezing or rales.  Abdominal:     General: Bowel sounds are normal. There is no distension.     Palpations: Abdomen is soft. There is no mass.     Tenderness: There is no abdominal tenderness. There is no guarding or rebound.  Musculoskeletal:     Cervical back: Normal range of motion and neck supple.     Right lower leg: No edema.     Left lower leg: No edema.  Lymphadenopathy:     Cervical: No cervical adenopathy.  Skin:    Findings: No rash.  Neurological:     Mental Status: He is alert and oriented to person, place, and time.     Cranial Nerves: No cranial nerve deficit.    BP 130/60 (BP Location: Left Arm, Patient Position: Sitting, Cuff Size: Normal)    Pulse (!) 59    Temp 97.7 F (36.5 C) (Oral)    Ht 5\' 8"  (1.727 m)    Wt 187  lb 8 oz (85 kg)    SpO2 95%    BMI  28.51 kg/m  Wt Readings from Last 3 Encounters:  10/12/21 187 lb 8 oz (85 kg)  05/13/21 184 lb (83.5 kg)  03/25/21 184 lb (83.5 kg)    Health Maintenance Due  Topic Date Due   Hepatitis C Screening  Never done    There are no preventive care reminders to display for this patient.   Lab Results  Component Value Date   TSH 5.980 (H) 06/01/2017   Lab Results  Component Value Date   WBC 6.4 05/22/2020   HGB 15.2 05/22/2020   HCT 44.7 05/22/2020   MCV 94.1 05/22/2020   PLT 110 (L) 05/22/2020   Lab Results  Component Value Date   NA 140 05/15/2021   K 4.6 05/15/2021   CO2 28 (A) 05/15/2021   GLUCOSE 98 05/22/2020   BUN 19 05/22/2020   CREATININE 1.10 05/22/2020   BILITOT 0.7 05/22/2020   ALKPHOS 50 05/15/2021   AST 25 05/15/2021   ALT 23 05/15/2021   PROT 6.7 05/22/2020   ALBUMIN 3.6 05/15/2021   CALCIUM 8.6 (A) 05/15/2021   ANIONGAP 11 09/17/2017   GFR 62.51 05/02/2018   Lab Results  Component Value Date   CHOL 145 05/22/2020   Lab Results  Component Value Date   HDL 49 05/22/2020   Lab Results  Component Value Date   LDLCALC 76 05/22/2020   Lab Results  Component Value Date   TRIG 117 05/22/2020   Lab Results  Component Value Date   CHOLHDL 3.0 05/22/2020   Lab Results  Component Value Date   HGBA1C 5.9 05/15/2021       Assessment & Plan:   Problem List Items Addressed This Visit   None Visit Diagnoses     Physical exam    -  Primary   Relevant Orders   Lipid panel   CBC with Differential/Platelet   CMP   Prostate cancer screening       Relevant Orders   PSA, Medicare     The natural history of prostate cancer and ongoing controversy regarding screening and potential treatment outcomes of prostate cancer has been discussed with the patient. The meaning of a false positive PSA and a false negative PSA has been discussed. He indicates understanding of the limitations of this screening test and wishes to proceed with screening PSA  testing.  -Continue annual flu vaccine  -Obtain follow-up labs as above  -Continue regular exercise habits  -Aged out of colonoscopies    No orders of the defined types were placed in this encounter.    Evelena Peat, MD

## 2021-10-12 NOTE — Patient Instructions (Addendum)
Pre visit review using our clinic review tool, if applicable. No additional management support is needed unless otherwise documented below in the visit note.  Continue taking 5mg daily except 7.5mg on Tuesdays and Saturdays. Recheck in 7 weeks. 

## 2021-10-29 ENCOUNTER — Encounter: Payer: Self-pay | Admitting: Family Medicine

## 2021-11-30 ENCOUNTER — Ambulatory Visit: Payer: Medicare Other

## 2021-11-30 DIAGNOSIS — Z7901 Long term (current) use of anticoagulants: Secondary | ICD-10-CM

## 2021-11-30 LAB — POCT INR: INR: 2.7 (ref 2.0–3.0)

## 2021-11-30 NOTE — Progress Notes (Signed)
Continue taking 5mg daily except 7.5mg on Tuesdays and Saturdays. Recheck in 6 weeks. 

## 2021-11-30 NOTE — Patient Instructions (Addendum)
Pre visit review using our clinic review tool, if applicable. No additional management support is needed unless otherwise documented below in the visit note.  Continue taking 5mg daily except 7.5mg on Tuesdays and Saturdays. Recheck in 6 weeks. 

## 2021-12-12 ENCOUNTER — Other Ambulatory Visit: Payer: Self-pay | Admitting: Family Medicine

## 2021-12-12 DIAGNOSIS — Z7901 Long term (current) use of anticoagulants: Secondary | ICD-10-CM

## 2021-12-12 DIAGNOSIS — I480 Paroxysmal atrial fibrillation: Secondary | ICD-10-CM

## 2021-12-14 NOTE — Telephone Encounter (Signed)
Pt is compliant with warfarin management and PCP apts. ?Sent in refill.  ?

## 2022-01-07 ENCOUNTER — Telehealth: Payer: Self-pay

## 2022-01-07 NOTE — Telephone Encounter (Signed)
Pt scheduled for coumadin clinic on 6/5. No nurse available for 6/5.  LVM pt needs to RS for 6/7 in the afternoon or the following Monday.

## 2022-01-11 ENCOUNTER — Ambulatory Visit: Payer: Medicare Other

## 2022-01-18 ENCOUNTER — Ambulatory Visit: Payer: Medicare Other

## 2022-01-18 DIAGNOSIS — Z7901 Long term (current) use of anticoagulants: Secondary | ICD-10-CM

## 2022-01-18 LAB — POCT INR: INR: 2.2 (ref 2.0–3.0)

## 2022-01-18 NOTE — Patient Instructions (Addendum)
Pre visit review using our clinic review tool, if applicable. No additional management support is needed unless otherwise documented below in the visit note.  Continue taking 5mg daily except 7.5mg on Tuesdays and Saturdays. Recheck in 6 weeks. 

## 2022-01-18 NOTE — Progress Notes (Unsigned)
Continue taking 5mg daily except 7.5mg on Tuesdays and Saturdays. Recheck in 6 weeks. 

## 2022-03-01 ENCOUNTER — Ambulatory Visit: Payer: Medicare Other

## 2022-03-01 DIAGNOSIS — Z7901 Long term (current) use of anticoagulants: Secondary | ICD-10-CM

## 2022-03-01 LAB — POCT INR: INR: 2.1 (ref 2.0–3.0)

## 2022-03-01 NOTE — Patient Instructions (Addendum)
Pre visit review using our clinic review tool, if applicable. No additional management support is needed unless otherwise documented below in the visit note.  Continue taking '5mg'$  daily except 7.'5mg'$  on Tuesdays and Saturdays. Recheck in 7 weeks.

## 2022-03-01 NOTE — Progress Notes (Signed)
Continue taking '5mg'$  daily except 7.'5mg'$  on Tuesdays and Saturdays. Recheck in 7 weeks.

## 2022-03-15 ENCOUNTER — Ambulatory Visit: Payer: Medicare Other | Admitting: Family Medicine

## 2022-03-15 VITALS — BP 160/70 | HR 64 | Temp 97.4°F | Ht 68.0 in | Wt 181.5 lb

## 2022-03-15 DIAGNOSIS — I10 Essential (primary) hypertension: Secondary | ICD-10-CM

## 2022-03-15 DIAGNOSIS — I4891 Unspecified atrial fibrillation: Secondary | ICD-10-CM | POA: Diagnosis not present

## 2022-03-15 HISTORY — DX: Essential (primary) hypertension: I10

## 2022-03-15 MED ORDER — LOSARTAN POTASSIUM 50 MG PO TABS: 50.0000 mg | ORAL_TABLET | Freq: Every day | ORAL | 1 refills | Status: DC

## 2022-03-15 NOTE — Progress Notes (Signed)
Established Patient Office Visit  Subjective   Patient ID: Don Medina, male    DOB: October 11, 1940  Age: 81 y.o. MRN: [id number]  Chief Complaint  Patient presents with   Hypertension    HPI   Here with just couple day history of elevated blood pressures.  Past medical history is significant for history of atrial fibrillation, status post aortic dissection repair five years ago, hyperlipidemia  Currently takes Lopressor just 25 mg 1/2 tablet twice daily. Blood pressures generally been very well-controlled.  He had recent reading around 150/70 and another reading around 160/70.  This was taken at a couple of different places including [facility] and at home.  He does not use any regular alcohol.  Has been drinking about 8 to 10 ounces of Gatorade per day.  No recent chest pains.  No peripheral edema.  Still exercises regularly.  He is still followed at the [place] and gets regular labs through them.  He remains on chronic Coumadin therapy and is followed through Coumadin clinic.  Past Medical History:  Diagnosis Date   ADJ DISORDER WITH MIXED ANXIETY & DEPRESSED MOOD 09/08/2009   Qualifier: Diagnosis of  By: Caryl Never MD, Herman Fiero     Arthritis    Atrial fibrillation (HCC) [I48.91] 05/23/2017   Blood in the stool    BRADYCARDIA 03/10/2010   Qualifier: Diagnosis of  By: Caryl Never MD, Tayte Childers     CHEST PAIN 03/19/2010   Qualifier: Diagnosis of  By: York, LPN, Christine     Chicken pox    COLONIC POLYPS, HX OF 01/13/2009   Qualifier: Diagnosis of  By: Gabriel Rung LPN, Nancy     Episode of generalized weakness 02/17/2016   GERD (gastroesophageal reflux disease) 10/03/2012   Hay fever    History of colonic polyps    History of colonoscopy    Hyperlipidemia    MUSCLE STRAIN, HAMSTRING MUSCLE 09/08/2009   Qualifier: Diagnosis of  By: Caryl Never MD, Marisue Ivan DIZZINESS 03/05/2010   Qualifier: Diagnosis of  By: Caryl Never MD, Iolani Twilley     Other premature beats 03/17/2010   Qualifier: Diagnosis  of  By: Ladona Ridgel, MD, Jerrell Mylar    Premature ventricular contraction 01/05/2011   S/P aortic dissection repair 05/08/2017   THROMBOCYTOPENIA 03/05/2010   Qualifier: Diagnosis of  By: Caryl Never MD, Janese Radabaugh     Weakness 12/09/2015   Past Surgical History:  Procedure Laterality Date   ABDOMINAL ADHESION SURGERY  2007   AORTIC INTERVENTION N/A 05/08/2017   Procedure: HYPOTHERMIC CIRCULATORY ARREST;  Surgeon: Delight Ovens, MD;  Location: [facility] OR;  Service: Open Heart Surgery;  Laterality: N/A;   ASCENDING AORTIC ROOT REPLACEMENT N/A 05/08/2017   Procedure: REPLACEMENT OF ASCENDING AORTIC ARCH;  Surgeon: Delight Ovens, MD;  Location: [facility] OR;  Service: Open Heart Surgery;  Laterality: N/A;   CANNULATION FOR CARDIOPULMONARY BYPASS Right 05/08/2017   Procedure: AXILLARY CANNULATION;  Surgeon: Delight Ovens, MD;  Location: [facility] OR;  Service: Open Heart Surgery;  Laterality: Right;   ENDOVASCULAR REPAIR/STENT GRAFT  05/08/2017   Procedure: STENT OF LEFT SUBCLAVIAN ARTERY with 10x39VBX;  Surgeon: Delight Ovens, MD;  Location: [facility] OR;  Service: Open Heart Surgery;;   herniated diks surgery L-5  1996   herniated disk surgery c 6-7  1986   INTRAOPERATIVE TRANSESOPHAGEAL ECHOCARDIOGRAM N/A 05/08/2017   Procedure: INTRAOPERATIVE TRANSESOPHAGEAL ECHOCARDIOGRAM;  Surgeon: Delight Ovens, MD;  Location: [facility] OR;  Service: Open Heart Surgery;  Laterality: N/A;  REPLACEMENT ASCENDING AORTA N/A 05/08/2017   Procedure: REPLACEMENT ASCENDING AORTA;  Surgeon: Delight Ovens, MD;  Location: [facility] OR;  Service: Open Heart Surgery;  Laterality: N/A;   THORACIC AORTIC ENDOVASCULAR STENT GRAFT  05/08/2017   Procedure: STENT GRAFTING OF DESCENDING THORACIC AORTA;  Surgeon: Delight Ovens, MD;  Location: [facility] OR;  Service: Open Heart Surgery;;    reports that he quit smoking about 47 years ago. His smoking use included cigarettes. He has never used smokeless tobacco. He reports that he does not drink  alcohol and does not use drugs. family history includes Alzheimer's disease (age of onset: 50) in his father; Cancer in his brother; Dementia in his father; Hypertension in an other family member; Stroke (age of onset: 69) in his mother; Sudden death in an other family member. 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 Recent studies have raised concern that fluoroquinolone antibiotics could be associated with an increased risk of aortic aneurysm Fluoroquinolones have non-antimicrobial properties that might jeopardise the integrity of the extracellular matrix of the vascular wall In a  propensity score matched cohort study in [place], there was a 66% increased rate of aortic aneurysm or dissection associated with oral fluoroquinolone use, compared wit    Review of Systems  Constitutional:  Negative for malaise/fatigue.  Eyes:  Negative for blurred vision.  Respiratory:  Negative for shortness of breath.   Cardiovascular:  Negative for chest pain.  Neurological:  Negative for dizziness, weakness and headaches.      Objective:     BP (!) 160/70 (BP Location: Left Arm, Patient Position: Sitting, Cuff Size: Normal)   Pulse 64   Temp (!) 97.4 F (36.3 C) (Oral)   Ht 5\' 8"  (1.727 m)   Wt 181 lb 8 oz (82.3 kg)   SpO2 99%   BMI 27.60 kg/m  BP Readings from Last 3 Encounters:  03/15/22 (!) 160/70  10/12/21 130/60  05/13/21 140/66   Wt Readings from Last 3 Encounters:  03/15/22 181 lb 8 oz (82.3 kg)  10/12/21 187 lb 8 oz (85 kg)  05/13/21 184 lb (83.5 kg)      Physical Exam Vitals reviewed.  Constitutional:      Appearance: Normal appearance.  Cardiovascular:     Rate and Rhythm: Normal rate.  Pulmonary:     Effort: Pulmonary effort is normal.     Breath sounds: Normal breath sounds. No wheezing or rales.  Musculoskeletal:     Right  lower leg: No edema.     Left lower leg: No edema.  Neurological:     Mental Status: He is alert.      No results found for any visits on 03/15/22.    The ASCVD Risk score (Arnett DK, et al., 2019) failed to calculate for the following reasons:   The 2019 ASCVD risk score is only valid for ages 41 to 106    Assessment & Plan:   #1 hypertension poorly controlled.  Past history of aortic dissection repair.  Currently on Lopressor 25 mg 1/2 tablet twice daily. -Keep daily sodium intake less than 2500 mg -Continue regular exercise habits -Add losartan 50 mg once daily -Set up office follow-up in about 3 weeks to reassess and check basic metabolic panel then.  #2 history of atrial fibrillation.  Patient on chronic Coumadin.  Previous aortic  dissection repair as above.  Return in about 3 weeks (around 04/05/2022).    Evelena Peat, MD

## 2022-03-15 NOTE — Patient Instructions (Signed)
Set up follow up in 2-3 months.

## 2022-03-18 ENCOUNTER — Encounter: Payer: Self-pay | Admitting: Family Medicine

## 2022-03-22 ENCOUNTER — Encounter: Payer: Self-pay | Admitting: Cardiovascular Disease

## 2022-03-22 ENCOUNTER — Encounter: Payer: Self-pay | Admitting: Surgery

## 2022-03-25 ENCOUNTER — Encounter: Payer: Self-pay | Admitting: Family Medicine

## 2022-03-26 MED ORDER — LOSARTAN POTASSIUM-HCTZ 100-12.5 MG PO TABS: 1.0000 | ORAL_TABLET | Freq: Every day | ORAL | 0 refills | Status: DC

## 2022-03-26 NOTE — Addendum Note (Signed)
Addended by: Nilda Riggs on: 03/26/2022 01:31 PM   Modules accepted: Orders

## 2022-03-30 ENCOUNTER — Ambulatory Visit: Payer: Medicare Other | Admitting: Family Medicine

## 2022-03-30 ENCOUNTER — Encounter: Payer: Self-pay | Admitting: Family Medicine

## 2022-03-30 VITALS — BP 120/60 | HR 59 | Temp 97.4°F | Ht 68.0 in | Wt 177.8 lb

## 2022-03-30 DIAGNOSIS — I10 Essential (primary) hypertension: Secondary | ICD-10-CM | POA: Diagnosis not present

## 2022-03-30 LAB — BASIC METABOLIC PANEL
BUN: 18 mg/dL (ref 6–23)
CO2: 28 mEq/L (ref 19–32)
Calcium: 9.7 mg/dL (ref 8.4–10.5)
Chloride: 100 mEq/L (ref 96–112)
Creatinine, Ser: 1.15 mg/dL (ref 0.40–1.50)
GFR: 60.22 mL/min (ref >60.00)
Glucose, Bld: 109 mg/dL — ABNORMAL HIGH (ref 70–99)
Potassium: 4 mEq/L (ref 3.5–5.1)
Sodium: 137 mEq/L (ref 135–145)

## 2022-03-30 NOTE — Progress Notes (Signed)
Established Patient Office Visit  Subjective   Patient ID: Don Medina, male    DOB: [birth year]  Age: 81 y.o. MRN: [id number]  Chief Complaint  Patient presents with   Blood Pressure Check    HPI   Here for follow-up hypertension.  He was here recently with reading around 160 systolic.  We started plain losartan 50 mg daily.  He called back several days later with still having significantly elevated readings and we switched to losartan/HCTZ 100/12.5 mg.  Since that time his blood pressure has been greatly improved.  Systolics usually run 130 and diastolics in the 60s.  Occasional lightheadedness.  No syncope.  Has also lost a few pounds due to his efforts.  Also remains on low-dose Lopressor  Past Medical History:  Diagnosis Date   ADJ DISORDER WITH MIXED ANXIETY & DEPRESSED MOOD 09/08/2009   Qualifier: Diagnosis of  By: Caryl Never MD, Georgiann Neider     Arthritis    Atrial fibrillation (HCC) [I48.91] 05/23/2017   Blood in the stool    BRADYCARDIA 03/10/2010   Qualifier: Diagnosis of  By: Caryl Never MD, Unnamed Zeien     CHEST PAIN 03/19/2010   Qualifier: Diagnosis of  By: York, LPN, Christine     Chicken pox    COLONIC POLYPS, HX OF 01/13/2009   Qualifier: Diagnosis of  By: Gabriel Rung LPN, Nancy     Episode of generalized weakness 02/17/2016   Essential hypertension 03/15/2022   GERD (gastroesophageal reflux disease) 10/03/2012   Hay fever    History of colonic polyps    History of colonoscopy    Hyperlipidemia    MUSCLE STRAIN, HAMSTRING MUSCLE 09/08/2009   Qualifier: Diagnosis of  By: Caryl Never MD, Marisue Ivan DIZZINESS 03/05/2010   Qualifier: Diagnosis of  By: Caryl Never MD, [place] Espina     Other premature beats 03/17/2010   Qualifier: Diagnosis of  By: Ladona Ridgel, MD, Jerrell Mylar    Premature ventricular contraction 01/05/2011   S/P aortic dissection repair 05/08/2017   THROMBOCYTOPENIA 03/05/2010   Qualifier: Diagnosis of  By: Caryl Never MD, Prithvi Kooi     Weakness 12/09/2015   Past  Surgical History:  Procedure Laterality Date   ABDOMINAL ADHESION SURGERY  2007   AORTIC INTERVENTION N/A 05/08/2017   Procedure: HYPOTHERMIC CIRCULATORY ARREST;  Surgeon: Delight Ovens, MD;  Location: [facility] OR;  Service: Open Heart Surgery;  Laterality: N/A;   ASCENDING AORTIC ROOT REPLACEMENT N/A 05/08/2017   Procedure: REPLACEMENT OF ASCENDING AORTIC ARCH;  Surgeon: Delight Ovens, MD;  Location: [facility] OR;  Service: Open Heart Surgery;  Laterality: N/A;   CANNULATION FOR CARDIOPULMONARY BYPASS Right 05/08/2017   Procedure: AXILLARY CANNULATION;  Surgeon: Delight Ovens, MD;  Location: [facility] OR;  Service: Open Heart Surgery;  Laterality: Right;   ENDOVASCULAR REPAIR/STENT GRAFT  05/08/2017   Procedure: STENT OF LEFT SUBCLAVIAN ARTERY with 10x39VBX;  Surgeon: Delight Ovens, MD;  Location: [facility] OR;  Service: Open Heart Surgery;;   herniated diks surgery L-5  1996   herniated disk surgery c 6-7  1986   INTRAOPERATIVE TRANSESOPHAGEAL ECHOCARDIOGRAM N/A 05/08/2017   Procedure: INTRAOPERATIVE TRANSESOPHAGEAL ECHOCARDIOGRAM;  Surgeon: Delight Ovens, MD;  Location: [facility] OR;  Service: Open Heart Surgery;  Laterality: N/A;   REPLACEMENT ASCENDING AORTA N/A 05/08/2017   Procedure: REPLACEMENT ASCENDING AORTA;  Surgeon: Delight Ovens, MD;  Location: [facility] OR;  Service: Open Heart Surgery;  Laterality: N/A;   THORACIC AORTIC ENDOVASCULAR STENT GRAFT  05/08/2017   Procedure: STENT  GRAFTING OF DESCENDING THORACIC AORTA;  Surgeon: Delight Ovens, MD;  Location: [facility] OR;  Service: Open Heart Surgery;;    reports that he quit smoking about 47 years ago. His smoking use included cigarettes. He has never used smokeless tobacco. He reports that he does not drink alcohol and does not use drugs. family history includes Alzheimer's disease (age of onset: 70) in his father; Cancer in his brother; Dementia in his father; Hypertension in an other family member; Stroke (age of onset: 26) in his mother; Sudden  death in an other family member. 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 Recent studies have raised concern that fluoroquinolone antibiotics could be associated with an increased risk of aortic aneurysm Fluoroquinolones have non-antimicrobial properties that might jeopardise the integrity of the extracellular matrix of the vascular wall In a  propensity score matched cohort study in [place], there was a 66% increased rate of aortic aneurysm or dissection associated with oral fluoroquinolone use, compared wit    Review of Systems  Constitutional:  Negative for malaise/fatigue.  Eyes:  Negative for blurred vision.  Respiratory:  Negative for shortness of breath.   Cardiovascular:  Negative for chest pain.  Neurological:  Negative for weakness and headaches.      Objective:     BP 120/60 (BP Location: Left Arm, Cuff Size: Normal)   Pulse (!) 59   Temp (!) 97.4 F (36.3 C) (Oral)   Ht 5\' 8"  (1.727 m)   Wt 177 lb 12.8 oz (80.6 kg)   SpO2 98%   BMI 27.03 kg/m  BP Readings from Last 3 Encounters:  03/30/22 120/60  03/15/22 (!) 160/70  10/12/21 130/60   Wt Readings from Last 3 Encounters:  03/30/22 177 lb 12.8 oz (80.6 kg)  03/15/22 181 lb 8 oz (82.3 kg)  10/12/21 187 lb 8 oz (85 kg)      Physical Exam Vitals reviewed.  Constitutional:      Appearance: Normal appearance.  Neck:     Comments: No carotid bruits Cardiovascular:     Rate and Rhythm: Normal rate and regular rhythm.  Pulmonary:     Effort: Pulmonary effort is normal.     Breath sounds: Normal breath sounds.  Musculoskeletal:     Right lower leg: No edema.     Left lower leg: No edema.  Neurological:     Mental Status: He is alert.      No results found for any visits on 03/30/22.    The ASCVD Risk score (Arnett DK, et al., 2019) failed to calculate for the  following reasons:   The 2019 ASCVD risk score is only valid for ages 43 to 63    Assessment & Plan:   Problem List Items Addressed This Visit   None Visit Diagnoses     Hypertension, unspecified type    -  Primary   Relevant Orders   Basic metabolic panel     Greatly improved.  Seated blood pressure 120/60 and standing 126/60 -Continue losartan/HCTZ 100/12.5 mg 1 daily -Check basic metabolic panel -Try to keep daily sodium intake less than 2500 mg -Set up 63-month follow-up  Return in about 6 months (around 09/30/2022).    Evelena Peat, MD

## 2022-04-07 ENCOUNTER — Other Ambulatory Visit: Payer: Self-pay | Admitting: Family Medicine

## 2022-04-08 ENCOUNTER — Other Ambulatory Visit: Payer: Self-pay | Admitting: Family Medicine

## 2022-04-08 DIAGNOSIS — I480 Paroxysmal atrial fibrillation: Secondary | ICD-10-CM

## 2022-04-08 DIAGNOSIS — Z7901 Long term (current) use of anticoagulants: Secondary | ICD-10-CM

## 2022-04-08 NOTE — Telephone Encounter (Signed)
Pt is compliant with warfarin management and PCP apts. ?Sent in refill.  ?

## 2022-04-09 ENCOUNTER — Encounter: Payer: Self-pay | Admitting: Family Medicine

## 2022-04-14 ENCOUNTER — Encounter: Payer: Self-pay | Admitting: Family Medicine

## 2022-04-15 ENCOUNTER — Other Ambulatory Visit: Payer: Self-pay | Admitting: *Deleted

## 2022-04-15 DIAGNOSIS — Z9889 Other specified postprocedural states: Secondary | ICD-10-CM

## 2022-04-15 MED ORDER — LOSARTAN POTASSIUM 100 MG PO TABS: 100.0000 mg | ORAL_TABLET | Freq: Every day | ORAL | 5 refills | Status: DC

## 2022-04-19 ENCOUNTER — Ambulatory Visit: Payer: Medicare Other

## 2022-04-21 ENCOUNTER — Encounter: Payer: Self-pay | Admitting: Family Medicine

## 2022-04-26 ENCOUNTER — Ambulatory Visit: Payer: Medicare Other

## 2022-04-26 DIAGNOSIS — Z7901 Long term (current) use of anticoagulants: Secondary | ICD-10-CM

## 2022-04-26 LAB — POCT INR: INR: 2.2 (ref 2.0–3.0)

## 2022-04-26 NOTE — Patient Instructions (Addendum)
Pre visit review using our clinic review tool, if applicable. No additional management support is needed unless otherwise documented below in the visit note.  Continue taking 5mg daily except 7.5mg on Tuesdays and Saturdays. Recheck in 6 weeks. 

## 2022-04-26 NOTE — Progress Notes (Addendum)
Continue taking '5mg'$  daily except 7.'5mg'$  on Tuesdays and Saturdays. Recheck in 6 weeks.  Pt requested BP check. Added to vitals tab.

## 2022-05-17 ENCOUNTER — Other Ambulatory Visit: Payer: Self-pay | Admitting: Family Medicine

## 2022-05-18 ENCOUNTER — Encounter: Payer: Self-pay | Admitting: Family Medicine

## 2022-05-19 ENCOUNTER — Ambulatory Visit: Payer: Medicare Other | Admitting: Surgery

## 2022-05-19 ENCOUNTER — Ambulatory Visit
Admission: RE | Admit: 2022-05-19 | Discharge: 2022-05-19 | Disposition: A | Discharge status: Home / Self Care | Payer: Medicare Other | Source: Ambulatory Visit | Attending: Surgery | Admitting: Surgery

## 2022-05-19 ENCOUNTER — Encounter: Payer: Self-pay | Admitting: Surgery

## 2022-05-19 VITALS — BP 138/70 | HR 53 | Ht 68.0 in | Wt 180.0 lb

## 2022-05-19 DIAGNOSIS — Z9889 Other specified postprocedural states: Secondary | ICD-10-CM

## 2022-05-19 DIAGNOSIS — Z8679 Personal history of other diseases of the circulatory system: Secondary | ICD-10-CM

## 2022-05-19 MED ORDER — IOPAMIDOL (ISOVUE-370) INJECTION 76%
75.0000 mL | Freq: Once | INTRAVENOUS | Status: AC | PRN
  Administered 2022-05-19: 75 mL via INTRAVENOUS

## 2022-05-19 NOTE — Progress Notes (Signed)
HPI:  The patient is a 81 year old gentleman who underwent repair of an acute type 1 aortic dissection by Dr. Servando Snare on 05/08/2017.  He had resuspension repair of the aortic valve with replacement of the ascending aorta, arch de-branching, and frozen elephant trunk with placement of a 31 x 31 x 10 cm CTAG Gore stent graft, placement of a 10 x 95 VBX balloon expandable stent into the left subclavian artery, a 12 x 8-cm graft off the ascending aorta to the innominate and left carotid arteries.  He continues to feel well and goes to the gym 5 days/week.  He denies any chest or back pain.  He has had no shortness of breath or peripheral edema.  Current Outpatient Medications  Medication Sig Dispense Refill   citalopram (CELEXA) 20 MG tablet TAKE 1 TABLET BY MOUTH EVERY DAY 90 tablet 2   folic acid (FOLVITE) 1 MG tablet Take 1 tablet by mouth daily.     metoprolol tartrate (LOPRESSOR) 25 MG tablet TAKE 1/2 TABLTE BY MOUTH TWICE A DAY 90 tablet 3   Misc Natural Products (GLUCOS-CHONDROIT-MSM COMPLEX) TABS Take 1 tablet by mouth 3 (three) times daily.     Omega-3 Fatty Acids (FISH OIL) 1000 MG CAPS Take 1 capsule by mouth daily.     simvastatin (ZOCOR) 20 MG tablet TAKE 1 TABLET BY MOUTH AT BEDTIME**NEED OFFICE VISIT** 90 tablet 2   warfarin (COUMADIN) 2.5 MG tablet TAKE 2 TABLETS BY MOUTH DAILY EXCEPT 3 TABS ON TUESDAYS & SATURDAYS OR AS DIRECTED BY ANTICOAGULATION CLINIC 220 tablet 1   losartan (COZAAR) 100 MG tablet Take 1 tablet (100 mg total) by mouth daily. 30 tablet 5   No current facility-administered medications for this visit.     Physical Exam: BP 138/70   Pulse (!) 53   Ht '5\' 8"'$  (1.727 m)   Wt 180 lb (81.6 kg)   SpO2 96% Comment: RA  BMI 27.37 kg/m  He looks well. Cardiac exam shows regular rate and rhythm with a 2/6 systolic flow murmur along the right sternal border.  There is no diastolic murmur. Lungs are clear.   Diagnostic Tests:  Narrative & Impression  CLINICAL  DATA:  Thoracic aortic aneurysm status post repair   EXAM: CT ANGIOGRAPHY CHEST, ABDOMEN AND PELVIS   TECHNIQUE: Non-contrast CT of the chest was initially obtained.   Multidetector CT imaging through the chest, abdomen and pelvis was performed using the standard protocol during bolus administration of intravenous contrast. Multiplanar reconstructed images and MIPs were obtained and reviewed to evaluate the vascular anatomy.   RADIATION DOSE REDUCTION: This exam was performed according to the departmental dose-optimization program which includes automated exposure control, adjustment of the mA and/or kV according to patient size and/or use of iterative reconstruction technique.   CONTRAST:  81m ISOVUE-370 IOPAMIDOL (ISOVUE-370) INJECTION 76%   COMPARISON:  05/13/2021 CT angiography chest   FINDINGS: CTA CHEST FINDINGS   Cardiovascular:  Heart size is within normal limits.   Aortic root is normal in size measuring 3.3 x 3.1 x 2.9 cm at the level of the sinus of Valsalva.   Postsurgical changes of ascending aortic aneurysm repair again seen. No recurrent ascending aortic aneurysm is identified.   Right brachiocephalic and left common carotid arteries have been reimplanted and share a common origin. No aneurysm or stenosis of the right brachiocephalic or left common carotid arteries.   Small outpouching ridging from the right axillary artery and extending anteriorly is most likely an abandoned  ax fem bypass graft.   Endograft stent extending into the left subclavian artery origin is patent without significant stenosis. Visualized segments of the left vertebral and upper extremity arteries are patent without significant stenosis.   Endograft stent originating from the distal aortic arch and terminating in the proximal descending thoracic aorta is patent. No recurrent aneurysm or dissection identified within this segment. There is no evidence of endoleak on arterial phase  images. Evaluation is limited due to lack of delayed images.   The maximum diameter of the proximal descending thoracic aorta measures 3.1 x 2.9 cm, which is unchanged from prior examination from 2020.   Mediastinum/Nodes: No enlarged mediastinal, hilar, or axillary lymph nodes. Thyroid gland, trachea, and esophagus demonstrate no significant findings.   Lungs/Pleura: 4 mm nodule within the right minor fissure is unchanged since 2020 which indicates a benign etiology and does not require further imaging follow-up. Lungs otherwise clear.   Musculoskeletal: No chest wall abnormality. No acute or significant osseous findings.   Review of the MIP images confirms the above findings.   CTA ABDOMEN AND PELVIS FINDINGS   VASCULAR   Aorta: Descending thoracic aortic dissection continues into the abdominal aorta and terminates at the level of the bifurcation.   Multiple fenestrations are seen within the dissection flap.   Celiac: Originates from the true lumen. Mild stenosis of the origin.   SMA: The radius from the true lumen.  No significant stenosis.   Renals: Right main renal artery is from the true lumen. No significant stenosis. The left main renal artery originates from both the true and false lumen. It is patent without significant stenosis.   IMA: Originates from the true lumen and is patent.   Inflow:   Right lower extremity: Right common iliac artery is dilated measuring up to 1.7 cm, unchanged from prior examination from 2020. No significant stenosis of the common, internal, or external iliac arteries.   Dilated left common iliac artery measuring up to 1.7 cm is unchanged from prior examination from 2020. No significant stenosis of the common, internal common external iliac arteries.   Veins: No obvious venous abnormality within the limitations of this arterial phase study.   Review of the MIP images confirms the above findings.   NON-VASCULAR    Hepatobiliary: Minimal cholelithiasis. Liver, gallbladder, and bile ducts otherwise normal.   Pancreas: Unremarkable. No pancreatic ductal dilatation or surrounding inflammatory changes.   Spleen: Normal in size without focal abnormality.   Adrenals/Urinary Tract: Adrenal glands are normal. Thinning of the cortex of the lower pole of the right kidney likely result of prior ischemic or inflammatory etiology. Kidneys, ureters, and bladder otherwise normal.   Stomach/Bowel: Stomach is within normal limits. Appendix appears normal. No evidence of bowel wall thickening, distention, or inflammatory changes.   Lymphatic: No enlarged abdominal or pelvic lymph nodes.   Reproductive: Moderately enlarged prostate impressing upon the bladder neck.   Other: Small fat containing bilateral inguinal hernias.   Musculoskeletal: Mild to moderate L1 compression fracture is unchanged since 09/17/2017. Degenerative changes seen throughout the lumbar spine.   Review of the MIP images confirms the above findings.   IMPRESSION: 1. Unchanged postsurgical appearance of the ascending thoracic aorta status post replacement. No recurrent aneurysm. 2. Unchanged appearance of aortic arch and descending thoracic aorta status post endograft repair. 3. Unchanged extent and size of descending thoracic aortic dissection. 4. Unchanged dissection of the abdominal aorta. 5. Unchanged ectasia of bilateral common iliac arteries measuring up to 1.7 cm  bilaterally. 6. Minimal cholelithiasis.     Electronically Signed   By: Miachel Roux M.D.   On: 05/19/2022 10:32      Impression:  Mr. Kobus continues to do well following his dissection repair in 2018.  CT of the chest shows stable appearance of the residual native aorta as well as his stent graft.  I reviewed the CTA images with him and answered all of his questions.  I stressed the importance of continued good blood pressure control in preventing  further enlargement of his aorta and recurrent aortic dissection.  Plan:  I will plan to see him back in 1 year with a CTA of the chest, abdomen, and pelvis for aortic surveillance.  I spent 20 minutes performing this established patient evaluation and > 50% of this time was spent face to face counseling and coordinating the surveillance of his aorta status post repair of acute type I aortic dissection.  Gaye Pollack, MD [facility] [phone]

## 2022-05-31 NOTE — Progress Notes (Signed)
CARDIOLOGY OFFICE NOTE  Date:  06/04/2022    Don Medina Date of Birth: 1941-06-11 Medical Record #[id number]  PCP:  Don Post, MD  Cardiologist:  Don Medina   No chief complaint on file.    History of Present Illness: Don Medina is a 81 y.o. male seen f/u  for acute type A aortic dissection.    He has a history of HLD and PVCs. No history of CAD, heart attack, HLD or DM. No prior tobacco use. Had new onset chest and back pain September 2018 with dissection BP managed by primary now on beta blocker, diuretic and ARB    He was taken to the OR by Dr. Servando Snare 05/08/17 he underwent grafting of the ascending aorta above the sinus with resuspension of AV  Right innominate tied off. Balloon stent graft extending past left subclavian with graft to graft in ascending aorta bifucated to innominate and carotid and graft to graft left subclavian. 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    Echo  11/29/19 Normal EF trivial AR  Cardiac CTA 06/09/17 with no significant CAD stable repair   Had right popliteal aneurysm Last duplex  8.17.22 1.94 cm   Daughter in [place] has liver failure from ETOH   Has been maintained on coumadin for PAF and left axillary vein thrombosis   Review of INR;s have been therapeutic for a year   Doing well no bleeding issues Compliant with meds No dizziness   Past Medical History:  Diagnosis Date   ADJ DISORDER WITH MIXED ANXIETY & DEPRESSED MOOD 09/08/2009   Qualifier: Diagnosis of  By: Don Hashimoto MD, Don Medina     Arthritis    Atrial fibrillation (Don Medina) [I48.91] 05/23/2017   Blood in the stool    BRADYCARDIA 03/10/2010   Qualifier: Diagnosis of  By: Don Hashimoto MD, Don Medina     CHEST PAIN 03/19/2010   Qualifier: Diagnosis of  By: Don Found, LPN, Don Medina     Chicken pox    COLONIC POLYPS, HX OF 01/13/2009   Qualifier: Diagnosis  of  By: Don Cava LPN, Don Medina     Episode of generalized weakness 02/17/2016   Essential hypertension 03/15/2022   GERD (gastroesophageal reflux disease) 10/03/2012   Hay fever    History of colonic polyps    History of colonoscopy    Hyperlipidemia    MUSCLE STRAIN, HAMSTRING MUSCLE 09/08/2009   Qualifier: Diagnosis of  By: Don Hashimoto MD, Earl Many DIZZINESS 03/05/2010   Qualifier: Diagnosis of  By: Don Hashimoto MD, Don Medina     Other premature beats 03/17/2010   Qualifier: Diagnosis of  By: Don Le, MD, Don Medina    Premature ventricular contraction 01/05/2011   S/P aortic dissection repair 05/08/2017   THROMBOCYTOPENIA 03/05/2010   Qualifier: Diagnosis of  By: Don Hashimoto MD, Don Medina     Weakness 12/09/2015    Past Surgical History:  Procedure Laterality Date   ABDOMINAL ADHESION SURGERY  2007   AORTIC INTERVENTION N/A 05/08/2017   Procedure: HYPOTHERMIC CIRCULATORY ARREST;  Surgeon: Don Isaac, MD;  Location: [place];  Service: Open Heart Surgery;  Laterality: N/A;   ASCENDING AORTIC ROOT REPLACEMENT N/A 05/08/2017   Procedure: REPLACEMENT OF ASCENDING AORTIC ARCH;  Surgeon: Don Isaac, MD;  Location: [place];  Service: Open Heart Surgery;  Laterality: N/A;   CANNULATION FOR CARDIOPULMONARY BYPASS Right 05/08/2017  Procedure: AXILLARY CANNULATION;  Surgeon: Don Isaac, MD;  Location: [place];  Service: Open Heart Surgery;  Laterality: Right;   ENDOVASCULAR REPAIR/STENT GRAFT  05/08/2017   Procedure: STENT OF LEFT SUBCLAVIAN ARTERY with 10x39VBX;  Surgeon: Don Isaac, MD;  Location: [place];  Service: Open Heart Surgery;;   herniated diks surgery L-5  1996   herniated disk surgery c 6-7  1986   INTRAOPERATIVE TRANSESOPHAGEAL ECHOCARDIOGRAM N/A 05/08/2017   Procedure: INTRAOPERATIVE TRANSESOPHAGEAL ECHOCARDIOGRAM;  Surgeon: Don Isaac, MD;  Location: [place];  Service: Open Heart Surgery;  Laterality: N/A;   REPLACEMENT ASCENDING AORTA N/A 05/08/2017    Procedure: REPLACEMENT ASCENDING AORTA;  Surgeon: Don Isaac, MD;  Location: [place];  Service: Open Heart Surgery;  Laterality: N/A;   THORACIC AORTIC ENDOVASCULAR STENT GRAFT  05/08/2017   Procedure: STENT GRAFTING OF DESCENDING THORACIC AORTA;  Surgeon: Don Isaac, MD;  [facility];  Service: Open Heart Surgery;;     Medications: Current Meds  Medication Sig   citalopram (CELEXA) 20 MG tablet TAKE 1 TABLET BY MOUTH EVERY DAY   folic acid (FOLVITE) 1 MG tablet Take 1 tablet by mouth daily.   losartan-hydrochlorothiazide (HYZAAR) 100-12.5 MG tablet Take 1 tablet by mouth daily.   metoprolol tartrate (LOPRESSOR) 25 MG tablet TAKE 1/2 TABLTE BY MOUTH TWICE A DAY   Misc Natural Products (GLUCOS-CHONDROIT-MSM COMPLEX) TABS Take 1 tablet by mouth 3 (three) times daily.   Omega-3 Fatty Acids (FISH OIL) 1000 MG CAPS Take 1 capsule by mouth daily.   simvastatin (ZOCOR) 20 MG tablet TAKE 1 TABLET BY MOUTH AT BEDTIME**NEED OFFICE VISIT**   warfarin (COUMADIN) 2.5 MG tablet TAKE 2 TABLETS BY MOUTH DAILY EXCEPT 3 TABS ON TUESDAYS & SATURDAYS OR AS DIRECTED BY ANTICOAGULATION CLINIC     Allergies: Allergies  Allergen Reactions   Heparin     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 Recent studies have raised concern that fluoroquinolone antibiotics could be associated with an increased risk of aortic aneurysm Fluoroquinolones have non-antimicrobial properties that might jeopardise the integrity of the extracellular matrix of the vascular wall In a  propensity score matched cohort study in [place], there was a 66% increased rate of aortic aneurysm or dissection associated with oral fluoroquinolone use, compared wit    Social History: The patient  reports that he quit smoking about 47 years ago. His smoking use included cigarettes. He has never used smokeless tobacco. He reports that he  does not drink alcohol and does not use drugs.   Family History: The patient's family history includes Alzheimer's disease (age of onset: 58) in his father; Cancer in his brother; Dementia in his father; Hypertension in an other family member; Stroke (age of onset: 69) in his mother; Sudden death in an other family member.   Review of Systems: Please see the history of present illness.   Otherwise, the review of systems is positive for none.   All other systems are reviewed and negative.   Physical Exam: VS:  BP 124/60   Pulse (!) 54   Ht '5\' 8"'$  (1.727 m)   Wt 177 lb (80.3 kg)   BMI 26.91 kg/m  .  BMI Body mass index is 26.91 kg/m.  Wt Readings from Last 3 Encounters:  06/04/22 177 lb (80.3 kg)  05/19/22 180 lb (81.6 kg)  03/30/22 177 lb 12.8 oz (80.6  kg)    Affect appropriate Healthy:  appears stated age 68: normal Neck supple with no adenopathy JVP normal no bruits no thyromegaly Lungs clear with no wheezing and good diaphragmatic motion Heart:  S1/S2 AR murmur, no rub, gallop or click PMI normal Medina sternotomy  Abdomen: benighn, BS positve, no tenderness, no AAA no bruit.  No HSM or HJR Distal pulses intact with no bruits No edema Neuro non-focal Skin warm and dry No muscular weakness    LABORATORY DATA:  EKG:   06/04/2022 SR rate 54 nonspecific ST changes   Lab Results  Component Value Date   WBC 7.3 10/12/2021   HGB 15.0 10/12/2021   HCT 43.4 10/12/2021   PLT 115.0 (L) 10/12/2021   GLUCOSE 109 (H) 03/30/2022   CHOL 141 10/12/2021   TRIG 99.0 10/12/2021   HDL 47.90 10/12/2021   LDLCALC 74 10/12/2021   ALT 16 10/12/2021   AST 26 10/12/2021   NA 137 03/30/2022   K 4.0 03/30/2022   CL 100 03/30/2022   CREATININE 1.15 03/30/2022   BUN 18 03/30/2022   CO2 28 03/30/2022   TSH 5.980 (H) 06/01/2017   PSA 1.25 10/12/2021   INR 2.2 04/26/2022   HGBA1C 5.9 05/15/2021     BNP (last 3 results) No results for input(s): "BNP" in the last 8760  hours.  ProBNP (last 3 results) No results for input(s): "PROBNP" in the last 8760 hours.   Other Studies Reviewed Today:  Vascular Medina Artery: 03/25/21 Summary:  Right: Patent popliteal artery with maximum diameter measuring 1.94 cm.   Left: Patent popliteal artery with maximum diameter measuring 1.30 cm.    TTE:  11/29/19    IMPRESSIONS     1. Normal GLS - 20.1 . Left ventricular ejection fraction, by estimation,  is 60 to 65%. The left ventricle has normal function. The left ventricle  has no regional wall motion abnormalities. Left ventricular diastolic  parameters were normal.   2. Right ventricular systolic function is normal. The right ventricular  size is normal.   3. Left atrial size was moderately dilated.   4. The mitral valve is normal in structure. No evidence of mitral valve  regurgitation. No evidence of mitral stenosis.   5. The aortic valve is tricuspid. Aortic valve regurgitation is trivial.  No aortic stenosis is present.   6. Medina type A dissection with repair no residual intimal defect noted  and normal aortic root size.   7. The inferior vena Medina is normal in size with greater than 50%  respiratory variability, suggesting right atrial pressure of 3 mmHg.   Comparison(s): 04/11/18 EF 55-60%.    Assessment/Plan:   1. Type A Aortic Dissection F/U CVTS. BP under good control   Review of TTE 11/29/19 shows trivial  AR normal EF and normal aortic root size with mild dilatation at the sinus level CTA 05/19/22 unchanged and stable He is now seeing Dr Cyndia Bent since Dr Servando Snare retired   2. Orthostatic hypotension/dizziness - improved with lower BP meds    3. Medina op AF - now off amiodarone maintaining NSR   4. +HIT noted PLT still down 115 10/12/21   5. Popliteal Aneurysm:  Duplex 03/25/21 right popliteal stable 1.94 cm will order f/u And refer back to [facility]  6. HTN:  improved with addition of Loartan / HCTZ by Dr Don Medina   Given PAF, popliteal aneurysm (  currently no mural thrombus to potentially embolize distally given anticoagulation ) And thrombosis of the  left subclavian vein would suggest that life long coumadin appropriate     Medina arterial duplex  F/U in a year   Jenkins Rouge

## 2022-06-04 ENCOUNTER — Encounter: Payer: Self-pay | Admitting: Cardiovascular Disease

## 2022-06-04 ENCOUNTER — Ambulatory Visit: Payer: Medicare Other | Attending: Cardiovascular Disease | Admitting: Cardiovascular Disease

## 2022-06-04 VITALS — BP 124/60 | HR 54 | Ht 68.0 in | Wt 177.0 lb

## 2022-06-04 DIAGNOSIS — Z8679 Personal history of other diseases of the circulatory system: Secondary | ICD-10-CM

## 2022-06-04 DIAGNOSIS — I724 Aneurysm of artery of lower extremity: Secondary | ICD-10-CM

## 2022-06-04 DIAGNOSIS — I951 Orthostatic hypotension: Secondary | ICD-10-CM | POA: Diagnosis not present

## 2022-06-04 NOTE — Patient Instructions (Addendum)
Medication Instructions:  Your physician recommends that you continue on your current medications as directed. Please refer to the Current Medication list given to you today.  *If you need a refill on your cardiac medications before your next appointment, please call your pharmacy*  Lab Work: If you have labs (blood work) drawn today and your tests are completely normal, you will receive your results only by: [place] (if you have MyChart) OR A paper copy in the mail If you have any lab test that is abnormal or we need to change your treatment, we will call you to review the results.  Testing/Procedures: Your physician has requested that you have a lower extremity arterial duplex. This test is an ultrasound of the arteries in the legs. It looks at arterial blood flow in the legs. Allow one hour for Lower Arterial scans. There are no restrictions or special instructions.  Follow-Up: At [facility], you and your health needs are our priority.  As part of our continuing mission to provide you with exceptional heart care, we have created designated Provider Care Teams.  These Care Teams include your primary Cardiologist (physician) and Advanced Practice Providers (APPs -  Physician Assistants and Nurse Practitioners) who all work together to provide you with the care you need, when you need it.  We recommend signing up for the patient portal called "MyChart".  Sign up information is provided on this After Visit Summary.  MyChart is used to connect with patients for Virtual Visits (Telemedicine).  Patients are able to view lab/test results, encounter notes, upcoming appointments, etc.  Non-urgent messages can be sent to your provider as well.   To learn more about what you can do with MyChart, go to [email or web address].    Your next appointment:   1 year(s)  The format for your next appointment:   In Person  Provider:   Jenkins Rouge, MD     Important Information About  [place]

## 2022-06-07 ENCOUNTER — Ambulatory Visit: Payer: Medicare Other

## 2022-06-07 DIAGNOSIS — Z7901 Long term (current) use of anticoagulants: Secondary | ICD-10-CM

## 2022-06-07 LAB — POCT INR: INR: 2.1 (ref 2.0–3.0)

## 2022-06-07 NOTE — Progress Notes (Signed)
Continue taking '5mg'$  daily except 7.'5mg'$  on Tuesdays and Saturdays. Recheck in 6 weeks.

## 2022-06-07 NOTE — Patient Instructions (Signed)
Continue taking '5mg'$  daily except 7.'5mg'$  on Tuesdays and Saturdays. Recheck in 6 weeks.

## 2022-06-13 ENCOUNTER — Other Ambulatory Visit: Payer: Self-pay | Admitting: Family Medicine

## 2022-06-14 ENCOUNTER — Ambulatory Visit: Payer: Medicare Other | Admitting: Cardiovascular Disease

## 2022-06-16 ENCOUNTER — Other Ambulatory Visit: Payer: Self-pay | Admitting: Cardiovascular Disease

## 2022-06-28 ENCOUNTER — Other Ambulatory Visit: Payer: Self-pay | Admitting: Family Medicine

## 2022-06-28 ENCOUNTER — Encounter: Payer: Self-pay | Admitting: Family Medicine

## 2022-06-29 MED ORDER — LOSARTAN POTASSIUM-HCTZ 100-12.5 MG PO TABS: 1.0000 | ORAL_TABLET | Freq: Every day | ORAL | 3 refills | Status: DC

## 2022-07-19 ENCOUNTER — Ambulatory Visit (INDEPENDENT_AMBULATORY_CARE_PROVIDER_SITE_OTHER): Payer: Medicare Other

## 2022-07-19 DIAGNOSIS — I480 Paroxysmal atrial fibrillation: Secondary | ICD-10-CM

## 2022-07-19 DIAGNOSIS — Z7901 Long term (current) use of anticoagulants: Secondary | ICD-10-CM

## 2022-07-19 LAB — POCT INR: INR: 2.4 (ref 2.0–3.0)

## 2022-07-19 MED ORDER — WARFARIN SODIUM 2.5 MG PO TABS: ORAL_TABLET | ORAL | 1 refills | Status: DC

## 2022-07-19 NOTE — Patient Instructions (Addendum)
Pre visit review using our clinic review tool, if applicable. No additional management support is needed unless otherwise documented below in the visit note.  Continue taking '5mg'$  daily except 7.'5mg'$  on Tuesdays and Saturdays. Recheck in 6 weeks.

## 2022-07-19 NOTE — Progress Notes (Unsigned)
Continue taking '5mg'$  daily except 7.'5mg'$  on Tuesdays and Saturdays. Recheck in 6 weeks. Pt reported he needs a refill of warfarin. Sent to preferred pharmacy.

## 2022-08-22 ENCOUNTER — Encounter: Payer: Self-pay | Admitting: Cardiovascular Disease

## 2022-08-22 ENCOUNTER — Encounter: Payer: Self-pay | Admitting: Family Medicine

## 2022-08-30 ENCOUNTER — Ambulatory Visit: Payer: Medicare Other

## 2022-08-30 DIAGNOSIS — Z7901 Long term (current) use of anticoagulants: Secondary | ICD-10-CM

## 2022-08-30 DIAGNOSIS — I480 Paroxysmal atrial fibrillation: Secondary | ICD-10-CM

## 2022-08-30 LAB — POCT INR: INR: 2.8 (ref 2.0–3.0)

## 2022-08-30 MED ORDER — WARFARIN SODIUM 2.5 MG PO TABS: ORAL_TABLET | ORAL | 1 refills | Status: DC

## 2022-08-30 NOTE — Patient Instructions (Addendum)
Pre visit review using our clinic review tool, if applicable. No additional management support is needed unless otherwise documented below in the visit note.  Continue taking '5mg'$  daily except 7.'5mg'$  on Tuesdays and Saturdays. Recheck in 6 weeks.

## 2022-08-30 NOTE — Progress Notes (Addendum)
Continue taking '5mg'$  daily except 7.'5mg'$  on Tuesdays and Saturdays. Recheck in 6 weeks.  Pt requested refill of warfarin. Pt is compliant with warfarin management and PCP apts. Sent in refill.

## 2022-09-10 ENCOUNTER — Other Ambulatory Visit: Payer: Self-pay | Admitting: Family Medicine

## 2022-09-13 ENCOUNTER — Other Ambulatory Visit: Payer: Self-pay | Admitting: *Deleted

## 2022-09-13 DIAGNOSIS — I724 Aneurysm of artery of lower extremity: Secondary | ICD-10-CM

## 2022-09-16 ENCOUNTER — Encounter (HOSPITAL_COMMUNITY): Payer: Medicare Other

## 2022-09-16 ENCOUNTER — Ambulatory Visit: Payer: Medicare Other | Admitting: Vascular Surgery

## 2022-09-21 ENCOUNTER — Encounter: Payer: Self-pay | Admitting: Family Medicine

## 2022-09-22 ENCOUNTER — Ambulatory Visit: Payer: Medicare Other | Admitting: Family Medicine

## 2022-09-22 VITALS — BP 106/60 | HR 59 | Ht 68.0 in | Wt 181.7 lb

## 2022-09-22 DIAGNOSIS — I10 Essential (primary) hypertension: Secondary | ICD-10-CM | POA: Diagnosis not present

## 2022-09-22 DIAGNOSIS — B351 Tinea unguium: Secondary | ICD-10-CM | POA: Diagnosis not present

## 2022-09-22 DIAGNOSIS — N401 Enlarged prostate with lower urinary tract symptoms: Secondary | ICD-10-CM

## 2022-09-22 NOTE — Progress Notes (Signed)
Established Patient Office Visit  Subjective   Patient ID: Don Medina, male    DOB: 1940/11/29  Age: 82 y.o. MRN: [id number]  Chief Complaint  Patient presents with   Nail Problem    HPI   Don Medina has history of hypertension, atrial fibrillation, PVCs, hyperlipidemia, status post aortic dissection repair who presents for the following concerns  He has noticed some dystrophic changes of the left great toenail.  Discoloration and some abnormal thickening.  No pain.  No drainage.  He had read online at a [facility] site about medications including [place].  We explained that both of those are topicals that take a year for treatment and have fairly low cure rates.  At this point he is not sure he wishes to pursue treatment  Second issue is concern for BPH issues.  He has some nocturia but varies considerably.  Sometimes once and sometimes up to 4 times per night.  Some slow stream but this also varies considerably.  Does not take any anticholinergics.  No recent burning with urination.  Engages in regular exercise.  Goes to the [facility] several days per week.  He has hypertension treated with losartan HCTZ and also takes metoprolol.  Occasional lightheadedness with standing.  Past Medical History:  Diagnosis Date   ADJ DISORDER WITH MIXED ANXIETY & DEPRESSED MOOD 09/08/2009   Qualifier: Diagnosis of  By: Elease Hashimoto MD, Bodee Lafoe     Arthritis    Atrial fibrillation ([place]) [I48.91] 05/23/2017   Blood in the stool    BRADYCARDIA 03/10/2010   Qualifier: Diagnosis of  By: Elease Hashimoto MD, Alura Olveda     CHEST PAIN 03/19/2010   Qualifier: Diagnosis of  By: [place], LPN, Christine     Chicken pox    COLONIC POLYPS, HX OF 01/13/2009   Qualifier: Diagnosis of  By: Valma Cava LPN, Nancy     Episode of generalized weakness 02/17/2016   Essential hypertension 03/15/2022   GERD (gastroesophageal reflux disease) 10/03/2012   Hay fever    History of colonic polyps    History of colonoscopy     Hyperlipidemia    MUSCLE STRAIN, HAMSTRING MUSCLE 09/08/2009   Qualifier: Diagnosis of  By: Elease Hashimoto MD, Earl Many DIZZINESS 03/05/2010   Qualifier: Diagnosis of  By: Elease Hashimoto MD, Gentry Seeber     Other premature beats 03/17/2010   Qualifier: Diagnosis of  By: Lovena Le, MD, Martyn Malay    Premature ventricular contraction 01/05/2011   S/P aortic dissection repair 05/08/2017   THROMBOCYTOPENIA 03/05/2010   Qualifier: Diagnosis of  By: Elease Hashimoto MD, Devann Cribb     Weakness 12/09/2015   Past Surgical History:  Procedure Laterality Date   ABDOMINAL ADHESION SURGERY  2007   AORTIC INTERVENTION N/A 05/08/2017   Procedure: HYPOTHERMIC CIRCULATORY ARREST;  Surgeon: [name], MD;  Location: [place];  Service: Open Heart Surgery;  Laterality: N/A;   ASCENDING AORTIC ROOT REPLACEMENT N/A 05/08/2017   Procedure: REPLACEMENT OF ASCENDING AORTIC ARCH;  Surgeon: [name], MD;  Location: [place];  Service: Open Heart Surgery;  Laterality: N/A;   CANNULATION FOR CARDIOPULMONARY BYPASS Right 05/08/2017   Procedure: AXILLARY CANNULATION;  Surgeon: [name], MD;  Location: [place];  Service: Open Heart Surgery;  Laterality: Right;   ENDOVASCULAR REPAIR/STENT GRAFT  05/08/2017   Procedure: STENT OF LEFT SUBCLAVIAN ARTERY with 10x39VBX;  Surgeon: [name], MD;  Location: [place];  Service: Open Heart Surgery;;   herniated diks surgery L-5  1996   herniated disk surgery c 6-7  1986   INTRAOPERATIVE TRANSESOPHAGEAL ECHOCARDIOGRAM N/A 05/08/2017   Procedure: INTRAOPERATIVE TRANSESOPHAGEAL ECHOCARDIOGRAM;  Surgeon: [name], MD;  Location: [place];  Service: Open Heart Surgery;  Laterality: N/A;   REPLACEMENT ASCENDING AORTA N/A 05/08/2017   Procedure: REPLACEMENT ASCENDING AORTA;  Surgeon: [name], MD;  Location: [place];  Service: Open Heart Surgery;  Laterality: N/A;   THORACIC AORTIC ENDOVASCULAR STENT GRAFT  05/08/2017   Procedure: STENT GRAFTING OF DESCENDING  THORACIC AORTA;  Surgeon: [name], MD;  Location: [place];  Service: Open Heart Surgery;;    reports that he quit smoking about 48 years ago. His smoking use included cigarettes. He has never used smokeless tobacco. He reports that he does not drink alcohol and does not use drugs. family history includes Alzheimer's disease (age of onset: 93) in his father; Cancer in his brother; Dementia in his father; Hypertension in an other family member; Stroke (age of onset: 32) in his mother; Sudden death in an other family member. 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 Recent studies have raised concern that fluoroquinolone antibiotics could be associated with an increased risk of aortic aneurysm Fluoroquinolones have non-antimicrobial properties that might jeopardise the integrity of the extracellular matrix of the vascular wall In a  propensity score matched cohort study in [place], there was a 66% increased rate of aortic aneurysm or dissection associated with oral fluoroquinolone use, compared wit    Review of Systems  Constitutional:  Negative for malaise/fatigue.  Eyes:  Negative for blurred vision.  Respiratory:  Negative for shortness of breath.   Cardiovascular:  Negative for chest pain.  Genitourinary:  Negative for hematuria.  Neurological:  Negative for dizziness, weakness and headaches.      Objective:     BP 106/60 (BP Location: Left Arm, Patient Position: Sitting, Cuff Size: Normal)   Pulse (!) 59   Ht 5' 8"$  (1.727 m)   Wt 181 lb 11.2 oz (82.4 kg)   SpO2 99%   BMI 27.63 kg/m    Physical Exam Vitals reviewed.  Constitutional:      Appearance: Normal appearance.  Cardiovascular:     Rate and Rhythm: Normal rate.     Comments: He has soft grade 2 systolic murmur right upper sternal border Pulmonary:     Effort: Pulmonary  effort is normal.     Breath sounds: Normal breath sounds.  Skin:    Comments: Great toenail reveals some abnormal discoloration along the medial border and also has some abnormal thickening and onycholysis changes especially along the medial border  Neurological:     Mental Status: He is alert.      No results found for any visits on 09/22/22.    The ASCVD Risk score (Arnett DK, et al., 2019) failed to calculate for the following reasons:   The 2019 ASCVD risk score is only valid for ages 21 to 35    Assessment & Plan:   #1 probable onychomycosis left great toenail.  Also has some onycholysis.  We discussed pros and cons of therapy.  We explained that topical therapies are generally not as effective as oral therapy such as Lamisil.  Some topical therapies are also very costly.  At this point, he has decided he does not wish to pursue  further treatment but if he does would consider 97-monthcourse of Lamisil but would need to clear to make sure liver function stable first  #2 probable BPH.  Discussed importance of avoidance of anticholinergics.  We discussed options including peripheral alpha-blocker's such as Flomax and alpha reductase inhibitors such as finasteride At this point he feels like symptoms are not severe enough to warrant treatment with medication If starting Flomax might need to consider scaling back his losartan to avoid hypotension  #3 hypertension well-controlled.  Continue losartan HCTZ and metoprolol.  Watch for any orthostatic symptoms.    BCarolann Littler MD

## 2022-09-23 ENCOUNTER — Ambulatory Visit (INDEPENDENT_AMBULATORY_CARE_PROVIDER_SITE_OTHER)
Admission: RE | Admit: 2022-09-23 | Discharge: 2022-09-23 | Disposition: A | Discharge status: Home / Self Care | Payer: Medicare Other | Source: Ambulatory Visit | Attending: Vascular Surgery | Admitting: Vascular Surgery

## 2022-09-23 ENCOUNTER — Encounter: Payer: Self-pay | Admitting: Vascular Surgery

## 2022-09-23 ENCOUNTER — Ambulatory Visit: Payer: Medicare Other | Admitting: Vascular Surgery

## 2022-09-23 ENCOUNTER — Ambulatory Visit (HOSPITAL_COMMUNITY)
Admission: RE | Admit: 2022-09-23 | Discharge: 2022-09-23 | Disposition: A | Discharge status: Home / Self Care | Payer: Medicare Other | Source: Ambulatory Visit | Attending: Vascular Surgery | Admitting: Vascular Surgery

## 2022-09-23 VITALS — BP 133/71 | HR 58 | Temp 97.9°F | Resp 20 | Ht 68.0 in | Wt 179.0 lb

## 2022-09-23 DIAGNOSIS — I724 Aneurysm of artery of lower extremity: Secondary | ICD-10-CM

## 2022-09-23 LAB — VAS US ABI WITH/WO TBI

## 2022-09-23 NOTE — Progress Notes (Signed)
REASON FOR VISIT:   Follow-up of right popliteal artery aneurysm.  MEDICAL ISSUES:   RIGHT POPLITEAL ARTERY ANEURYSM: His right popliteal artery aneurysm has not changed significantly in size.  The maximum diameter is 2.06 cm.  He is not a smoker.  His blood pressure is under good control.  I think we can continue his follow-up at 18 months.  I have ordered a follow-up duplex in 18 months and he will be seen on the PA schedule at that time.  I explained that I will be retiring.   HPI:   Don Medina is a pleasant 82 y.o. male who I originally saw back in July 2019.  He had undergone an MRI and an incidental finding was a 1.6 cm right popliteal artery aneurysm.  Have been following this for some time now and this has been relatively stable in size.  He comes in for an 50-monthfollow-up visit.  Since I saw him last, he denies any claudication or rest pain.  He is not a smoker.  He remains very active and goes to the gym frequently.  Past Medical History:  Diagnosis Date   ADJ DISORDER WITH MIXED ANXIETY & DEPRESSED MOOD 09/08/2009   Qualifier: Diagnosis of  By: BElease HashimotoMD, Bruce     Arthritis    Atrial fibrillation ([place] [I48.91] 05/23/2017   Blood in the stool    BRADYCARDIA 03/10/2010   Qualifier: Diagnosis of  By: BElease HashimotoMD, Bruce     CHEST PAIN 03/19/2010   Qualifier: Diagnosis of  By: YAllean Found LPN, Christine     Chicken pox    COLONIC POLYPS, HX OF 01/13/2009   Qualifier: Diagnosis of  By: NValma CavaLPN, Nancy     Episode of generalized weakness 02/17/2016   Essential hypertension 03/15/2022   GERD (gastroesophageal reflux disease) 10/03/2012   Hay fever    History of colonic polyps    History of colonoscopy    Hyperlipidemia    MUSCLE STRAIN, HAMSTRING MUSCLE 09/08/2009   Qualifier: Diagnosis of  By: BElease HashimotoMD, BEarl ManyDIZZINESS 03/05/2010   Qualifier: Diagnosis of  By: BElease HashimotoMD, Bruce     Other premature beats 03/17/2010   Qualifier: Diagnosis of   By: TLovena Le MD, FMartyn Malay   Premature ventricular contraction 01/05/2011   S/P aortic dissection repair 05/08/2017   THROMBOCYTOPENIA 03/05/2010   Qualifier: Diagnosis of  By: BElease HashimotoMD, Bruce     Weakness 12/09/2015    Family History  Problem Relation Age of Onset   Alzheimer's disease Father 868  Dementia Father    Stroke Mother 898  Cancer Brother        Leukemia   Sudden death Other        family   Hypertension Other    Heart disease Neg Hx     SOCIAL HISTORY: Social History   Tobacco Use   Smoking status: Former    Types: Cigarettes    Quit date: 08/09/1974    Years since quitting: 48.1   Smokeless tobacco: Never  Substance Use Topics   Alcohol use: No    Allergies  Allergen Reactions   Heparin     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 Recent studies have raised concern that fluoroquinolone antibiotics could be associated with an increased risk of aortic aneurysm Fluoroquinolones have non-antimicrobial properties that might  jeopardise the integrity of the extracellular matrix of the vascular wall In a  propensity score matched cohort study in [place], there was a 66% increased rate of aortic aneurysm or dissection associated with oral fluoroquinolone use, compared wit    Current Outpatient Medications  Medication Sig Dispense Refill   citalopram (CELEXA) 20 MG tablet TAKE 1 TABLET BY MOUTH EVERY DAY 90 tablet 0   folic acid (FOLVITE) 1 MG tablet Take 1 tablet by mouth daily.     losartan-hydrochlorothiazide (HYZAAR) 100-12.5 MG tablet Take 1 tablet by mouth daily. 90 tablet 3   metoprolol tartrate (LOPRESSOR) 25 MG tablet TAKE 1/2 TABLET BY MOUTH TWICE A DAY 90 tablet 3   Misc Natural Products (GLUCOS-CHONDROIT-MSM COMPLEX) TABS Take 1 tablet by mouth 3 (three) times daily.     Omega-3 Fatty Acids (FISH OIL) 1000 MG CAPS Take 1 capsule by mouth daily.      simvastatin (ZOCOR) 20 MG tablet TAKE 1 TABLET BY MOUTH AT BEDTIME**NEED OFFICE VISIT** 90 tablet 0   warfarin (COUMADIN) 2.5 MG tablet TAKE 2 TABLETS BY MOUTH DAILY EXCEPT 3 TABS ON TUESDAYS & SATURDAYS OR AS DIRECTED BY ANTICOAGULATION CLINIC 220 tablet 1   No current facility-administered medications for this visit.    REVIEW OF SYSTEMS:  [X]$  denotes positive finding, [ ]$  denotes negative finding Cardiac  Comments:  Chest pain or chest pressure:    Shortness of breath upon exertion:    Short of breath when lying flat:    Irregular heart rhythm:        Vascular    Pain in calf, thigh, or hip brought on by ambulation:    Pain in feet at night that wakes you up from your sleep:     Blood clot in your veins:    Leg swelling:         Pulmonary    Oxygen at home:    Productive cough:     Wheezing:         Neurologic    Sudden weakness in arms or legs:     Sudden numbness in arms or legs:     Sudden onset of difficulty speaking or slurred speech:    Temporary loss of vision in one eye:     Problems with dizziness:         Gastrointestinal    Blood in stool:     Vomited blood:         Genitourinary    Burning when urinating:     Blood in urine:        Psychiatric    Major depression:         Hematologic    Bleeding problems:    Problems with blood clotting too easily:        Skin    Rashes or ulcers:        Constitutional    Fever or chills:     PHYSICAL EXAM:   Vitals:   09/23/22 1250  BP: 133/71  Pulse: (!) 58  Resp: 20  Temp: 97.9 F (36.6 C)  SpO2: 95%  Weight: 179 lb (81.2 kg)  Height: 5' 8"$  (1.727 m)    GENERAL: The patient is a well-nourished male, in no acute distress. The vital signs are documented above. CARDIAC: There is a regular rate and rhythm.  VASCULAR: I do not detect carotid bruits. On the right side he has a palpable femoral, popliteal, dorsalis pedis, and posterior tibial pulse. On the left side  he has a palpable femoral,  popliteal, and posterior tibial pulse. He has no evidence of atheroembolic disease. PULMONARY: There is good air exchange bilaterally without wheezing or rales. ABDOMEN: Soft and non-tender with normal pitched bowel sounds.  I do not palpate an aneurysm. MUSCULOSKELETAL: There are no major deformities or cyanosis. NEUROLOGIC: No focal weakness or paresthesias are detected. SKIN: There are no ulcers or rashes noted. PSYCHIATRIC: The patient has a normal affect.  DATA:    ARTERIAL DUPLEX: I have independently interpreted the patient's arterial duplex scan today.  The maximum diameter of the right popliteal artery is 2.06 cm.  The maximum diameter of the left popliteal artery is 1.39 cm.  Thus neither has changed significantly in size over the last 18 months.  ARTERIAL DOPPLER STUDY: I have independently interpreted his arterial Doppler study today.  On the right side there is a biphasic posterior tibial signal with a triphasic dorsalis pedis signal.  ABIs greater than 100%.  Toe pressures 129 mmHg.  On the left side there is a triphasic posterior tibial signal with a biphasic dorsalis pedis signal.  ABIs greater than 100%.  Toe pressures 122 mmHg.   Deitra Mayo [facility] [phone]

## 2022-10-11 ENCOUNTER — Ambulatory Visit: Payer: Medicare Other

## 2022-10-11 DIAGNOSIS — Z7901 Long term (current) use of anticoagulants: Secondary | ICD-10-CM | POA: Diagnosis not present

## 2022-10-11 LAB — POCT INR: INR: 1.8 — AB (ref 2.0–3.0)

## 2022-10-11 NOTE — Progress Notes (Signed)
Increase dose today to take 7.5 mg and then continue taking '5mg'$  daily except 7.'5mg'$  on Tuesdays and Saturdays. Recheck in 4 weeks.

## 2022-10-11 NOTE — Patient Instructions (Addendum)
Pre visit review using our clinic review tool, if applicable. No additional management support is needed unless otherwise documented below in the visit note.  Increase dose today to take 7.5 mg and then continue taking '5mg'$  daily except 7.'5mg'$  on Tuesdays and Saturdays. Recheck in 4 weeks.

## 2022-11-03 ENCOUNTER — Encounter: Payer: Self-pay | Admitting: Family Medicine

## 2022-11-03 NOTE — Telephone Encounter (Signed)
Noted  

## 2022-11-10 ENCOUNTER — Ambulatory Visit: Payer: Medicare Other

## 2022-11-15 ENCOUNTER — Ambulatory Visit: Payer: Medicare Other

## 2022-11-15 DIAGNOSIS — Z7901 Long term (current) use of anticoagulants: Secondary | ICD-10-CM

## 2022-11-15 LAB — POCT INR: INR: 2.1 (ref 2.0–3.0)

## 2022-11-15 NOTE — Progress Notes (Signed)
Continue taking 5mg daily except 7.5mg on Tuesdays and Saturdays. Recheck in 6 weeks. 

## 2022-11-15 NOTE — Patient Instructions (Addendum)
Pre visit review using our clinic review tool, if applicable. No additional management support is needed unless otherwise documented below in the visit note.  Continue taking 5mg daily except 7.5mg on Tuesdays and Saturdays. Recheck in 6 weeks.    

## 2022-11-28 ENCOUNTER — Other Ambulatory Visit: Payer: Self-pay | Admitting: Family Medicine

## 2022-12-25 ENCOUNTER — Other Ambulatory Visit: Payer: Self-pay | Admitting: Family Medicine

## 2022-12-27 ENCOUNTER — Ambulatory Visit: Payer: Medicare Other

## 2022-12-27 DIAGNOSIS — Z7901 Long term (current) use of anticoagulants: Secondary | ICD-10-CM | POA: Diagnosis not present

## 2022-12-27 LAB — POCT INR: INR: 2.9 (ref 2.0–3.0)

## 2022-12-27 NOTE — Progress Notes (Signed)
Continue taking 5mg daily except 7.5mg on Tuesdays and Saturdays. Recheck in 6 weeks. 

## 2022-12-27 NOTE — Patient Instructions (Addendum)
Pre visit review using our clinic review tool, if applicable. No additional management support is needed unless otherwise documented below in the visit note.  Continue taking 5mg daily except 7.5mg on Tuesdays and Saturdays. Recheck in 6 weeks.    

## 2023-01-10 ENCOUNTER — Other Ambulatory Visit: Payer: Self-pay | Admitting: Family Medicine

## 2023-02-07 ENCOUNTER — Ambulatory Visit: Payer: Medicare Other

## 2023-02-07 DIAGNOSIS — Z7901 Long term (current) use of anticoagulants: Secondary | ICD-10-CM

## 2023-02-07 LAB — POCT INR: INR: 2.4 (ref 2.0–3.0)

## 2023-02-07 NOTE — Progress Notes (Signed)
Continue taking 5mg daily except 7.5mg on Tuesdays and Saturdays. Recheck in 6 weeks. 

## 2023-02-07 NOTE — Patient Instructions (Addendum)
Pre visit review using our clinic review tool, if applicable. No additional management support is needed unless otherwise documented below in the visit note.  Continue taking 5mg daily except 7.5mg on Tuesdays and Saturdays. Recheck in 6 weeks.    

## 2023-02-20 ENCOUNTER — Encounter: Payer: Self-pay | Admitting: Family Medicine

## 2023-02-21 NOTE — Telephone Encounter (Signed)
Most Medicare plans will cover a home INR machine. It will need to come from a physicians order to receive a machine. An order can be sent to a company that can provider a machine and supplies. They will process the order and contact the pt's insurance concerning coverage and if there is any cost to the pt. They will then contact the pt and educate them on their cost. Warfarin management will still be provided by the coumadin clinic, but done by phone instead of in the office.  Sent msg to let him know of above and inquired if he would like to proceed with an order for a home INR machine.

## 2023-02-23 NOTE — Telephone Encounter (Signed)
Order completed and signed for home INR machine. Faxed to two different numbers for [employer], the company that provides the machine and testing supplies.  Sent msg to pt to be aware he would be contacted by [employer].

## 2023-02-27 ENCOUNTER — Other Ambulatory Visit: Payer: Self-pay | Admitting: Family Medicine

## 2023-03-17 NOTE — Telephone Encounter (Signed)
Noted.  Stanly Si W Derika Eckles MD [facility] [place]  

## 2023-03-17 NOTE — Telephone Encounter (Signed)
Contacted pt who reports he is not going to get a home INR machine because it will cost more to do that than continue to come into the clinic for INR check. Pt has apt on 8/12. He reports he will make that apt.

## 2023-03-20 ENCOUNTER — Other Ambulatory Visit: Payer: Self-pay | Admitting: Cardiovascular Disease

## 2023-03-20 ENCOUNTER — Other Ambulatory Visit: Payer: Self-pay | Admitting: Family Medicine

## 2023-03-21 ENCOUNTER — Ambulatory Visit: Payer: Medicare Other

## 2023-03-21 DIAGNOSIS — Z7901 Long term (current) use of anticoagulants: Secondary | ICD-10-CM | POA: Diagnosis not present

## 2023-03-21 LAB — POCT INR: INR: 2.8 (ref 2.0–3.0)

## 2023-03-21 NOTE — Patient Instructions (Addendum)
Pre visit review using our clinic review tool, if applicable. No additional management support is needed unless otherwise documented below in the visit note.  Continue taking 5mg daily except 7.5mg on Tuesdays and Saturdays. Recheck in 6 weeks. 

## 2023-03-21 NOTE — Progress Notes (Signed)
Continue taking 5mg daily except 7.5mg on Tuesdays and Saturdays. Recheck in 6 weeks. 

## 2023-03-24 ENCOUNTER — Encounter (INDEPENDENT_AMBULATORY_CARE_PROVIDER_SITE_OTHER): Payer: Self-pay

## 2023-03-27 ENCOUNTER — Other Ambulatory Visit: Payer: Self-pay | Admitting: Family Medicine

## 2023-04-06 ENCOUNTER — Encounter: Payer: Self-pay | Admitting: Family Medicine

## 2023-04-06 MED ORDER — LOSARTAN POTASSIUM-HCTZ 50-12.5 MG PO TABS: 1.0000 | ORAL_TABLET | Freq: Every day | ORAL | 0 refills | Status: DC

## 2023-04-07 ENCOUNTER — Other Ambulatory Visit: Payer: Self-pay | Admitting: Family Medicine

## 2023-04-18 ENCOUNTER — Other Ambulatory Visit: Payer: Self-pay | Admitting: Surgery

## 2023-04-18 DIAGNOSIS — Z8679 Personal history of other diseases of the circulatory system: Secondary | ICD-10-CM

## 2023-04-25 ENCOUNTER — Other Ambulatory Visit: Payer: Self-pay | Admitting: Family Medicine

## 2023-05-02 ENCOUNTER — Ambulatory Visit: Payer: Medicare Other

## 2023-05-03 NOTE — Telephone Encounter (Signed)
Pt has been placed on the the coumadin clinic schedule for 10/2 at 3 pm at [facility]

## 2023-05-09 ENCOUNTER — Ambulatory Visit: Payer: Medicare Other

## 2023-05-09 LAB — LAB REPORT - SCANNED
A1c: 6.2
EGFR: 61

## 2023-05-11 ENCOUNTER — Ambulatory Visit: Payer: Medicare Other

## 2023-05-11 ENCOUNTER — Encounter: Payer: Self-pay | Admitting: Family Medicine

## 2023-05-11 DIAGNOSIS — Z7901 Long term (current) use of anticoagulants: Secondary | ICD-10-CM

## 2023-05-11 LAB — POCT INR: INR: 3.5 — AB (ref 2.0–3.0)

## 2023-05-11 MED ORDER — LOSARTAN POTASSIUM 25 MG PO TABS: 25.0000 mg | ORAL_TABLET | Freq: Every day | ORAL | 0 refills | Status: DC

## 2023-05-11 MED ORDER — HYDROCHLOROTHIAZIDE 12.5 MG PO CAPS: 12.5000 mg | ORAL_CAPSULE | Freq: Every day | ORAL | 0 refills | Status: DC

## 2023-05-11 NOTE — Progress Notes (Addendum)
Pt already took warfarin today.  Pt denies any changes. Pt denies any s/s of bleeding or abnormal bruising. Advised if any s/s go to the ER. Pt verbalized understanding.  Hold dose tomorrow and then change weekly dose to take 2 tablets daily except take 3 tablets on Tuesday.  Recheck in 3 weeks.

## 2023-05-11 NOTE — Telephone Encounter (Signed)
Noted  

## 2023-05-11 NOTE — Patient Instructions (Addendum)
Pre visit review using our clinic review tool, if applicable. No additional management support is needed unless otherwise documented below in the visit note.  Hold dose tomorrow and then change weekly dose to take 2 tablets daily except take 3 tablets on Tuesday.  Recheck in 3 weeks.

## 2023-05-11 NOTE — Telephone Encounter (Signed)
This is too low for the patient. He will need to reduce to losartan 25 mg and hydrochlorothiazide 12.5 mg daily-- the combo pill cannot be cut in half so he will need a new prescription sent to the pharmacy-- if you prepare it I will sign for him-- then he will need to see Dr. Caryl Never in the office for follow up

## 2023-05-16 ENCOUNTER — Other Ambulatory Visit: Payer: Self-pay | Admitting: Family Medicine

## 2023-05-30 ENCOUNTER — Inpatient Hospital Stay
Admission: RE | Admit: 2023-05-30 | Discharge: 2023-05-30 | Disposition: A | Discharge status: Home / Self Care | Payer: Medicare Other | Source: Ambulatory Visit | Attending: Surgery | Admitting: Surgery

## 2023-05-30 ENCOUNTER — Ambulatory Visit
Admission: RE | Admit: 2023-05-30 | Discharge: 2023-05-30 | Disposition: A | Discharge status: Home / Self Care | Payer: Medicare Other | Source: Ambulatory Visit | Attending: Surgery | Admitting: Surgery

## 2023-05-30 ENCOUNTER — Encounter: Payer: Self-pay | Admitting: Cardiovascular Disease

## 2023-05-30 DIAGNOSIS — Z8679 Personal history of other diseases of the circulatory system: Secondary | ICD-10-CM

## 2023-05-30 MED ORDER — IOPAMIDOL (ISOVUE-370) INJECTION 76%
500.0000 mL | Freq: Once | INTRAVENOUS | Status: AC | PRN
  Administered 2023-05-30: 100 mL via INTRAVENOUS

## 2023-06-01 ENCOUNTER — Ambulatory Visit: Payer: Medicare Other | Admitting: Surgery

## 2023-06-01 ENCOUNTER — Encounter: Payer: Self-pay | Admitting: Surgery

## 2023-06-01 ENCOUNTER — Ambulatory Visit: Payer: Medicare Other

## 2023-06-01 VITALS — BP 131/61 | HR 60 | Resp 18 | Wt 178.0 lb

## 2023-06-01 DIAGNOSIS — Z7901 Long term (current) use of anticoagulants: Secondary | ICD-10-CM

## 2023-06-01 DIAGNOSIS — Z8679 Personal history of other diseases of the circulatory system: Secondary | ICD-10-CM | POA: Diagnosis not present

## 2023-06-01 LAB — POCT INR: INR: 2 (ref 2.0–3.0)

## 2023-06-01 NOTE — Progress Notes (Signed)
Continue 2 tablets daily except take 3 tablets on Tuesday.  Recheck in 2 weeks.

## 2023-06-01 NOTE — Patient Instructions (Addendum)
Pre visit review using our clinic review tool, if applicable. No additional management support is needed unless otherwise documented below in the visit note.  Continue 2 tablets daily except take 3 tablets on Tuesday.  Recheck in 2 weeks.

## 2023-06-01 NOTE — Progress Notes (Signed)
HPI:  The patient is an 82 year old gentleman who underwent repair of an acute type 1 aortic dissection by Dr. Tyrone Sage on 05/08/2017.  He had resuspension repair of the aortic valve with replacement of the ascending aorta, arch de-branching, and frozen elephant trunk with placement of a 31 x 31 x 10 cm CTAG Gore stent graft, placement of a 10 x 39 VBX balloon expandable stent into the left subclavian artery, a 12 x 8-cm graft off the ascending aorta to the innominate and left carotid arteries.  He continues to feel well and goes to the gym 5 days/week doing an elliptical machine and lifting weights.  Current Outpatient Medications  Medication Sig Dispense Refill   citalopram (CELEXA) 20 MG tablet TAKE 1 TABLET BY MOUTH EVERY DAY 30 tablet 0   folic acid (FOLVITE) 1 MG tablet Take 1 tablet by mouth daily.     hydrochlorothiazide (MICROZIDE) 12.5 MG capsule Take 1 capsule (12.5 mg total) by mouth daily. 90 capsule 0   metoprolol tartrate (LOPRESSOR) 25 MG tablet TAKE 1/2 TABLET TWICE A DAY BY MOUTH 90 tablet 1   Misc Natural Products (GLUCOS-CHONDROIT-MSM COMPLEX) TABS Take 1 tablet by mouth 3 (three) times daily.     Omega-3 Fatty Acids (FISH OIL) 1000 MG CAPS Take 1 capsule by mouth daily.     simvastatin (ZOCOR) 20 MG tablet TAKE 1 TABLET BY MOUTH AT BEDTIME**NEED OFFICE VISIT** 30 tablet 0   warfarin (COUMADIN) 2.5 MG tablet TAKE 2 TABLETS BY MOUTH DAILY EXCEPT 3 TABS ON TUESDAYS & SATURDAYS OR AS DIRECTED BY ANTICOAGULATION CLINIC 220 tablet 1   No current facility-administered medications for this visit.     Physical Exam: BP 131/61 (BP Location: Left Arm)   Pulse 60   Resp 18   Wt 178 lb (80.7 kg)   SpO2 97%   BMI 27.06 kg/m  He looks well. Cardiac exam shows a regular rate and rhythm with a 2/6 systolic murmur along the right sternal border.  There is no diastolic murmur. Lungs are clear.  Diagnostic Tests:  Narrative & Impression  CLINICAL DATA:  Dissection post aortic  arch replacement, scheduled follow-up surveillance, currently asymptomatic   EXAM: CT ANGIOGRAPHY CHEST, ABDOMEN AND PELVIS   TECHNIQUE: Non-contrast CT of the chest was initially obtained.   Multidetector CT imaging through the chest, abdomen and pelvis was performed using the standard protocol during bolus administration of intravenous contrast. Multiplanar reconstructed images and MIPs were obtained and reviewed to evaluate the vascular anatomy.   RADIATION DOSE REDUCTION: This exam was performed according to the departmental dose-optimization program which includes automated exposure control, adjustment of the mA and/or kV according to patient size and/or use of iterative reconstruction technique.   CONTRAST:  ISOVUE-370 IOPAMIDOL (ISOVUE-370) INJECTION 76%   COMPARISON:  05/19/2022   FINDINGS: CTA CHEST FINDINGS   Cardiovascular: SVC patent. Heart size normal. No pericardial effusion. Satisfactory opacification of pulmonary arteries noted, and there is no evidence of pulmonary emboli. Scattered left coronary calcifications.   Tube graft repair of ascending aorta with patent side branch supplying right brachiocephalic and left common carotid arteries.   Stent graft from the distal arch into the proximal descending thoracic aorta, proximal tines incompletely apposed a small amount of surrounding thrombus as before. Patent stent through proximal tines extending across the left subclavian artery origin.   Persistent dissection flap through the native distal descending segment from just below the stent graft at the level of the left pulmonary artery,  maximum diameter 3.2 cm. Both true and false lumens remain patent. No evidence of leak or impending rupture.   Mediastinum/Nodes: No mediastinal hematoma, mass or adenopathy.   Lungs/Pleura: No pleural effusion.  No pneumothorax.  Lungs clear.   Musculoskeletal: Sternotomy wires. Mild thoracic  dextroscoliosis without evident underlying vertebral anomaly.   Review of the MIP images confirms the above findings.   CTA ABDOMEN AND PELVIS FINDINGS   VASCULAR   Aorta: Dissection extends through the length of the abdominal aorta, with continued patency of true and false lumens. Supraceliac segment measures up to 3.4 cm diameter, infrarenal segment ectatic up to 2.9 cm. No evidence of leak or impending rupture.   Celiac: Patent, supplied by true lumen. Short-segment stenosis at the level of the median arcuate ligament of the diaphragm. Classic distal branch anatomy.   SMA: Patent, supplied by true lumen. Partially calcified ostial plaque without stenosis.   Renals: Single right, supplied by true lumen, patent. Duplicated left, superior dominant. The dissection flap extends to the origin of the superior left renal artery without high-grade stenosis. The more diminutive inferior left renal artery arises from the true lumen, appears widely patent.   IMA: Patent, arising from true lumen.   Inflow: Dissection extends across the bifurcation and terminating in the proximal common iliac arteries bilaterally. Right common iliac ectatic up to 1.9 cm diameter, left 1.8 cm. Bilateral internal and external iliac artery arteries are mildly ectatic and tortuous, patent. Visualized proximal outflow patent.   Veins: No obvious venous abnormality within the limitations of this arterial phase study.   Review of the MIP images confirms the above findings.   NON-VASCULAR   Hepatobiliary: 2 subcentimeter partially calcified stones in the dependent aspect of the nondilated gallbladder. No focal liver lesion or biliary ductal dilatation.   Pancreas: Unremarkable. No pancreatic ductal dilatation or surrounding inflammatory changes.   Spleen: Normal in size without focal abnormality.   Adrenals/Urinary Tract: No adrenal mass. Focal parenchymal loss in the right lower pole kidney as  before. No new renal lesion or hydronephrosis. Urinary bladder partially distended.   Stomach/Bowel: Stomach nondistended, without acute finding. Small bowel decompressed. Normal appendix. The colon is partially distended, without acute finding.   Lymphatic: No abdominal or pelvic adenopathy.   Reproductive: Mild prostate enlargement.   Other: No ascites.  Left pelvic phleboliths.  No free air.   Musculoskeletal: Stable L1 vertebral compression deformity. Degenerative disc disease L5-S1 with bridging anterior osteophytes.   Review of the MIP images confirms the above findings.   IMPRESSION: 1. Stable appearance of thoracoabdominal aortic dissection status post aortic arch replacement and descending thoracic aortic stent graft. No evidence of leak or impending rupture. 2. Cholelithiasis.     Electronically Signed   By: Corlis Leak M.D.   On: 05/30/2023 12:54    Impression:  His aortic and arterial grafts as well as his stent graft appear stable following his surgery for acute type I aortic dissection in 2018.  I reviewed the CTA images with him and answered his questions.  His last echo in April 2021 showed stable aortic valve function with trivial insufficiency.  I stressed the importance of continued good blood pressure control in preventing further enlargement and recurrent aortic dissection.  I cautioned him against doing any heavy lifting or strenuous physical activity that may require a Valsalva maneuver and could suddenly raise his blood pressure to high levels.  Plan:  I will plan to see him back in 1 year with a CTA  of the chest, abdomen, and pelvis.  I spent 15 minutes performing this established patient evaluation and > 50% of this time was spent face to face counseling and coordinating the care of this patient's aortic dissection.    Alleen Borne, MD [facility] [phone]

## 2023-06-13 ENCOUNTER — Ambulatory Visit: Payer: Medicare Other

## 2023-06-13 DIAGNOSIS — Z7901 Long term (current) use of anticoagulants: Secondary | ICD-10-CM

## 2023-06-13 LAB — POCT INR: INR: 1.9 — AB (ref 2.0–3.0)

## 2023-06-13 NOTE — Patient Instructions (Addendum)
Pre visit review using our clinic review tool, if applicable. No additional management support is needed unless otherwise documented below in the visit note.  Increase dose to take 3 tablets and then change weekly dose to take 2 tablets except take 3 tablets on Tuesday and Saturday. Recheck in 3 weeks.

## 2023-06-13 NOTE — Progress Notes (Signed)
Pt has a hx of being in the middle of his INR range very consistently but last INR check 2 weeks ago was 2.0 and today it is 1.9. Pt denies any changes. Due to subtherapeutic and near subtherapeutic recent INRs there was a change to move INR closer to INR goal of 2.5. Increase dose to take 3 tablets and then change weekly dose to take 2 tablets except take 3 tablets on Tuesday and Saturday. Recheck in 3 weeks.

## 2023-06-15 NOTE — Progress Notes (Signed)
CARDIOLOGY OFFICE NOTE  Date:  06/22/2023    Don Medina Date of Birth: Jul 13, 1941 Medical Record #[id number]  PCP:  Kristian Covey, MD  Cardiologist:  Townsend Roger   Chief Complaint  Patient presents with   History of atrial fibrillation   S/P aortic dissection repair   Follow-up    1 year     History of Present Illness: Don Medina is a 82 y.o. male seen f/u  for acute type A aortic dissection.    He has a history of HLD and PVCs. No history of CAD, heart attack, HLD or DM. No prior tobacco use. Had new onset chest and back pain September 2018 with dissection BP managed by primary now on beta blocker, diuretic and ARB    He was taken to the OR by Dr. Tyrone Sage 05/08/17 he underwent grafting of the ascending aorta above the sinus with resuspension of AV  Right innominate tied off. Balloon stent graft extending past left subclavian with graft to graft in ascending aorta bifucated to innominate and carotid and graft to graft left subclavian. 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    Echo  11/29/19 Normal EF trivial AR  Cardiac CTA 06/09/17 with no significant CAD stable repair   Had right popliteal aneurysm Last duplex  05/2023 stable 2.0 cm   Daughter in [place] has liver failure from ETOH   Has been maintained on coumadin for PAF and left axillary vein thrombosis   Review of INR;s have been therapeutic for a year   Doing well no bleeding issues Compliant with meds No dizziness He continues to feel well and goes to the gym 5 days/week doing an elliptical machine and lifting weights    Past Medical History:  Diagnosis Date   ADJ DISORDER WITH MIXED ANXIETY & DEPRESSED MOOD 09/08/2009   Qualifier: Diagnosis of  By: Caryl Never MD, Bruce     Arthritis    Atrial fibrillation (HCC) [I48.91] 05/23/2017   Blood in the stool     BRADYCARDIA 03/10/2010   Qualifier: Diagnosis of  By: Caryl Never MD, Bruce     CHEST PAIN 03/19/2010   Qualifier: Diagnosis of  By: Elyn Peers, LPN, Christine     Chicken pox    COLONIC POLYPS, HX OF 01/13/2009   Qualifier: Diagnosis of  By: Gabriel Rung LPN, Nancy     Episode of generalized weakness 02/17/2016   Essential hypertension 03/15/2022   GERD (gastroesophageal reflux disease) 10/03/2012   Hay fever    History of colonic polyps    History of colonoscopy    Hyperlipidemia    MUSCLE STRAIN, HAMSTRING MUSCLE 09/08/2009   Qualifier: Diagnosis of  By: Caryl Never MD, Marisue Ivan DIZZINESS 03/05/2010   Qualifier: Diagnosis of  By: Caryl Never MD, Bruce     Other premature beats 03/17/2010   Qualifier: Diagnosis of  By: Ladona Ridgel, MD, Jerrell Mylar    Premature ventricular contraction 01/05/2011   S/P aortic dissection repair 05/08/2017   THROMBOCYTOPENIA 03/05/2010   Qualifier: Diagnosis of  By: Caryl Never MD, Bruce     Weakness 12/09/2015    Past Surgical History:  Procedure Laterality Date   ABDOMINAL ADHESION SURGERY  2007   AORTIC INTERVENTION N/A 05/08/2017   Procedure: HYPOTHERMIC CIRCULATORY ARREST;  Surgeon: Delight Ovens, MD;  Location: [facility] OR;  Service: Open Heart Surgery;  Laterality: N/A;  ASCENDING AORTIC ROOT REPLACEMENT N/A 05/08/2017   Procedure: REPLACEMENT OF ASCENDING AORTIC ARCH;  Surgeon: Delight Ovens, MD;  Location: [facility] OR;  Service: Open Heart Surgery;  Laterality: N/A;   CANNULATION FOR CARDIOPULMONARY BYPASS Right 05/08/2017   Procedure: AXILLARY CANNULATION;  Surgeon: Delight Ovens, MD;  Location: [facility] OR;  Service: Open Heart Surgery;  Laterality: Right;   ENDOVASCULAR REPAIR/STENT GRAFT  05/08/2017   Procedure: STENT OF LEFT SUBCLAVIAN ARTERY with 10x39VBX;  Surgeon: Delight Ovens, MD;  Location: [facility] OR;  Service: Open Heart Surgery;;   herniated diks surgery L-5  1996   herniated disk surgery c 6-7  1986   INTRAOPERATIVE TRANSESOPHAGEAL ECHOCARDIOGRAM  N/A 05/08/2017   Procedure: INTRAOPERATIVE TRANSESOPHAGEAL ECHOCARDIOGRAM;  Surgeon: Delight Ovens, MD;  Location: [facility] OR;  Service: Open Heart Surgery;  Laterality: N/A;   REPLACEMENT ASCENDING AORTA N/A 05/08/2017   Procedure: REPLACEMENT ASCENDING AORTA;  Surgeon: Delight Ovens, MD;  Location: [facility] OR;  Service: Open Heart Surgery;  Laterality: N/A;   THORACIC AORTIC ENDOVASCULAR STENT GRAFT  05/08/2017   Procedure: STENT GRAFTING OF DESCENDING THORACIC AORTA;  Surgeon: Delight Ovens, MD;  Location: [facility] OR;  Service: Open Heart Surgery;;     Medications: Current Meds  Medication Sig   citalopram (CELEXA) 20 MG tablet TAKE 1 TABLET BY MOUTH EVERY DAY   folic acid (FOLVITE) 1 MG tablet Take 1 tablet by mouth daily.   hydrochlorothiazide (MICROZIDE) 12.5 MG capsule Take 1 capsule (12.5 mg total) by mouth daily.   metoprolol tartrate (LOPRESSOR) 25 MG tablet TAKE 1/2 TABLET TWICE A DAY BY MOUTH   Misc Natural Products (GLUCOS-CHONDROIT-MSM COMPLEX) TABS Take 1 tablet by mouth 3 (three) times daily.   Omega-3 Fatty Acids (FISH OIL) 1000 MG CAPS Take 1 capsule by mouth daily.   simvastatin (ZOCOR) 20 MG tablet TAKE 1 TABLET BY MOUTH AT BEDTIME**NEED OFFICE VISIT**   warfarin (COUMADIN) 2.5 MG tablet TAKE 2 TABLETS BY MOUTH DAILY EXCEPT 3 TABS ON TUESDAYS & SATURDAYS OR AS DIRECTED BY ANTICOAGULATION CLINIC     Allergies: Allergies  Allergen Reactions   Heparin     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 Recent studies have raised concern that fluoroquinolone antibiotics could be associated with an increased risk of aortic aneurysm Fluoroquinolones have non-antimicrobial properties that might jeopardise the integrity of the extracellular matrix of the vascular wall In a  propensity score matched cohort study in [place], there was a 66% increased rate of aortic aneurysm or dissection  associated with oral fluoroquinolone use, compared wit    Social History: The patient  reports that he quit smoking about 48 years ago. His smoking use included cigarettes. He has never used smokeless tobacco. He reports that he does not drink alcohol and does not use drugs.   Family History: The patient's family history includes Alzheimer's disease (age of onset: 33) in his father; Cancer in his brother; Dementia in his father; Hypertension in an other family member; Stroke (age of onset: 13) in his mother; Sudden death in an other family member.   Review of Systems: Please see the history of present illness.   Otherwise, the review of systems is positive for none.   All other systems are reviewed and negative.   Physical Exam: VS:  BP 124/66 (BP Location: Left Arm, Patient Position: Sitting, Cuff Size: Normal)   Pulse (!) 58  Resp 16   Ht 5\' 8"  (1.727 m)   Wt 175 lb 3.2 oz (79.5 kg)   SpO2 97%   BMI 26.64 kg/m  .  BMI Body mass index is 26.64 kg/m.  Wt Readings from Last 3 Encounters:  06/22/23 175 lb 3.2 oz (79.5 kg)  06/21/23 177 lb (80.3 kg)  06/16/23 173 lb (78.5 kg)    Affect appropriate Healthy:  appears stated age HEENT: normal Neck supple with no adenopathy JVP normal no bruits no thyromegaly Lungs clear with no wheezing and good diaphragmatic motion Heart:  S1/S2 AR murmur, no rub, gallop or click PMI normal post sternotomy  Abdomen: benighn, BS positve, no tenderness, no AAA no bruit.  No HSM or HJR Distal pulses intact with no bruits No edema Neuro non-focal Skin warm and dry No muscular weakness    LABORATORY DATA:  EKG:   06/22/2023 SR rate 54 nonspecific ST changes   Lab Results  Component Value Date   WBC 7.3 10/12/2021   HGB 15.0 10/12/2021   HCT 43.4 10/12/2021   PLT 115.0 (L) 10/12/2021   GLUCOSE 109 (H) 03/30/2022   CHOL 141 10/12/2021   TRIG 99.0 10/12/2021   HDL 47.90 10/12/2021   LDLCALC 74 10/12/2021   ALT 16 10/12/2021   AST 26  10/12/2021   NA 137 03/30/2022   K 4.0 03/30/2022   CL 100 03/30/2022   CREATININE 1.15 03/30/2022   BUN 18 03/30/2022   CO2 28 03/30/2022   TSH 5.980 (H) 06/01/2017   PSA 1.25 10/12/2021   INR 1.9 (A) 06/13/2023   HGBA1C 5.9 05/15/2021     BNP (last 3 results) No results for input(s): "BNP" in the last 8760 hours.  ProBNP (last 3 results) No results for input(s): "PROBNP" in the last 8760 hours.   Other Studies Reviewed Today:  Vascular LE Artery: 03/25/21 Summary:  Right: Patent popliteal artery with maximum diameter measuring 1.94 cm.   Left: Patent popliteal artery with maximum diameter measuring 1.30 cm.    TTE:  11/29/19    IMPRESSIONS     1. Normal GLS - 20.1 . Left ventricular ejection fraction, by estimation,  is 60 to 65%. The left ventricle has normal function. The left ventricle  has no regional wall motion abnormalities. Left ventricular diastolic  parameters were normal.   2. Right ventricular systolic function is normal. The right ventricular  size is normal.   3. Left atrial size was moderately dilated.   4. The mitral valve is normal in structure. No evidence of mitral valve  regurgitation. No evidence of mitral stenosis.   5. The aortic valve is tricuspid. Aortic valve regurgitation is trivial.  No aortic stenosis is present.   6. Post type A dissection with repair no residual intimal defect noted  and normal aortic root size.   7. The inferior vena cava is normal in size with greater than 50%  respiratory variability, suggesting right atrial pressure of 3 mmHg.   Comparison(s): 04/11/18 EF 55-60%.    Assessment/Plan:   1. Type A Aortic Dissection F/U CVTS. BP under good control   Review of TTE 11/29/19 shows trivial  AR normal EF and normal aortic root size with mild dilatation at the sinus level CTA 05/19/22 unchanged and stable He is now seeing Dr Laneta Simmers since Dr Tyrone Sage retired Will update echo to make sure AV re- suspension holding   2.  Orthostatic hypotension/dizziness - improved with lower BP meds    3. Post op  AF - now off amiodarone maintaining NSR   4. +HIT noted PLT still down 115 10/12/21 will order CBC/PLT  5. Popliteal Aneurysm:  Duplex 03/25/21 right popliteal stable 1.94 cm stable on duplex 09/23/22 at 2 cm   6. HTN:  improved with addition of Loartan / HCTZ by Dr Caryl Never   Given PAF, popliteal aneurysm ( currently no mural thrombus to potentially embolize distally given anticoagulation ) And thrombosis of the left subclavian vein would suggest that life long coumadin appropriate      F/U in a year   Charlton Haws

## 2023-06-16 ENCOUNTER — Ambulatory Visit: Payer: Medicare Other | Admitting: Gastroenterology

## 2023-06-16 ENCOUNTER — Other Ambulatory Visit: Payer: Self-pay | Admitting: Family Medicine

## 2023-06-16 ENCOUNTER — Encounter: Payer: Self-pay | Admitting: Gastroenterology

## 2023-06-16 VITALS — BP 122/58 | HR 67 | Ht 68.0 in | Wt 173.0 lb

## 2023-06-16 DIAGNOSIS — K648 Other hemorrhoids: Secondary | ICD-10-CM | POA: Diagnosis not present

## 2023-06-16 NOTE — Progress Notes (Signed)
Agree with assessment and plan as outlined.  We can discuss if he wants to do banding on Coumadin, which is higher than average risk but overall risk is not high.  It depends on how much hemorrhoid bother him etc.  I will assessment him next week.  Thanks

## 2023-06-16 NOTE — Patient Instructions (Addendum)
Follow up with Dr. Adela Lank for hemorrhoid banding on 06/21/23 @ 4 pm.   _______________________________________________________  If your blood pressure at your visit was 140/90 or greater, please contact your primary care physician to follow up on this.  _______________________________________________________  If you are age 81 or older, your body mass index should be between 23-30. Your Body mass index is 26.3 kg/m. If this is out of the aforementioned range listed, please consider follow up with your Primary Care Provider.  If you are age 81 or younger, your body mass index should be between 19-25. Your Body mass index is 26.3 kg/m. If this is out of the aformentioned range listed, please consider follow up with your Primary Care Provider.   ________________________________________________________  The [facility] providers would like to encourage you to use [facility] to communicate with providers for non-urgent requests or questions.  Due to long hold times on the telephone, sending your provider a message by [facility] may be a faster and more efficient way to get a response.  Please allow 48 business hours for a response.  Please remember that this is for non-urgent requests.  _______________________________________________________

## 2023-06-16 NOTE — Progress Notes (Signed)
06/16/2023 Don Medina [id number] [birth year]   HISTORY OF PRESENT ILLNESS:  This is an 82 year old male who is a patient of Dr. Lanetta Inch.  He is here today with complaints of with internal hemorrhoids.  He discussed hemorrhoid banding with Dr. Adela Lank back in October 2019.  He decided to proceed with conservative measures at that time, but returns here now wanting hemorrhoid banding.  He does have some bleeding intermittently, low volume, but mostly is tired of the hemorrhoids protruding out, etc.  He is still on Coumadin which he has been on for several years.  Moves his bowels regularly, no issues.  His last colonoscopy was in 2017 or 2018 at the [place].  Does not look like we ever got those records.  He reported that was normal.    Past Medical History:  Diagnosis Date   ADJ DISORDER WITH MIXED ANXIETY & DEPRESSED MOOD 09/08/2009   Qualifier: Diagnosis of  By: Caryl Never MD, Bruce     Arthritis    Atrial fibrillation (HCC) [I48.91] 05/23/2017   Blood in the stool    BRADYCARDIA 03/10/2010   Qualifier: Diagnosis of  By: Caryl Never MD, Bruce     CHEST PAIN 03/19/2010   Qualifier: Diagnosis of  By: [place], LPN, Christine     Chicken pox    COLONIC POLYPS, HX OF 01/13/2009   Qualifier: Diagnosis of  By: Gabriel Rung LPN, Nancy     Episode of generalized weakness 02/17/2016   Essential hypertension 03/15/2022   GERD (gastroesophageal reflux disease) 10/03/2012   Hay fever    History of colonic polyps    History of colonoscopy    Hyperlipidemia    MUSCLE STRAIN, HAMSTRING MUSCLE 09/08/2009   Qualifier: Diagnosis of  By: Caryl Never MD, Marisue Ivan DIZZINESS 03/05/2010   Qualifier: Diagnosis of  By: Caryl Never MD, Bruce     Other premature beats 03/17/2010   Qualifier: Diagnosis of  By: Ladona Ridgel, MD, Jerrell Mylar    Premature ventricular contraction 01/05/2011   S/P aortic dissection repair 05/08/2017   THROMBOCYTOPENIA 03/05/2010   Qualifier: Diagnosis of  By: Caryl Never MD,  Bruce     Weakness 12/09/2015   Past Surgical History:  Procedure Laterality Date   ABDOMINAL ADHESION SURGERY  2007   AORTIC INTERVENTION N/A 05/08/2017   Procedure: HYPOTHERMIC CIRCULATORY ARREST;  Surgeon: Delight Ovens, MD;  Location: [facility] OR;  Service: Open Heart Surgery;  Laterality: N/A;   ASCENDING AORTIC ROOT REPLACEMENT N/A 05/08/2017   Procedure: REPLACEMENT OF ASCENDING AORTIC ARCH;  Surgeon: Delight Ovens, MD;  Location: [facility] OR;  Service: Open Heart Surgery;  Laterality: N/A;   CANNULATION FOR CARDIOPULMONARY BYPASS Right 05/08/2017   Procedure: AXILLARY CANNULATION;  Surgeon: Delight Ovens, MD;  Location: [facility] OR;  Service: Open Heart Surgery;  Laterality: Right;   ENDOVASCULAR REPAIR/STENT GRAFT  05/08/2017   Procedure: STENT OF LEFT SUBCLAVIAN ARTERY with 10x39VBX;  Surgeon: Delight Ovens, MD;  Location: [facility] OR;  Service: Open Heart Surgery;;   herniated diks surgery L-5  1996   herniated disk surgery c 6-7  1986   INTRAOPERATIVE TRANSESOPHAGEAL ECHOCARDIOGRAM N/A 05/08/2017   Procedure: INTRAOPERATIVE TRANSESOPHAGEAL ECHOCARDIOGRAM;  Surgeon: Delight Ovens, MD;  Location: [facility] OR;  Service: Open Heart Surgery;  Laterality: N/A;   REPLACEMENT ASCENDING AORTA N/A 05/08/2017   Procedure: REPLACEMENT ASCENDING AORTA;  Surgeon: Delight Ovens, MD;  Location: [facility] OR;  Service: Open Heart Surgery;  Laterality: N/A;   THORACIC AORTIC  ENDOVASCULAR STENT GRAFT  05/08/2017   Procedure: STENT GRAFTING OF DESCENDING THORACIC AORTA;  Surgeon: Delight Ovens, MD;  Location: [facility] OR;  Service: Open Heart Surgery;;    reports that he quit smoking about 48 years ago. His smoking use included cigarettes. He has never used smokeless tobacco. He reports that he does not drink alcohol and does not use drugs. family history includes Alzheimer's disease (age of onset: 78) in his father; Cancer in his brother; Dementia in his father; Hypertension in an other family member; Stroke (age of  onset: 41) in his mother; Sudden death in an other family member. 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 Recent studies have raised concern that fluoroquinolone antibiotics could be associated with an increased risk of aortic aneurysm Fluoroquinolones have non-antimicrobial properties that might jeopardise the integrity of the extracellular matrix of the vascular wall In a  propensity score matched cohort study in [place], there was a 66% increased rate of aortic aneurysm or dissection associated with oral fluoroquinolone use, compared wit      Outpatient Encounter Medications as of 06/16/2023  Medication Sig   citalopram (CELEXA) 20 MG tablet TAKE 1 TABLET BY MOUTH EVERY DAY   folic acid (FOLVITE) 1 MG tablet Take 1 tablet by mouth daily.   hydrochlorothiazide (MICROZIDE) 12.5 MG capsule Take 1 capsule (12.5 mg total) by mouth daily.   metoprolol tartrate (LOPRESSOR) 25 MG tablet TAKE 1/2 TABLET TWICE A DAY BY MOUTH   Misc Natural Products (GLUCOS-CHONDROIT-MSM COMPLEX) TABS Take 1 tablet by mouth 3 (three) times daily.   Omega-3 Fatty Acids (FISH OIL) 1000 MG CAPS Take 1 capsule by mouth daily.   simvastatin (ZOCOR) 20 MG tablet TAKE 1 TABLET BY MOUTH AT BEDTIME**NEED OFFICE VISIT**   warfarin (COUMADIN) 2.5 MG tablet TAKE 2 TABLETS BY MOUTH DAILY EXCEPT 3 TABS ON TUESDAYS & SATURDAYS OR AS DIRECTED BY ANTICOAGULATION CLINIC   No facility-administered encounter medications on file as of 06/16/2023.    REVIEW OF SYSTEMS  : All other systems reviewed and negative except where noted in the History of Present Illness.   PHYSICAL EXAM: BP (!) 122/58 (BP Location: Left Arm, Patient Position: Sitting, Cuff Size: Normal)   Pulse 67   Ht 5\' 8"  (1.727 m)   Wt 173 lb (78.5 kg)   SpO2 98%   BMI 26.30 kg/m  General: Well developed white male in no  acute distress Head: Normocephalic and atraumatic Eyes:  Sclerae anicteric, conjunctiva pink. Ears: Normal auditory acuity Musculoskeletal: Symmetrical with no gross deformities  Neurological: Alert oriented x 4, grossly non-focal Psychological:  Alert and cooperative. Normal mood and affect  ASSESSMENT AND PLAN: *82 year old male with internal hemorrhoids.  He discussed hemorrhoid banding with Dr. Adela Lank back in October 2019.  He decided to proceed with conservative measures at that time, but returns here now wanting hemorrhoid banding.  He does have some bleeding intermittently, low volume, but mostly is tired of the hemorrhoids protruding out, etc.  He is still on Coumadin which he has been on for several years.  Dr. Adela Lank discussed the slightly increased risk with bleeding while on Coumadin although risk overall is very low.  His last colonoscopy was in 2017 or 2018 at the [place].  Does not look like we ever got those records.  He reported that was normal.  Dr. Adela Lank does have an opening for hemorrhoid banding next week.  Will schedule him for that.   CC:  Kristian Covey, MD

## 2023-06-21 ENCOUNTER — Ambulatory Visit: Payer: Medicare Other | Admitting: Gastroenterology

## 2023-06-21 ENCOUNTER — Encounter: Payer: Self-pay | Admitting: Gastroenterology

## 2023-06-21 VITALS — BP 112/70 | HR 79 | Ht 68.0 in | Wt 177.0 lb

## 2023-06-21 DIAGNOSIS — K642 Third degree hemorrhoids: Secondary | ICD-10-CM | POA: Diagnosis not present

## 2023-06-21 NOTE — Progress Notes (Signed)
82 year old male here for a visit for hemorrhoid banding.  He was seen by Doug Sou recently for symptomatic hemorrhoids and she referred him to me for banding.  He has been having ongoing issues with hemorrhoids for years.  He has grade 3 prolapse with irritation and that is the main symptom that bothers him.  He has scant bleeding from this at times but not significant.  He does not strain on the toilet or have constipation.  His last colonoscopy was in 2017 and was normal, told he did not need another colonoscopy.  This was done at the [place], we do not have that record on file.  He is looking for intervention on his hemorrhoids and does not want to have surgery.  He is very interested in hemorrhoid banding and wanted to discuss doing that today.  He has a history of A-fib and aortic repair, on chronic Coumadin.  He generally feels well without complaints otherwise today.  I had a lengthy discussion with him about risks and benefits of hemorrhoid banding.  Risks including pain/discomfort and most importantly risk for bleeding post procedure up to 2 weeks.  Risks of bleeding are higher on Coumadin use however not prohibitive and given his risks off Coumadin I think reasonable to offer this while on Coumadin.  He understands this and agrees.  PROCEDURE NOTE: The patient presents with symptomatic grade III  hemorrhoids, requesting rubber band ligation of his/her hemorrhoidal disease.  All risks, benefits and alternative forms of therapy were described and informed consent was obtained.  The anorectum was pre-medicated with 0.125% nitroglycerin The decision was made to band the LL internal hemorrhoid, and the [facility] System was used to perform band ligation without complication.  Digital anorectal examination was then performed to assure proper positioning of the band, and to adjust the banded tissue as required.  The patient was discharged home without pain or other issues.  Dietary and behavioral  recommendations were given and along with follow-up instructions.      The patient will return  for  follow-up and possible additional banding as required. No complications were encountered and the patient tolerated the procedure well.   Harlin Rain, MD [facility]

## 2023-06-21 NOTE — Patient Instructions (Addendum)
HEMORRHOID BANDING PROCEDURE    FOLLOW-UP CARE   The procedure you have had should have been relatively painless since the banding of the area involved does not have nerve endings and there is no pain sensation.  The rubber band cuts off the blood supply to the hemorrhoid and the band may fall off as soon as 48 hours after the banding (the band may occasionally be seen in the toilet bowl following a bowel movement). You may notice a temporary feeling of fullness in the rectum which should respond adequately to plain Tylenol or Motrin.  Following the banding, avoid strenuous exercise that evening and resume full activity the next day.  A sitz bath (soaking in a warm tub) or bidet is soothing, and can be useful for cleansing the area after bowel movements.     To avoid constipation, take two tablespoons of natural wheat bran, natural oat bran, flax, Benefiber or any over the counter fiber supplement and increase your water intake to 7-8 glasses daily.    Unless you have been prescribed anorectal medication, do not put anything inside your rectum for two weeks: No suppositories, enemas, fingers, etc.  Occasionally, you may have more bleeding than usual after the banding procedure.  This is often from the untreated hemorrhoids rather than the treated one.  Don't be concerned if there is a tablespoon or so of blood.  If there is more blood than this, lie flat with your bottom higher than your head and apply an ice pack to the area. If the bleeding does not stop within a half an hour or if you feel faint, call our office at [phone] or go to the emergency room.  Problems are not common; however, if there is a substantial amount of bleeding, severe pain, chills, fever or difficulty passing urine (very rare) or other problems, you should call us at [phone] or report to the nearest emergency room.  Do not stay seated continuously for more than 2-3 hours for a day or two after the procedure.   Tighten your buttock muscles 10-15 times every two hours and take 10-15 deep breaths every 1-2 hours.  Do not spend more than a few minutes on the toilet if you cannot empty your bowel; instead re-visit the toilet at a later time.    You have been scheduled for a 2nd banding appointment on January 7th at 4pm. Please arrive 10 minutes early for registration. If you need to reschedule or cancel this appointment please call [phone] as soon as possible. Thank you.    Thank you for entrusting me with your care and for choosing [facility], Dr. Ileene Patrick

## 2023-06-22 ENCOUNTER — Encounter: Payer: Self-pay | Admitting: Cardiovascular Disease

## 2023-06-22 ENCOUNTER — Ambulatory Visit: Payer: Medicare Other | Attending: Cardiovascular Disease | Admitting: Cardiovascular Disease

## 2023-06-22 VITALS — BP 124/66 | HR 58 | Resp 16 | Ht 68.0 in | Wt 175.2 lb

## 2023-06-22 DIAGNOSIS — Z8679 Personal history of other diseases of the circulatory system: Secondary | ICD-10-CM

## 2023-06-22 DIAGNOSIS — I951 Orthostatic hypotension: Secondary | ICD-10-CM | POA: Diagnosis not present

## 2023-06-22 DIAGNOSIS — I724 Aneurysm of artery of lower extremity: Secondary | ICD-10-CM | POA: Diagnosis not present

## 2023-06-22 NOTE — Patient Instructions (Signed)
Medication Instructions:  No changes *If you need a refill on your cardiac medications before your next appointment, please call your pharmacy*   Lab Work: none If you have labs (blood work) drawn today and your tests are completely normal, you will receive your results only by: MyChart Message (if you have MyChart) OR A paper copy in the mail If you have any lab test that is abnormal or we need to change your treatment, we will call you to review the results.   Testing/Procedures: none   Follow-Up: At [facility], you and your health needs are our priority.  As part of our continuing mission to provide you with exceptional heart care, we have created designated Provider Care Teams.  These Care Teams include your primary Cardiologist (physician) and Advanced Practice Providers (APPs -  Physician Assistants and Nurse Practitioners) who all work together to provide you with the care you need, when you need it.   Your next appointment:   12 month(s)  Provider:   Charlton Haws, MD

## 2023-07-04 ENCOUNTER — Ambulatory Visit: Payer: Medicare Other

## 2023-07-04 DIAGNOSIS — Z7901 Long term (current) use of anticoagulants: Secondary | ICD-10-CM | POA: Diagnosis not present

## 2023-07-04 LAB — POCT INR: INR: 2 (ref 2.0–3.0)

## 2023-07-04 NOTE — Progress Notes (Signed)
Pt has a hx of being in the middle of his INR range very consistently but last three INR checks have been 2.0, 1.9 and 2.0 today.  Pt denies any changes. Due to subtherapeutic and near subtherapeutic recent INRs there was a change to move INR closer to INR goal of 2.5. Change weekly dose to take 2 tablets except take 3 tablets on Tuesday, Thursday and Saturday. Recheck in 3 weeks.

## 2023-07-04 NOTE — Patient Instructions (Addendum)
Pre visit review using our clinic review tool, if applicable. No additional management support is needed unless otherwise documented below in the visit note.  Change weekly dose to take 2 tablets except take 3 tablets on Tuesday, Thursday and Saturday. Recheck in 3 weeks.

## 2023-07-11 ENCOUNTER — Other Ambulatory Visit: Payer: Self-pay | Admitting: Family Medicine

## 2023-07-11 ENCOUNTER — Encounter: Payer: Self-pay | Admitting: Surgery

## 2023-07-25 ENCOUNTER — Ambulatory Visit: Payer: Medicare Other

## 2023-07-25 ENCOUNTER — Other Ambulatory Visit: Payer: Self-pay | Admitting: Family Medicine

## 2023-07-25 DIAGNOSIS — Z7901 Long term (current) use of anticoagulants: Secondary | ICD-10-CM

## 2023-07-25 LAB — POCT INR: INR: 2.7 (ref 2.0–3.0)

## 2023-07-25 NOTE — Progress Notes (Signed)
Continue 2 tablets except take 3 tablets on Tuesday, Thursday and Saturday. Recheck in 6  weeks.

## 2023-07-25 NOTE — Patient Instructions (Addendum)
Pre visit review using our clinic review tool, if applicable. No additional management support is needed unless otherwise documented below in the visit note.  Continue 2 tablets except take 3 tablets on Tuesday, Thursday and Saturday. Recheck in 6  weeks.

## 2023-07-29 ENCOUNTER — Encounter: Payer: Self-pay | Admitting: Gastroenterology

## 2023-07-30 ENCOUNTER — Other Ambulatory Visit: Payer: Self-pay | Admitting: Family Medicine

## 2023-08-09 ENCOUNTER — Encounter: Payer: Self-pay | Admitting: Family Medicine

## 2023-08-13 ENCOUNTER — Other Ambulatory Visit: Payer: Self-pay | Admitting: Family Medicine

## 2023-08-16 ENCOUNTER — Encounter: Payer: Self-pay | Admitting: Gastroenterology

## 2023-08-16 ENCOUNTER — Ambulatory Visit: Payer: Medicare Other | Admitting: Gastroenterology

## 2023-08-16 VITALS — BP 116/60 | HR 59 | Ht 68.0 in | Wt 175.0 lb

## 2023-08-16 DIAGNOSIS — K642 Third degree hemorrhoids: Secondary | ICD-10-CM | POA: Diagnosis not present

## 2023-08-16 DIAGNOSIS — Z7901 Long term (current) use of anticoagulants: Secondary | ICD-10-CM

## 2023-08-16 NOTE — Progress Notes (Signed)
 HPI :  83 year old male here for follow-up visit for his hemorrhoids.  I last saw him on November 12.  Recall he has had grade 3 hemorrhoids for some time, prolapse and irritation were the main symptoms that have bothered him.  His colonoscopy in 2017 was normal, done at the [facility].  Recall he has a history of A-fib with aortic repair and chronic Coumadin .  I offered him hemorrhoid banding at the last visit.  We discussed risks and benefits of this and following extensive discussion he wanted to continue with it on Coumadin .  Left lateral hemorrhoid was banded.  He states he tolerated it very well.  No pain.  He states he has had 75% improvement and the main prolapsing issue that has bothered him since resolved.  He is doing really well in this regard.  He does not really have any significant symptoms that bother him anymore in regards to his hemorrhoids.  He has occasional trace blood in the setting of Coumadin  when he wipes himself but nothing significant.  We discussed if he wanted to pursue further banding or not.    Past Medical History:  Diagnosis Date   ADJ DISORDER WITH MIXED ANXIETY & DEPRESSED MOOD 09/08/2009   Qualifier: Diagnosis of  By: Micheal MD, Bruce     Arthritis    Atrial fibrillation (HCC) [I48.91] 05/23/2017   Blood in the stool    BRADYCARDIA 03/10/2010   Qualifier: Diagnosis of  By: Micheal MD, Bruce     CHEST PAIN 03/19/2010   Qualifier: Diagnosis of  By: York, LPN, Christine     Chicken pox    COLONIC POLYPS, HX OF 01/13/2009   Qualifier: Diagnosis of  By: Lavinia LPN, Nancy     Episode of generalized weakness 02/17/2016   Essential hypertension 03/15/2022   GERD (gastroesophageal reflux disease) 10/03/2012   Hay fever    History of colonic polyps    History of colonoscopy    Hyperlipidemia    MUSCLE STRAIN, HAMSTRING MUSCLE 09/08/2009   Qualifier: Diagnosis of  By: Micheal MD, Wolm DRAIN DIZZINESS 03/05/2010   Qualifier: Diagnosis of  By: Micheal  MD, Bruce     Other premature beats 03/17/2010   Qualifier: Diagnosis of  By: Waddell, MD, CODY Danelle Fallow    Premature ventricular contraction 01/05/2011   S/P aortic dissection repair 05/08/2017   THROMBOCYTOPENIA 03/05/2010   Qualifier: Diagnosis of  By: Micheal MD, Bruce     Weakness 12/09/2015     Past Surgical History:  Procedure Laterality Date   ABDOMINAL ADHESION SURGERY  2007   AORTIC INTERVENTION N/A 05/08/2017   Procedure: HYPOTHERMIC CIRCULATORY ARREST;  Surgeon: Army Dallas NOVAK, MD;  Location: [facility] OR;  Service: Open Heart Surgery;  Laterality: N/A;   ASCENDING AORTIC ROOT REPLACEMENT N/A 05/08/2017   Procedure: REPLACEMENT OF ASCENDING AORTIC ARCH;  Surgeon: Army Dallas NOVAK, MD;  Location: [facility] OR;  Service: Open Heart Surgery;  Laterality: N/A;   CANNULATION FOR CARDIOPULMONARY BYPASS Right 05/08/2017   Procedure: AXILLARY CANNULATION;  Surgeon: Army Dallas NOVAK, MD;  Location: [facility] OR;  Service: Open Heart Surgery;  Laterality: Right;   ENDOVASCULAR REPAIR/STENT GRAFT  05/08/2017   Procedure: STENT OF LEFT SUBCLAVIAN ARTERY with 10x39VBX;  Surgeon: Army Dallas NOVAK, MD;  Location: [facility] OR;  Service: Open Heart Surgery;;   herniated diks surgery L-5  1996   herniated disk surgery c 6-7  1986   INTRAOPERATIVE TRANSESOPHAGEAL ECHOCARDIOGRAM N/A 05/08/2017   Procedure:  INTRAOPERATIVE TRANSESOPHAGEAL ECHOCARDIOGRAM;  Surgeon: Army Dallas NOVAK, MD;  Location: [facility] OR;  Service: Open Heart Surgery;  Laterality: N/A;   REPLACEMENT ASCENDING AORTA N/A 05/08/2017   Procedure: REPLACEMENT ASCENDING AORTA;  Surgeon: Army Dallas NOVAK, MD;  Location: [facility] OR;  Service: Open Heart Surgery;  Laterality: N/A;   THORACIC AORTIC ENDOVASCULAR STENT GRAFT  05/08/2017   Procedure: STENT GRAFTING OF DESCENDING THORACIC AORTA;  Surgeon: Army Dallas NOVAK, MD;  Location: [facility] OR;  Service: Open Heart Surgery;;   Family History  Problem Relation Age of Onset   Alzheimer's disease Father 21    Dementia Father    Stroke Mother 38   Cancer Brother        Leukemia   Sudden death Other        family   Hypertension Other    Heart disease Neg Hx    Social History   Tobacco Use   Smoking status: Former    Current packs/day: 0.00    Types: Cigarettes    Quit date: 08/09/1974    Years since quitting: 49.0   Smokeless tobacco: Never  Vaping Use   Vaping status: Never Used  Substance Use Topics   Alcohol use: No   Drug use: No   Current Outpatient Medications  Medication Sig Dispense Refill   citalopram  (CELEXA ) 20 MG tablet TAKE 1 TABLET BY MOUTH EVERY DAY 90 tablet 0   folic acid  (FOLVITE ) 1 MG tablet Take 1 tablet by mouth daily.     hydrochlorothiazide  (MICROZIDE ) 12.5 MG capsule TAKE 1 CAPSULE BY MOUTH EVERY DAY 90 capsule 1   metoprolol  tartrate (LOPRESSOR ) 25 MG tablet TAKE 1/2 TABLET TWICE A DAY BY MOUTH 90 tablet 1   Misc Natural Products (GLUCOS-CHONDROIT-MSM COMPLEX) TABS Take 1 tablet by mouth 3 (three) times daily.     Omega-3 Fatty Acids (FISH OIL) 1000 MG CAPS Take 1 capsule by mouth daily.     simvastatin  (ZOCOR ) 20 MG tablet TAKE 1 TABLET BY MOUTH AT BEDTIME**NEED OFFICE VISIT** 30 tablet 0   warfarin (COUMADIN ) 2.5 MG tablet TAKE 2 TABLETS BY MOUTH DAILY EXCEPT 3 TABS ON TUESDAYS & SATURDAYS OR AS DIRECTED BY ANTICOAGULATION CLINIC 220 tablet 1   No current facility-administered medications for this visit.   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 Recent studies have raised concern that fluoroquinolone antibiotics could be associated with an increased risk of aortic aneurysm Fluoroquinolones have non-antimicrobial properties that might jeopardise the integrity of the extracellular matrix of the vascular wall In a  propensity score matched cohort study in [place], there was a 66% increased rate of aortic aneurysm or  dissection associated with oral fluoroquinolone use, compared wit     Review of Systems: All systems reviewed and negative except where noted in HPI.    No results found.  Physical Exam: BP 116/60   Pulse (!) 59   Ht 5' 8 (1.727 m)   Wt 175 lb (79.4 kg)   BMI 26.61 kg/m  Constitutional: Pleasant,well-developed, male in no acute distress. Neurological: Alert and oriented to person place and time. Psychiatric: Normal mood and affect. Behavior is normal.   ASSESSMENT: 83 y.o. male here for assessment of the following  1. Grade III hemorrhoids   2. Anticoagulated    He is status post hemorrhoid banding on November 12 of left  lateral hemorrhoid.  Symptomatically this is the main issue that has bothered him and he has done really well with the banding.  Done on Coumadin , fortunately he did not have any bleeding with this.  Given his symptomatic improvement, anticoagulated use and his age, we discussed if it is worthwhile to pursue any further additional banding in the setting of increased risks for bleeding while doing this on anticoagulation.  After discussion of this, probably not worth further banding at this point time given he is feeling well and hemorrhoids are not really bothering him anymore.  He can follow-up with me as needed if symptoms recur consider further banding as needed over time.  He agrees and will follow-up as needed.  Marcey Naval, MD [facility]

## 2023-09-05 ENCOUNTER — Ambulatory Visit: Payer: Medicare Other

## 2023-09-05 DIAGNOSIS — Z7901 Long term (current) use of anticoagulants: Secondary | ICD-10-CM | POA: Diagnosis not present

## 2023-09-05 LAB — POCT INR: INR: 3.4 — AB (ref 2.0–3.0)

## 2023-09-05 NOTE — Patient Instructions (Addendum)
Pre visit review using our clinic review tool, if applicable. No additional management support is needed unless otherwise documented below in the visit note.  Hold dose today and then change weekly dose to take 2 1/2 tablets daily except take 2 tablets on Monday and Thursday. Recheck in 3 weeks.

## 2023-09-05 NOTE — Progress Notes (Signed)
Pt denies any changes. Hold dose today and then change weekly dose to take 2 1/2 tablets daily except take 2 tablets on Monday and Thursday. Recheck in 3 weeks.

## 2023-09-10 ENCOUNTER — Encounter: Payer: Self-pay | Admitting: Family Medicine

## 2023-09-14 ENCOUNTER — Encounter: Payer: Self-pay | Admitting: Family Medicine

## 2023-09-14 ENCOUNTER — Ambulatory Visit: Payer: Medicare Other | Admitting: Family Medicine

## 2023-09-14 VITALS — BP 118/68 | HR 62 | Temp 97.7°F | Wt 177.6 lb

## 2023-09-14 DIAGNOSIS — E785 Hyperlipidemia, unspecified: Secondary | ICD-10-CM

## 2023-09-14 DIAGNOSIS — I10 Essential (primary) hypertension: Secondary | ICD-10-CM

## 2023-09-14 LAB — COMPREHENSIVE METABOLIC PANEL
ALT: 13 U/L (ref 0–53)
AST: 24 U/L (ref 0–37)
Albumin: 3.9 g/dL (ref 3.5–5.2)
Alkaline Phosphatase: 53 U/L (ref 39–117)
BUN: 13 mg/dL (ref 6–23)
CO2: 30 meq/L (ref 19–32)
Calcium: 8.8 mg/dL (ref 8.4–10.5)
Chloride: 101 meq/L (ref 96–112)
Creatinine, Ser: 1.12 mg/dL (ref 0.40–1.50)
GFR: 61.53 mL/min (ref >60.00)
Glucose, Bld: 101 mg/dL — ABNORMAL HIGH (ref 70–99)
Potassium: 3.8 meq/L (ref 3.5–5.1)
Sodium: 139 meq/L (ref 135–145)
Total Bilirubin: 0.5 mg/dL (ref 0.2–1.2)
Total Protein: 6.8 g/dL (ref 6.0–8.3)

## 2023-09-14 LAB — LIPID PANEL
Cholesterol: 131 mg/dL (ref 0–200)
HDL: 48.3 mg/dL (ref >39.00)
LDL Cholesterol: 62 mg/dL (ref 0–99)
NonHDL: 82.38
Total CHOL/HDL Ratio: 3
Triglycerides: 102 mg/dL (ref 0.0–149.0)
VLDL: 20.4 mg/dL (ref 0.0–40.0)

## 2023-09-14 MED ORDER — SIMVASTATIN 20 MG PO TABS: 20.0000 mg | ORAL_TABLET | Freq: Every day | ORAL | 3 refills | Status: AC

## 2023-09-14 NOTE — Progress Notes (Signed)
 Established Patient Office Visit  Subjective   Patient ID: Don Medina, male    DOB: 02-May-1941  Age: 83 y.o. MRN: [id number]  Chief Complaint  Patient presents with   Medical Management of Chronic Issues    HPI   Here for medical follow-up.  He is followed at the [facility] about once per year and states he was there last fall but we never got any copy of labs.  His chronic problems including history of atrial fibrillation, hypertension, GERD, history of aortic dissection with repair, hyperlipidemia.  His current medications include Coumadin , simvastatin , HCTZ, metoprolol , and Celexa .  Blood pressure has been very stable.  Rare dizziness.  No recent orthostasis symptoms.  No chest pains.  He exercises regularly.  Still does all of his yard work.  Did have a cold last Friday but symptoms improving already at this point.  No fever.  No dyspnea.  Past Medical History:  Diagnosis Date   ADJ DISORDER WITH MIXED ANXIETY & DEPRESSED MOOD 09/08/2009   Qualifier: Diagnosis of  By: Micheal MD, Mister Krahenbuhl     Arthritis    Atrial fibrillation (HCC) [I48.91] 05/23/2017   Blood in the stool    BRADYCARDIA 03/10/2010   Qualifier: Diagnosis of  By: Micheal MD, Taesha Goodell     CHEST PAIN 03/19/2010   Qualifier: Diagnosis of  By: York, LPN, Christine     Chicken pox    COLONIC POLYPS, HX OF 01/13/2009   Qualifier: Diagnosis of  By: Lavinia LPN, Nancy     Episode of generalized weakness 02/17/2016   Essential hypertension 03/15/2022   GERD (gastroesophageal reflux disease) 10/03/2012   Hay fever    History of colonic polyps    History of colonoscopy    Hyperlipidemia    MUSCLE STRAIN, HAMSTRING MUSCLE 09/08/2009   Qualifier: Diagnosis of  By: Micheal MD, Wolm DRAIN DIZZINESS 03/05/2010   Qualifier: Diagnosis of  By: Micheal MD, Aundray Cartlidge     Other premature beats 03/17/2010   Qualifier: Diagnosis of  By: Waddell, MD, CODY Danelle Elsie    Premature ventricular contraction 01/05/2011   S/P  aortic dissection repair 05/08/2017   THROMBOCYTOPENIA 03/05/2010   Qualifier: Diagnosis of  By: Micheal MD, Merton Wadlow     Weakness 12/09/2015   Past Surgical History:  Procedure Laterality Date   ABDOMINAL ADHESION SURGERY  2007   AORTIC INTERVENTION N/A 05/08/2017   Procedure: HYPOTHERMIC CIRCULATORY ARREST;  Surgeon: Army Dallas NOVAK, MD;  Location: [facility] OR;  Service: Open Heart Surgery;  Laterality: N/A;   ASCENDING AORTIC ROOT REPLACEMENT N/A 05/08/2017   Procedure: REPLACEMENT OF ASCENDING AORTIC ARCH;  Surgeon: Army Dallas NOVAK, MD;  Location: [facility] OR;  Service: Open Heart Surgery;  Laterality: N/A;   CANNULATION FOR CARDIOPULMONARY BYPASS Right 05/08/2017   Procedure: AXILLARY CANNULATION;  Surgeon: Army Dallas NOVAK, MD;  Location: [facility] OR;  Service: Open Heart Surgery;  Laterality: Right;   ENDOVASCULAR REPAIR/STENT GRAFT  05/08/2017   Procedure: STENT OF LEFT SUBCLAVIAN ARTERY with 10x39VBX;  Surgeon: Army Dallas NOVAK, MD;  Location: [facility] OR;  Service: Open Heart Surgery;;   herniated diks surgery L-5  1996   herniated disk surgery c 6-7  1986   INTRAOPERATIVE TRANSESOPHAGEAL ECHOCARDIOGRAM N/A 05/08/2017   Procedure: INTRAOPERATIVE TRANSESOPHAGEAL ECHOCARDIOGRAM;  Surgeon: Army Dallas NOVAK, MD;  Location: [facility] OR;  Service: Open Heart Surgery;  Laterality: N/A;   REPLACEMENT ASCENDING AORTA N/A 05/08/2017   Procedure: REPLACEMENT ASCENDING AORTA;  Surgeon: Army Dallas NOVAK,  MD;  Location: [facility] OR;  Service: Open Heart Surgery;  Laterality: N/A;   THORACIC AORTIC ENDOVASCULAR STENT GRAFT  05/08/2017   Procedure: STENT GRAFTING OF DESCENDING THORACIC AORTA;  Surgeon: Army Dallas NOVAK, MD;  Location: [facility] OR;  Service: Open Heart Surgery;;    reports that he quit smoking about 49 years ago. His smoking use included cigarettes. He has never used smokeless tobacco. He reports that he does not drink alcohol and does not use drugs. family history includes Alzheimer's disease (age of onset: 43) in  his father; Cancer in his brother; Dementia in his father; Hypertension in an other family member; Stroke (age of onset: 56) in his mother; Sudden death in an other family member. 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 Recent studies have raised concern that fluoroquinolone antibiotics could be associated with an increased risk of aortic aneurysm Fluoroquinolones have non-antimicrobial properties that might jeopardise the integrity of the extracellular matrix of the vascular wall In a  propensity score matched cohort study in [place], there was a 66% increased rate of aortic aneurysm or dissection associated with oral fluoroquinolone use, compared wit    Review of Systems  Constitutional:  Negative for malaise/fatigue.  Eyes:  Negative for blurred vision.  Respiratory:  Negative for shortness of breath.   Cardiovascular:  Negative for chest pain.  Neurological:  Negative for dizziness, weakness and headaches.      Objective:     BP 118/68 (BP Location: Left Arm, Cuff Size: Normal)   Pulse 62   Temp 97.7 F (36.5 C) (Oral)   Wt 177 lb 9.6 oz (80.6 kg)   SpO2 98%   BMI 27.00 kg/m  BP Readings from Last 3 Encounters:  09/14/23 118/68  08/16/23 116/60  06/22/23 124/66   Wt Readings from Last 3 Encounters:  09/14/23 177 lb 9.6 oz (80.6 kg)  08/16/23 175 lb (79.4 kg)  06/22/23 175 lb 3.2 oz (79.5 kg)      Physical Exam Vitals reviewed.  Constitutional:      Appearance: He is well-developed.  HENT:     Right Ear: External ear normal.     Left Ear: External ear normal.  Eyes:     Pupils: Pupils are equal, round, and reactive to light.  Neck:     Thyroid : No thyromegaly.  Cardiovascular:     Rate and Rhythm: Normal rate and regular rhythm.  Pulmonary:     Effort: Pulmonary effort is normal. No respiratory distress.     Breath  sounds: Normal breath sounds. No wheezing or rales.  Musculoskeletal:     Cervical back: Neck supple.     Right lower leg: No edema.     Left lower leg: No edema.  Neurological:     Mental Status: He is alert and oriented to person, place, and time.      No results found for any visits on 09/14/23.    The ASCVD Risk score (Arnett DK, et al., 2019) failed to calculate for the following reasons:   The 2019 ASCVD risk score is only valid for ages 37 to 62    Assessment & Plan:   #1 hypertension stable and well-controlled on low-dose HCTZ and low-dose metoprolol .  No recent orthostatic symptoms.  Continue current regimen.  Continue low-sodium diet.  Continue close monitoring at home  #2  hyperlipidemia treated with simvastatin  20 mg daily.  Recheck lipid and CMP today  #3 history of atrial fibrillation.  Patient on low-dose metoprolol  and Coumadin  and followed through Coumadin  clinic.  Immunizations are up-to-date and he has gotten these through his [facility] clinic   No follow-ups on file.    Wolm Scarlet, MD

## 2023-09-24 ENCOUNTER — Other Ambulatory Visit: Payer: Self-pay | Admitting: Cardiovascular Disease

## 2023-09-24 ENCOUNTER — Other Ambulatory Visit: Payer: Self-pay | Admitting: Family Medicine

## 2023-09-24 DIAGNOSIS — I480 Paroxysmal atrial fibrillation: Secondary | ICD-10-CM

## 2023-09-24 DIAGNOSIS — Z7901 Long term (current) use of anticoagulants: Secondary | ICD-10-CM

## 2023-09-26 ENCOUNTER — Ambulatory Visit: Payer: Medicare Other

## 2023-09-26 DIAGNOSIS — Z7901 Long term (current) use of anticoagulants: Secondary | ICD-10-CM

## 2023-09-26 LAB — POCT INR: INR: 2.5 (ref 2.0–3.0)

## 2023-09-26 NOTE — Patient Instructions (Addendum)
Pre visit review using our clinic review tool, if applicable. No additional management support is needed unless otherwise documented below in the visit note.  Continue 2 1/2 tablets daily except take 2 tablets on Monday and Thursday. Recheck in 6 weeks.

## 2023-09-26 NOTE — Telephone Encounter (Signed)
Pt is compliant with warfarin management and PCP apts.  Sent in refill of warfarin to requested pharmacy.    Sent in refill of citalopram also since it was under the same request.

## 2023-09-26 NOTE — Progress Notes (Signed)
Continue 2 1/2 tablets daily except take 2 tablets on Monday and Thursday. Recheck in 6 weeks.

## 2023-10-14 ENCOUNTER — Encounter: Payer: Medicare Other | Admitting: Gastroenterology

## 2023-11-02 ENCOUNTER — Encounter: Payer: Self-pay | Admitting: Family Medicine

## 2023-11-07 ENCOUNTER — Ambulatory Visit: Admitting: Family Medicine

## 2023-11-07 ENCOUNTER — Ambulatory Visit (INDEPENDENT_AMBULATORY_CARE_PROVIDER_SITE_OTHER)

## 2023-11-07 ENCOUNTER — Encounter: Payer: Self-pay | Admitting: Family Medicine

## 2023-11-07 ENCOUNTER — Ambulatory Visit: Payer: Medicare Other

## 2023-11-07 VITALS — BP 124/60 | HR 67 | Temp 97.9°F | Wt 176.8 lb

## 2023-11-07 DIAGNOSIS — R0781 Pleurodynia: Secondary | ICD-10-CM

## 2023-11-07 DIAGNOSIS — Z7901 Long term (current) use of anticoagulants: Secondary | ICD-10-CM

## 2023-11-07 LAB — POCT INR: INR: 1.5 — AB (ref 2.0–3.0)

## 2023-11-07 NOTE — Patient Instructions (Addendum)
 Pre visit review using our clinic review tool, if applicable. No additional management support is needed unless otherwise documented below in the visit note.  Increase dose today to take 3 tablets and increase dose tomorrow to take 3 1/2 tablets and then continue 2 1/2 tablets daily except take 2 tablets on Monday and Thursday. Recheck in 2 weeks.

## 2023-11-07 NOTE — Progress Notes (Addendum)
 Pt denies any changes. Pt also has apt with PCP today. Increase dose today to take 3 tablets and increase dose tomorrow to take 3 1/2 tablets and then continue 2 1/2 tablets daily except take 2 tablets on Monday and Thursday. Recheck in 2 weeks.

## 2023-11-07 NOTE — Progress Notes (Signed)
 Established Patient Office Visit  Subjective   Patient ID: Don Medina, male    DOB: 06/08/1941  Age: 83 y.o. MRN: [id number]  Chief Complaint  Patient presents with   Rib pain    Patient complains of left sided rib pain, x2 weeks     HPI   Mr. Don Medina has history of hypertension, atrial fibrillation, history of aortic dissection repair, hyperlipidemia.  Seen with 3 to 42-month history of left anterior rib pain.  Denies any specific injury.  He thinks the pain may be slowly getting better.  He recalls on this for started having pain that would awaken him up frequently at night.  Still has pain when leaning to the left side.  He notices that when he applies pressure directly over this area with his hand this seems to alleviate the pain.  No pleuritic pain.  No cough.  No dyspnea.  No fever.  Relatively mild severity.  Denies any recent appetite or weight changes.  Is on chronic Coumadin and followed through Coumadin clinic.  Recent INR 1.5.  Past Medical History:  Diagnosis Date   ADJ DISORDER WITH MIXED ANXIETY & DEPRESSED MOOD 09/08/2009   Qualifier: Diagnosis of  By: Caryl Never MD, Krist Rosenboom     Arthritis    Atrial fibrillation (HCC) [I48.91] 05/23/2017   Blood in the stool    BRADYCARDIA 03/10/2010   Qualifier: Diagnosis of  By: Caryl Never MD, Jenissa Tyrell     CHEST PAIN 03/19/2010   Qualifier: Diagnosis of  By: York, LPN, Christine     Chicken pox    COLONIC POLYPS, HX OF 01/13/2009   Qualifier: Diagnosis of  By: Gabriel Rung LPN, Nancy     Episode of generalized weakness 02/17/2016   Essential hypertension 03/15/2022   GERD (gastroesophageal reflux disease) 10/03/2012   Hay fever    History of colonic polyps    History of colonoscopy    Hyperlipidemia    MUSCLE STRAIN, HAMSTRING MUSCLE 09/08/2009   Qualifier: Diagnosis of  By: Caryl Never MD, Marisue Ivan DIZZINESS 03/05/2010   Qualifier: Diagnosis of  By: Caryl Never MD, Kora Groom     Other premature beats 03/17/2010   Qualifier:  Diagnosis of  By: Ladona Ridgel, MD, Jerrell Mylar    Premature ventricular contraction 01/05/2011   S/P aortic dissection repair 05/08/2017   THROMBOCYTOPENIA 03/05/2010   Qualifier: Diagnosis of  By: Caryl Never MD, Jaycen Vercher     Weakness 12/09/2015   Past Surgical History:  Procedure Laterality Date   ABDOMINAL ADHESION SURGERY  2007   AORTIC INTERVENTION N/A 05/08/2017   Procedure: HYPOTHERMIC CIRCULATORY ARREST;  Surgeon: Delight Ovens, MD;  Location: [facility] OR;  Service: Open Heart Surgery;  Laterality: N/A;   ASCENDING AORTIC ROOT REPLACEMENT N/A 05/08/2017   Procedure: REPLACEMENT OF ASCENDING AORTIC ARCH;  Surgeon: Delight Ovens, MD;  Location: [facility] OR;  Service: Open Heart Surgery;  Laterality: N/A;   CANNULATION FOR CARDIOPULMONARY BYPASS Right 05/08/2017   Procedure: AXILLARY CANNULATION;  Surgeon: Delight Ovens, MD;  Location: [facility] OR;  Service: Open Heart Surgery;  Laterality: Right;   ENDOVASCULAR REPAIR/STENT GRAFT  05/08/2017   Procedure: STENT OF LEFT SUBCLAVIAN ARTERY with 10x39VBX;  Surgeon: Delight Ovens, MD;  Location: [facility] OR;  Service: Open Heart Surgery;;   herniated diks surgery L-5  1996   herniated disk surgery c 6-7  1986   INTRAOPERATIVE TRANSESOPHAGEAL ECHOCARDIOGRAM N/A 05/08/2017   Procedure: INTRAOPERATIVE TRANSESOPHAGEAL ECHOCARDIOGRAM;  Surgeon: Delight Ovens, MD;  Location:  [facility] OR;  Service: Open Heart Surgery;  Laterality: N/A;   REPLACEMENT ASCENDING AORTA N/A 05/08/2017   Procedure: REPLACEMENT ASCENDING AORTA;  Surgeon: Delight Ovens, MD;  Location: [facility] OR;  Service: Open Heart Surgery;  Laterality: N/A;   THORACIC AORTIC ENDOVASCULAR STENT GRAFT  05/08/2017   Procedure: STENT GRAFTING OF DESCENDING THORACIC AORTA;  Surgeon: Delight Ovens, MD;  Location: [facility] OR;  Service: Open Heart Surgery;;    reports that he quit smoking about 49 years ago. His smoking use included cigarettes. He has never used smokeless tobacco. He reports that he does not  drink alcohol and does not use drugs. family history includes Alzheimer's disease (age of onset: 38) in his father; Cancer in his brother; Dementia in his father; Hypertension in an other family member; Stroke (age of onset: 26) in his mother; Sudden death in an other family member. 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 Recent studies have raised concern that fluoroquinolone antibiotics could be associated with an increased risk of aortic aneurysm Fluoroquinolones have non-antimicrobial properties that might jeopardise the integrity of the extracellular matrix of the vascular wall In a  propensity score matched cohort study in [place], there was a 66% increased rate of aortic aneurysm or dissection associated with oral fluoroquinolone use, compared wit    Review of Systems  Constitutional:  Negative for chills, fever and malaise/fatigue.  Eyes:  Negative for blurred vision.  Respiratory:  Negative for cough, hemoptysis, sputum production and shortness of breath.   Cardiovascular:  Negative for chest pain and leg swelling.  Neurological:  Negative for dizziness, weakness and headaches.      Objective:     BP 124/60 (BP Location: Left Arm, Patient Position: Sitting, Cuff Size: Normal)   Pulse 67   Temp 97.9 F (36.6 C) (Oral)   Wt 176 lb 12.8 oz (80.2 kg)   SpO2 95%   BMI 26.88 kg/m    Physical Exam Vitals reviewed.  Constitutional:      General: He is not in acute distress. Cardiovascular:     Rate and Rhythm: Normal rate and regular rhythm.     Comments: 2/6 systolic murmur right upper sternal border Pulmonary:     Effort: Pulmonary effort is normal.     Breath sounds: Normal breath sounds. No wheezing or rales.  Musculoskeletal:     Comments: Left anterior rib cage is nontender to palpation.  No visible swelling.  Neurological:      Mental Status: He is alert.      Results for orders placed or performed in visit on 11/07/23  POCT INR  Result Value Ref Range   INR 1.5 (A) 2.0 - 3.0      The ASCVD Risk score (Arnett DK, et al., 2019) failed to calculate for the following reasons:   The 2019 ASCVD risk score is only valid for ages 75 to 36    Assessment & Plan:   Problem List Items Addressed This Visit   None Visit Diagnoses       Rib pain on left side    -  Primary   Relevant Orders   DG Ribs Unilateral Left   DG Chest 2 View     Suspect musculoskeletal pain.  Present for 3 to 4 months.  Does not have any red flags such as fever,  cough, dyspnea, pleuritic pain.  Denies any specific injury.  Given duration of pain obtain left anterior rib films and chest x-ray.  If normal ,observe as well as is continuing to slowly improve  No follow-ups on file.    Evelena Peat, MD

## 2023-11-08 ENCOUNTER — Encounter: Payer: Self-pay | Admitting: Family Medicine

## 2023-11-08 NOTE — Telephone Encounter (Signed)
Called reading room

## 2023-11-14 ENCOUNTER — Telehealth: Payer: Self-pay

## 2023-11-14 ENCOUNTER — Encounter: Payer: Self-pay | Admitting: Family Medicine

## 2023-11-14 DIAGNOSIS — R0781 Pleurodynia: Secondary | ICD-10-CM

## 2023-11-14 NOTE — Telephone Encounter (Signed)
 Contacted pt and RS his coumadin clinic apt per pt request.

## 2023-11-14 NOTE — Telephone Encounter (Signed)
 Spoke with pt at some length.  Had some low back pain after lifting something heavy months ago but back pain improved with persistent left anterior rib pain.   He denies any CP, dyspnea, chronic cough, pleuritic pain, appetite change, weight change.  Exercising most days OK.   Pain not at rest but with movement.  Recent CXR and left ribs no acute finding.  CT angiogram chest per [facility] 10/24 unremarkable.  Sounds more musculoskeletal.   We did discuss possible sports medicine referral to Dr Ayesha Mohair to see if they have anything to offer.    Kristian Covey MD [facility]

## 2023-11-16 ENCOUNTER — Ambulatory Visit

## 2023-11-16 DIAGNOSIS — Z7901 Long term (current) use of anticoagulants: Secondary | ICD-10-CM | POA: Diagnosis not present

## 2023-11-16 LAB — POCT INR: INR: 1.9 — AB (ref 2.0–3.0)

## 2023-11-16 NOTE — Patient Instructions (Addendum)
 Pre visit review using our clinic review tool, if applicable. No additional management support is needed unless otherwise documented below in the visit note.  Increase dose today to take 3 tablets and then change weekly dose to take 2 1/2 tablets daily except take 2 tablets on Mondays. Recheck in 3 weeks.

## 2023-11-16 NOTE — Progress Notes (Signed)
 Increase dose today to take 3 tablets and then change weekly dose to take 2 1/2 tablets daily except take 2 tablets on Mondays. Recheck in 3 weeks.

## 2023-11-21 ENCOUNTER — Ambulatory Visit

## 2023-12-05 ENCOUNTER — Ambulatory Visit

## 2023-12-05 DIAGNOSIS — Z7901 Long term (current) use of anticoagulants: Secondary | ICD-10-CM

## 2023-12-05 LAB — POCT INR: INR: 2.7 (ref 2.0–3.0)

## 2023-12-05 NOTE — Patient Instructions (Addendum)
 Pre visit review using our clinic review tool, if applicable. No additional management support is needed unless otherwise documented below in the visit note.  Continue 2 1/2 tablets daily except take 2 tablets on Mondays. Recheck in  6 weeks.

## 2023-12-05 NOTE — Progress Notes (Signed)
 Continue 2 1/2 tablets daily except take 2 tablets on Mondays. Recheck in  6 weeks.

## 2023-12-18 ENCOUNTER — Other Ambulatory Visit: Payer: Self-pay | Admitting: Family Medicine

## 2023-12-18 NOTE — Telephone Encounter (Signed)
Dr.Burchette's pt 

## 2023-12-29 ENCOUNTER — Ambulatory Visit: Admitting: Family Medicine

## 2024-01-08 ENCOUNTER — Other Ambulatory Visit: Payer: Self-pay

## 2024-01-08 ENCOUNTER — Emergency Department (HOSPITAL_BASED_OUTPATIENT_CLINIC_OR_DEPARTMENT_OTHER): Admitting: Radiology

## 2024-01-08 ENCOUNTER — Emergency Department (HOSPITAL_BASED_OUTPATIENT_CLINIC_OR_DEPARTMENT_OTHER)

## 2024-01-08 ENCOUNTER — Observation Stay (HOSPITAL_BASED_OUTPATIENT_CLINIC_OR_DEPARTMENT_OTHER)
Admission: EM | Admit: 2024-01-08 | Discharge: 2024-01-09 | Disposition: A | Discharge status: Home / Self Care | Attending: Internal Medicine | Admitting: Internal Medicine

## 2024-01-08 DIAGNOSIS — S065X0A Traumatic subdural hemorrhage without loss of consciousness, initial encounter: Secondary | ICD-10-CM | POA: Diagnosis not present

## 2024-01-08 DIAGNOSIS — R519 Headache, unspecified: Secondary | ICD-10-CM | POA: Diagnosis present

## 2024-01-08 DIAGNOSIS — M26629 Arthralgia of temporomandibular joint, unspecified side: Secondary | ICD-10-CM | POA: Diagnosis not present

## 2024-01-08 DIAGNOSIS — R2681 Unsteadiness on feet: Secondary | ICD-10-CM | POA: Insufficient documentation

## 2024-01-08 DIAGNOSIS — Z95828 Presence of other vascular implants and grafts: Secondary | ICD-10-CM | POA: Diagnosis not present

## 2024-01-08 DIAGNOSIS — Z8679 Personal history of other diseases of the circulatory system: Secondary | ICD-10-CM | POA: Insufficient documentation

## 2024-01-08 DIAGNOSIS — I629 Nontraumatic intracranial hemorrhage, unspecified: Principal | ICD-10-CM

## 2024-01-08 DIAGNOSIS — I10 Essential (primary) hypertension: Secondary | ICD-10-CM | POA: Diagnosis not present

## 2024-01-08 DIAGNOSIS — Z87891 Personal history of nicotine dependence: Secondary | ICD-10-CM | POA: Diagnosis not present

## 2024-01-08 DIAGNOSIS — W19XXXA Unspecified fall, initial encounter: Principal | ICD-10-CM

## 2024-01-08 DIAGNOSIS — E119 Type 2 diabetes mellitus without complications: Secondary | ICD-10-CM | POA: Diagnosis not present

## 2024-01-08 DIAGNOSIS — Z743 Need for continuous supervision: Secondary | ICD-10-CM | POA: Diagnosis not present

## 2024-01-08 DIAGNOSIS — S81811A Laceration without foreign body, right lower leg, initial encounter: Secondary | ICD-10-CM | POA: Insufficient documentation

## 2024-01-08 DIAGNOSIS — R001 Bradycardia, unspecified: Secondary | ICD-10-CM | POA: Diagnosis not present

## 2024-01-08 DIAGNOSIS — D6832 Hemorrhagic disorder due to extrinsic circulating anticoagulants: Secondary | ICD-10-CM | POA: Diagnosis not present

## 2024-01-08 DIAGNOSIS — I4891 Unspecified atrial fibrillation: Secondary | ICD-10-CM | POA: Insufficient documentation

## 2024-01-08 DIAGNOSIS — Z7901 Long term (current) use of anticoagulants: Secondary | ICD-10-CM | POA: Insufficient documentation

## 2024-01-08 DIAGNOSIS — W11XXXA Fall on and from ladder, initial encounter: Secondary | ICD-10-CM | POA: Diagnosis not present

## 2024-01-08 DIAGNOSIS — S0181XA Laceration without foreign body of other part of head, initial encounter: Secondary | ICD-10-CM

## 2024-01-08 DIAGNOSIS — Z6826 Body mass index (BMI) 26.0-26.9, adult: Secondary | ICD-10-CM | POA: Insufficient documentation

## 2024-01-08 DIAGNOSIS — S01411A Laceration without foreign body of right cheek and temporomandibular area, initial encounter: Secondary | ICD-10-CM | POA: Diagnosis not present

## 2024-01-08 DIAGNOSIS — E785 Hyperlipidemia, unspecified: Secondary | ICD-10-CM | POA: Diagnosis not present

## 2024-01-08 DIAGNOSIS — S01511A Laceration without foreign body of lip, initial encounter: Secondary | ICD-10-CM | POA: Diagnosis not present

## 2024-01-08 DIAGNOSIS — D696 Thrombocytopenia, unspecified: Secondary | ICD-10-CM | POA: Insufficient documentation

## 2024-01-08 DIAGNOSIS — Z79899 Other long term (current) drug therapy: Secondary | ICD-10-CM | POA: Diagnosis not present

## 2024-01-08 DIAGNOSIS — S0003XA Contusion of scalp, initial encounter: Secondary | ICD-10-CM | POA: Insufficient documentation

## 2024-01-08 DIAGNOSIS — S81812A Laceration without foreign body, left lower leg, initial encounter: Secondary | ICD-10-CM | POA: Diagnosis not present

## 2024-01-08 LAB — CBC WITH DIFFERENTIAL/PLATELET
Abs Immature Granulocytes: 0.01 10*3/uL (ref 0.00–0.07)
Basophils Absolute: 0 10*3/uL (ref 0.0–0.1)
Basophils Relative: 1 %
Eosinophils Absolute: 0.1 10*3/uL (ref 0.0–0.5)
Eosinophils Relative: 1 %
HCT: 39.6 % (ref 39.0–52.0)
Hemoglobin: 14.2 g/dL (ref 13.0–17.0)
Immature Granulocytes: 0 %
Lymphocytes Relative: 45 %
Lymphs Abs: 2.8 10*3/uL (ref 0.7–4.0)
MCH: 32.1 pg (ref 26.0–34.0)
MCHC: 35.9 g/dL (ref 30.0–36.0)
MCV: 89.4 fL (ref 80.0–100.0)
Monocytes Absolute: 0.6 10*3/uL (ref 0.1–1.0)
Monocytes Relative: 9 %
Neutro Abs: 2.8 10*3/uL (ref 1.7–7.7)
Neutrophils Relative %: 44 %
Platelets: 122 10*3/uL — ABNORMAL LOW (ref 150–400)
RBC: 4.43 MIL/uL (ref 4.22–5.81)
RDW: 12.8 % (ref 11.5–15.5)
WBC: 6.4 10*3/uL (ref 4.0–10.5)
nRBC: 0 % (ref 0.0–0.2)

## 2024-01-08 LAB — BASIC METABOLIC PANEL WITH GFR
Anion gap: 11 (ref 5–15)
BUN: 18 mg/dL (ref 8–23)
CO2: 24 mmol/L (ref 22–32)
Calcium: 8.9 mg/dL (ref 8.9–10.3)
Chloride: 102 mmol/L (ref 98–111)
Creatinine, Ser: 1.13 mg/dL (ref 0.61–1.24)
GFR, Estimated: >60 mL/min (ref >60)
Glucose, Bld: 96 mg/dL (ref 70–99)
Potassium: 3.8 mmol/L (ref 3.5–5.1)
Sodium: 137 mmol/L (ref 135–145)

## 2024-01-08 LAB — PROTIME-INR
INR: 2.1 — ABNORMAL HIGH (ref 0.8–1.2)
INR: 1.9 — ABNORMAL HIGH (ref 0.8–1.2)
Prothrombin Time: 24 s — ABNORMAL HIGH (ref 11.4–15.2)
Prothrombin Time: 21.6 s — ABNORMAL HIGH (ref 11.4–15.2)

## 2024-01-08 LAB — HEMOGLOBIN A1C
Hgb A1c MFr Bld: 5.9 % — ABNORMAL HIGH (ref 4.8–5.6)
Mean Plasma Glucose: 122.63 mg/dL

## 2024-01-08 LAB — GLUCOSE, CAPILLARY: Glucose-Capillary: 153 mg/dL — ABNORMAL HIGH (ref 70–99)

## 2024-01-08 MED ORDER — LIDOCAINE-EPINEPHRINE-TETRACAINE (LET) TOPICAL GEL
3.0000 mL | Freq: Once | TOPICAL | Status: AC
  Administered 2024-01-08: 3 mL via TOPICAL
  Filled 2024-01-08: qty 3

## 2024-01-08 MED ORDER — HYDROCHLOROTHIAZIDE 12.5 MG PO TABS
12.5000 mg | ORAL_TABLET | Freq: Every day | ORAL | Status: DC
  Administered 2024-01-09: 12.5 mg via ORAL
  Filled 2024-01-08: qty 1

## 2024-01-08 MED ORDER — ACETAMINOPHEN 325 MG PO TABS
650.0000 mg | ORAL_TABLET | Freq: Four times a day (QID) | ORAL | Status: DC | PRN
  Administered 2024-01-08: 650 mg via ORAL
  Filled 2024-01-08: qty 2

## 2024-01-08 MED ORDER — FOLIC ACID 1 MG PO TABS
1.0000 mg | ORAL_TABLET | Freq: Every day | ORAL | Status: DC
  Administered 2024-01-09: 1 mg via ORAL
  Filled 2024-01-08: qty 1

## 2024-01-08 MED ORDER — SIMVASTATIN 20 MG PO TABS
20.0000 mg | ORAL_TABLET | Freq: Every day | ORAL | Status: DC
  Administered 2024-01-08: 20 mg via ORAL
  Filled 2024-01-08: qty 1

## 2024-01-08 MED ORDER — LIDOCAINE-EPINEPHRINE (PF) 2 %-1:200000 IJ SOLN
10.0000 mL | Freq: Once | INTRAMUSCULAR | Status: AC
  Administered 2024-01-08: 10 mL
  Filled 2024-01-08: qty 20

## 2024-01-08 MED ORDER — INSULIN ASPART 100 UNIT/ML IJ SOLN: 0.0000 [IU] | Freq: Three times a day (TID) | INTRAMUSCULAR | Status: DC

## 2024-01-08 MED ORDER — OMEGA-3-ACID ETHYL ESTERS 1 G PO CAPS
1.0000 g | ORAL_CAPSULE | Freq: Every day | ORAL | Status: DC
  Administered 2024-01-09: 1 g via ORAL
  Filled 2024-01-08: qty 1

## 2024-01-08 MED ORDER — INSULIN ASPART 100 UNIT/ML IJ SOLN: 0.0000 [IU] | Freq: Every day | INTRAMUSCULAR | Status: DC

## 2024-01-08 MED ORDER — ACETAMINOPHEN 650 MG RE SUPP: 650.0000 mg | Freq: Four times a day (QID) | RECTAL | Status: DC | PRN

## 2024-01-08 MED ORDER — CITALOPRAM HYDROBROMIDE 20 MG PO TABS
20.0000 mg | ORAL_TABLET | Freq: Every day | ORAL | Status: DC
  Administered 2024-01-08 – 2024-01-09 (×2): 20 mg via ORAL
  Filled 2024-01-08 (×2): qty 1

## 2024-01-08 MED ORDER — METOPROLOL TARTRATE 12.5 MG HALF TABLET
12.5000 mg | ORAL_TABLET | Freq: Two times a day (BID) | ORAL | Status: DC
  Administered 2024-01-09: 12.5 mg via ORAL
  Filled 2024-01-08: qty 1

## 2024-01-08 NOTE — ED Notes (Signed)
 Wounds:  3cmx2cm laceration to right cheek, red wound bed,adipose tissue visible. Cleansed/flushed with normal saline and LET applied.  1cm long laceration to left lower lip, red wound bed, flushed with normal saline, LET applied.  Large hematoma noted to left forehead/skull. Moderate hematoma noted to left posterior upper arm.  Right leg laceration (shin) 8cm.x 0.5 cm ,red wound bed.cleansed/flushed with normal saline, nonadherent dressing,vaseline gauze and mepitel dressing applied. Wife at bedside, demonstrated dressing change to her.  Left leg (shin) 4 cm x 3cm laceration, red wound bed, flushed with normal saline, dressed with nonadherent dressing, vaseline gauze and mepitel . Wife at bedside and demonstrated wound care/change dressing with her. Voiced understanding.

## 2024-01-08 NOTE — ED Triage Notes (Signed)
 Fell off ladder, approx 8 feet. Hit flower pot with face. Large lac to R side of face. Hematoma on forehead. Denies LOC. Takes warfarin. Denies neck pain.

## 2024-01-08 NOTE — Progress Notes (Signed)
 Patient ID: Don Medina, male   DOB: 1940/11/06, 83 y.o.   MRN: [id number] BP 132/73 (BP Location: Left Arm)   Pulse 62   Temp 98.4 F (36.9 C) (Oral)   Resp 17   Ht 5\' 8"  (1.727 m)   Wt 79.4 kg   SpO2 94%   BMI 26.61 kg/m  Head CT reviewed. No operative indications at all. Basal cisterns patent, ventricles not effaced, there is no mass effect. Film to be repeated in the AM. By report he has a normal exam, unchanged from his baseline. Will check film tomorrow unless neurological change prior to repeat scan.

## 2024-01-08 NOTE — ED Notes (Signed)
 Transport to CT

## 2024-01-08 NOTE — ED Notes (Signed)
 Don Medina from [facility] called, truck on the way

## 2024-01-08 NOTE — ED Provider Notes (Signed)
 [facility] AT [facility] Provider Note   CSN: [id number] Arrival date & time: 01/08/24  1241     History  Chief Complaint  Patient presents with   Don Medina    Don Medina is a 83 y.o. male.  Patient is a 83 year old male with a history of diabetes, hypertension, hyperlipidemia, atrial fibrillation on Coumadin  and aortic dissection status postrepair who presents with facial injuries after a fall.  He was up on a ladder.  His feet were about 6 feet in the air.  The ladder slid out and he slid down onto the ground.  He hit his head and cut his face on a plastic flowerpot.  He also says skinned his lower legs.  He said he did not hit his chest or abdomen anywhere.  He denies any neck or back pain.  He is on Coumadin .  He states his last tetanus shot was 3 years ago.       Home Medications Prior to Admission medications   Medication Sig Start Date End Date Taking? Authorizing Provider  citalopram  (CELEXA ) 20 MG tablet TAKE 1 TABLET BY MOUTH EVERY DAY 09/26/23   Burchette, Marijean Shouts, MD  folic acid (FOLVITE) 1 MG tablet Take 1 tablet by mouth daily. 04/12/06   [provider]  hydrochlorothiazide  (MICROZIDE ) 12.5 MG capsule TAKE 1 CAPSULE BY MOUTH EVERY DAY 12/19/23   Burchette, Marijean Shouts, MD  metoprolol  tartrate (LOPRESSOR ) 25 MG tablet TAKE 1/2 TABLET TWICE A DAY BY MOUTH 09/27/23   Nishan, Peter C, MD  Misc Natural Products (GLUCOS-CHONDROIT-MSM COMPLEX) TABS Take 1 tablet by mouth 3 (three) times daily.    [provider]  Omega-3 Fatty Acids (FISH OIL) 1000 MG CAPS Take 1 capsule by mouth daily.    [provider]  simvastatin  (ZOCOR ) 20 MG tablet Take 1 tablet (20 mg total) by mouth at bedtime. 09/14/23   Burchette, Marijean Shouts, MD  warfarin (COUMADIN ) 2.5 MG tablet TAKE 2 1/2 TABLETS BY MOUTH DAILY EXCEPT 2 TABLETS ON MONDAYS AND THURSDAYS OR AS DIRECTED BY ANTICOAGULATION CLINIC 09/26/23   Burchette, Marijean Shouts, MD      Allergies     Heparin  and Quinolones    Review of Systems   Review of Systems  Constitutional:  Negative for activity change, appetite change and fever.  HENT:  Negative for dental problem, nosebleeds and trouble swallowing.   Eyes:  Negative for pain and visual disturbance.  Respiratory:  Negative for shortness of breath.   Cardiovascular:  Negative for chest pain.  Gastrointestinal:  Negative for abdominal pain, nausea and vomiting.  Genitourinary:  Negative for dysuria and hematuria.  Musculoskeletal:  Positive for arthralgias. Negative for back pain, joint swelling and neck pain.  Skin:  Positive for wound.  Neurological:  Positive for headaches. Negative for weakness and numbness.  Psychiatric/Behavioral:  Negative for confusion.     Physical Exam Updated Vital Signs BP (!) 153/75   Pulse 60   Temp 98.7 F (37.1 C) (Oral)   Resp 18   SpO2 96%  Physical Exam Vitals reviewed.  Constitutional:      Appearance: He is well-developed.  HENT:     Head: Normocephalic.     Comments: Large hematoma with overlying abrasion to the mid forehead, there is a V-shaped laceration to his left cheek.  It does not appear to go through and through.  There is 1 cm laceration to the lower left lip that does cross the vermilion border.  There is superficial lacerations to the inner aspect of the lower lip.  Teeth appear intact.    Nose: Nose normal.  Eyes:     Conjunctiva/sclera: Conjunctivae normal.     Pupils: Pupils are equal, round, and reactive to light.  Neck:     Comments: No pain to the cervical, thoracic, or LS spine.  No step-offs or deformities noted Cardiovascular:     Rate and Rhythm: Normal rate and regular rhythm.     Heart sounds: No murmur heard.    Comments: No evidence of external trauma to the chest or abdomen Pulmonary:     Effort: Pulmonary effort is normal. No respiratory distress.     Breath sounds: Normal breath sounds. No wheezing.  Chest:     Chest wall: No tenderness.   Abdominal:     General: Bowel sounds are normal. There is no distension.     Palpations: Abdomen is soft.     Tenderness: There is no abdominal tenderness.  Musculoskeletal:        General: Normal range of motion.     Comments: No pain on palpation or ROM of the extremities.  Abrasions to both lower legs.  There is underlying large hematoma to the right pretibial area.  No bony deformity is noted.  Compartments are soft.  Skin:    General: Skin is warm and dry.     Capillary Refill: Capillary refill takes less than 2 seconds.  Neurological:     Mental Status: He is alert and oriented to person, place, and time.     ED Results / Procedures / Treatments   Labs (all labs ordered are listed, but only abnormal results are displayed) Labs Reviewed  PROTIME-INR - Abnormal; Notable for the following components:      Result Value   Prothrombin Time 24.0 (*)    INR 2.1 (*)    All other components within normal limits  CBC WITH DIFFERENTIAL/PLATELET - Abnormal; Notable for the following components:   Platelets 122 (*)    All other components within normal limits  BASIC METABOLIC PANEL WITH GFR    EKG None  Radiology CT Head Wo Contrast Addendum Date: 01/08/2024 ADDENDUM REPORT: 01/08/2024 13:56 ADDENDUM: Please note that the x-ray right tibia fibula 01/08/2024 was mistakenly attached the CT head, max face, C-spine. The findings of the CT head were discussed as below: These results were called by telephone at the time of interpretation on 01/08/2024 at 1:53 pm to provider Dr. Arno Lapidus, who verbally acknowledged these results. --------------------------------------------------------------------------- -------------------- Please find read of the x-ray right tibia fibula below: CLINICAL DATA: Fall. RIGHT TIBIA AND FIBULA - 3 VIEW COMPARISON: None. FINDINGS: No evidence of fracture, dislocation, or joint effusion. No evidence of severe arthropathy. No aggressive appearing focal bone abnormality.  Ankle and knee grossly unremarkable. Vascular calcifications. Soft tissues are unremarkable. IMPRESSION: No acute displaced fracture or dislocation. These results were called by telephone at the time of interpretation on 01/08/2024 at 1:53 pm to provider Dr. Arno Lapidus, who verbally acknowledged these results. Electronically Signed   By: Morgane  Naveau M.D.   On: 01/08/2024 13:56   Result Date: 01/08/2024 CLINICAL DATA:  Head trauma, minor (Age >= 65y) on coumadin ; fall, leg pain; Facial trauma, blunt; Neck trauma (Age >= 65y) EXAM: CT HEAD WITHOUT CONTRAST CT MAXILLOFACIAL WITHOUT CONTRAST CT CERVICAL SPINE WITHOUT CONTRAST TECHNIQUE: Multidetector CT imaging of the head, cervical spine, and maxillofacial structures were performed using the standard protocol without intravenous contrast. Multiplanar CT image  reconstructions of the cervical spine and maxillofacial structures were also generated. RADIATION DOSE REDUCTION: This exam was performed according to the departmental dose-optimization program which includes automated exposure control, adjustment of the mA and/or kV according to patient size and/or use of iterative reconstruction technique. COMPARISON:  CT head 12/04/2015 FINDINGS: CT HEAD FINDINGS Brain: Trace patchy and confluent areas of decreased attenuation are noted throughout the deep and periventricular white matter of the cerebral hemispheres bilaterally, compatible with chronic microvascular ischemic disease. No evidence of large-territorial acute infarction. No parenchymal hemorrhage. No mass lesion. A 4 mm hyperdense foci left of the falx cerebri (1:18, 4:55). No mass effect or midline shift. No hydrocephalus. Basilar cisterns are patent. Vascular: No hyperdense vessel. Skull: No acute fracture or focal lesion. Other: 7 mm midline frontal scalp hematoma. CT MAXILLOFACIAL FINDINGS Osseous: No fracture or mandibular dislocation. No destructive process. Sinuses/Orbits: Complete opacification of left  maxillary sinus. Otherwise paranasal sinuses and mastoid air cells are clear. Bilateral lens replacement. Otherwise the orbits are unremarkable. Soft tissues: Negative. CT CERVICAL SPINE FINDINGS Alignment: Grade 1 anterolisthesis of C2 on C3 and C3 on C4. Skull base and vertebrae: Multilevel moderate to severe degenerative changes of the spine. 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 No severe osseous central canal stenosis. No acute fracture. No aggressive appearing focal osseous lesion or focal pathologic process. Soft tissues and spinal canal: No prevertebral fluid or swelling. No visible canal hematoma. Upper chest: Unremarkable. Other: None. IMPRESSION: 1. A 4 mm hyperdense foci left of the falx cerebri. Finding could represent a trace developing subdural or subarachnoid hemorrhage. 2. No acute displaced facial fracture. 3. No acute displaced fracture or traumatic listhesis of the cervical spine. 4. Multilevel moderate to severe degenerative changes of the spine. 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 Electronically Signed: By: Morgane  Naveau M.D. On: 01/08/2024 13:42   CT Cervical Spine Wo Contrast Addendum Date: 01/08/2024 ADDENDUM REPORT: 01/08/2024 13:56 ADDENDUM: Please note that the x-ray right tibia fibula 01/08/2024 was mistakenly attached the CT head, max face, C-spine. The findings of the CT head were discussed as below: These results were called by telephone at the time of interpretation on 01/08/2024 at 1:53 pm to provider Dr. Arno Lapidus, who verbally acknowledged these results. --------------------------------------------------------------------------- -------------------- Please find read of the x-ray right tibia fibula below: CLINICAL DATA: Fall. RIGHT TIBIA AND FIBULA - 3 VIEW COMPARISON: None. FINDINGS: No evidence of fracture, dislocation, or joint effusion. No evidence of severe arthropathy. No aggressive appearing focal bone abnormality. Ankle  and knee grossly unremarkable. Vascular calcifications. Soft tissues are unremarkable. IMPRESSION: No acute displaced fracture or dislocation. These results were called by telephone at the time of interpretation on 01/08/2024 at 1:53 pm to provider Dr. Arno Lapidus, who verbally acknowledged these results. Electronically Signed   By: Morgane  Naveau M.D.   On: 01/08/2024 13:56   Result Date: 01/08/2024 CLINICAL DATA:  Head trauma, minor (Age >= 65y) on coumadin ; fall, leg pain; Facial trauma, blunt; Neck trauma (Age >= 65y) EXAM: CT HEAD WITHOUT CONTRAST CT MAXILLOFACIAL WITHOUT CONTRAST CT CERVICAL SPINE WITHOUT CONTRAST TECHNIQUE: Multidetector CT imaging of the head, cervical spine, and maxillofacial structures were performed using the standard protocol without intravenous contrast. Multiplanar CT image reconstructions of the cervical spine and maxillofacial structures were also generated. RADIATION DOSE REDUCTION: This exam was performed according to the departmental dose-optimization program which includes automated exposure control, adjustment of the mA and/or kV according to patient size and/or use of iterative reconstruction technique. COMPARISON:  CT head 12/04/2015 FINDINGS: CT HEAD FINDINGS Brain: Trace patchy and confluent areas of decreased attenuation are noted throughout the deep and periventricular white matter of the cerebral hemispheres bilaterally, compatible with chronic microvascular ischemic disease. No evidence of large-territorial acute infarction. No parenchymal hemorrhage. No mass lesion. A 4 mm hyperdense foci left of the falx cerebri (1:18, 4:55). No mass effect or midline shift. No hydrocephalus. Basilar cisterns are patent. Vascular: No hyperdense vessel. Skull: No acute fracture or focal lesion. Other: 7 mm midline frontal scalp hematoma. CT MAXILLOFACIAL FINDINGS Osseous: No fracture or mandibular dislocation. No destructive process. Sinuses/Orbits: Complete opacification of left maxillary  sinus. Otherwise paranasal sinuses and mastoid air cells are clear. Bilateral lens replacement. Otherwise the orbits are unremarkable. Soft tissues: Negative. CT CERVICAL SPINE FINDINGS Alignment: Grade 1 anterolisthesis of C2 on C3 and C3 on C4. Skull base and vertebrae: Multilevel moderate to severe degenerative changes of the spine. 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 No severe osseous central canal stenosis. No acute fracture. No aggressive appearing focal osseous lesion or focal pathologic process. Soft tissues and spinal canal: No prevertebral fluid or swelling. No visible canal hematoma. Upper chest: Unremarkable. Other: None. IMPRESSION: 1. A 4 mm hyperdense foci left of the falx cerebri. Finding could represent a trace developing subdural or subarachnoid hemorrhage. 2. No acute displaced facial fracture. 3. No acute displaced fracture or traumatic listhesis of the cervical spine. 4. Multilevel moderate to severe degenerative changes of the spine. 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 Electronically Signed: By: Morgane  Naveau M.D. On: 01/08/2024 13:42   CT Maxillofacial Wo Contrast Addendum Date: 01/08/2024 ADDENDUM REPORT: 01/08/2024 13:56 ADDENDUM: Please note that the x-ray right tibia fibula 01/08/2024 was mistakenly attached the CT head, max face, C-spine. The findings of the CT head were discussed as below: These results were called by telephone at the time of interpretation on 01/08/2024 at 1:53 pm to provider Dr. Arno Lapidus, who verbally acknowledged these results. --------------------------------------------------------------------------- -------------------- Please find read of the x-ray right tibia fibula below: CLINICAL DATA: Fall. RIGHT TIBIA AND FIBULA - 3 VIEW COMPARISON: None. FINDINGS: No evidence of fracture, dislocation, or joint effusion. No evidence of severe arthropathy. No aggressive appearing focal bone abnormality. Ankle and knee  grossly unremarkable. Vascular calcifications. Soft tissues are unremarkable. IMPRESSION: No acute displaced fracture or dislocation. These results were called by telephone at the time of interpretation on 01/08/2024 at 1:53 pm to provider Dr. Arno Lapidus, who verbally acknowledged these results. Electronically Signed   By: Morgane  Naveau M.D.   On: 01/08/2024 13:56   Result Date: 01/08/2024 CLINICAL DATA:  Head trauma, minor (Age >= 65y) on coumadin ; fall, leg pain; Facial trauma, blunt; Neck trauma (Age >= 65y) EXAM: CT HEAD WITHOUT CONTRAST CT MAXILLOFACIAL WITHOUT CONTRAST CT CERVICAL SPINE WITHOUT CONTRAST TECHNIQUE: Multidetector CT imaging of the head, cervical spine, and maxillofacial structures were performed using the standard protocol without intravenous contrast. Multiplanar CT image reconstructions of the cervical spine and maxillofacial structures were also generated. RADIATION DOSE REDUCTION: This exam was performed according to the departmental dose-optimization program which includes automated exposure control, adjustment of the mA and/or kV according to patient size and/or use of iterative reconstruction technique. COMPARISON:  CT head 12/04/2015 FINDINGS: CT HEAD FINDINGS Brain: Trace patchy and confluent areas of decreased attenuation are noted throughout the deep and periventricular white matter of the cerebral hemispheres bilaterally, compatible with chronic microvascular ischemic disease. No evidence of large-territorial acute infarction. No parenchymal hemorrhage. No mass lesion.  A 4 mm hyperdense foci left of the falx cerebri (1:18, 4:55). No mass effect or midline shift. No hydrocephalus. Basilar cisterns are patent. Vascular: No hyperdense vessel. Skull: No acute fracture or focal lesion. Other: 7 mm midline frontal scalp hematoma. CT MAXILLOFACIAL FINDINGS Osseous: No fracture or mandibular dislocation. No destructive process. Sinuses/Orbits: Complete opacification of left maxillary sinus.  Otherwise paranasal sinuses and mastoid air cells are clear. Bilateral lens replacement. Otherwise the orbits are unremarkable. Soft tissues: Negative. CT CERVICAL SPINE FINDINGS Alignment: Grade 1 anterolisthesis of C2 on C3 and C3 on C4. Skull base and vertebrae: Multilevel moderate to severe degenerative changes of the spine. 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 No severe osseous central canal stenosis. No acute fracture. No aggressive appearing focal osseous lesion or focal pathologic process. Soft tissues and spinal canal: No prevertebral fluid or swelling. No visible canal hematoma. Upper chest: Unremarkable. Other: None. IMPRESSION: 1. A 4 mm hyperdense foci left of the falx cerebri. Finding could represent a trace developing subdural or subarachnoid hemorrhage. 2. No acute displaced facial fracture. 3. No acute displaced fracture or traumatic listhesis of the cervical spine. 4. Multilevel moderate to severe degenerative changes of the spine. 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 Electronically Signed: By: Morgane  Naveau M.D. On: 01/08/2024 13:42   DG Tibia/Fibula Right Addendum Date: 01/08/2024 ADDENDUM REPORT: 01/08/2024 13:56 ADDENDUM: Please note that the x-ray right tibia fibula 01/08/2024 was mistakenly attached the CT head, max face, C-spine. The findings of the CT head were discussed as below: These results were called by telephone at the time of interpretation on 01/08/2024 at 1:53 pm to provider Dr. Arno Lapidus, who verbally acknowledged these results. --------------------------------------------------------------------------- -------------------- Please find read of the x-ray right tibia fibula below: CLINICAL DATA: Fall. RIGHT TIBIA AND FIBULA - 3 VIEW COMPARISON: None. FINDINGS: No evidence of fracture, dislocation, or joint effusion. No evidence of severe arthropathy. No aggressive appearing focal bone abnormality. Ankle and knee grossly  unremarkable. Vascular calcifications. Soft tissues are unremarkable. IMPRESSION: No acute displaced fracture or dislocation. These results were called by telephone at the time of interpretation on 01/08/2024 at 1:53 pm to provider Dr. Arno Lapidus, who verbally acknowledged these results. Electronically Signed   By: Morgane  Naveau M.D.   On: 01/08/2024 13:56   Result Date: 01/08/2024 CLINICAL DATA:  Head trauma, minor (Age >= 65y) on coumadin ; fall, leg pain; Facial trauma, blunt; Neck trauma (Age >= 65y) EXAM: CT HEAD WITHOUT CONTRAST CT MAXILLOFACIAL WITHOUT CONTRAST CT CERVICAL SPINE WITHOUT CONTRAST TECHNIQUE: Multidetector CT imaging of the head, cervical spine, and maxillofacial structures were performed using the standard protocol without intravenous contrast. Multiplanar CT image reconstructions of the cervical spine and maxillofacial structures were also generated. RADIATION DOSE REDUCTION: This exam was performed according to the departmental dose-optimization program which includes automated exposure control, adjustment of the mA and/or kV according to patient size and/or use of iterative reconstruction technique. COMPARISON:  CT head 12/04/2015 FINDINGS: CT HEAD FINDINGS Brain: Trace patchy and confluent areas of decreased attenuation are noted throughout the deep and periventricular white matter of the cerebral hemispheres bilaterally, compatible with chronic microvascular ischemic disease. No evidence of large-territorial acute infarction. No parenchymal hemorrhage. No mass lesion. A 4 mm hyperdense foci left of the falx cerebri (1:18, 4:55). No mass effect or midline shift. No hydrocephalus. Basilar cisterns are patent. Vascular: No hyperdense vessel. Skull: No acute fracture or focal lesion. Other: 7 mm midline frontal scalp hematoma. CT MAXILLOFACIAL FINDINGS Osseous: No fracture or  mandibular dislocation. No destructive process. Sinuses/Orbits: Complete opacification of left maxillary sinus. Otherwise  paranasal sinuses and mastoid air cells are clear. Bilateral lens replacement. Otherwise the orbits are unremarkable. Soft tissues: Negative. CT CERVICAL SPINE FINDINGS Alignment: Grade 1 anterolisthesis of C2 on C3 and C3 on C4. Skull base and vertebrae: Multilevel moderate to severe degenerative changes of the spine. 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 No severe osseous central canal stenosis. No acute fracture. No aggressive appearing focal osseous lesion or focal pathologic process. Soft tissues and spinal canal: No prevertebral fluid or swelling. No visible canal hematoma. Upper chest: Unremarkable. Other: None. IMPRESSION: 1. A 4 mm hyperdense foci left of the falx cerebri. Finding could represent a trace developing subdural or subarachnoid hemorrhage. 2. No acute displaced facial fracture. 3. No acute displaced fracture or traumatic listhesis of the cervical spine. 4. Multilevel moderate to severe degenerative changes of the spine. 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 Electronically Signed: By: Morgane  Naveau M.D. On: 01/08/2024 13:42    Procedures .Laceration Repair  Date/Time: 01/08/2024 2:33 PM  Performed by: Hershel Los, MD Authorized by: Hershel Los, MD   Consent:    Consent obtained:  Verbal   Consent given by:  Patient   Risks, benefits, and alternatives were discussed: yes     Risks discussed:  Poor cosmetic result, vascular damage, poor wound healing, infection and nerve damage   Alternatives discussed:  No treatment Anesthesia:    Anesthesia method:  Topical application and local infiltration   Topical anesthetic:  LET   Local anesthetic:  Lidocaine  2% WITH epi Laceration details:    Location:  Face   Face location:  R cheek   Length (cm):  5 Pre-procedure details:    Preparation:  Patient was prepped and draped in usual sterile fashion and imaging obtained to evaluate for foreign bodies Exploration:    Hemostasis  achieved with:  LET and epinephrine    Imaging outcome: foreign body not noted     Wound exploration: entire depth of wound visualized     Wound extent: fascia not violated, no foreign body, no signs of injury, no nerve damage, no underlying fracture and no vascular damage     Contaminated: no   Treatment:    Area cleansed with:  Saline   Amount of cleaning:  Standard   Irrigation solution:  Sterile saline   Irrigation method:  Syringe   Visualized foreign bodies/material removed: no   Skin repair:    Repair method:  Sutures   Suture size:  6-0   Suture material:  Prolene   Suture technique:  Simple interrupted   Number of sutures:  10 Approximation:    Approximation:  Close Repair type:    Repair type:  Simple Post-procedure details:    Dressing:  Open (no dressing)   Procedure completion:  Tolerated well, no immediate complications .Laceration Repair  Date/Time: 01/08/2024 2:34 PM  Performed by: Hershel Los, MD Authorized by: Hershel Los, MD   Consent:    Consent obtained:  Verbal   Consent given by:  Patient   Risks discussed:  Infection, poor cosmetic result, pain and retained foreign body Anesthesia:    Anesthesia method:  Local infiltration   Local anesthetic:  Lidocaine  2% WITH epi Laceration details:    Location:  Lip   Length (cm):  1 Pre-procedure details:    Preparation:  Imaging obtained to evaluate for foreign bodies Exploration:    Wound exploration: entire depth of wound visualized  Wound extent: no foreign body, no signs of injury and no vascular damage     Contaminated: no   Treatment:    Area cleansed with:  Saline   Amount of cleaning:  Standard   Irrigation solution:  Sterile saline   Irrigation method:  Syringe   Visualized foreign bodies/material removed: no   Skin repair:    Repair method:  Sutures   Suture size:  6-0   Suture material:  Prolene   Suture technique:  Simple interrupted   Number of sutures:  3 Approximation:     Approximation:  Close   Vermilion border well-aligned: yes   Repair type:    Repair type:  Simple Post-procedure details:    Dressing:  Open (no dressing)   Procedure completion:  Tolerated well, no immediate complications     Medications Ordered in ED Medications  lidocaine -EPINEPHrine -tetracaine (LET) topical gel (3 mLs Topical Given 01/08/24 1336)  lidocaine -EPINEPHrine  (XYLOCAINE  W/EPI) 2 %-1:200000 (PF) injection 10 mL (10 mLs Infiltration Given 01/08/24 1336)    ED Course/ Medical Decision Making/ A&P                                 Medical Decision Making Amount and/or Complexity of Data Reviewed Labs: ordered. Radiology: ordered.  Risk Prescription drug management. Decision regarding hospitalization.    This patient presents to the ED for concern of fall on blood thinners, this involves an extensive number of treatment options, and is a complaint that carries with it a high risk of complications and morbidity.  I considered the following differential and admission for this acute, potentially life threatening condition.  The differential diagnosis includes intracranial hemorrhage, facial fractures, lacerations, extremity fracture, compartment syndrome, nerve damage, spinal injury.  MDM:    Patient is a 83 year old male on Coumadin  who presents after mechanical fall.  He slid down a ladder and struck his head on the ground.  He also has abrasions to his lower legs and facial lacerations.  The lacerations were repaired.  He had x-rays of his right leg that did not show any fracture.  CT scan of the head, maxillofacial bones and cervical spine did not show any acute abnormalities other than there is a small area of hemorrhage in the left falx.  He does not have any apparent injuries to his chest or abdomen.  I do not see any external trauma on the chest or abdomen.  He denies hitting his chest or abdomen on anything during the slide down the ladder.  His tetanus shot is up-to-date.   Discussed with Burdette Carolin with neurosurgery.  She recommends overnight observation.  She recommends holding Coumadin  for now as well.  I do not see an indication at this point for Coumadin  reversal.  Discussed with Dr. Ramiro Burly with trauma surgery who feels that since it is an isolated injury, medicine can admit.  Discussed with Dr. Job Mulch who will admit the patient for further treatment.  (Labs, imaging, consults)  Labs: I Ordered, and personally interpreted labs.  The pertinent results include: INR is 2.1  Imaging Studies ordered: I ordered imaging studies including CT scan of the head, maxillofacial bones and cervical spine x-rays of the right tib-fib I independently visualized and interpreted imaging. I agree with the radiologist interpretation  Additional history obtained from chart.  External records from outside source obtained and reviewed including need for Coumadin , other cardiology notes    Reevaluation: After the interventions  noted above, I reevaluated the patient and found that they have :improved  Social Determinants of Health:  none  Disposition: Admission  Co morbidities that complicate the patient evaluation  CRITICAL CARE Performed by: Hershel Los Total critical care time: 60 minutes Critical care time was exclusive of separately billable procedures and treating other patients. Critical care was necessary to treat or prevent imminent or life-threatening deterioration. Critical care was time spent personally by me on the following activities: development of treatment plan with patient and/or surrogate as well as nursing, discussions with consultants, evaluation of patient's response to treatment, examination of patient, obtaining history from patient or surrogate, ordering and performing treatments and interventions, ordering and review of laboratory studies, ordering and review of radiographic studies, pulse oximetry and re-evaluation of patient's condition.   Past  Medical History:  Diagnosis Date   ADJ DISORDER WITH MIXED ANXIETY & DEPRESSED MOOD 09/08/2009   Qualifier: Diagnosis of  By: Darren Em MD, Bruce     Arthritis    Atrial fibrillation (HCC) [I48.91] 05/23/2017   Blood in the stool    BRADYCARDIA 03/10/2010   Qualifier: Diagnosis of  By: Darren Em MD, Bruce     CHEST PAIN 03/19/2010   Qualifier: Diagnosis of  By: Isabella Mao, LPN, Christine     Chicken pox    COLONIC POLYPS, HX OF 01/13/2009   Qualifier: Diagnosis of  By: Ivy Marseilles LPN, Nancy     Episode of generalized weakness 02/17/2016   Essential hypertension 03/15/2022   GERD (gastroesophageal reflux disease) 10/03/2012   Hay fever    History of colonic polyps    History of colonoscopy    Hyperlipidemia    MUSCLE STRAIN, HAMSTRING MUSCLE 09/08/2009   Qualifier: Diagnosis of  By: Darren Em MD, Carman Chimera DIZZINESS 03/05/2010   Qualifier: Diagnosis of  By: Darren Em MD, Bruce     Other premature beats 03/17/2010   Qualifier: Diagnosis of  By: Carolynne Citron, MD, Norva Beecham    Premature ventricular contraction 01/05/2011   S/P aortic dissection repair 05/08/2017   THROMBOCYTOPENIA 03/05/2010   Qualifier: Diagnosis of  By: Darren Em MD, Bruce     Weakness 12/09/2015     Medicines Meds ordered this encounter  Medications   lidocaine -EPINEPHrine -tetracaine (LET) topical gel   lidocaine -EPINEPHrine  (XYLOCAINE  W/EPI) 2 %-1:200000 (PF) injection 10 mL    I have reviewed the patients home medicines and have made adjustments as needed  Problem List / ED Course: Problem List Items Addressed This Visit   None Visit Diagnoses       Fall, initial encounter    -  Primary     Intracranial hemorrhage (HCC)         Facial laceration, initial encounter                 Final Clinical Impression(s) / ED Diagnoses Final diagnoses:  Fall, initial encounter  Intracranial hemorrhage (HCC)  Facial laceration, initial encounter    Rx / DC Orders ED Discharge Orders     None          Hershel Los, MD 01/08/24 1513

## 2024-01-08 NOTE — Hospital Course (Addendum)
 83 y/o M on DM HTN, HLD, aortic dissection s/p repair history, A fib on coumadin  presenting with fall from ladder. As per the report he was up on ladder about 6 ft in the air then ladder slid out and he slid down onto the ground hitting his head with cut on face from a plastic flowerpot. No loss of consciousness double vision. Patient otherwise denies any nausea, vomiting, chest pain, shortness of breath, fever, chills, headache, focal weakness, numbness tingling, speech difficulties. Patient laceration has been repaired and no complaint.   In ED- vitals stable, labs platelets 122, stable cbc , bmp,  INR 2.1.  CT showed  tiny ?ICH: neurosurgery Arlis Bent)  consulted and recommended holding coumadin  tonight and repeat CT head in AM.Trauma surgery said that patient could be admitted to [facility] service. Laceration was repaired in ED.

## 2024-01-08 NOTE — H&P (Signed)
 History and Physical    Don Medina WUJ:[id number] DOB: [birth year] DOA: 01/08/2024  PCP: Marquetta Sit, MD   Patient coming from: Home Chief Complaint  Patient presents with   [facility] y/o M on DM HTN, HLD, aortic dissection s/p repair history, A fib on coumadin  presenting with fall from ladder. As per the report he was up on ladder about 6 ft in the air then ladder slid out and he slid down onto the ground hitting his head with cut on face from a plastic flowerpot. No LOC. Patient otherwise denies any nausea, vomiting, chest pain, shortness of breath, fever, chills, headache, focal weakness, numbness tingling, speech difficulties   in ED- vitals stable, labs platelets 122, stable cbc , bmp,  INR 2.1.  CT showed  tiny ?ICH: neurosurgery Arlis Bent)  consulted and recommended holding coumadin  tonight and repeat CT head in AM.Trauma surgery said that patient could be admitted to [facility] service. Laceration was repaired in ED.   Assessment/Plan Principal Problem:   Intracranial bleed (HCC)  Fall at home mechanical 4 mm hypodense focus on the left of the falx cerebri likely subdural or SAH Facial laceration: Neurosurgery Arlis Bent)  consulted by EDP and recommended holding coumadin  tonight and repeat CT head in AM. Placed on neurochecks, telemetry.  Patient currently denies any neurological symptoms numbness limiting double vision focal weakness or headache. Continue wound care of the facial laceration overnight appropriate follow-up to address wound  Hypertension: BP stable resume home HCTZ and metoprolol   Hyperlipidemia: Continue home statin  Atrial fibrillation History of aortic dissection repair: Patient on long-term Coumadin  which will be held.  Check INR in the morning surgery recommendation about Coumadin  resumption await timing.  Mild thrombocytopenia: Monitor platelet  Body mass index is 26.61 kg/m.   Severity of Illness: The appropriate patient  status for this patient is OBSERVATION. Observation status is judged to be reasonable and necessary in order to provide the required intensity of service to ensure the patient's safety. The patient's presenting symptoms, physical exam findings, and initial radiographic and laboratory data in the context of their medical condition is felt to place them at decreased risk for further clinical deterioration. Furthermore, it is anticipated that the patient will be medically stable for discharge from the hospital within 2 midnights of admission.    DVT prophylaxis: SCDs Start: 01/08/24 1806  Code Status:   Code Status: Full Code but "does not want to be on life support for prolonged period" Family Communication: Admission, patients condition and plan of care including tests being ordered have been discussed with the patient and wife who indicate understanding and agree with the plan and Code Status.  Consults called:  Neurosurgery and Trauma from EDP  Review of Systems: All systems were reviewed and were negative except as mentioned in HPI above. Negative for fever Negative for chest pain Negative for shortness of breath  Past Medical History:  Diagnosis Date   ADJ DISORDER WITH MIXED ANXIETY & DEPRESSED MOOD 09/08/2009   Qualifier: Diagnosis of  By: Darren Em MD, Bruce     Arthritis    Atrial fibrillation (HCC) [I48.91] 05/23/2017   Blood in the stool    BRADYCARDIA 03/10/2010   Qualifier: Diagnosis of  By: Darren Em MD, Bruce     CHEST PAIN 03/19/2010   Qualifier: Diagnosis of  By: York, LPN, Christine     Chicken pox    COLONIC POLYPS, HX OF 01/13/2009   Qualifier: Diagnosis of  ByIvy Marseilles LPN,  Nancy     Episode of generalized weakness 02/17/2016   Essential hypertension 03/15/2022   GERD (gastroesophageal reflux disease) 10/03/2012   Hay fever    History of colonic polyps    History of colonoscopy    Hyperlipidemia    MUSCLE STRAIN, HAMSTRING MUSCLE 09/08/2009   Qualifier: Diagnosis of  By:  Darren Em MD, Carman Chimera DIZZINESS 03/05/2010   Qualifier: Diagnosis of  By: Darren Em MD, Bruce     Other premature beats 03/17/2010   Qualifier: Diagnosis of  By: Carolynne Citron, MD, Norva Beecham    Premature ventricular contraction 01/05/2011   S/P aortic dissection repair 05/08/2017   THROMBOCYTOPENIA 03/05/2010   Qualifier: Diagnosis of  By: Darren Em MD, Bruce     Weakness 12/09/2015    Past Surgical History:  Procedure Laterality Date   ABDOMINAL ADHESION SURGERY  2007   AORTIC INTERVENTION N/A 05/08/2017   Procedure: HYPOTHERMIC CIRCULATORY ARREST;  Surgeon: Norita Beauvais, MD;  Location: [facility] OR;  Service: Open Heart Surgery;  Laterality: N/A;   ASCENDING AORTIC ROOT REPLACEMENT N/A 05/08/2017   Procedure: REPLACEMENT OF ASCENDING AORTIC ARCH;  Surgeon: Norita Beauvais, MD;  Location: [facility] OR;  Service: Open Heart Surgery;  Laterality: N/A;   CANNULATION FOR CARDIOPULMONARY BYPASS Right 05/08/2017   Procedure: AXILLARY CANNULATION;  Surgeon: Norita Beauvais, MD;  Location: [facility] OR;  Service: Open Heart Surgery;  Laterality: Right;   ENDOVASCULAR REPAIR/STENT GRAFT  05/08/2017   Procedure: STENT OF LEFT SUBCLAVIAN ARTERY with [id number];  Surgeon: Norita Beauvais, MD;  Location: [facility] OR;  Service: Open Heart Surgery;;   herniated diks surgery L-5  1996   herniated disk surgery c 6-7  1986   INTRAOPERATIVE TRANSESOPHAGEAL ECHOCARDIOGRAM N/A 05/08/2017   Procedure: INTRAOPERATIVE TRANSESOPHAGEAL ECHOCARDIOGRAM;  Surgeon: Norita Beauvais, MD;  Location: [facility] OR;  Service: Open Heart Surgery;  Laterality: N/A;   REPLACEMENT ASCENDING AORTA N/A 05/08/2017   Procedure: REPLACEMENT ASCENDING AORTA;  Surgeon: Norita Beauvais, MD;  Location: [facility] OR;  Service: Open Heart Surgery;  Laterality: N/A;   THORACIC AORTIC ENDOVASCULAR STENT GRAFT  05/08/2017   Procedure: STENT GRAFTING OF DESCENDING THORACIC AORTA;  Surgeon: Norita Beauvais, MD;  Location: [facility] OR;  Service: Open Heart  Surgery;;     reports that he quit smoking about 49 years ago. His smoking use included cigarettes. He has never used smokeless tobacco. He reports that he does not drink alcohol and does not use drugs.  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 Recent studies have raised concern that fluoroquinolone antibiotics could be associated with an increased risk of aortic aneurysm Fluoroquinolones have non-antimicrobial properties that might jeopardise the integrity of the extracellular matrix of the vascular wall In a  propensity score matched cohort study in [place], there was a 66% increased rate of aortic aneurysm or dissection associated with oral fluoroquinolone use, compared wit    Family History  Problem Relation Age of Onset   Alzheimer's disease Father 13   Dementia Father    Stroke Mother 41   Cancer Brother        Leukemia   Sudden death Other        family   Hypertension Other    Heart disease Neg Hx      Prior to Admission medications  Medication Sig Start Date End Date Taking? Authorizing Provider  citalopram  (CELEXA ) 20 MG tablet TAKE 1 TABLET BY MOUTH EVERY DAY 09/26/23   Burchette, Marijean Shouts, MD  folic acid (FOLVITE) 1 MG tablet Take 1 tablet by mouth daily. 04/12/06   [provider]  hydrochlorothiazide  (MICROZIDE ) 12.5 MG capsule TAKE 1 CAPSULE BY MOUTH EVERY DAY 12/19/23   Burchette, Marijean Shouts, MD  metoprolol  tartrate (LOPRESSOR ) 25 MG tablet TAKE 1/2 TABLET TWICE A DAY BY MOUTH 09/27/23   Nishan, Peter C, MD  Misc Natural Products (GLUCOS-CHONDROIT-MSM COMPLEX) TABS Take 1 tablet by mouth 3 (three) times daily.    [provider]  Omega-3 Fatty Acids (FISH OIL) 1000 MG CAPS Take 1 capsule by mouth daily.    [provider]  simvastatin  (ZOCOR ) 20 MG tablet Take 1 tablet (20 mg total) by mouth at bedtime.  09/14/23   Burchette, Marijean Shouts, MD  warfarin (COUMADIN ) 2.5 MG tablet TAKE 2 1/2 TABLETS BY MOUTH DAILY EXCEPT 2 TABLETS ON MONDAYS AND THURSDAYS OR AS DIRECTED BY ANTICOAGULATION CLINIC 09/26/23   Marquetta Sit, MD    Physical Exam: Vitals:   01/08/24 1615 01/08/24 1630 01/08/24 1638 01/08/24 1728  BP: (!) 141/59 (!) 149/60  (!) 159/61  Pulse: (!) 58 60  (!) 56  Resp: 16 17  13   Temp:    97.8 F (36.6 C)  TempSrc:      SpO2: 95% 97%  99%  Weight:   79.4 kg   Height:   5\' 8"  (1.727 m)     General exam: AAO X3, NAD, weak appearing. HEENT:Oral mucosa moist, Ear/Nose WNL grossly, dentition normal. Respiratory system: bilaterally CLEAR,no wheezing or crackles,no use of accessory muscle Cardiovascular system: S1 & S2 +, No JVD,. Gastrointestinal system: Abdomen soft, NT,ND, BS+ Nervous System:Alert, awake, moving extremities and grossly nonfocal Extremities: No edema, distal peripheral pulses palpable.  Skin: No rashes,no icterus. Facial laceration repaired. MSK: Normal muscle bulk,tone, power   Labs on Admission: I have personally reviewed following labs and imaging studies  CBC: Recent Labs  Lab 01/08/24 1435  WBC 6.4  NEUTROABS 2.8  HGB 14.2  HCT 39.6  MCV 89.4  PLT 122*   Basic Metabolic Panel: Recent Labs  Lab 01/08/24 1314  NA 137  K 3.8  CL 102  CO2 24  GLUCOSE 96  BUN 18  CREATININE 1.13  CALCIUM  8.9   Recent Labs  Lab 01/08/24 1314  INR 2.1*   No results for input(s): "TSH", "T4TOTAL", "FREET4", "T3FREE", "THYROIDAB" in the last 72 hours. Urine analysis:    Component Value Date/Time   COLORURINE YELLOW 12/04/2015 1823   APPEARANCEUR Clear 06/01/2017 1446   LABSPEC 1.020 12/04/2015 1823   PHURINE 7.0 12/04/2015 1823   GLUCOSEU Negative 06/01/2017 1446   HGBUR NEGATIVE 12/04/2015 1823   HGBUR negative 09/25/2009 1105   BILIRUBINUR Negative 06/01/2017 1446   KETONESUR NEGATIVE 12/04/2015 1823   PROTEINUR Negative 06/01/2017 1446   PROTEINUR  NEGATIVE 12/04/2015 1823   UROBILINOGEN 0.2 05/27/2017 1508   UROBILINOGEN 0.2 09/25/2009 1105   NITRITE Negative 06/01/2017 1446   NITRITE NEGATIVE 12/04/2015 1823   LEUKOCYTESUR Negative 06/01/2017 1446    Radiological Exams on Admission: CT Head Wo Contrast Addendum Date: 01/08/2024 ADDENDUM REPORT: 01/08/2024 13:56 ADDENDUM: Please note that the x-ray right tibia fibula 01/08/2024 was mistakenly attached the CT head, max face, C-spine. The findings of the CT head were discussed as below: These results were called by telephone at the time  of interpretation on 01/08/2024 at 1:53 pm to provider Dr. Arno Lapidus, who verbally acknowledged these results. --------------------------------------------------------------------------- -------------------- Please find read of the x-ray right tibia fibula below: CLINICAL DATA: Fall. RIGHT TIBIA AND FIBULA - 3 VIEW COMPARISON: None. FINDINGS: No evidence of fracture, dislocation, or joint effusion. No evidence of severe arthropathy. No aggressive appearing focal bone abnormality. Ankle and knee grossly unremarkable. Vascular calcifications. Soft tissues are unremarkable. IMPRESSION: No acute displaced fracture or dislocation. These results were called by telephone at the time of interpretation on 01/08/2024 at 1:53 pm to provider Dr. Arno Lapidus, who verbally acknowledged these results. Electronically Signed   By: Morgane  Naveau M.D.   On: 01/08/2024 13:56   Result Date: 01/08/2024 CLINICAL DATA:  Head trauma, minor (Age >= 65y) on coumadin ; fall, leg pain; Facial trauma, blunt; Neck trauma (Age >= 65y) EXAM: CT HEAD WITHOUT CONTRAST CT MAXILLOFACIAL WITHOUT CONTRAST CT CERVICAL SPINE WITHOUT CONTRAST TECHNIQUE: Multidetector CT imaging of the head, cervical spine, and maxillofacial structures were performed using the standard protocol without intravenous contrast. Multiplanar CT image reconstructions of the cervical spine and maxillofacial structures were also generated.  RADIATION DOSE REDUCTION: This exam was performed according to the departmental dose-optimization program which includes automated exposure control, adjustment of the mA and/or kV according to patient size and/or use of iterative reconstruction technique. COMPARISON:  CT head 12/04/2015 FINDINGS: CT HEAD FINDINGS Brain: Trace patchy and confluent areas of decreased attenuation are noted throughout the deep and periventricular white matter of the cerebral hemispheres bilaterally, compatible with chronic microvascular ischemic disease. No evidence of large-territorial acute infarction. No parenchymal hemorrhage. No mass lesion. A 4 mm hyperdense foci left of the falx cerebri (1:18, 4:55). No mass effect or midline shift. No hydrocephalus. Basilar cisterns are patent. Vascular: No hyperdense vessel. Skull: No acute fracture or focal lesion. Other: 7 mm midline frontal scalp hematoma. CT MAXILLOFACIAL FINDINGS Osseous: No fracture or mandibular dislocation. No destructive process. Sinuses/Orbits: Complete opacification of left maxillary sinus. Otherwise paranasal sinuses and mastoid air cells are clear. Bilateral lens replacement. Otherwise the orbits are unremarkable. Soft tissues: Negative. CT CERVICAL SPINE FINDINGS Alignment: Grade 1 anterolisthesis of C2 on C3 and C3 on C4. Skull base and vertebrae: Multilevel moderate to severe degenerative changes of the spine. 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 No severe osseous central canal stenosis. No acute fracture. No aggressive appearing focal osseous lesion or focal pathologic process. Soft tissues and spinal canal: No prevertebral fluid or swelling. No visible canal hematoma. Upper chest: Unremarkable. Other: None. IMPRESSION: 1. A 4 mm hyperdense foci left of the falx cerebri. Finding could represent a trace developing subdural or subarachnoid hemorrhage. 2. No acute displaced facial fracture. 3. No acute displaced fracture or traumatic  listhesis of the cervical spine. 4. Multilevel moderate to severe degenerative changes of the spine. 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 Electronically Signed: By: Morgane  Naveau M.D. On: 01/08/2024 13:42   CT Cervical Spine Wo Contrast Addendum Date: 01/08/2024 ADDENDUM REPORT: 01/08/2024 13:56 ADDENDUM: Please note that the x-ray right tibia fibula 01/08/2024 was mistakenly attached the CT head, max face, C-spine. The findings of the CT head were discussed as below: These results were called by telephone at the time of interpretation on 01/08/2024 at 1:53 pm to provider Dr. Arno Lapidus, who verbally acknowledged these results. --------------------------------------------------------------------------- -------------------- Please find read of the x-ray right tibia fibula below: CLINICAL DATA: Fall. RIGHT TIBIA AND FIBULA - 3 VIEW COMPARISON: None. FINDINGS: No evidence of fracture, dislocation, or  joint effusion. No evidence of severe arthropathy. No aggressive appearing focal bone abnormality. Ankle and knee grossly unremarkable. Vascular calcifications. Soft tissues are unremarkable. IMPRESSION: No acute displaced fracture or dislocation. These results were called by telephone at the time of interpretation on 01/08/2024 at 1:53 pm to provider Dr. Arno Lapidus, who verbally acknowledged these results. Electronically Signed   By: Morgane  Naveau M.D.   On: 01/08/2024 13:56   Result Date: 01/08/2024 CLINICAL DATA:  Head trauma, minor (Age >= 65y) on coumadin ; fall, leg pain; Facial trauma, blunt; Neck trauma (Age >= 65y) EXAM: CT HEAD WITHOUT CONTRAST CT MAXILLOFACIAL WITHOUT CONTRAST CT CERVICAL SPINE WITHOUT CONTRAST TECHNIQUE: Multidetector CT imaging of the head, cervical spine, and maxillofacial structures were performed using the standard protocol without intravenous contrast. Multiplanar CT image reconstructions of the cervical spine and maxillofacial structures were also generated.  RADIATION DOSE REDUCTION: This exam was performed according to the departmental dose-optimization program which includes automated exposure control, adjustment of the mA and/or kV according to patient size and/or use of iterative reconstruction technique. COMPARISON:  CT head 12/04/2015 FINDINGS: CT HEAD FINDINGS Brain: Trace patchy and confluent areas of decreased attenuation are noted throughout the deep and periventricular white matter of the cerebral hemispheres bilaterally, compatible with chronic microvascular ischemic disease. No evidence of large-territorial acute infarction. No parenchymal hemorrhage. No mass lesion. A 4 mm hyperdense foci left of the falx cerebri (1:18, 4:55). No mass effect or midline shift. No hydrocephalus. Basilar cisterns are patent. Vascular: No hyperdense vessel. Skull: No acute fracture or focal lesion. Other: 7 mm midline frontal scalp hematoma. CT MAXILLOFACIAL FINDINGS Osseous: No fracture or mandibular dislocation. No destructive process. Sinuses/Orbits: Complete opacification of left maxillary sinus. Otherwise paranasal sinuses and mastoid air cells are clear. Bilateral lens replacement. Otherwise the orbits are unremarkable. Soft tissues: Negative. CT CERVICAL SPINE FINDINGS Alignment: Grade 1 anterolisthesis of C2 on C3 and C3 on C4. Skull base and vertebrae: Multilevel moderate to severe degenerative changes of the spine. 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 No severe osseous central canal stenosis. No acute fracture. No aggressive appearing focal osseous lesion or focal pathologic process. Soft tissues and spinal canal: No prevertebral fluid or swelling. No visible canal hematoma. Upper chest: Unremarkable. Other: None. IMPRESSION: 1. A 4 mm hyperdense foci left of the falx cerebri. Finding could represent a trace developing subdural or subarachnoid hemorrhage. 2. No acute displaced facial fracture. 3. No acute displaced fracture or traumatic  listhesis of the cervical spine. 4. Multilevel moderate to severe degenerative changes of the spine. 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 Electronically Signed: By: Morgane  Naveau M.D. On: 01/08/2024 13:42   CT Maxillofacial Wo Contrast Addendum Date: 01/08/2024 ADDENDUM REPORT: 01/08/2024 13:56 ADDENDUM: Please note that the x-ray right tibia fibula 01/08/2024 was mistakenly attached the CT head, max face, C-spine. The findings of the CT head were discussed as below: These results were called by telephone at the time of interpretation on 01/08/2024 at 1:53 pm to provider Dr. Arno Lapidus, who verbally acknowledged these results. --------------------------------------------------------------------------- -------------------- Please find read of the x-ray right tibia fibula below: CLINICAL DATA: Fall. RIGHT TIBIA AND FIBULA - 3 VIEW COMPARISON: None. FINDINGS: No evidence of fracture, dislocation, or joint effusion. No evidence of severe arthropathy. No aggressive appearing focal bone abnormality. Ankle and knee grossly unremarkable. Vascular calcifications. Soft tissues are unremarkable. IMPRESSION: No acute displaced fracture or dislocation. These results were called by telephone at the time of interpretation on 01/08/2024 at 1:53 pm to  provider Dr. Arno Lapidus, who verbally acknowledged these results. Electronically Signed   By: Morgane  Naveau M.D.   On: 01/08/2024 13:56   Result Date: 01/08/2024 CLINICAL DATA:  Head trauma, minor (Age >= 65y) on coumadin ; fall, leg pain; Facial trauma, blunt; Neck trauma (Age >= 65y) EXAM: CT HEAD WITHOUT CONTRAST CT MAXILLOFACIAL WITHOUT CONTRAST CT CERVICAL SPINE WITHOUT CONTRAST TECHNIQUE: Multidetector CT imaging of the head, cervical spine, and maxillofacial structures were performed using the standard protocol without intravenous contrast. Multiplanar CT image reconstructions of the cervical spine and maxillofacial structures were also generated.  RADIATION DOSE REDUCTION: This exam was performed according to the departmental dose-optimization program which includes automated exposure control, adjustment of the mA and/or kV according to patient size and/or use of iterative reconstruction technique. COMPARISON:  CT head 12/04/2015 FINDINGS: CT HEAD FINDINGS Brain: Trace patchy and confluent areas of decreased attenuation are noted throughout the deep and periventricular white matter of the cerebral hemispheres bilaterally, compatible with chronic microvascular ischemic disease. No evidence of large-territorial acute infarction. No parenchymal hemorrhage. No mass lesion. A 4 mm hyperdense foci left of the falx cerebri (1:18, 4:55). No mass effect or midline shift. No hydrocephalus. Basilar cisterns are patent. Vascular: No hyperdense vessel. Skull: No acute fracture or focal lesion. Other: 7 mm midline frontal scalp hematoma. CT MAXILLOFACIAL FINDINGS Osseous: No fracture or mandibular dislocation. No destructive process. Sinuses/Orbits: Complete opacification of left maxillary sinus. Otherwise paranasal sinuses and mastoid air cells are clear. Bilateral lens replacement. Otherwise the orbits are unremarkable. Soft tissues: Negative. CT CERVICAL SPINE FINDINGS Alignment: Grade 1 anterolisthesis of C2 on C3 and C3 on C4. Skull base and vertebrae: Multilevel moderate to severe degenerative changes of the spine. 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 No severe osseous central canal stenosis. No acute fracture. No aggressive appearing focal osseous lesion or focal pathologic process. Soft tissues and spinal canal: No prevertebral fluid or swelling. No visible canal hematoma. Upper chest: Unremarkable. Other: None. IMPRESSION: 1. A 4 mm hyperdense foci left of the falx cerebri. Finding could represent a trace developing subdural or subarachnoid hemorrhage. 2. No acute displaced facial fracture. 3. No acute displaced fracture or traumatic  listhesis of the cervical spine. 4. Multilevel moderate to severe degenerative changes of the spine. 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 Electronically Signed: By: Morgane  Naveau M.D. On: 01/08/2024 13:42   DG Tibia/Fibula Right Addendum Date: 01/08/2024 ADDENDUM REPORT: 01/08/2024 13:56 ADDENDUM: Please note that the x-ray right tibia fibula 01/08/2024 was mistakenly attached the CT head, max face, C-spine. The findings of the CT head were discussed as below: These results were called by telephone at the time of interpretation on 01/08/2024 at 1:53 pm to provider Dr. Arno Lapidus, who verbally acknowledged these results. --------------------------------------------------------------------------- -------------------- Please find read of the x-ray right tibia fibula below: CLINICAL DATA: Fall. RIGHT TIBIA AND FIBULA - 3 VIEW COMPARISON: None. FINDINGS: No evidence of fracture, dislocation, or joint effusion. No evidence of severe arthropathy. No aggressive appearing focal bone abnormality. Ankle and knee grossly unremarkable. Vascular calcifications. Soft tissues are unremarkable. IMPRESSION: No acute displaced fracture or dislocation. These results were called by telephone at the time of interpretation on 01/08/2024 at 1:53 pm to provider Dr. Arno Lapidus, who verbally acknowledged these results. Electronically Signed   By: Morgane  Naveau M.D.   On: 01/08/2024 13:56   Result Date: 01/08/2024 CLINICAL DATA:  Head trauma, minor (Age >= 65y) on coumadin ; fall, leg pain; Facial trauma, blunt; Neck trauma (Age >= 65y)  EXAM: CT HEAD WITHOUT CONTRAST CT MAXILLOFACIAL WITHOUT CONTRAST CT CERVICAL SPINE WITHOUT CONTRAST TECHNIQUE: Multidetector CT imaging of the head, cervical spine, and maxillofacial structures were performed using the standard protocol without intravenous contrast. Multiplanar CT image reconstructions of the cervical spine and maxillofacial structures were also generated. RADIATION  DOSE REDUCTION: This exam was performed according to the departmental dose-optimization program which includes automated exposure control, adjustment of the mA and/or kV according to patient size and/or use of iterative reconstruction technique. COMPARISON:  CT head 12/04/2015 FINDINGS: CT HEAD FINDINGS Brain: Trace patchy and confluent areas of decreased attenuation are noted throughout the deep and periventricular white matter of the cerebral hemispheres bilaterally, compatible with chronic microvascular ischemic disease. No evidence of large-territorial acute infarction. No parenchymal hemorrhage. No mass lesion. A 4 mm hyperdense foci left of the falx cerebri (1:18, 4:55). No mass effect or midline shift. No hydrocephalus. Basilar cisterns are patent. Vascular: No hyperdense vessel. Skull: No acute fracture or focal lesion. Other: 7 mm midline frontal scalp hematoma. CT MAXILLOFACIAL FINDINGS Osseous: No fracture or mandibular dislocation. No destructive process. Sinuses/Orbits: Complete opacification of left maxillary sinus. Otherwise paranasal sinuses and mastoid air cells are clear. Bilateral lens replacement. Otherwise the orbits are unremarkable. Soft tissues: Negative. CT CERVICAL SPINE FINDINGS Alignment: Grade 1 anterolisthesis of C2 on C3 and C3 on C4. Skull base and vertebrae: Multilevel moderate to severe degenerative changes of the spine. 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 No severe osseous central canal stenosis. No acute fracture. No aggressive appearing focal osseous lesion or focal pathologic process. Soft tissues and spinal canal: No prevertebral fluid or swelling. No visible canal hematoma. Upper chest: Unremarkable. Other: None. IMPRESSION: 1. A 4 mm hyperdense foci left of the falx cerebri. Finding could represent a trace developing subdural or subarachnoid hemorrhage. 2. No acute displaced facial fracture. 3. No acute displaced fracture or traumatic listhesis of  the cervical spine. 4. Multilevel moderate to severe degenerative changes of the spine. 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 Electronically Signed: By: Morgane  Naveau M.D. On: 01/08/2024 13:42   Lesa Rape MD [facility]  If 7PM-7AM, please contact night-coverage [email or web address]  01/08/2024, 6:07 PM

## 2024-01-09 ENCOUNTER — Observation Stay (HOSPITAL_COMMUNITY)

## 2024-01-09 ENCOUNTER — Other Ambulatory Visit (HOSPITAL_COMMUNITY): Payer: Self-pay

## 2024-01-09 DIAGNOSIS — I629 Nontraumatic intracranial hemorrhage, unspecified: Secondary | ICD-10-CM | POA: Diagnosis not present

## 2024-01-09 LAB — BASIC METABOLIC PANEL WITH GFR
Anion gap: 7 (ref 5–15)
BUN: 16 mg/dL (ref 8–23)
CO2: 27 mmol/L (ref 22–32)
Calcium: 8.6 mg/dL — ABNORMAL LOW (ref 8.9–10.3)
Chloride: 102 mmol/L (ref 98–111)
Creatinine, Ser: 1.1 mg/dL (ref 0.61–1.24)
GFR, Estimated: >60 mL/min (ref >60)
Glucose, Bld: 111 mg/dL — ABNORMAL HIGH (ref 70–99)
Potassium: 3.5 mmol/L (ref 3.5–5.1)
Sodium: 136 mmol/L (ref 135–145)

## 2024-01-09 LAB — CBC
HCT: 37.3 % — ABNORMAL LOW (ref 39.0–52.0)
Hemoglobin: 13.4 g/dL (ref 13.0–17.0)
MCH: 32.1 pg (ref 26.0–34.0)
MCHC: 35.9 g/dL (ref 30.0–36.0)
MCV: 89.2 fL (ref 80.0–100.0)
Platelets: 113 10*3/uL — ABNORMAL LOW (ref 150–400)
RBC: 4.18 MIL/uL — ABNORMAL LOW (ref 4.22–5.81)
RDW: 12.8 % (ref 11.5–15.5)
WBC: 9.2 10*3/uL (ref 4.0–10.5)
nRBC: 0 % (ref 0.0–0.2)

## 2024-01-09 LAB — PROTIME-INR
INR: 1.8 — ABNORMAL HIGH (ref 0.8–1.2)
Prothrombin Time: 20.9 s — ABNORMAL HIGH (ref 11.4–15.2)

## 2024-01-09 MED ORDER — CEPHALEXIN 500 MG PO CAPS
500.0000 mg | ORAL_CAPSULE | Freq: Three times a day (TID) | ORAL | Status: DC
  Administered 2024-01-09: 500 mg via ORAL
  Filled 2024-01-09: qty 1

## 2024-01-09 MED ORDER — CEPHALEXIN 500 MG PO CAPS
500.0000 mg | ORAL_CAPSULE | Freq: Three times a day (TID) | ORAL | 0 refills | Status: AC
  Filled 2024-01-09: qty 15, 5d supply, fill #0

## 2024-01-09 MED ORDER — VITAMIN K1 10 MG/ML IJ SOLN
5.0000 mg | Freq: Once | INTRAVENOUS | Status: AC
  Administered 2024-01-09: 5 mg via INTRAVENOUS
  Filled 2024-01-09: qty 0.5

## 2024-01-09 NOTE — Discharge Instructions (Signed)
 Follow with Primary MD Marquetta Sit, MD in 7 days   Get CBC, CMP,  checked  by Primary MD next visit.    Activity: As tolerated with Full fall precautions use walker/cane & assistance as needed   Disposition Home    Diet: Heart Healthy   On your next visit with your primary care physician please Get Medicines reviewed and adjusted.   Please request your Prim.MD to go over all Hospital Tests and Procedure/Radiological results at the follow up, please get all Hospital records sent to your Prim MD by signing hospital release before you go home.   If you experience worsening of your admission symptoms, develop shortness of breath, life threatening emergency, suicidal or homicidal thoughts you must seek medical attention immediately by calling 911 or calling your MD immediately  if symptoms less severe.  You Must read complete instructions/literature along with all the possible adverse reactions/side effects for all the Medicines you take and that have been prescribed to you. Take any new Medicines after you have completely understood and accpet all the possible adverse reactions/side effects.   Do not drive, operating heavy machinery, perform activities at heights, swimming or participation in water activities or provide baby sitting services if your were admitted for syncope or siezures until you have seen by Primary MD or a Neurologist and advised to do so again.  Do not drive when taking Pain medications.    Do not take more than prescribed Pain, Sleep and Anxiety Medications  Special Instructions: If you have smoked or chewed Tobacco  in the last 2 yrs please stop smoking, stop any regular Alcohol  and or any Recreational drug use.  Wear Seat belts while driving.   Please note  You were cared for by a hospitalist during your hospital stay. If you have any questions about your discharge medications or the care you received while you were in the hospital after you are  discharged, you can call the unit and asked to speak with the hospitalist on call if the hospitalist that took care of you is not available. Once you are discharged, your primary care physician will handle any further medical issues. Please note that NO REFILLS for any discharge medications will be authorized once you are discharged, as it is imperative that you return to your primary care physician (or establish a relationship with a primary care physician if you do not have one) for your aftercare needs so that they can reassess your need for medications and monitor your lab values.

## 2024-01-09 NOTE — Discharge Summary (Signed)
 Physician Discharge Summary  Don Medina GNF:[id number] DOB: 1940-11-19 DOA: 01/08/2024  PCP: Marquetta Sit, MD  Admit date: 01/08/2024 Discharge date: 01/09/2024  Admitted From: (Home) Disposition:  (Home )  Recommendations for Outpatient Follow-up:  Follow up with PCP in 7-10 days for sutures removal Patient instructed to follow-up with his primary cardiologist to see if he still needs resume warfarin after 2 weeks, if there is a strong indication for it, but low threshold to to keep holding it indefinitely given his intracranial bleed, and main indication was lone A-fib few years ago.  Diet recommendation: Carb Modified  Brief/Interim Summary: 83 y/o M on DM HTN, HLD, aortic dissection s/p repair history, A fib on coumadin  presenting with fall from ladder. As per the report he was up on ladder about 6 ft in the air then ladder slid out and he slid down onto the ground hitting his head with cut on face from a plastic flowerpot. No LOC. Patient otherwise denies any nausea, vomiting, chest pain, shortness of breath, fever, chills, headache, focal weakness, numbness tingling, speech difficulties    in ED- vitals stable, labs platelets 122, stable cbc , bmp,  INR 2.1.  CT showed  tiny ?ICH: neurosurgery Arlis Bent)  consulted and recommended holding coumadin  tonight and repeat CT head in AM.Trauma surgery said that patient could be admitted to [facility] service. Laceration was repaired in ED.    Fall at home mechanical 4 mm hypodense focus on the left of the falx cerebri likely subdural or SAH Facial laceration: Neurosurgery Arlis Bent)  consulted by EDP and recommended holding coumadin  tonight and repeat CT head in AM.,he was Placed on neurochecks, telemetry.  Patient was monitored closely with no neurological symptoms numbness limiting double vision focal weakness or headache.  Pete CT head this morning showing stable trace left cingulate region subarachnoid blood, but new trace of  subdural blood layering on the right tentorium, overall as discussed with neurosurgery he stable for discharge, and recommendation to hold warfarin indefinitely if possible. -Will discharge on Keflex x 5 days given his lacerations. - He was instructed to follow-up with his PCP in 7 to 10 days for suture removals   Hypertension: BP stable resume home HCTZ and metoprolol    Hyperlipidemia: Continue home statin   Atrial fibrillation History of aortic dissection repair: Coagulopathy due to warfarin Please see above discussion regarding warfarin. INR is 1.8 this morning, will give 5 mg of IV vitamin K  before discharge  Mild thrombocytopenia: Stable   Discharge Diagnoses:  Principal Problem:   Intracranial bleed [facility])    Discharge Instructions  Discharge Instructions     Diet - low sodium heart healthy   Complete by: As directed    Discharge instructions   Complete by: As directed    Follow with Primary MD Marquetta Sit, MD in 7 days   Get CBC, CMP,  checked  by Primary MD next visit.    Activity: As tolerated with Full fall precautions use walker/cane & assistance as needed   Disposition Home    Diet: Heart Healthy   On your next visit with your primary care physician please Get Medicines reviewed and adjusted.   Please request your Prim.MD to go over all Hospital Tests and Procedure/Radiological results at the follow up, please get all Hospital records sent to your Prim MD by signing hospital release before you go home.   If you experience worsening of your admission symptoms, develop shortness of breath, life threatening emergency,  suicidal or homicidal thoughts you must seek medical attention immediately by calling 911 or calling your MD immediately  if symptoms less severe.  You Must read complete instructions/literature along with all the possible adverse reactions/side effects for all the Medicines you take and that have been prescribed to you. Take any new  Medicines after you have completely understood and accpet all the possible adverse reactions/side effects.   Do not drive, operating heavy machinery, perform activities at heights, swimming or participation in water activities or provide baby sitting services if your were admitted for syncope or siezures until you have seen by Primary MD or a Neurologist and advised to do so again.  Do not drive when taking Pain medications.    Do not take more than prescribed Pain, Sleep and Anxiety Medications  Special Instructions: If you have smoked or chewed Tobacco  in the last 2 yrs please stop smoking, stop any regular Alcohol  and or any Recreational drug use.  Wear Seat belts while driving.   Please note  You were cared for by a hospitalist during your hospital stay. If you have any questions about your discharge medications or the care you received while you were in the hospital after you are discharged, you can call the unit and asked to speak with the hospitalist on call if the hospitalist that took care of you is not available. Once you are discharged, your primary care physician will handle any further medical issues. Please note that NO REFILLS for any discharge medications will be authorized once you are discharged, as it is imperative that you return to your primary care physician (or establish a relationship with a primary care physician if you do not have one) for your aftercare needs so that they can reassess your need for medications and monitor your lab values.   Discharge wound care:   Complete by: As directed    Wound care  Daily      Comments: 1. Bedside nurse; remove all previous compression wraps and dressings to bilat legs.  2. Apply xeroform gauze to open areas and cover with foam dressing.  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 Recent studies have raised concern that fluoroquinolone antibiotics could be associated with an increased risk of aortic aneurysm Fluoroquinolones have non-antimicrobial properties that might jeopardise the integrity of the extracellular matrix of the vascular wall In a  propensity score matched cohort study in [place], there was a 66% increased rate of aortic aneurysm or dissection associated with oral fluoroquinolone use, compared wit        Medication List     STOP taking these medications    warfarin 2.5 MG tablet Commonly known as: COUMADIN        TAKE these medications    cephALEXin 500 MG capsule Commonly known as: KEFLEX Take 1 capsule (500 mg total) by mouth every 8 (eight) hours for 5 days.   citalopram  20 MG tablet Commonly known as: CELEXA  TAKE 1 TABLET BY MOUTH EVERY DAY   Fish Oil 1000 MG Caps Take 1 capsule by mouth daily.   folic acid 1 MG tablet Commonly known as:  FOLVITE Take 1 tablet by mouth daily.   Glucos-Chondroit-MSM Complex Tabs Take 1 tablet by mouth 3 (three) times daily.   hydrochlorothiazide  12.5 MG capsule Commonly known as: MICROZIDE  TAKE 1 CAPSULE BY MOUTH EVERY DAY   metoprolol  tartrate 25 MG tablet Commonly known as: LOPRESSOR  TAKE 1/2 TABLET TWICE A DAY BY MOUTH   simvastatin  20 MG tablet Commonly known as: ZOCOR  Take 1 tablet (20 mg total) by mouth at bedtime.               Discharge Care Instructions  (From admission, onward)           Start     Ordered   01/09/24 0000  Discharge wound care:       Comments: Wound care  Daily      Comments: 1. Bedside nurse; remove all previous compression wraps and dressings to bilat legs.  2. Apply xeroform gauze to open areas and cover with foam dressing.  Change foam dressing Q 3 days or PRN soiling  01/09/24 1044   01/09/24 1159             Allergies  Allergen Reactions   Heparin      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 Recent studies have raised concern that fluoroquinolone antibiotics could be associated with an increased risk of aortic aneurysm Fluoroquinolones have non-antimicrobial properties that might jeopardise the integrity of the extracellular matrix of the vascular wall In a  propensity score matched cohort study in [place], there was a 66% increased rate of aortic aneurysm or dissection associated with oral fluoroquinolone use, compared wit    Consultations: neurosurgery   Procedures/Studies: CT HEAD WO CONTRAST ( ) Result Date: 01/09/2024 CLINICAL DATA:  83 year old male status post fall with trace para falcine blood. EXAM: CT HEAD WITHOUT CONTRAST TECHNIQUE: Contiguous axial images were obtained from the base of the skull through the vertex without intravenous contrast. RADIATION DOSE REDUCTION: This exam was performed according to the departmental dose-optimization program which includes automated exposure control, adjustment of the mA and/or kV according to patient size and/or use of iterative reconstruction technique. COMPARISON:  Head CT yesterday. FINDINGS: Brain: Trace left para falcine, left cingulate region subarachnoid blood on coronal image 56 is stable. There also appears to be trace subdural blood now layering on the right tentorium series 5, image 17. No other convincing acute intracranial hemorrhage. Cerebral volume is normal for age. No midline shift, ventriculomegaly, mass effect, evidence of mass lesion, or evidence of cortically based acute infarction. Gray-white differentiation is stable and within normal limits. Vascular: Calcified atherosclerosis at the skull base. No suspicious intracranial vascular hyperdensity. Skull: Stable.  No acute osseous abnormality identified. Sinuses/Orbits: Chronic  left maxillary sinusitis with mucoperiosteal thickening. Other Visualized paranasal sinuses and mastoids are stable and well aerated. Other: Broad-based anterior scalp hematoma is wider since yesterday. Underlying frontal bones and frontal sinuses appears stable and intact. No scalp soft tissue gas. Orbits soft tissues appear negative. IMPRESSION: 1. Trace left cingulate region subarachnoid blood is stable, but with addition now of trace Subdural Blood layering on the right tentorium. 2. No intracranial mass effect. No other new intracranial abnormality. 3. Enlarged anterior scalp hematoma, but no skull fracture identified. 4. Chronic left maxillary sinusitis. Electronically Signed   By: Marlise Simpers M.D.   On: 01/09/2024 06:02   CT Head Wo Contrast Addendum Date: 01/08/2024 ADDENDUM REPORT: 01/08/2024 13:56 ADDENDUM:  Please note that the x-ray right tibia fibula 01/08/2024 was mistakenly attached the CT head, max face, C-spine. The findings of the CT head were discussed as below: These results were called by telephone at the time of interpretation on 01/08/2024 at 1:53 pm to provider Dr. Arno Lapidus, who verbally acknowledged these results. --------------------------------------------------------------------------- -------------------- Please find read of the x-ray right tibia fibula below: CLINICAL DATA: Fall. RIGHT TIBIA AND FIBULA - 3 VIEW COMPARISON: None. FINDINGS: No evidence of fracture, dislocation, or joint effusion. No evidence of severe arthropathy. No aggressive appearing focal bone abnormality. Ankle and knee grossly unremarkable. Vascular calcifications. Soft tissues are unremarkable. IMPRESSION: No acute displaced fracture or dislocation. These results were called by telephone at the time of interpretation on 01/08/2024 at 1:53 pm to provider Dr. Arno Lapidus, who verbally acknowledged these results. Electronically Signed   By: Morgane  Naveau M.D.   On: 01/08/2024 13:56   Result Date: 01/08/2024 CLINICAL DATA:   Head trauma, minor (Age >= 65y) on coumadin ; fall, leg pain; Facial trauma, blunt; Neck trauma (Age >= 65y) EXAM: CT HEAD WITHOUT CONTRAST CT MAXILLOFACIAL WITHOUT CONTRAST CT CERVICAL SPINE WITHOUT CONTRAST TECHNIQUE: Multidetector CT imaging of the head, cervical spine, and maxillofacial structures were performed using the standard protocol without intravenous contrast. Multiplanar CT image reconstructions of the cervical spine and maxillofacial structures were also generated. RADIATION DOSE REDUCTION: This exam was performed according to the departmental dose-optimization program which includes automated exposure control, adjustment of the mA and/or kV according to patient size and/or use of iterative reconstruction technique. COMPARISON:  CT head 12/04/2015 FINDINGS: CT HEAD FINDINGS Brain: Trace patchy and confluent areas of decreased attenuation are noted throughout the deep and periventricular white matter of the cerebral hemispheres bilaterally, compatible with chronic microvascular ischemic disease. No evidence of large-territorial acute infarction. No parenchymal hemorrhage. No mass lesion. A 4 mm hyperdense foci left of the falx cerebri (1:18, 4:55). No mass effect or midline shift. No hydrocephalus. Basilar cisterns are patent. Vascular: No hyperdense vessel. Skull: No acute fracture or focal lesion. Other: 7 mm midline frontal scalp hematoma. CT MAXILLOFACIAL FINDINGS Osseous: No fracture or mandibular dislocation. No destructive process. Sinuses/Orbits: Complete opacification of left maxillary sinus. Otherwise paranasal sinuses and mastoid air cells are clear. Bilateral lens replacement. Otherwise the orbits are unremarkable. Soft tissues: Negative. CT CERVICAL SPINE FINDINGS Alignment: Grade 1 anterolisthesis of C2 on C3 and C3 on C4. Skull base and vertebrae: Multilevel moderate to severe degenerative changes of the spine. 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 No severe  osseous central canal stenosis. No acute fracture. No aggressive appearing focal osseous lesion or focal pathologic process. Soft tissues and spinal canal: No prevertebral fluid or swelling. No visible canal hematoma. Upper chest: Unremarkable. Other: None. IMPRESSION: 1. A 4 mm hyperdense foci left of the falx cerebri. Finding could represent a trace developing subdural or subarachnoid hemorrhage. 2. No acute displaced facial fracture. 3. No acute displaced fracture or traumatic listhesis of the cervical spine. 4. Multilevel moderate to severe degenerative changes of the spine. 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 Electronically Signed: By: Morgane  Naveau M.D. On: 01/08/2024 13:42   CT Cervical Spine Wo Contrast Addendum Date: 01/08/2024 ADDENDUM REPORT: 01/08/2024 13:56 ADDENDUM: Please note that the x-ray right tibia fibula 01/08/2024 was mistakenly attached the CT head, max face, C-spine. The findings of the CT head were discussed as below: These results were called by telephone at the time of interpretation on 01/08/2024 at 1:53 pm to provider Dr.  Arno Lapidus, who verbally acknowledged these results. --------------------------------------------------------------------------- -------------------- Please find read of the x-ray right tibia fibula below: CLINICAL DATA: Fall. RIGHT TIBIA AND FIBULA - 3 VIEW COMPARISON: None. FINDINGS: No evidence of fracture, dislocation, or joint effusion. No evidence of severe arthropathy. No aggressive appearing focal bone abnormality. Ankle and knee grossly unremarkable. Vascular calcifications. Soft tissues are unremarkable. IMPRESSION: No acute displaced fracture or dislocation. These results were called by telephone at the time of interpretation on 01/08/2024 at 1:53 pm to provider Dr. Arno Lapidus, who verbally acknowledged these results. Electronically Signed   By: Morgane  Naveau M.D.   On: 01/08/2024 13:56   Result Date: 01/08/2024 CLINICAL DATA:  Head  trauma, minor (Age >= 65y) on coumadin ; fall, leg pain; Facial trauma, blunt; Neck trauma (Age >= 65y) EXAM: CT HEAD WITHOUT CONTRAST CT MAXILLOFACIAL WITHOUT CONTRAST CT CERVICAL SPINE WITHOUT CONTRAST TECHNIQUE: Multidetector CT imaging of the head, cervical spine, and maxillofacial structures were performed using the standard protocol without intravenous contrast. Multiplanar CT image reconstructions of the cervical spine and maxillofacial structures were also generated. RADIATION DOSE REDUCTION: This exam was performed according to the departmental dose-optimization program which includes automated exposure control, adjustment of the mA and/or kV according to patient size and/or use of iterative reconstruction technique. COMPARISON:  CT head 12/04/2015 FINDINGS: CT HEAD FINDINGS Brain: Trace patchy and confluent areas of decreased attenuation are noted throughout the deep and periventricular white matter of the cerebral hemispheres bilaterally, compatible with chronic microvascular ischemic disease. No evidence of large-territorial acute infarction. No parenchymal hemorrhage. No mass lesion. A 4 mm hyperdense foci left of the falx cerebri (1:18, 4:55). No mass effect or midline shift. No hydrocephalus. Basilar cisterns are patent. Vascular: No hyperdense vessel. Skull: No acute fracture or focal lesion. Other: 7 mm midline frontal scalp hematoma. CT MAXILLOFACIAL FINDINGS Osseous: No fracture or mandibular dislocation. No destructive process. Sinuses/Orbits: Complete opacification of left maxillary sinus. Otherwise paranasal sinuses and mastoid air cells are clear. Bilateral lens replacement. Otherwise the orbits are unremarkable. Soft tissues: Negative. CT CERVICAL SPINE FINDINGS Alignment: Grade 1 anterolisthesis of C2 on C3 and C3 on C4. Skull base and vertebrae: Multilevel moderate to severe degenerative changes of the spine. 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 No severe osseous  central canal stenosis. No acute fracture. No aggressive appearing focal osseous lesion or focal pathologic process. Soft tissues and spinal canal: No prevertebral fluid or swelling. No visible canal hematoma. Upper chest: Unremarkable. Other: None. IMPRESSION: 1. A 4 mm hyperdense foci left of the falx cerebri. Finding could represent a trace developing subdural or subarachnoid hemorrhage. 2. No acute displaced facial fracture. 3. No acute displaced fracture or traumatic listhesis of the cervical spine. 4. Multilevel moderate to severe degenerative changes of the spine. 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 Electronically Signed: By: Morgane  Naveau M.D. On: 01/08/2024 13:42   CT Maxillofacial Wo Contrast Addendum Date: 01/08/2024 ADDENDUM REPORT: 01/08/2024 13:56 ADDENDUM: Please note that the x-ray right tibia fibula 01/08/2024 was mistakenly attached the CT head, max face, C-spine. The findings of the CT head were discussed as below: These results were called by telephone at the time of interpretation on 01/08/2024 at 1:53 pm to provider Dr. Arno Lapidus, who verbally acknowledged these results. --------------------------------------------------------------------------- -------------------- Please find read of the x-ray right tibia fibula below: CLINICAL DATA: Fall. RIGHT TIBIA AND FIBULA - 3 VIEW COMPARISON: None. FINDINGS: No evidence of fracture, dislocation, or joint effusion. No evidence of severe arthropathy. No aggressive appearing focal  bone abnormality. Ankle and knee grossly unremarkable. Vascular calcifications. Soft tissues are unremarkable. IMPRESSION: No acute displaced fracture or dislocation. These results were called by telephone at the time of interpretation on 01/08/2024 at 1:53 pm to provider Dr. Arno Lapidus, who verbally acknowledged these results. Electronically Signed   By: Morgane  Naveau M.D.   On: 01/08/2024 13:56   Result Date: 01/08/2024 CLINICAL DATA:  Head trauma,  minor (Age >= 65y) on coumadin ; fall, leg pain; Facial trauma, blunt; Neck trauma (Age >= 65y) EXAM: CT HEAD WITHOUT CONTRAST CT MAXILLOFACIAL WITHOUT CONTRAST CT CERVICAL SPINE WITHOUT CONTRAST TECHNIQUE: Multidetector CT imaging of the head, cervical spine, and maxillofacial structures were performed using the standard protocol without intravenous contrast. Multiplanar CT image reconstructions of the cervical spine and maxillofacial structures were also generated. RADIATION DOSE REDUCTION: This exam was performed according to the departmental dose-optimization program which includes automated exposure control, adjustment of the mA and/or kV according to patient size and/or use of iterative reconstruction technique. COMPARISON:  CT head 12/04/2015 FINDINGS: CT HEAD FINDINGS Brain: Trace patchy and confluent areas of decreased attenuation are noted throughout the deep and periventricular white matter of the cerebral hemispheres bilaterally, compatible with chronic microvascular ischemic disease. No evidence of large-territorial acute infarction. No parenchymal hemorrhage. No mass lesion. A 4 mm hyperdense foci left of the falx cerebri (1:18, 4:55). No mass effect or midline shift. No hydrocephalus. Basilar cisterns are patent. Vascular: No hyperdense vessel. Skull: No acute fracture or focal lesion. Other: 7 mm midline frontal scalp hematoma. CT MAXILLOFACIAL FINDINGS Osseous: No fracture or mandibular dislocation. No destructive process. Sinuses/Orbits: Complete opacification of left maxillary sinus. Otherwise paranasal sinuses and mastoid air cells are clear. Bilateral lens replacement. Otherwise the orbits are unremarkable. Soft tissues: Negative. CT CERVICAL SPINE FINDINGS Alignment: Grade 1 anterolisthesis of C2 on C3 and C3 on C4. Skull base and vertebrae: Multilevel moderate to severe degenerative changes of the spine. 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 No severe osseous central  canal stenosis. No acute fracture. No aggressive appearing focal osseous lesion or focal pathologic process. Soft tissues and spinal canal: No prevertebral fluid or swelling. No visible canal hematoma. Upper chest: Unremarkable. Other: None. IMPRESSION: 1. A 4 mm hyperdense foci left of the falx cerebri. Finding could represent a trace developing subdural or subarachnoid hemorrhage. 2. No acute displaced facial fracture. 3. No acute displaced fracture or traumatic listhesis of the cervical spine. 4. Multilevel moderate to severe degenerative changes of the spine. 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 Electronically Signed: By: Morgane  Naveau M.D. On: 01/08/2024 13:42   DG Tibia/Fibula Right Addendum Date: 01/08/2024 ADDENDUM REPORT: 01/08/2024 13:56 ADDENDUM: Please note that the x-ray right tibia fibula 01/08/2024 was mistakenly attached the CT head, max face, C-spine. The findings of the CT head were discussed as below: These results were called by telephone at the time of interpretation on 01/08/2024 at 1:53 pm to provider Dr. Arno Lapidus, who verbally acknowledged these results. --------------------------------------------------------------------------- -------------------- Please find read of the x-ray right tibia fibula below: CLINICAL DATA: Fall. RIGHT TIBIA AND FIBULA - 3 VIEW COMPARISON: None. FINDINGS: No evidence of fracture, dislocation, or joint effusion. No evidence of severe arthropathy. No aggressive appearing focal bone abnormality. Ankle and knee grossly unremarkable. Vascular calcifications. Soft tissues are unremarkable. IMPRESSION: No acute displaced fracture or dislocation. These results were called by telephone at the time of interpretation on 01/08/2024 at 1:53 pm to provider Dr. Arno Lapidus, who verbally acknowledged these results. Electronically Signed  By: Morgane  Naveau M.D.   On: 01/08/2024 13:56   Result Date: 01/08/2024 CLINICAL DATA:  Head trauma, minor (Age >=  65y) on coumadin ; fall, leg pain; Facial trauma, blunt; Neck trauma (Age >= 65y) EXAM: CT HEAD WITHOUT CONTRAST CT MAXILLOFACIAL WITHOUT CONTRAST CT CERVICAL SPINE WITHOUT CONTRAST TECHNIQUE: Multidetector CT imaging of the head, cervical spine, and maxillofacial structures were performed using the standard protocol without intravenous contrast. Multiplanar CT image reconstructions of the cervical spine and maxillofacial structures were also generated. RADIATION DOSE REDUCTION: This exam was performed according to the departmental dose-optimization program which includes automated exposure control, adjustment of the mA and/or kV according to patient size and/or use of iterative reconstruction technique. COMPARISON:  CT head 12/04/2015 FINDINGS: CT HEAD FINDINGS Brain: Trace patchy and confluent areas of decreased attenuation are noted throughout the deep and periventricular white matter of the cerebral hemispheres bilaterally, compatible with chronic microvascular ischemic disease. No evidence of large-territorial acute infarction. No parenchymal hemorrhage. No mass lesion. A 4 mm hyperdense foci left of the falx cerebri (1:18, 4:55). No mass effect or midline shift. No hydrocephalus. Basilar cisterns are patent. Vascular: No hyperdense vessel. Skull: No acute fracture or focal lesion. Other: 7 mm midline frontal scalp hematoma. CT MAXILLOFACIAL FINDINGS Osseous: No fracture or mandibular dislocation. No destructive process. Sinuses/Orbits: Complete opacification of left maxillary sinus. Otherwise paranasal sinuses and mastoid air cells are clear. Bilateral lens replacement. Otherwise the orbits are unremarkable. Soft tissues: Negative. CT CERVICAL SPINE FINDINGS Alignment: Grade 1 anterolisthesis of C2 on C3 and C3 on C4. Skull base and vertebrae: Multilevel moderate to severe degenerative changes of the spine. 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 No severe osseous central canal stenosis.  No acute fracture. No aggressive appearing focal osseous lesion or focal pathologic process. Soft tissues and spinal canal: No prevertebral fluid or swelling. No visible canal hematoma. Upper chest: Unremarkable. Other: None. IMPRESSION: 1. A 4 mm hyperdense foci left of the falx cerebri. Finding could represent a trace developing subdural or subarachnoid hemorrhage. 2. No acute displaced facial fracture. 3. No acute displaced fracture or traumatic listhesis of the cervical spine. 4. Multilevel moderate to severe degenerative changes of the spine. 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 Electronically Signed: By: Morgane  Naveau M.D. On: 01/08/2024 13:42     Subjective: No significant events overnight, he denies any complaints today, ambulated in the hallway with OT with no deficits or unsteady gait  Discharge Exam: Vitals:   01/09/24 0800 01/09/24 1100  BP: (!) 146/53 118/73  Pulse: (!) 119   Resp: 20   Temp: 99 F (37.2 C) 97.9 F (36.6 C)  SpO2: (!) 83%    Vitals:   01/08/24 2326 01/09/24 0439 01/09/24 0800 01/09/24 1100  BP: (!) 118/55 (!) 123/57 (!) 146/53 118/73  Pulse: (!) 57 (!) 59 (!) 119   Resp: 18 18 20    Temp: 98.6 F (37 C) 98.1 F (36.7 C) 99 F (37.2 C) 97.9 F (36.6 C)  TempSrc: Oral Oral Oral Oral  SpO2: 95% 93% (!) 83%   Weight:      Height:        General: Pt is alert, awake, not in acute distress, significant bruising in the face, left upper extremity and bilateral lower extremity Cardiovascular: RRR, S1/S2 +, no rubs, no gallops Respiratory: CTA bilaterally, no wheezing, no rhonchi Abdominal: Soft, NT, ND, bowel sounds + Extremities: no edema, no cyanosis          25  The results of significant diagnostics from this hospitalization (including imaging, microbiology, ancillary and laboratory) are listed below for reference.     Microbiology: No results found for this or any previous visit (from the past 240 hours).    Labs: BNP (last 3 results) No results for input(s): "BNP" in the last 8760 hours. Basic Metabolic Panel: Recent Labs  Lab 01/08/24 1314 01/09/24 0324  NA 137 136  K 3.8 3.5  CL 102 102  CO2 24 27  GLUCOSE 96 111*  BUN 18 16  CREATININE 1.13 1.10  CALCIUM  8.9 8.6*   Liver Function Tests: No results for input(s): "AST", "ALT", "ALKPHOS", "BILITOT", "PROT", "ALBUMIN " in the last 168 hours. No results for input(s): "LIPASE", "AMYLASE" in the last 168 hours. No results for input(s): "AMMONIA" in the last 168 hours. CBC: Recent Labs  Lab 01/08/24 1435 01/09/24 0324  WBC 6.4 9.2  NEUTROABS 2.8  --   HGB 14.2 13.4  HCT 39.6 37.3*  MCV 89.4 89.2  PLT 122* 113*   Cardiac Enzymes: No results for input(s): "CKTOTAL", "CKMB", "CKMBINDEX", "TROPONINI" in the last 168 hours. BNP: Invalid input(s): "POCBNP" CBG: Recent Labs  Lab 01/08/24 2057  GLUCAP 153*   D-Dimer No results for input(s): "DDIMER" in the last 72 hours. Hgb A1c Recent Labs    01/08/24 1818  HGBA1C 5.9*   Lipid Profile No results for input(s): "CHOL", "HDL", "LDLCALC", "TRIG", "CHOLHDL", "LDLDIRECT" in the last 72 hours. Thyroid  function studies No results for input(s): "TSH", "T4TOTAL", "T3FREE", "THYROIDAB" in the last 72 hours.  Invalid input(s): "FREET3" Anemia work up No results for input(s): "VITAMINB12", "FOLATE", "FERRITIN", "TIBC", "IRON", "RETICCTPCT" in the last 72 hours. Urinalysis    Component Value Date/Time   COLORURINE YELLOW 12/04/2015 1823   APPEARANCEUR Clear 06/01/2017 1446   LABSPEC 1.020 12/04/2015 1823   PHURINE 7.0 12/04/2015 1823   GLUCOSEU Negative 06/01/2017 1446   HGBUR NEGATIVE 12/04/2015 1823   HGBUR negative 09/25/2009 1105   BILIRUBINUR Negative 06/01/2017 1446   KETONESUR NEGATIVE 12/04/2015 1823   PROTEINUR Negative 06/01/2017 1446   PROTEINUR NEGATIVE 12/04/2015 1823   UROBILINOGEN 0.2 05/27/2017 1508   UROBILINOGEN 0.2 09/25/2009 1105   NITRITE Negative  06/01/2017 1446   NITRITE NEGATIVE 12/04/2015 1823   LEUKOCYTESUR Negative 06/01/2017 1446   Sepsis Labs Recent Labs  Lab 01/08/24 1435 01/09/24 0324  WBC 6.4 9.2   Microbiology No results found for this or any previous visit (from the past 240 hours).   Time coordinating discharge: Over 30 minutes  SIGNED:   Seena Dadds, MD  [facility] 01/09/2024, 12:00 PM Pager   If 7PM-7AM, please contact night-coverage [email or web address]

## 2024-01-09 NOTE — TOC Transition Note (Signed)
 Transition of Care [facility]) - Discharge Note   Patient Details  Name: Don Medina MRN: [id number] Date of Birth: 11-Apr-1941  [facility] CM/SW Contact:  Jeani Mill, RN Phone Number: 01/09/2024, 12:35 PM   Clinical Narrative:    Patient stable to discharge home.  No TOC needs at this time.    Final next level of care: Home/Self Care Barriers to Discharge: Barriers Resolved   Patient Goals and CMS Choice Patient states their goals for this hospitalization and ongoing recovery are:: return home          Discharge Placement            Home           Discharge Plan and Services Additional resources added to the After Visit Summary for                                       Social Drivers of Health (SDOH) Interventions SDOH Screenings   Food Insecurity: No Food Insecurity (09/13/2023)  Housing: High Risk (09/13/2023)  Transportation Needs: No Transportation Needs (09/13/2023)  Alcohol Screen: Low Risk  (12/03/2020)  Depression (PHQ2-9): Low Risk  (09/14/2023)  Financial Resource Strain: Low Risk  (09/13/2023)  Physical Activity: Sufficiently Active (09/13/2023)  Social Connections: Moderately Integrated (09/13/2023)  Stress: No Stress Concern Present (09/13/2023)  Tobacco Use: Medium Risk (11/07/2023)     Readmission Risk Interventions     No data to display

## 2024-01-09 NOTE — Care Management Obs Status (Signed)
 MEDICARE OBSERVATION STATUS NOTIFICATION   Patient Details  Name: Don Medina MRN: [id number] Date of Birth: 07-13-1941   Medicare Observation Status Notification Given:  Yes  [place]oon/Obs letter signed and copy given  Wynonia Hedges 01/09/2024, 11:59 AM

## 2024-01-09 NOTE — Progress Notes (Signed)
 DISCHARGE NOTE HOME Jailan Trimm to be discharged Home per MD order. Discussed prescriptions and follow up appointments with the patient. Prescriptions given to patient; medication list explained in detail. Patient verbalized understanding.  Skin clean, dry and intact without evidence of skin break down, no evidence of skin tears noted. IV catheter discontinued intact. Site without signs and symptoms of complications. Dressing and pressure applied. Pt denies pain at the site currently. No complaints noted. See LDA for wounds face and bilateral legs  An After Visit Summary (AVS) was printed and given to the patient. Patient escorted via wheelchair, and discharged home via private auto.  Tonda Francisco, RN

## 2024-01-09 NOTE — Evaluation (Signed)
 Occupational Therapy Evaluation Patient Details Name: Don Medina MRN: [id number] DOB: 1941-05-11 Today's Date: 01/09/2024   History of Present Illness   83 y/o M on DM HTN, HLD, aortic dissection s/p repair history, A fib on coumadin  presenting with fall from ladder.  CT Head 01/09/24:Trace left cingulate region subarachnoid blood is stable, but  with addition now of trace Subdural Blood layering on the right  tentorium.     Clinical Impressions Patient admitted for the diagnosis above.  PTA he lives at home with his spouse and needed no assist for any aspect of ADL,iADL or mobility.  Patient is at his baseline and remains independent with all ADL and mobility in the acute setting.  No further acute OT needs, and no post acute OT is recommended.  OT messaged the MD regarding PT can screen as patient is at baseline.       If plan is discharge home, recommend the following:   Assist for transportation     Functional Status Assessment   Patient has not had a recent decline in their functional status     Equipment Recommendations   None recommended by OT     Recommendations for Other Services         Precautions/Restrictions   Precautions Precautions: None Restrictions Weight Bearing Restrictions Per Provider Order: No     Mobility Bed Mobility Overal bed mobility: Independent                  Transfers Overall transfer level: Independent                        Balance Overall balance assessment: No apparent balance deficits (not formally assessed)                                         ADL either performed or assessed with clinical judgement   ADL Overall ADL's : Independent                                             Vision Baseline Vision/History: 1 Wears glasses Patient Visual Report: No change from baseline       Perception Perception: Within Functional Limits       Praxis  Praxis: WFL       Pertinent Vitals/Pain Pain Assessment Pain Assessment: Faces Faces Pain Scale: Hurts a little bit Pain Location: head Pain Descriptors / Indicators: Sore Pain Intervention(s): Monitored during session     Extremity/Trunk Assessment Upper Extremity Assessment Upper Extremity Assessment: Overall WFL for tasks assessed   Lower Extremity Assessment Lower Extremity Assessment: Overall WFL for tasks assessed   Cervical / Trunk Assessment Cervical / Trunk Assessment: Normal   Communication Communication Communication: No apparent difficulties   Cognition Arousal: Alert Behavior During Therapy: WFL for tasks assessed/performed Cognition: No apparent impairments                               Following commands: Intact       Cueing  General Comments   Cueing Techniques: Verbal cues;Gestural cues      Exercises     Shoulder Instructions      Home Living Family/patient expects to be  discharged to:: Private residence Living Arrangements: Spouse/significant other Available Help at Discharge: Family;Available 24 hours/day Type of Home: House Home Access: Stairs to enter [employer] of Steps: 2   Home Layout: One level     Bathroom Shower/Tub: Chief Strategy Officer: Standard Bathroom Accessibility: Yes How Accessible: Accessible via walker Home Equipment: Rolling Walker (2 wheels)          Prior Functioning/Environment Prior Level of Function : Independent/Modified Independent;Driving             Mobility Comments: Walks without an AD, continues to drive and goes to the gym 5x/wk ADLs Comments: In with ADL, iADL, continues to stay active and work around the house.    OT Problem List: Pain   OT Treatment/Interventions:        OT Goals(Current goals can be found in the care plan section)   Acute Rehab OT Goals Patient Stated Goal: Hoping to go home today OT Goal Formulation: With patient Time  For Goal Achievement: 01/23/24 Potential to Achieve Goals: Good   OT Frequency:       Co-evaluation              AM-PAC OT "6 Clicks" Daily Activity     Outcome Measure Help from another person eating meals?: None Help from another person taking care of personal grooming?: None Help from another person toileting, which includes using toliet, bedpan, or urinal?: None Help from another person bathing (including washing, rinsing, drying)?: None Help from another person to put on and taking off regular upper body clothing?: None Help from another person to put on and taking off regular lower body clothing?: None 6 Click Score: 24   End of Session Nurse Communication: Mobility status  Activity Tolerance: Patient tolerated treatment well Patient left: in chair;with call bell/phone within reach  OT Visit Diagnosis: Unsteadiness on feet (R26.81)                Time: 7829-5621 OT Time Calculation (min): 19 min Charges:  OT General Charges $OT Visit: 1 Visit OT Evaluation $OT Eval Moderate Complexity: 1 Mod  01/09/2024  RP, OTR/L  Acute Rehabilitation Services  Office:  [phone]   Don Medina 01/09/2024, 11:59 AM

## 2024-01-09 NOTE — Progress Notes (Signed)
 PT Note  Patient Details Name: Don Medina MRN: [id number] DOB: [birth year]   Cancelled Treatment:    Reason Eval/Treat Not Completed: OT screened, no needs identified, will sign off  Spoke with occupational therapy after their initial evaluation. OT reports patient is functioning at a high level of independence and no physical therapy is indicated at this time. PT is signing-off. Please re-order if there is any significant change in status. Thank you for this referral.  Jory Ng, PT, DPT [facility]  Rehabilitation Services Physical Therapist Office: [phone] Website: [email or web address]     Alinda Irani 01/09/2024, 12:02 PM

## 2024-01-09 NOTE — Consult Note (Signed)
 WOC Nurse Consult Note: Reason for Consult: Consult requested for leg wound; Pt fell prior to admission.  Performed remotely after review of progress notes and photos in the EMR.  Right anterior calf with full thickness abrasion, dark red with old dried blood.  Topical treatment orders provided for bedside nurses to perform as follows to promote moist healing:  1. Bedside nurse; remove all previous compression wraps and dressings to bilat legs.  2. Apply xeroform gauze to open areas and cover with foam dressing.  Change foam dressing Q 3 days or PRN soiling. Please re-consult if further assistance is needed.  Thank-you,  Wiliam Harder MSN, RN, CWOCN, [place], CNS [phone]

## 2024-01-09 NOTE — Consult Note (Signed)
 Reason for Consult:SDH Referring Physician: EDP  Don Medina is an 83 y.o. male.   HPI:  83 year old male presented to [facility] ED yesterday after falling off of a ladder. Patient denies any headaches, NV or dizziness. Patient is on coumadin  for afib. Has a history of aortic dissection.   Past Medical History:  Diagnosis Date   ADJ DISORDER WITH MIXED ANXIETY & DEPRESSED MOOD 09/08/2009   Qualifier: Diagnosis of  By: Darren Em MD, Bruce     Arthritis    Atrial fibrillation (HCC) [I48.91] 05/23/2017   Blood in the stool    BRADYCARDIA 03/10/2010   Qualifier: Diagnosis of  By: Darren Em MD, Bruce     CHEST PAIN 03/19/2010   Qualifier: Diagnosis of  By: York, LPN, Christine     Chicken pox    COLONIC POLYPS, HX OF 01/13/2009   Qualifier: Diagnosis of  By: Ivy Marseilles LPN, Nancy     Episode of generalized weakness 02/17/2016   Essential hypertension 03/15/2022   GERD (gastroesophageal reflux disease) 10/03/2012   Hay fever    History of colonic polyps    History of colonoscopy    Hyperlipidemia    MUSCLE STRAIN, HAMSTRING MUSCLE 09/08/2009   Qualifier: Diagnosis of  By: Darren Em MD, Carman Chimera DIZZINESS 03/05/2010   Qualifier: Diagnosis of  By: Darren Em MD, Bruce     Other premature beats 03/17/2010   Qualifier: Diagnosis of  By: Carolynne Citron, MD, Norva Beecham    Premature ventricular contraction 01/05/2011   S/P aortic dissection repair 05/08/2017   THROMBOCYTOPENIA 03/05/2010   Qualifier: Diagnosis of  By: Darren Em MD, Bruce     Weakness 12/09/2015    Past Surgical History:  Procedure Laterality Date   ABDOMINAL ADHESION SURGERY  2007   AORTIC INTERVENTION N/A 05/08/2017   Procedure: HYPOTHERMIC CIRCULATORY ARREST;  Surgeon: Norita Beauvais, MD;  Location: [facility] OR;  Service: Open Heart Surgery;  Laterality: N/A;   ASCENDING AORTIC ROOT REPLACEMENT N/A 05/08/2017   Procedure: REPLACEMENT OF ASCENDING AORTIC ARCH;  Surgeon: Norita Beauvais, MD;  Location: [facility] OR;   Service: Open Heart Surgery;  Laterality: N/A;   CANNULATION FOR CARDIOPULMONARY BYPASS Right 05/08/2017   Procedure: AXILLARY CANNULATION;  Surgeon: Norita Beauvais, MD;  Location: [facility] OR;  Service: Open Heart Surgery;  Laterality: Right;   ENDOVASCULAR REPAIR/STENT GRAFT  05/08/2017   Procedure: STENT OF LEFT SUBCLAVIAN ARTERY with [id number];  Surgeon: Norita Beauvais, MD;  Location: [facility] OR;  Service: Open Heart Surgery;;   herniated diks surgery L-5  1996   herniated disk surgery c 6-7  1986   INTRAOPERATIVE TRANSESOPHAGEAL ECHOCARDIOGRAM N/A 05/08/2017   Procedure: INTRAOPERATIVE TRANSESOPHAGEAL ECHOCARDIOGRAM;  Surgeon: Norita Beauvais, MD;  Location: [facility] OR;  Service: Open Heart Surgery;  Laterality: N/A;   REPLACEMENT ASCENDING AORTA N/A 05/08/2017   Procedure: REPLACEMENT ASCENDING AORTA;  Surgeon: Norita Beauvais, MD;  Location: [facility] OR;  Service: Open Heart Surgery;  Laterality: N/A;   THORACIC AORTIC ENDOVASCULAR STENT GRAFT  05/08/2017   Procedure: STENT GRAFTING OF DESCENDING THORACIC AORTA;  Surgeon: Norita Beauvais, MD;  Location: [facility] OR;  Service: Open Heart Surgery;;    Allergies  Allergen Reactions   Heparin      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 Recent studies have raised concern that fluoroquinolone antibiotics could be associated with an increased risk of aortic  aneurysm Fluoroquinolones have non-antimicrobial properties that might jeopardise the integrity of the extracellular matrix of the vascular wall In a  propensity score matched cohort study in [place], there was a 66% increased rate of aortic aneurysm or dissection associated with oral fluoroquinolone use, compared wit    Social History   Tobacco Use   Smoking status: Former    Current packs/day: 0.00    Types: Cigarettes    Quit date: 08/09/1974    Years since quitting: 49.4   Smokeless tobacco: Never   Substance Use Topics   Alcohol use: No    Family History  Problem Relation Age of Onset   Alzheimer's disease Father 49   Dementia Father    Stroke Mother 77   Cancer Brother        Leukemia   Sudden death Other        family   Hypertension Other    Heart disease Neg Hx      Review of Systems  Positive ROS: as above  All other systems have been reviewed and were otherwise negative with the exception of those mentioned in the HPI and as above.  Objective: Vital signs in last 24 hours: Temp:  [97.8 F (36.6 C)-99 F (37.2 C)] 97.9 F (36.6 C) (06/02 1100) Pulse Rate:  [56-119] 119 (06/02 0800) Resp:  [13-20] 20 (06/02 0800) BP: (118-159)/(53-75) 118/73 (06/02 1100) SpO2:  [83 %-100 %] 83 % (06/02 0800) Weight:  [79.4 kg] 79.4 kg (06/01 1638)  General Appearance: Alert, cooperative, no distress, appears stated age Head: Normocephalic, without obvious abnormality, various bruises and lacerations Eyes: PERRL, conjunctiva/corneas clear, EOM's intact, fundi benign, both eyes, bruising around eyes      Lungs:  respirations unlabored Heart: Regular rate and rhythm Extremities: Extremities normal, atraumatic, no cyanosis or edema Pulses: 2+ and symmetric all extremities Skin: Skin color, texture, turgor normal, no rashes or lesions  NEUROLOGIC:   Mental status: A&O x4, no aphasia, good attention span, Memory and fund of knowledge Motor Exam - grossly normal, normal tone and bulk Sensory Exam - grossly normal Reflexes: symmetric, no pathologic reflexes, No Hoffman's, No clonus Coordination - grossly normal Gait - grossly normal Balance - grossly normal Cranial Nerves: I: smell Not tested  II: visual acuity  OS: na    OD: na  II: visual fields Full to confrontation  II: pupils Equal, round, reactive to light  III,VII: ptosis None  III,IV,VI: extraocular muscles  Full ROM  V: mastication Normal  V: facial light touch sensation  Normal  V,VII: corneal reflex  Present   VII: facial muscle function - upper  Normal  VII: facial muscle function - lower Normal  VIII: hearing Not tested  IX: soft palate elevation  Normal  IX,X: gag reflex Present  XI: trapezius strength  5/5  XI: sternocleidomastoid strength 5/5  XI: neck flexion strength  5/5  XII: tongue strength  Normal    Data Review Lab Results  Component Value Date   WBC 9.2 01/09/2024   HGB 13.4 01/09/2024   HCT 37.3 (L) 01/09/2024   MCV 89.2 01/09/2024   PLT 113 (L) 01/09/2024   Lab Results  Component Value Date   NA 136 01/09/2024   K 3.5 01/09/2024   CL 102 01/09/2024   CO2 27 01/09/2024   BUN 16 01/09/2024   CREATININE 1.10 01/09/2024   GLUCOSE 111 (H) 01/09/2024   Lab Results  Component Value Date   INR 1.8 (H) 01/09/2024    Radiology:  CT HEAD WO CONTRAST ( ) Result Date: 01/09/2024 CLINICAL DATA:  83 year old male status post fall with trace para falcine blood. EXAM: CT HEAD WITHOUT CONTRAST TECHNIQUE: Contiguous axial images were obtained from the base of the skull through the vertex without intravenous contrast. RADIATION DOSE REDUCTION: This exam was performed according to the departmental dose-optimization program which includes automated exposure control, adjustment of the mA and/or kV according to patient size and/or use of iterative reconstruction technique. COMPARISON:  Head CT yesterday. FINDINGS: Brain: Trace left para falcine, left cingulate region subarachnoid blood on coronal image 56 is stable. There also appears to be trace subdural blood now layering on the right tentorium series 5, image 17. No other convincing acute intracranial hemorrhage. Cerebral volume is normal for age. No midline shift, ventriculomegaly, mass effect, evidence of mass lesion, or evidence of cortically based acute infarction. Gray-white differentiation is stable and within normal limits. Vascular: Calcified atherosclerosis at the skull base. No suspicious intracranial vascular hyperdensity. Skull:  Stable.  No acute osseous abnormality identified. Sinuses/Orbits: Chronic left maxillary sinusitis with mucoperiosteal thickening. Other Visualized paranasal sinuses and mastoids are stable and well aerated. Other: Broad-based anterior scalp hematoma is wider since yesterday. Underlying frontal bones and frontal sinuses appears stable and intact. No scalp soft tissue gas. Orbits soft tissues appear negative. IMPRESSION: 1. Trace left cingulate region subarachnoid blood is stable, but with addition now of trace Subdural Blood layering on the right tentorium. 2. No intracranial mass effect. No other new intracranial abnormality. 3. Enlarged anterior scalp hematoma, but no skull fracture identified. 4. Chronic left maxillary sinusitis. Electronically Signed   By: Marlise Simpers M.D.   On: 01/09/2024 06:02   CT Head Wo Contrast Addendum Date: 01/08/2024 ADDENDUM REPORT: 01/08/2024 13:56 ADDENDUM: Please note that the x-ray right tibia fibula 01/08/2024 was mistakenly attached the CT head, max face, C-spine. The findings of the CT head were discussed as below: These results were called by telephone at the time of interpretation on 01/08/2024 at 1:53 pm to provider Dr. Arno Lapidus, who verbally acknowledged these results. --------------------------------------------------------------------------- -------------------- Please find read of the x-ray right tibia fibula below: CLINICAL DATA: Fall. RIGHT TIBIA AND FIBULA - 3 VIEW COMPARISON: None. FINDINGS: No evidence of fracture, dislocation, or joint effusion. No evidence of severe arthropathy. No aggressive appearing focal bone abnormality. Ankle and knee grossly unremarkable. Vascular calcifications. Soft tissues are unremarkable. IMPRESSION: No acute displaced fracture or dislocation. These results were called by telephone at the time of interpretation on 01/08/2024 at 1:53 pm to provider Dr. Arno Lapidus, who verbally acknowledged these results. Electronically Signed   By: Morgane   Naveau M.D.   On: 01/08/2024 13:56   Result Date: 01/08/2024 CLINICAL DATA:  Head trauma, minor (Age >= 65y) on coumadin ; fall, leg pain; Facial trauma, blunt; Neck trauma (Age >= 65y) EXAM: CT HEAD WITHOUT CONTRAST CT MAXILLOFACIAL WITHOUT CONTRAST CT CERVICAL SPINE WITHOUT CONTRAST TECHNIQUE: Multidetector CT imaging of the head, cervical spine, and maxillofacial structures were performed using the standard protocol without intravenous contrast. Multiplanar CT image reconstructions of the cervical spine and maxillofacial structures were also generated. RADIATION DOSE REDUCTION: This exam was performed according to the departmental dose-optimization program which includes automated exposure control, adjustment of the mA and/or kV according to patient size and/or use of iterative reconstruction technique. COMPARISON:  CT head 12/04/2015 FINDINGS: CT HEAD FINDINGS Brain: Trace patchy and confluent areas of decreased attenuation are noted throughout the deep and periventricular white matter of the cerebral hemispheres bilaterally, compatible with chronic  microvascular ischemic disease. No evidence of large-territorial acute infarction. No parenchymal hemorrhage. No mass lesion. A 4 mm hyperdense foci left of the falx cerebri (1:18, 4:55). No mass effect or midline shift. No hydrocephalus. Basilar cisterns are patent. Vascular: No hyperdense vessel. Skull: No acute fracture or focal lesion. Other: 7 mm midline frontal scalp hematoma. CT MAXILLOFACIAL FINDINGS Osseous: No fracture or mandibular dislocation. No destructive process. Sinuses/Orbits: Complete opacification of left maxillary sinus. Otherwise paranasal sinuses and mastoid air cells are clear. Bilateral lens replacement. Otherwise the orbits are unremarkable. Soft tissues: Negative. CT CERVICAL SPINE FINDINGS Alignment: Grade 1 anterolisthesis of C2 on C3 and C3 on C4. Skull base and vertebrae: Multilevel moderate to severe degenerative changes of the spine.  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 No severe osseous central canal stenosis. No acute fracture. No aggressive appearing focal osseous lesion or focal pathologic process. Soft tissues and spinal canal: No prevertebral fluid or swelling. No visible canal hematoma. Upper chest: Unremarkable. Other: None. IMPRESSION: 1. A 4 mm hyperdense foci left of the falx cerebri. Finding could represent a trace developing subdural or subarachnoid hemorrhage. 2. No acute displaced facial fracture. 3. No acute displaced fracture or traumatic listhesis of the cervical spine. 4. Multilevel moderate to severe degenerative changes of the spine. 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 Electronically Signed: By: Morgane  Naveau M.D. On: 01/08/2024 13:42   CT Cervical Spine Wo Contrast Addendum Date: 01/08/2024 ADDENDUM REPORT: 01/08/2024 13:56 ADDENDUM: Please note that the x-ray right tibia fibula 01/08/2024 was mistakenly attached the CT head, max face, C-spine. The findings of the CT head were discussed as below: These results were called by telephone at the time of interpretation on 01/08/2024 at 1:53 pm to provider Dr. Arno Lapidus, who verbally acknowledged these results. --------------------------------------------------------------------------- -------------------- Please find read of the x-ray right tibia fibula below: CLINICAL DATA: Fall. RIGHT TIBIA AND FIBULA - 3 VIEW COMPARISON: None. FINDINGS: No evidence of fracture, dislocation, or joint effusion. No evidence of severe arthropathy. No aggressive appearing focal bone abnormality. Ankle and knee grossly unremarkable. Vascular calcifications. Soft tissues are unremarkable. IMPRESSION: No acute displaced fracture or dislocation. These results were called by telephone at the time of interpretation on 01/08/2024 at 1:53 pm to provider Dr. Arno Lapidus, who verbally acknowledged these results. Electronically Signed   By: Morgane  Naveau  M.D.   On: 01/08/2024 13:56   Result Date: 01/08/2024 CLINICAL DATA:  Head trauma, minor (Age >= 65y) on coumadin ; fall, leg pain; Facial trauma, blunt; Neck trauma (Age >= 65y) EXAM: CT HEAD WITHOUT CONTRAST CT MAXILLOFACIAL WITHOUT CONTRAST CT CERVICAL SPINE WITHOUT CONTRAST TECHNIQUE: Multidetector CT imaging of the head, cervical spine, and maxillofacial structures were performed using the standard protocol without intravenous contrast. Multiplanar CT image reconstructions of the cervical spine and maxillofacial structures were also generated. RADIATION DOSE REDUCTION: This exam was performed according to the departmental dose-optimization program which includes automated exposure control, adjustment of the mA and/or kV according to patient size and/or use of iterative reconstruction technique. COMPARISON:  CT head 12/04/2015 FINDINGS: CT HEAD FINDINGS Brain: Trace patchy and confluent areas of decreased attenuation are noted throughout the deep and periventricular white matter of the cerebral hemispheres bilaterally, compatible with chronic microvascular ischemic disease. No evidence of large-territorial acute infarction. No parenchymal hemorrhage. No mass lesion. A 4 mm hyperdense foci left of the falx cerebri (1:18, 4:55). No mass effect or midline shift. No hydrocephalus. Basilar cisterns are patent. Vascular: No hyperdense vessel. Skull: No acute fracture  or focal lesion. Other: 7 mm midline frontal scalp hematoma. CT MAXILLOFACIAL FINDINGS Osseous: No fracture or mandibular dislocation. No destructive process. Sinuses/Orbits: Complete opacification of left maxillary sinus. Otherwise paranasal sinuses and mastoid air cells are clear. Bilateral lens replacement. Otherwise the orbits are unremarkable. Soft tissues: Negative. CT CERVICAL SPINE FINDINGS Alignment: Grade 1 anterolisthesis of C2 on C3 and C3 on C4. Skull base and vertebrae: Multilevel moderate to severe degenerative changes of the spine.  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 No severe osseous central canal stenosis. No acute fracture. No aggressive appearing focal osseous lesion or focal pathologic process. Soft tissues and spinal canal: No prevertebral fluid or swelling. No visible canal hematoma. Upper chest: Unremarkable. Other: None. IMPRESSION: 1. A 4 mm hyperdense foci left of the falx cerebri. Finding could represent a trace developing subdural or subarachnoid hemorrhage. 2. No acute displaced facial fracture. 3. No acute displaced fracture or traumatic listhesis of the cervical spine. 4. Multilevel moderate to severe degenerative changes of the spine. 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 Electronically Signed: By: Morgane  Naveau M.D. On: 01/08/2024 13:42   CT Maxillofacial Wo Contrast Addendum Date: 01/08/2024 ADDENDUM REPORT: 01/08/2024 13:56 ADDENDUM: Please note that the x-ray right tibia fibula 01/08/2024 was mistakenly attached the CT head, max face, C-spine. The findings of the CT head were discussed as below: These results were called by telephone at the time of interpretation on 01/08/2024 at 1:53 pm to provider Dr. Arno Lapidus, who verbally acknowledged these results. --------------------------------------------------------------------------- -------------------- Please find read of the x-ray right tibia fibula below: CLINICAL DATA: Fall. RIGHT TIBIA AND FIBULA - 3 VIEW COMPARISON: None. FINDINGS: No evidence of fracture, dislocation, or joint effusion. No evidence of severe arthropathy. No aggressive appearing focal bone abnormality. Ankle and knee grossly unremarkable. Vascular calcifications. Soft tissues are unremarkable. IMPRESSION: No acute displaced fracture or dislocation. These results were called by telephone at the time of interpretation on 01/08/2024 at 1:53 pm to provider Dr. Arno Lapidus, who verbally acknowledged these results. Electronically Signed   By: Morgane  Naveau  M.D.   On: 01/08/2024 13:56   Result Date: 01/08/2024 CLINICAL DATA:  Head trauma, minor (Age >= 65y) on coumadin ; fall, leg pain; Facial trauma, blunt; Neck trauma (Age >= 65y) EXAM: CT HEAD WITHOUT CONTRAST CT MAXILLOFACIAL WITHOUT CONTRAST CT CERVICAL SPINE WITHOUT CONTRAST TECHNIQUE: Multidetector CT imaging of the head, cervical spine, and maxillofacial structures were performed using the standard protocol without intravenous contrast. Multiplanar CT image reconstructions of the cervical spine and maxillofacial structures were also generated. RADIATION DOSE REDUCTION: This exam was performed according to the departmental dose-optimization program which includes automated exposure control, adjustment of the mA and/or kV according to patient size and/or use of iterative reconstruction technique. COMPARISON:  CT head 12/04/2015 FINDINGS: CT HEAD FINDINGS Brain: Trace patchy and confluent areas of decreased attenuation are noted throughout the deep and periventricular white matter of the cerebral hemispheres bilaterally, compatible with chronic microvascular ischemic disease. No evidence of large-territorial acute infarction. No parenchymal hemorrhage. No mass lesion. A 4 mm hyperdense foci left of the falx cerebri (1:18, 4:55). No mass effect or midline shift. No hydrocephalus. Basilar cisterns are patent. Vascular: No hyperdense vessel. Skull: No acute fracture or focal lesion. Other: 7 mm midline frontal scalp hematoma. CT MAXILLOFACIAL FINDINGS Osseous: No fracture or mandibular dislocation. No destructive process. Sinuses/Orbits: Complete opacification of left maxillary sinus. Otherwise paranasal sinuses and mastoid air cells are clear. Bilateral lens replacement. Otherwise the orbits are unremarkable. Soft tissues:  Negative. CT CERVICAL SPINE FINDINGS Alignment: Grade 1 anterolisthesis of C2 on C3 and C3 on C4. Skull base and vertebrae: Multilevel moderate to severe degenerative changes of the spine.  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 No severe osseous central canal stenosis. No acute fracture. No aggressive appearing focal osseous lesion or focal pathologic process. Soft tissues and spinal canal: No prevertebral fluid or swelling. No visible canal hematoma. Upper chest: Unremarkable. Other: None. IMPRESSION: 1. A 4 mm hyperdense foci left of the falx cerebri. Finding could represent a trace developing subdural or subarachnoid hemorrhage. 2. No acute displaced facial fracture. 3. No acute displaced fracture or traumatic listhesis of the cervical spine. 4. Multilevel moderate to severe degenerative changes of the spine. 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 Electronically Signed: By: Morgane  Naveau M.D. On: 01/08/2024 13:42   DG Tibia/Fibula Right Addendum Date: 01/08/2024 ADDENDUM REPORT: 01/08/2024 13:56 ADDENDUM: Please note that the x-ray right tibia fibula 01/08/2024 was mistakenly attached the CT head, max face, C-spine. The findings of the CT head were discussed as below: These results were called by telephone at the time of interpretation on 01/08/2024 at 1:53 pm to provider Dr. Arno Lapidus, who verbally acknowledged these results. --------------------------------------------------------------------------- -------------------- Please find read of the x-ray right tibia fibula below: CLINICAL DATA: Fall. RIGHT TIBIA AND FIBULA - 3 VIEW COMPARISON: None. FINDINGS: No evidence of fracture, dislocation, or joint effusion. No evidence of severe arthropathy. No aggressive appearing focal bone abnormality. Ankle and knee grossly unremarkable. Vascular calcifications. Soft tissues are unremarkable. IMPRESSION: No acute displaced fracture or dislocation. These results were called by telephone at the time of interpretation on 01/08/2024 at 1:53 pm to provider Dr. Arno Lapidus, who verbally acknowledged these results. Electronically Signed   By: Morgane  Naveau M.D.    On: 01/08/2024 13:56   Result Date: 01/08/2024 CLINICAL DATA:  Head trauma, minor (Age >= 65y) on coumadin ; fall, leg pain; Facial trauma, blunt; Neck trauma (Age >= 65y) EXAM: CT HEAD WITHOUT CONTRAST CT MAXILLOFACIAL WITHOUT CONTRAST CT CERVICAL SPINE WITHOUT CONTRAST TECHNIQUE: Multidetector CT imaging of the head, cervical spine, and maxillofacial structures were performed using the standard protocol without intravenous contrast. Multiplanar CT image reconstructions of the cervical spine and maxillofacial structures were also generated. RADIATION DOSE REDUCTION: This exam was performed according to the departmental dose-optimization program which includes automated exposure control, adjustment of the mA and/or kV according to patient size and/or use of iterative reconstruction technique. COMPARISON:  CT head 12/04/2015 FINDINGS: CT HEAD FINDINGS Brain: Trace patchy and confluent areas of decreased attenuation are noted throughout the deep and periventricular white matter of the cerebral hemispheres bilaterally, compatible with chronic microvascular ischemic disease. No evidence of large-territorial acute infarction. No parenchymal hemorrhage. No mass lesion. A 4 mm hyperdense foci left of the falx cerebri (1:18, 4:55). No mass effect or midline shift. No hydrocephalus. Basilar cisterns are patent. Vascular: No hyperdense vessel. Skull: No acute fracture or focal lesion. Other: 7 mm midline frontal scalp hematoma. CT MAXILLOFACIAL FINDINGS Osseous: No fracture or mandibular dislocation. No destructive process. Sinuses/Orbits: Complete opacification of left maxillary sinus. Otherwise paranasal sinuses and mastoid air cells are clear. Bilateral lens replacement. Otherwise the orbits are unremarkable. Soft tissues: Negative. CT CERVICAL SPINE FINDINGS Alignment: Grade 1 anterolisthesis of C2 on C3 and C3 on C4. Skull base and vertebrae: Multilevel moderate to severe degenerative changes of the spine. Associated  multilevel severe osseous neural foraminal stenosis. No severe osseous central canal stenosis. No acute fracture. No aggressive  appearing focal osseous lesion or focal pathologic process. Soft tissues and spinal canal: No prevertebral fluid or swelling. No visible canal hematoma. Upper chest: Unremarkable. Other: None. IMPRESSION: 1. A 4 mm hyperdense foci left of the falx cerebri. Finding could represent a trace developing subdural or subarachnoid hemorrhage. 2. No acute displaced facial fracture. 3. No acute displaced fracture or traumatic listhesis of the cervical spine. 4. Multilevel moderate to severe degenerative changes of the spine. 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 Electronically Signed: By: Morgane  Naveau M.D. On: 01/08/2024 13:42     Assessment/Plan: Very pleasant 83 year old who was on coumadin  for afib fell off of a ladder yesterday. CT head showed a very small SDH along the right tentorium. Follow up head CT this morning is stable. I think it is ok to discharge him back home today. Would recommend staying off of coumadin  permanently as I think the risks outweigh the benefits.   Don Medina 01/09/2024 12:18 PM

## 2024-01-09 NOTE — TOC CAGE-AID Note (Signed)
 Transition of Care [facility]) - CAGE-AID Screening   Patient Details  Name: Don Medina MRN: [id number] Date of Birth: 28-Dec-1940  Transition of Care [facility]) CM/SW Contact:    Arcola Freshour E Ladan Vanderzanden, LCSW Phone Number: 01/09/2024, 8:41 AM   Clinical Narrative:    CAGE-AID Screening:    Have You Ever Felt You Ought to Cut Down on Your Drinking or Drug Use?: No Have People Annoyed You By [employer] Your Drinking Or Drug Use?: No Have You Felt Bad Or Guilty About Your Drinking Or Drug Use?: No Have You Ever Had a Drink or Used Drugs First Thing In The Morning to Steady Your Nerves or to Get Rid of a Hangover?: No CAGE-AID Score: 0  Substance Abuse Education Offered: No

## 2024-01-09 NOTE — Plan of Care (Signed)
  Problem: Education: Goal: Knowledge of General Education information will improve Description: Including pain rating scale, medication(s)/side effects and non-pharmacologic comfort measures Outcome: Progressing   Problem: Health Behavior/Discharge Planning: Goal: Ability to manage health-related needs will improve Outcome: Progressing   Problem: Clinical Measurements: Goal: Diagnostic test results will improve Outcome: Progressing Goal: Cardiovascular complication will be avoided Outcome: Progressing   Problem: Activity: Goal: Risk for activity intolerance will decrease Outcome: Progressing   Problem: Pain Managment: Goal: General experience of comfort will improve and/or be controlled Outcome: Progressing

## 2024-01-10 ENCOUNTER — Encounter: Payer: Self-pay | Admitting: Cardiovascular Disease

## 2024-01-10 ENCOUNTER — Encounter: Payer: Self-pay | Admitting: Family Medicine

## 2024-01-10 ENCOUNTER — Telehealth: Payer: Self-pay

## 2024-01-10 NOTE — Telephone Encounter (Signed)
 Loyde Rule, MD to Zeb Heys, RN  Me (Selected Message)   01/10/24 11:23 AM He's been in NSR and has scalp hematoma and ICH. Would stop coumadin  indefinitely Can f/u with me or PA in 4-6 weeks may consider ILR to monitor for PAF  Called patient back to let him know of Dr. Francie Irani advisement. Made patient an appointment in July with APP.

## 2024-01-10 NOTE — Transitions of Care (Post Inpatient/ED Visit) (Signed)
   01/10/2024  Name: Don Medina MRN: [id number] DOB: [birth year]  Today's TOC FU Call Status: Today's TOC FU Call Status:: Successful TOC FU Call Completed TOC FU Call Complete Date: 01/10/24 Patient's Name and Date of Birth confirmed.  Transition Care Management Follow-up Telephone Call Date of Discharge: 01/09/24 Discharge Facility: [facility]) Type of Discharge: Inpatient Admission Primary Inpatient Discharge Diagnosis:: laceration of head How have you been since you were released from the hospital?: Better Any questions or concerns?: No  Items Reviewed: Did you receive and understand the discharge instructions provided?: Yes Medications obtained,verified, and reconciled?: Yes (Medications Reviewed) Any new allergies since your discharge?: No Dietary orders reviewed?: Yes Do you have support at home?: Yes People in Home [RPT]: spouse  Medications Reviewed Today: Medications Reviewed Today     Reviewed by Darrall Ellison, LPN (Licensed Practical Nurse) on 01/10/24 at 1016  Med List Status: <None>   Medication Order Taking? Sig Documenting Provider Last Dose Status Informant  cephALEXin (KEFLEX) 500 MG capsule [id number]  Take 1 capsule (500 mg total) by mouth every 8 (eight) hours for 5 days. Elgergawy, Ardia Kraft, MD  Active   citalopram  (CELEXA ) 20 MG tablet [id number] No TAKE 1 TABLET BY MOUTH EVERY DAY Burchette, Marijean Shouts, MD Taking Active   folic acid (FOLVITE) 1 MG tablet [id number] No Take 1 tablet by mouth daily. [provider] Taking Active   hydrochlorothiazide  (MICROZIDE ) 12.5 MG capsule [id number]  TAKE 1 CAPSULE BY MOUTH EVERY DAY Burchette, Marijean Shouts, MD  Active   metoprolol  tartrate (LOPRESSOR ) 25 MG tablet [id number] No TAKE 1/2 TABLET TWICE A DAY BY MOUTH Loyde Rule, MD Taking Active   Misc Natural Products (GLUCOS-CHONDROIT-MSM COMPLEX) TABS [id number] No Take 1 tablet by mouth 3 (three) times daily. [provider] Taking Active  Self  Omega-3 Fatty Acids (FISH OIL) 1000 MG CAPS [id number] No Take 1 capsule by mouth daily. [provider] Taking Active   simvastatin  (ZOCOR ) 20 MG tablet [id number] No Take 1 tablet (20 mg total) by mouth at bedtime. Marquetta Sit, MD Taking Active             Home Care and Equipment/Supplies: Were Home Health Services Ordered?: NA Any new equipment or medical supplies ordered?: NA  Functional Questionnaire: Do you need assistance with bathing/showering or dressing?: No Do you need assistance with meal preparation?: No Do you need assistance with eating?: No Do you have difficulty maintaining continence: No Do you need assistance with getting out of bed/getting out of a chair/moving?: No Do you have difficulty managing or taking your medications?: No  Follow up appointments reviewed: PCP Follow-up appointment confirmed?: Yes Date of PCP follow-up appointment?: 01/17/24 Follow-up Provider: [facility] Follow-up appointment confirmed?: No Reason Specialist Follow-Up Not Confirmed: Patient has Specialist Provider Number and will Call for Appointment Do you need transportation to your follow-up appointment?: No Do you understand care options if your condition(s) worsen?: Yes-patient verbalized understanding    SIGNATURE Darrall Ellison, LPN [facility] Nurse Health Advisor Direct Dial [phone]

## 2024-01-16 ENCOUNTER — Ambulatory Visit

## 2024-01-16 NOTE — Telephone Encounter (Signed)
 Review of pt chart due to him being scheduled for coumadin  clinic today but pt was taken off of schedule.  Pt had fall on 6/1 which resulted in small intercranial hemorrhage. He was taken off of warfarin at that time. Pt wanted to stay off of warfarin so he contacted cardiologist. Per Dr. Stann Earnest,   "He's been in NSR and has scalp hematoma and ICH. Would stop coumadin  indefinitely Can f/u with me or PA in 4-6 weeks may consider ILR to monitor for PAF."   Pt has f/u apt with PCP tomorrow. Forwarding msg to PCP.

## 2024-01-17 ENCOUNTER — Encounter: Payer: Self-pay | Admitting: Family Medicine

## 2024-01-17 ENCOUNTER — Ambulatory Visit: Admitting: Family Medicine

## 2024-01-17 VITALS — BP 130/60 | HR 55 | Temp 97.7°F | Wt 172.0 lb

## 2024-01-17 DIAGNOSIS — I10 Essential (primary) hypertension: Secondary | ICD-10-CM

## 2024-01-17 DIAGNOSIS — Z4802 Encounter for removal of sutures: Secondary | ICD-10-CM | POA: Diagnosis not present

## 2024-01-17 DIAGNOSIS — S0181XA Laceration without foreign body of other part of head, initial encounter: Secondary | ICD-10-CM | POA: Diagnosis not present

## 2024-01-17 DIAGNOSIS — I629 Nontraumatic intracranial hemorrhage, unspecified: Secondary | ICD-10-CM

## 2024-01-17 DIAGNOSIS — I4891 Unspecified atrial fibrillation: Secondary | ICD-10-CM

## 2024-01-17 DIAGNOSIS — S0181XD Laceration without foreign body of other part of head, subsequent encounter: Secondary | ICD-10-CM

## 2024-01-17 NOTE — Patient Instructions (Signed)
 Keep follow up with Cardiology to discuss whether to re-initiate the Coumadin .

## 2024-01-17 NOTE — Progress Notes (Signed)
 Established Patient Office Visit  Subjective   Patient ID: Don Medina, male    DOB: [birth year]  Age: 83 y.o. MRN: [id number]  Chief Complaint  Patient presents with   Hospitalization Follow-up    HPI   Don Medina is seen for hospital follow-up following the recent fall from a ladder in his house with head injury.  He has history of atrial fibrillation, hypertension, GERD, history of PVCs, history of aortic dissection repair, hyperlipidemia, chronic mild thrombocytopenia.  He stays very active in general with exercise and home maintenance.  He was on a ladder leaning against his house and underneath the ladder was a very large flowerpot.  The lower part of the ladder slipped out and he basically fell face first onto the ladder and the flowerpot.  He sustained fairly long laceration right cheek area and also left lower lip region.  No loss of consciousness.  He initially went to [facility] and was subsequently transferred to [facility] for overnight stay.  Had extensive imaging including CT head, CT cervical spine, CT maxillofacial and plain films right tib-fib.  X-rays were unremarkable except for 4 mm hyperdense foci left of the falx cerebri felt to represent possible trace subdural or subarachnoid hemorrhage.  He was reportedly given vitamin K in Coumadin  held.  Follow-up CT head following day showed trace left cingulate region subarachnoid blood stable with trace subdural blood layering on the right tentorium.  Remains off Coumadin  at this time.  He had abrasions of the right shin and left shin which are healing without complication.  Needs sutures removed from right side of face today and left face as well.  Total of 13 sutures.  To his knowledge he has not been in A-fib since 2018.  This occurred after his extensive cardiac surgery.  His cardiologist back in 2024 had recommended consideration for chronic Coumadin  given his history of PAF, history of popliteal aneurysm, and history of  thrombosis of left subclavian vein.  He does have follow-up with cardiologist in a few weeks to further discuss.  Denies any headaches at this time.  No dizziness.  Back to his usual activities.  Does have extensive facial bruising.  No chest pains.  Past Medical History:  Diagnosis Date   ADJ DISORDER WITH MIXED ANXIETY & DEPRESSED MOOD 09/08/2009   Qualifier: Diagnosis of  By: Darren Em MD, Reyah Streeter     Arthritis    Atrial fibrillation (HCC) [I48.91] 05/23/2017   Blood in the stool    BRADYCARDIA 03/10/2010   Qualifier: Diagnosis of  By: Darren Em MD, Saadiq Poche     CHEST PAIN 03/19/2010   Qualifier: Diagnosis of  By: York, LPN, Christine     Chicken pox    COLONIC POLYPS, HX OF 01/13/2009   Qualifier: Diagnosis of  By: Ivy Marseilles LPN, Nancy     Episode of generalized weakness 02/17/2016   Essential hypertension 03/15/2022   GERD (gastroesophageal reflux disease) 10/03/2012   Hay fever    History of colonic polyps    History of colonoscopy    Hyperlipidemia    MUSCLE STRAIN, HAMSTRING MUSCLE 09/08/2009   Qualifier: Diagnosis of  By: Darren Em MD, Carman Chimera DIZZINESS 03/05/2010   Qualifier: Diagnosis of  By: Darren Em MD, Keian Odriscoll     Other premature beats 03/17/2010   Qualifier: Diagnosis of  By: Carolynne Citron, MD, Norva Beecham    Premature ventricular contraction 01/05/2011   S/P aortic dissection repair 05/08/2017   THROMBOCYTOPENIA 03/05/2010   Qualifier: Diagnosis  of  By: Darren Em MD, Jahkeem Kurka     Weakness 12/09/2015   Past Surgical History:  Procedure Laterality Date   ABDOMINAL ADHESION SURGERY  2007   AORTIC INTERVENTION N/A 05/08/2017   Procedure: HYPOTHERMIC CIRCULATORY ARREST;  Surgeon: Norita Beauvais, MD;  Location: [facility] OR;  Service: Open Heart Surgery;  Laterality: N/A;   ASCENDING AORTIC ROOT REPLACEMENT N/A 05/08/2017   Procedure: REPLACEMENT OF ASCENDING AORTIC ARCH;  Surgeon: Norita Beauvais, MD;  Location[facility] OR;  Service: Open Heart Surgery;  Laterality: N/A;   CANNULATION  FOR CARDIOPULMONARY BYPASS Right 05/08/2017   Procedure: AXILLARY CANNULATION;  Surgeon: Norita Beauvais, MD;  Location: [facility] OR;  Service: Open Heart Surgery;  Laterality: Right;   ENDOVASCULAR REPAIR/STENT GRAFT  05/08/2017   Procedure: STENT OF LEFT SUBCLAVIAN ARTERY with 10x39VBX;  Surgeon: Norita Beauvais, MD;  Location: [facility] OR;  Service: Open Heart Surgery;;   herniated diks surgery L-5  1996   herniated disk surgery c 6-7  1986   INTRAOPERATIVE TRANSESOPHAGEAL ECHOCARDIOGRAM N/A 05/08/2017   Procedure: INTRAOPERATIVE TRANSESOPHAGEAL ECHOCARDIOGRAM;  Surgeon: Norita Beauvais, MD;  Location: [facility] OR;  Service: Open Heart Surgery;  Laterality: N/A;   REPLACEMENT ASCENDING AORTA N/A 05/08/2017   Procedure: REPLACEMENT ASCENDING AORTA;  Surgeon: Norita Beauvais, MD;  Location: [facility] OR;  Service: Open Heart Surgery;  Laterality: N/A;   THORACIC AORTIC ENDOVASCULAR STENT GRAFT  05/08/2017   Procedure: STENT GRAFTING OF DESCENDING THORACIC AORTA;  Surgeon: Norita Beauvais, MD;  Location: [facility] OR;  Service: Open Heart Surgery;;    reports that he quit smoking about 49 years ago. His smoking use included cigarettes. He has never used smokeless tobacco. He reports that he does not drink alcohol and does not use drugs. family history includes Alzheimer's disease (age of onset: 27) in his father; Cancer in his brother; Dementia in his father; Hypertension in an other family member; Stroke (age of onset: 72) in his mother; Sudden death in an other family member. 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 Recent studies have raised concern that fluoroquinolone antibiotics could be associated with an increased risk of aortic aneurysm Fluoroquinolones have non-antimicrobial properties that might jeopardise the integrity of the extracellular matrix of the vascular  wall In a  propensity score matched cohort study in [place], there was a 66% increased rate of aortic aneurysm or dissection associated with oral fluoroquinolone use, compared wit    Review of Systems  Constitutional:  Negative for chills and fever.  Respiratory:  Negative for shortness of breath.   Cardiovascular:  Negative for chest pain.  Gastrointestinal:  Negative for abdominal pain.  Neurological:  Negative for dizziness, focal weakness, loss of consciousness and headaches.      Objective:     BP 130/60 (BP Location: Left Arm, Patient Position: Sitting, Cuff Size: Normal)   Pulse (!) 55   Temp 97.7 F (36.5 C) (Oral)   Wt 172 lb (78 kg)   SpO2 96%   BMI 26.15 kg/m  BP Readings from Last 3 Encounters:  01/17/24 130/60  01/09/24 118/73  11/07/23 124/60   Wt Readings from Last 3 Encounters:  01/17/24 172 lb (78 kg)  01/08/24 175 lb (79.4 kg)  11/07/23 176 lb 12.8 oz (80.2 kg)      Physical Exam Vitals reviewed.  Constitutional:  General: He is not in acute distress.    Appearance: He is not ill-appearing.  HENT:     Head:     Comments: Extensive bruising involving the face.  He has small hematoma on the frontal region to the midline.  No erythema.  Healing laceration right cheek region and also left lower lip Cardiovascular:     Rate and Rhythm: Normal rate and regular rhythm.  Pulmonary:     Effort: Pulmonary effort is normal.     Breath sounds: Normal breath sounds. No wheezing or rales.  Musculoskeletal:     Right lower leg: No edema.     Left lower leg: No edema.  Neurological:     General: No focal deficit present.     Mental Status: He is alert and oriented to person, place, and time.     Cranial Nerves: No cranial nerve deficit.     Motor: No weakness.     Gait: Gait normal.      No results found for any visits on 01/17/24.  Last CBC Lab Results  Component Value Date   WBC 9.2 01/09/2024   HGB 13.4 01/09/2024   HCT 37.3 (L) 01/09/2024    MCV 89.2 01/09/2024   MCH 32.1 01/09/2024   RDW 12.8 01/09/2024   PLT 113 (L) 01/09/2024   Last metabolic panel Lab Results  Component Value Date   GLUCOSE 111 (H) 01/09/2024   NA 136 01/09/2024   K 3.5 01/09/2024   CL 102 01/09/2024   CO2 27 01/09/2024   BUN 16 01/09/2024   CREATININE 1.10 01/09/2024   GFRNONAA >60 01/09/2024   CALCIUM  8.6 (L) 01/09/2024   PROT 6.8 09/14/2023   ALBUMIN  3.9 09/14/2023   BILITOT 0.5 09/14/2023   ALKPHOS 53 09/14/2023   AST 24 09/14/2023   ALT 13 09/14/2023   ANIONGAP 7 01/09/2024   Last lipids Lab Results  Component Value Date   CHOL 131 09/14/2023   HDL 48.30 09/14/2023   LDLCALC 62 09/14/2023   TRIG 102.0 09/14/2023   CHOLHDL 3 09/14/2023   Last hemoglobin A1c Lab Results  Component Value Date   HGBA1C 5.9 (H) 01/08/2024      The ASCVD Risk score (Arnett DK, et al., 2019) failed to calculate for the following reasons:   The 2019 ASCVD risk score is only valid for ages 34 to 104    Assessment & Plan:   #1 facial lacerations following recent fall.  Total of 13 sutures were removed.  He had some thick eschar and we had to apply some hydroperoxide topically to soften the eschar prior to removing few of the sutures.  No signs of secondary infection.  He has extensive facial bruising which should be resolving over the next couple weeks  #2 recent small subdural in a patient who had been on Coumadin  at the time of his fall.  Coumadin  is being held indefinitely this time.  He has sent a message to cardiology and has follow-up with them soon.  They will discuss possible internal loop recorder to assess for recurrent A-fib.  #3 hypertension currently stable.  Patient on low-dose metoprolol  and hydrochlorothiazide   #4 history of paroxysmal atrial fibrillation 2018 with no known recurrence since then.  Continue follow-up with cardiology to discuss further management now that he is off Coumadin   #5 trace subarachnoid and subdural bleeds  in a patient on Coumadin  following recent fall.  Now off Coumadin .  Has no headaches.  No dizziness.  Nonfocal neuroexam.  Glean Lamy, MD

## 2024-01-22 ENCOUNTER — Other Ambulatory Visit: Payer: Self-pay | Admitting: Family Medicine

## 2024-02-23 ENCOUNTER — Ambulatory Visit: Attending: Nurse Practitioner | Admitting: Nurse Practitioner

## 2024-02-23 ENCOUNTER — Encounter: Payer: Self-pay | Admitting: Nurse Practitioner

## 2024-02-23 VITALS — BP 116/50 | HR 54 | Ht 68.0 in | Wt 171.6 lb

## 2024-02-23 DIAGNOSIS — I10 Essential (primary) hypertension: Secondary | ICD-10-CM

## 2024-02-23 DIAGNOSIS — I9789 Other postprocedural complications and disorders of the circulatory system, not elsewhere classified: Secondary | ICD-10-CM | POA: Diagnosis not present

## 2024-02-23 DIAGNOSIS — Z8679 Personal history of other diseases of the circulatory system: Secondary | ICD-10-CM

## 2024-02-23 DIAGNOSIS — I4891 Unspecified atrial fibrillation: Secondary | ICD-10-CM | POA: Diagnosis not present

## 2024-02-23 DIAGNOSIS — E785 Hyperlipidemia, unspecified: Secondary | ICD-10-CM | POA: Diagnosis not present

## 2024-02-23 NOTE — Progress Notes (Signed)
 Office Visit    Patient Name: Don Medina Date of Encounter: 02/23/2024  Primary Care Provider:  Micheal Wolm ORN, MD Primary Cardiologist:  Maude Emmer, MD  Chief Complaint    83 year old male with a history of type A aortic dissection s/p repair, postop atrial fibrillation, hypertension, hyperlipidemia, type 2 diabetes, and subdural hematoma in the setting of mechanical fall who presents for follow-up related to atrial fibrillation.  Past Medical History    Past Medical History:  Diagnosis Date   ADJ DISORDER WITH MIXED ANXIETY & DEPRESSED MOOD 09/08/2009   Qualifier: Diagnosis of  By: Micheal MD, Bruce     Arthritis    Atrial fibrillation (HCC) [I48.91] 05/23/2017   Blood in the stool    BRADYCARDIA 03/10/2010   Qualifier: Diagnosis of  By: Micheal MD, Bruce     CHEST PAIN 03/19/2010   Qualifier: Diagnosis of  By: York, LPN, Christine     Chicken pox    COLONIC POLYPS, HX OF 01/13/2009   Qualifier: Diagnosis of  By: Lavinia LPN, Nancy     Episode of generalized weakness 02/17/2016   Essential hypertension 03/15/2022   GERD (gastroesophageal reflux disease) 10/03/2012   Hay fever    History of colonic polyps    History of colonoscopy    Hyperlipidemia    MUSCLE STRAIN, HAMSTRING MUSCLE 09/08/2009   Qualifier: Diagnosis of  By: Micheal MD, Wolm DRAIN DIZZINESS 03/05/2010   Qualifier: Diagnosis of  By: Micheal MD, Bruce     Other premature beats 03/17/2010   Qualifier: Diagnosis of  By: Waddell, MD, CODY Danelle Elsie    Premature ventricular contraction 01/05/2011   S/P aortic dissection repair 05/08/2017   THROMBOCYTOPENIA 03/05/2010   Qualifier: Diagnosis of  By: Micheal MD, Bruce     Weakness 12/09/2015   Past Surgical History:  Procedure Laterality Date   ABDOMINAL ADHESION SURGERY  2007   AORTIC INTERVENTION N/A 05/08/2017   Procedure: HYPOTHERMIC CIRCULATORY ARREST;  Surgeon: Army Dallas NOVAK, MD;  Location: [facility] OR;  Service: Open Heart  Surgery;  Laterality: N/A;   ASCENDING AORTIC ROOT REPLACEMENT N/A 05/08/2017   Procedure: REPLACEMENT OF ASCENDING AORTIC ARCH;  Surgeon: Army Dallas NOVAK, MD;  Location[facility] OR;  Service: Open Heart Surgery;  Laterality: N/A;   CANNULATION FOR CARDIOPULMONARY BYPASS Right 05/08/2017   Procedure: AXILLARY CANNULATION;  Surgeon: Army Dallas NOVAK, MD;  Location: [facility] OR;  Service: Open Heart Surgery;  Laterality: Right;   ENDOVASCULAR REPAIR/STENT GRAFT  05/08/2017   Procedure: STENT OF LEFT SUBCLAVIAN ARTERY with 10x39VBX;  Surgeon: Army Dallas NOVAK, MD;  Location: [facility] OR;  Service: Open Heart Surgery;;   herniated diks surgery L-5  1996   herniated disk surgery c 6-7  1986   INTRAOPERATIVE TRANSESOPHAGEAL ECHOCARDIOGRAM N/A 05/08/2017   Procedure: INTRAOPERATIVE TRANSESOPHAGEAL ECHOCARDIOGRAM;  Surgeon: Army Dallas NOVAK, MD;  Location: [facility] OR;  Service: Open Heart Surgery;  Laterality: N/A;   REPLACEMENT ASCENDING AORTA N/A 05/08/2017   Procedure: REPLACEMENT ASCENDING AORTA;  Surgeon: Army Dallas NOVAK, MD;  Location: [facility] OR;  Service: Open Heart Surgery;  Laterality: N/A;   THORACIC AORTIC ENDOVASCULAR STENT GRAFT  05/08/2017   Procedure: STENT GRAFTING OF DESCENDING THORACIC AORTA;  Surgeon: Army Dallas NOVAK, MD;  Location: [facility] OR;  Service: Open Heart Surgery;;    Allergies  Allergies  Allergen Reactions   Heparin      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 Recent studies have raised concern that fluoroquinolone antibiotics could be associated with an increased risk of aortic aneurysm Fluoroquinolones have non-antimicrobial properties that might jeopardise the integrity of the extracellular matrix of the vascular wall In a  propensity score matched cohort study in [place], there was a 66% increased rate of aortic aneurysm or dissection associated with oral fluoroquinolone use, compared wit      Labs/Other Studies Reviewed    The following studies were reviewed today:  Cardiac Studies & Procedures   ______________________________________________________________________________________________     ECHOCARDIOGRAM  ECHOCARDIOGRAM COMPLETE 11/29/2019  Narrative ECHOCARDIOGRAM REPORT    Patient Name:   Don Medina Date of Exam: 11/29/2019 Medical Rec #:  [id number]               Height:       68.0 in Accession #:    [id number]              Weight:       183.0 lb Date of Birth:  [birth year]               BSA:          1.968 m Patient Age:    78 years                BP:           128/68 mmHg Patient Gender: M                       HR:           57 bpm. Exam Location:  [address]  Procedure: 2D Echo, 3D Echo, Cardiac Doppler, Color Doppler and Strain Analysis  Indications:    Z98.89 S/p aortic dissection repair. I95.1 Orthostatic hypotension.  History:        Patient has prior history of Echocardiogram examinations, most recent 04/11/2018. Arrythmias:Atrial Fibrillation, PVC and Bradycardia.; Risk Factors:Dyslipidemia. Aortic dissection s/p repair 05/2017.  Sonographer:    Marshia Rea RAMAN, RDCS Referring Phys: [id number] MAUDE JAYSON EMMER  IMPRESSIONS   1. Normal GLS - 20.1 . Left ventricular ejection fraction, by estimation, is 60 to 65%. The left ventricle has normal function. The left ventricle has no regional wall motion abnormalities. Left ventricular diastolic parameters were normal. 2. Right ventricular systolic function is normal. The right ventricular size is normal. 3. Left atrial size was moderately dilated. 4. The mitral valve is normal in structure. No evidence of mitral valve regurgitation. No evidence of mitral stenosis. 5. The aortic valve is tricuspid. Aortic valve regurgitation is trivial. No aortic stenosis is present. 6. Post type A dissection with repair no residual intimal defect noted and normal aortic root size. 7. The inferior vena cava  is normal in size with greater than 50% respiratory variability, suggesting right atrial pressure of 3 mmHg.  Comparison(s): 04/11/18 EF 55-60%.  FINDINGS Left Ventricle: Normal GLS - 20.1. Left ventricular ejection fraction, by estimation, is 60 to 65%. The left ventricle has normal function. The left ventricle has no regional wall motion abnormalities. The left ventricular internal cavity size was normal in size. There is no left ventricular hypertrophy. Left ventricular diastolic parameters were normal.  Right Ventricle: The right ventricular size is normal. No increase in right ventricular wall thickness. Right ventricular systolic function is normal.  Left Atrium: Left atrial size was moderately dilated.  Right Atrium: Right atrial size was normal in size.  Pericardium:  There is no evidence of pericardial effusion.  Mitral Valve: The mitral valve is normal in structure. Normal mobility of the mitral valve leaflets. No evidence of mitral valve regurgitation. No evidence of mitral valve stenosis.  Tricuspid Valve: The tricuspid valve is normal in structure. Tricuspid valve regurgitation is not demonstrated. No evidence of tricuspid stenosis.  Aortic Valve: The aortic valve is tricuspid. Aortic valve regurgitation is trivial. Aortic regurgitation PHT measures 527 msec. No aortic stenosis is present. Aortic valve mean gradient measures 4.0 mmHg. Aortic valve peak gradient measures 6.8 mmHg. Aortic valve area, by VTI measures 2.72 cm.  Pulmonic Valve: The pulmonic valve was normal in structure. Pulmonic valve regurgitation is not visualized. No evidence of pulmonic stenosis.  Aorta: Post type A dissection with repair no residual intimal defect noted and normal aortic root size. The aortic root is normal in size and structure.  Venous: The inferior vena cava is normal in size with greater than 50% respiratory variability, suggesting right atrial pressure of 3 mmHg.  IAS/Shunts: No atrial  level shunt detected by color flow Doppler.   LEFT VENTRICLE PLAX 2D LVIDd:         4.40 cm  Diastology LVIDs:         3.00 cm  LV e' lateral:   8.70 cm/s LV PW:         1.00 cm  LV E/e' lateral: 5.4 LV IVS:        1.10 cm  LV e' medial:    4.90 cm/s LVOT diam:     2.00 cm  LV E/e' medial:  9.6 LV SV:         84 LV SV Index:   42       2D Longitudinal Strain LVOT Area:     3.14 cm 2D Strain GLS (A2C):   -21.5 % 2D Strain GLS (A3C):   -18.5 % 2D Strain GLS (A4C):   -20.5 % 2D Strain GLS Avg:     -20.1 %  3D Volume EF: 3D EF:        62 % LV EDV:       121 ml LV ESV:       46 ml LV SV:        76 ml  RIGHT VENTRICLE RV Basal diam:  3.90 cm RV S prime:     8.76 cm/s TAPSE (M-mode): 1.3 cm  LEFT ATRIUM             Index       RIGHT ATRIUM           Index LA diam:        4.50 cm 2.29 cm/m  RA Pressure: 3.00 mmHg LA Vol (A2C):   39.8 ml 20.22 ml/m RA Area:     21.20 cm LA Vol (A4C):   27.4 ml 13.92 ml/m RA Volume:   57.70 ml  29.32 ml/m LA Biplane Vol: 33.6 ml 17.07 ml/m AORTIC VALVE AV Area (Vmax):    2.71 cm AV Area (Vmean):   2.50 cm AV Area (VTI):     2.72 cm AV Vmax:           130.00 cm/s AV Vmean:          90.600 cm/s AV VTI:            0.308 m AV Peak Grad:      6.8 mmHg AV Mean Grad:      4.0 mmHg LVOT Vmax:  112.00 cm/s LVOT Vmean:        72.000 cm/s LVOT VTI:          0.266 m LVOT/AV VTI ratio: 0.87 AI PHT:            527 msec  AORTA Ao Root diam: 4.20 cm Ao Asc diam:  3.30 cm  MITRAL VALVE               TRICUSPID VALVE Estimated RAP:  3.00 mmHg  MV E velocity: 46.80 cm/s  SHUNTS MV A velocity: 44.10 cm/s  Systemic VTI:  0.27 m MV E/A ratio:  1.06        Systemic Diam: 2.00 cm  Maude Emmer MD Electronically signed by Maude Emmer MD Signature Date/Time: 11/29/2019/9:59:07 AM    Final   TEE  ECHO INTRAOPERATIVE TEE 05/08/2017  Interpretation Summary  Left ventricle: Normal cavity size and wall thickness. LV systolic function is  normal with an EF of 55-60%. There are no obvious wall motion abnormalities.  Aortic valve: The valve is trileaflet. Severe regurgitation.  Aorta: The ascending aorta is dilated. 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  Right ventricle: Cavity is moderately to severely dilated. Moderately reduced systolic function.  Right atrium: Cavity is dilated.  Aorta: Graft present in the ascending aorta.  MONITORS  CARDIAC EVENT MONITOR 12/30/2015  Narrative  NSR  occasional PVCs.  episodes of sinus tach associated with exercise   CT SCANS  CT CORONARY MORPH W/CTA COR W/SCORE 06/29/2017  Addendum 06/29/2017  2:05 PM ADDENDUM REPORT: 06/29/2017 14:03  CLINICAL DATA:  Post Type A Dissection  EXAM: Cardiac CTA  MEDICATIONS: Sub lingual nitro. 4mg  and lopressor  5mg   TECHNIQUE: The patient was scanned on a Siemens 192 slice scanner. Gantry rotation speed was 250 msecs. Collimation was . 6 mm . A 100 kV prospective scan was supposed to be triggered in the ascending aorta but appears to have started early with ROI in SVC. The 3D data set was interpreted on a dedicated work station using MPR, MIP and VRT modes. A total of 80cc of contrast was used.  FINDINGS: Non-cardiac: See separate report from [facility]. No significant findings on limited lung and soft tissue windows.  Coronary Arteries: Right dominant with no anomalies  LM:  Less than 30% calcified disease in distal vessel  LAD:  Less than 50% calcified disease in proximal and mid vessel  D1:  Less than 30% calcified disease in ostial vessel  D2:  Less than 30% calcified disease in ostial vessel  Circumflex: Appears anomalous from RCA Courses behind aorta with no significant stenosis  OM1:  Normal  RCA:  Less than 30% calcified disease in proximal and mid RCA  PDA:  Normal  PLA:  Normal  IMPRESSION: 1) No hemodynamically significant CAD. 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 On: 06/29/2017 14:03  Narrative EXAM: OVER-READ INTERPRETATION  CT CHEST  The following report is an over-read performed by radiologist Dr. Franky Leff [facility] on 06/29/2017. This over-read does not include interpretation of cardiac or coronary anatomy or pathology. The coronary CTA interpretation by the cardiologist is attached.  COMPARISON:  CT 05/08/2017  FINDINGS: Vascular: Previously seen aortic dissection again noted in the descending thoracic aorta below the stent graft which extends  from the distal aortic arch into the mid descending thoracic aorta prior tube graft repair of the ascending thoracic aorta without dissection. Dissection flap again noted within the left common carotid artery, no longer visualized in the brachiocephalic artery. Stents noted within the left subclavian artery. There is mild cardiomegaly.  Mediastinum/Nodes: No mediastinal, hilar, or axillary adenopathy. Trachea and esophagus are unremarkable.  Lungs/Pleura: Moderate left pleural effusion. Minimal compressive atelectasis in the lower lobe. No confluent airspace opacity otherwise.  Upper Abdomen: Imaging into the upper abdomen shows no acute findings.  Musculoskeletal: Chest wall soft tissues are unremarkable. No acute bony abnormality.  IMPRESSION: Evidence of prior repair of the type day aortic dissection with tube graft repair in the ascending thoracic aorta and stent graft repair in the distal arch and descending thoracic aorta. The dissection flap again noted, extending from the distal aspect of the stent graft into the upper abdomen.  Prior repair of dissection within the brachycephalic artery and stenting of the left subclavian artery. Dissection flap  continues to be noted in the left carotid artery.  Moderate left pleural effusion with left lower lobe atelectasis.  Electronically Signed: By: Franky Crease M.D. On: 06/29/2017 10:22     ______________________________________________________________________________________________     Recent Labs: 09/14/2023: ALT 13 01/09/2024: BUN 16; Creatinine, Ser 1.10; Hemoglobin 13.4; Platelets 113; Potassium 3.5; Sodium 136  Recent Lipid Panel    Component Value Date/Time   CHOL 131 09/14/2023 0811   CHOL 128 12/09/2015 0941   TRIG 102.0 09/14/2023 0811   HDL 48.30 09/14/2023 0811   HDL 60 12/09/2015 0941   CHOLHDL 3 09/14/2023 0811   VLDL 20.4 09/14/2023 0811   LDLCALC 62 09/14/2023 0811   LDLCALC 76 05/22/2020 0937    History of Present Illness    83 year old male with the above past medical history including type A aortic dissection s/p repair, postop atrial fibrillation, hypertension, hyperlipidemia, type 2 diabetes, and subdural hematoma in the setting of mechanical fall.   He was hospitalized in September 2018 in the setting of type a aortic dissection.  He underwent repair of the dissection with replacement of the ascending aortic arch, ascending aorta using a 28x30 Hemashield Platinum Graft, stent grafting of the descending thoracic aorta, stenting of the descending thoracic aorta, stenting of the left subclavian, and debraching of the left carotid artery. Underwent repair of the aortic valve with re suspension.  He developed postop atrial fibrillation.  He was started on Coumadin  and amiodarone .  Coronary CT a in 06/2017 showed no significant CAD, stable repair.  Echocardiogram in 04/2020 showed EF 60 to 65%, normal LV function, no RWMA, normal RV systolic function, no significant valvular abnormalities, stable type a dissection repair.  He has a history of right popliteal aneurysm, followed by vascular surgery.  He was last seen in the office on 06/22/2023 and was stable from a cardiac  standpoint.  He was hospitalized in June 2025 in the setting of subdermal hematoma s/p mechanical fall (he fell off of the ladder).  He was advised to hold his warfarin indefinitely.  Consideration of loop recorder was recommended to screen for recurrence of atrial fibrillation.  He presents today for follow-up.  Since his last visit and since his recent hospitalization he has been stable from a cardiac standpoint.  He denies symptoms concerning for angina, denies any palpitations, dizziness, dyspnea, edema, PND, orthopnea, weight gain.  Overall, he reports feeling well.    Home Medications    Current Outpatient Medications  Medication  Sig Dispense Refill   citalopram  (CELEXA ) 20 MG tablet TAKE 1 TABLET BY MOUTH EVERY DAY 90 tablet 0   desonide (DESOWEN) 0.05 % cream Apply topically as needed.     folic acid  (FOLVITE ) 1 MG tablet Take 1 tablet by mouth daily.     hydrochlorothiazide  (MICROZIDE ) 12.5 MG capsule TAKE 1 CAPSULE BY MOUTH EVERY DAY 90 capsule 1   metoprolol  tartrate (LOPRESSOR ) 25 MG tablet TAKE 1/2 TABLET TWICE A DAY BY MOUTH 90 tablet 3   Misc Natural Products (GLUCOS-CHONDROIT-MSM COMPLEX) TABS Take 1 tablet by mouth 3 (three) times daily.     Omega-3 Fatty Acids (FISH OIL) 1000 MG CAPS Take 1 capsule by mouth daily.     simvastatin  (ZOCOR ) 20 MG tablet Take 1 tablet (20 mg total) by mouth at bedtime. 90 tablet 3   No current facility-administered medications for this visit.     Review of Systems    He denies chest pain, palpitations, dyspnea, pnd, orthopnea, n, v, dizziness, syncope, edema, weight gain, or early satiety. All other systems reviewed and are otherwise negative except as noted above.   Physical Exam    VS:  BP (!) 116/50   Pulse (!) 54   Ht 5' 8 (1.727 m)   Wt 171 lb 9.6 oz (77.8 kg)   SpO2 96%   BMI 26.09 kg/m   GEN: Well nourished, well developed, in no acute distress. HEENT: normal. Neck: Supple, no JVD, carotid bruits, or masses. Cardiac: RRR, no  murmurs, rubs, or gallops. No clubbing, cyanosis, edema.  Radials/DP/PT 2+ and equal bilaterally.  Respiratory:  Respirations regular and unlabored, clear to auscultation bilaterally. GI: Soft, nontender, nondistended, BS + x 4. MS: no deformity or atrophy. Skin: warm and dry, no rash. Neuro:  Strength and sensation are intact. Psych: Normal affect.  Accessory Clinical Findings    ECG personally reviewed by me today - EKG Interpretation Date/Time:  Thursday February 23 2024 15:22:28 EDT Ventricular Rate:  54 PR Interval:  152 QRS Duration:  104 QT Interval:  460 QTC Calculation: 436 R Axis:   77  Text Interpretation: Sinus bradycardia ST & T wave abnormality, consider anterolateral ischemia When compared with ECG of 22-Jun-2023 09:00, T wave inversion more evident in Anterior leads Confirmed by Daneen Perkins ([id number]) on 02/23/2024 3:32:35 PM  - no acute changes.   Lab Results  Component Value Date   WBC 9.2 01/09/2024   HGB 13.4 01/09/2024   HCT 37.3 (L) 01/09/2024   MCV 89.2 01/09/2024   PLT 113 (L) 01/09/2024   Lab Results  Component Value Date   CREATININE 1.10 01/09/2024   BUN 16 01/09/2024   NA 136 01/09/2024   K 3.5 01/09/2024   CL 102 01/09/2024   CO2 27 01/09/2024   Lab Results  Component Value Date   ALT 13 09/14/2023   AST 24 09/14/2023   ALKPHOS 53 09/14/2023   BILITOT 0.5 09/14/2023   Lab Results  Component Value Date   CHOL 131 09/14/2023   HDL 48.30 09/14/2023   LDLCALC 62 09/14/2023   TRIG 102.0 09/14/2023   CHOLHDL 3 09/14/2023    Lab Results  Component Value Date   HGBA1C 5.9 (H) 01/08/2024    Assessment & Plan    1. Postop atrial fibrillation: Occurred in the postop setting, no documented recurrence.  Previously on warfarin, however, this was recently discontinued in the setting of subdural hematoma s/p mechanical fall. We had a discussion about the risks and benefits of  anticoagulation therapy, possible loop recorder insertion versus 30-day  monitor. Patient states that he has had no problems taking Coumadin , however, he does express some concern over the risk for bleeding given recent SDH.  He is unaware of any breakthrough atrial fibrillation, however, he reports that he was not symptomatic at the time he was diagnosed. He would prefer that I reach out to Dr. Nishan for his recommendations as to whether or not loop recorder is indicated at this time, and whether or not he should resume warfarin  He does not wish to have a stroke.  If Dr. Delford recommends loop recorder, patient states he would be agreeable.  Continue metoprolol .  2. Type a aortic dissection: S/p repair with replacement of the ascending aortic arch, ascending aorta using a 28x30 Hemashield Platinum Graft, stent grafting of the descending thoracic aorta, stenting of the descending thoracic aorta, stenting of the left subclavian, and debraching of the left carotid artery. Underwent repair of the aortic valve with re suspension. Continue SBE prophylaxis.  Stable on most recent CT chest/aorta in 05/2023.  3. Hypertension: BP well controlled. Continue current antihypertensive regimen.   4. Hyperlipidemia: LDL was 62 in 09/2023.  Continue simvastatin .  5. Type 2 diabetes: A1c was 5.9 01/2024.  Monitored and managed by PCP.  6. Subdural hematoma: Occurred in the setting of mechanical fall.  Warfarin on hold as above.   7. Disposition: Follow-up in 4 months with Dr. Delford.       Damien JAYSON Braver, NP 02/23/2024, 3:33 PM

## 2024-02-23 NOTE — Patient Instructions (Addendum)
  Follow-Up: At [facility], you and your health needs are our priority.  As part of our continuing mission to provide you with exceptional heart care, our providers are all part of one team.  This team includes your primary Cardiologist (physician) and Advanced Practice Providers or APPs (Physician Assistants and Nurse Practitioners) who all work together to provide you with the care you need, when you need it.  Your next appointment:   4 months  Provider:   Maude Emmer, MD

## 2024-02-26 ENCOUNTER — Encounter: Payer: Self-pay | Admitting: Nurse Practitioner

## 2024-02-27 ENCOUNTER — Other Ambulatory Visit (HOSPITAL_COMMUNITY): Payer: Self-pay

## 2024-02-27 ENCOUNTER — Ambulatory Visit: Payer: Self-pay

## 2024-03-01 ENCOUNTER — Encounter: Payer: Self-pay | Admitting: Family Medicine

## 2024-03-01 DIAGNOSIS — Z7901 Long term (current) use of anticoagulants: Secondary | ICD-10-CM

## 2024-03-01 NOTE — Progress Notes (Signed)
 Spoke with pt. Pt is aware that He should resume Coumadin  per Dr. Delford. PT is aware that Coumadin  is a lifelong medication that should be taken in the setting of Atrial Fib, and popliteal aneurysm. Pt would prefer to have his PCP manage Coumadin  since his PCP office is closer to him. Pt advised to contact his PCP to start back on Coumadin .

## 2024-03-01 NOTE — Telephone Encounter (Signed)
 Would recommend pt restart prior warfarin dosing. Using 2.5 mg tablets, prior dosing was;  Continue 2 1/2 tablets (6.25 mg) daily except take 2 tablets (5 mg) on Mondays.   Check INR in 1 week.

## 2024-03-02 ENCOUNTER — Ambulatory Visit: Admitting: Family Medicine

## 2024-03-02 MED ORDER — WARFARIN SODIUM 2.5 MG PO TABS: ORAL_TABLET | ORAL | 1 refills | Status: DC

## 2024-03-07 ENCOUNTER — Ambulatory Visit

## 2024-03-21 ENCOUNTER — Ambulatory Visit

## 2024-03-21 DIAGNOSIS — Z7901 Long term (current) use of anticoagulants: Secondary | ICD-10-CM

## 2024-03-21 LAB — POCT INR: INR: 2.4 (ref 2.0–3.0)

## 2024-03-21 NOTE — Progress Notes (Signed)
 Pt had a fall off a ladder at the beginning of June and was hospitalized with a small brain bleed. At that time pt decided he would stop warfarin for fear of bleeding. He reported at that time he wold talk to cardiology concerning whether he should restart the warfarin or d/c. Cardiology advised to restart. Pt restarted warfarin on 7/25, using same dosing as before the fall.  Continue  2 1/2 tablets daily except take 2 tablet on Monday. Recheck in 6 weeks.

## 2024-03-21 NOTE — Patient Instructions (Addendum)
 Pre visit review using our clinic review tool, if applicable. No additional management support is needed unless otherwise documented below in the visit note.  Continue  2 1/2 tablets daily except take 2 tablet on Monday. Recheck in 6 weeks.

## 2024-03-23 ENCOUNTER — Other Ambulatory Visit: Payer: Self-pay

## 2024-03-23 DIAGNOSIS — I724 Aneurysm of artery of lower extremity: Secondary | ICD-10-CM

## 2024-04-12 ENCOUNTER — Other Ambulatory Visit: Payer: Self-pay | Admitting: Family Medicine

## 2024-04-12 DIAGNOSIS — Z7901 Long term (current) use of anticoagulants: Secondary | ICD-10-CM

## 2024-04-16 NOTE — Telephone Encounter (Signed)
 Pt is compliant with warfarin management and PCP apts.  Sent in refill of warfarin to requested pharmacy.

## 2024-04-20 ENCOUNTER — Other Ambulatory Visit: Payer: Self-pay | Admitting: Surgery

## 2024-04-20 DIAGNOSIS — Z8679 Personal history of other diseases of the circulatory system: Secondary | ICD-10-CM

## 2024-04-30 ENCOUNTER — Ambulatory Visit

## 2024-05-02 ENCOUNTER — Ambulatory Visit

## 2024-05-02 DIAGNOSIS — Z7901 Long term (current) use of anticoagulants: Secondary | ICD-10-CM

## 2024-05-02 LAB — POCT INR: INR: 2.9 (ref 2.0–3.0)

## 2024-05-02 NOTE — Patient Instructions (Addendum)
 Pre visit review using our clinic review tool, if applicable. No additional management support is needed unless otherwise documented below in the visit note.  Continue  2 1/2 tablets daily except take 2 tablet on Monday. Recheck in 6 weeks.

## 2024-05-02 NOTE — Progress Notes (Signed)
 Continue  2 1/2 tablets daily except take 2 tablet on Monday. Recheck in 6 weeks.

## 2024-05-04 ENCOUNTER — Ambulatory Visit (HOSPITAL_COMMUNITY)
Admission: RE | Admit: 2024-05-04 | Discharge: 2024-05-04 | Disposition: A | Discharge status: Home / Self Care | Source: Ambulatory Visit | Attending: Vascular Surgery | Admitting: Vascular Surgery

## 2024-05-04 ENCOUNTER — Ambulatory Visit (HOSPITAL_COMMUNITY)

## 2024-05-04 VITALS — BP 130/61 | HR 50 | Temp 97.8°F | Resp 18 | Ht 68.0 in | Wt 170.0 lb

## 2024-05-04 DIAGNOSIS — I724 Aneurysm of artery of lower extremity: Secondary | ICD-10-CM

## 2024-05-04 LAB — VAS US ABI WITH/WO TBI

## 2024-05-04 NOTE — Progress Notes (Signed)
 Office Note     CC:  follow up Requesting Provider:  Micheal Wolm ORN, MD  HPI: Don Medina is a 83 y.o. (1940-11-17) male who presents for surveillance of popliteal aneurysms.  At last office visit in February 2024 the right popliteal artery measured 2 cm in diameter.  He denies any rest pain or tissue loss of bilateral lower extremities.  He exercises 5 days a week.  He denies tobacco use.  Surgical history significant for ascending aortic dissection repaired by Dr. Army and Dr. Eliza in 2018.  Past Medical History:  Diagnosis Date   ADJ DISORDER WITH MIXED ANXIETY & DEPRESSED MOOD 09/08/2009   Qualifier: Diagnosis of  By: Micheal MD, Bruce     Arthritis    Atrial fibrillation (HCC) [I48.91] 05/23/2017   Blood in the stool    BRADYCARDIA 03/10/2010   Qualifier: Diagnosis of  By: Micheal MD, Bruce     CHEST PAIN 03/19/2010   Qualifier: Diagnosis of  By: York, LPN, Christine     Chicken pox    COLONIC POLYPS, HX OF 01/13/2009   Qualifier: Diagnosis of  By: Lavinia LPN, Nancy     Episode of generalized weakness 02/17/2016   Essential hypertension 03/15/2022   GERD (gastroesophageal reflux disease) 10/03/2012   Hay fever    History of colonic polyps    History of colonoscopy    Hyperlipidemia    MUSCLE STRAIN, HAMSTRING MUSCLE 09/08/2009   Qualifier: Diagnosis of  By: Micheal MD, Wolm DRAIN DIZZINESS 03/05/2010   Qualifier: Diagnosis of  By: Micheal MD, Bruce     Other premature beats 03/17/2010   Qualifier: Diagnosis of  By: Waddell, MD, CODY Danelle Elsie    Premature ventricular contraction 01/05/2011   S/P aortic dissection repair 05/08/2017   THROMBOCYTOPENIA 03/05/2010   Qualifier: Diagnosis of  By: Micheal MD, Bruce     Weakness 12/09/2015    Past Surgical History:  Procedure Laterality Date   ABDOMINAL ADHESION SURGERY  2007   AORTIC INTERVENTION N/A 05/08/2017   Procedure: HYPOTHERMIC CIRCULATORY ARREST;  Surgeon: Army Dallas NOVAK, MD;   Location: [facility] OR;  Service: Open Heart Surgery;  Laterality: N/A;   ASCENDING AORTIC ROOT REPLACEMENT N/A 05/08/2017   Procedure: REPLACEMENT OF ASCENDING AORTIC ARCH;  Surgeon: Army Dallas NOVAK, MD;  Location: [facility] OR;  Service: Open Heart Surgery;  Laterality: N/A;   CANNULATION FOR CARDIOPULMONARY BYPASS Right 05/08/2017   Procedure: AXILLARY CANNULATION;  Surgeon: Army Dallas NOVAK, MD;  Location: [facility] OR;  Service: Open Heart Surgery;  Laterality: Right;   ENDOVASCULAR STENT GRAFT (AAA)  05/08/2017   Procedure: STENT OF LEFT SUBCLAVIAN ARTERY with 10x39VBX;  Surgeon: Army Dallas NOVAK, MD;  Location: [facility] OR;  Service: Open Heart Surgery;;   herniated diks surgery L-5  1996   herniated disk surgery c 6-7  1986   INTRAOPERATIVE TRANSESOPHAGEAL ECHOCARDIOGRAM N/A 05/08/2017   Procedure: INTRAOPERATIVE TRANSESOPHAGEAL ECHOCARDIOGRAM;  Surgeon: Army Dallas NOVAK, MD;  Location: [facility] OR;  Service: Open Heart Surgery;  Laterality: N/A;   REPLACEMENT ASCENDING AORTA N/A 05/08/2017   Procedure: REPLACEMENT ASCENDING AORTA;  Surgeon: Army Dallas NOVAK, MD;  Location[facility] OR;  Service: Open Heart Surgery;  Laterality: N/A;   THORACIC AORTIC ENDOVASCULAR STENT GRAFT  05/08/2017   Procedure: STENT GRAFTING OF DESCENDING THORACIC AORTA;  Surgeon: Army Dallas NOVAK, MD;  Location: [facility] OR;  Service: Open Heart Surgery;;    Social History   Socioeconomic History   Marital status: Married  Spouse name: Not on file   Number of children: Not on file   Years of education: Not on file   Highest education level: Bachelor's degree (e.g., BA, AB, BS)  Occupational History   Occupation: Retired  Tobacco Use   Smoking status: Former    Current packs/day: 0.00    Types: Cigarettes    Quit date: 08/09/1974    Years since quitting: 49.7   Smokeless tobacco: Never  Vaping Use   Vaping status: Never Used  Substance and Sexual Activity   Alcohol use: No   Drug use: No   Sexual activity: Not Currently  Other  Topics Concern   Not on file  Social History Narrative   Not on file   Social Drivers of Health   Financial Resource Strain: Low Risk  (09/13/2023)   Overall Financial Resource Strain (CARDIA)    Difficulty of Paying Living Expenses: Not hard at all  Food Insecurity: No Food Insecurity (09/13/2023)   Hunger Vital Sign    Worried About Running Out of Food in the Last Year: Never true    Ran Out of Food in the Last Year: Never true  Transportation Needs: No Transportation Needs (09/13/2023)   PRAPARE - Administrator, Civil Service (Medical): No    Lack of Transportation (Non-Medical): No  Physical Activity: Sufficiently Active (09/13/2023)   Exercise Vital Sign    Days of Exercise per Week: 5 days    Minutes of Exercise per Session: 30 min  Stress: No Stress Concern Present (09/13/2023)   [employer] of Occupational Health - Occupational Stress Questionnaire    Feeling of Stress : Not at all  Social Connections: Moderately Integrated (09/13/2023)   Social Connection and Isolation Panel    Frequency of Communication with Friends and Family: More than three times a week    Frequency of Social Gatherings with Friends and Family: More than three times a week    Attends Religious Services: Never    Database administrator or Organizations: Yes    Attends Banker Meetings: Never    Marital Status: Married  Catering manager Violence: Not on file    Family History  Problem Relation Age of Onset   Alzheimer's disease Father 19   Dementia Father    Stroke Mother 35   Cancer Brother        Leukemia   Sudden death Other        family   Hypertension Other    Heart disease Neg Hx     Current Outpatient Medications  Medication Sig Dispense Refill   citalopram  (CELEXA ) 20 MG tablet TAKE 1 TABLET BY MOUTH EVERY DAY 90 tablet 0   desonide (DESOWEN) 0.05 % cream Apply topically as needed.     folic acid  (FOLVITE ) 1 MG tablet Take 1 tablet by mouth daily.      hydrochlorothiazide  (MICROZIDE ) 12.5 MG capsule TAKE 1 CAPSULE BY MOUTH EVERY DAY 90 capsule 1   metoprolol  tartrate (LOPRESSOR ) 25 MG tablet TAKE 1/2 TABLET TWICE A DAY BY MOUTH 90 tablet 3   Misc Natural Products (GLUCOS-CHONDROIT-MSM COMPLEX) TABS Take 1 tablet by mouth 3 (three) times daily.     Omega-3 Fatty Acids (FISH OIL) 1000 MG CAPS Take 1 capsule by mouth daily.     simvastatin  (ZOCOR ) 20 MG tablet Take 1 tablet (20 mg total) by mouth at bedtime. 90 tablet 3   warfarin (COUMADIN ) 2.5 MG tablet TAKE 2 1/2 TABLETS BY MOUTH DAILY  EXCEPT TAKE 2 TABLETS ON MONDAYS  OR AS DIRECTED BY CLINIC 195 tablet 1   No current facility-administered medications for this visit.    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 Recent studies have raised concern that fluoroquinolone antibiotics could be associated with an increased risk of aortic aneurysm Fluoroquinolones have non-antimicrobial properties that might jeopardise the integrity of the extracellular matrix of the vascular wall In a  propensity score matched cohort study in [place], there was a 66% increased rate of aortic aneurysm or dissection associated with oral fluoroquinolone use, compared wit     REVIEW OF SYSTEMS:  Negative unless noted in HPI [X]  denotes positive finding, [ ]  denotes negative finding Cardiac  Comments:  Chest pain or chest pressure:    Shortness of breath upon exertion:    Short of breath when lying flat:    Irregular heart rhythm:        Vascular    Pain in calf, thigh, or hip brought on by ambulation:    Pain in feet at night that wakes you up from your sleep:     Blood clot in your veins:    Leg swelling:         Pulmonary    Oxygen at home:    Productive cough:     Wheezing:         Neurologic    Sudden weakness in arms or legs:     Sudden numbness in arms or  legs:     Sudden onset of difficulty speaking or slurred speech:    Temporary loss of vision in one eye:     Problems with dizziness:         Gastrointestinal    Blood in stool:     Vomited blood:         Genitourinary    Burning when urinating:     Blood in urine:        Psychiatric    Major depression:         Hematologic    Bleeding problems:    Problems with blood clotting too easily:        Skin    Rashes or ulcers:        Constitutional    Fever or chills:      PHYSICAL EXAMINATION:  Vitals:   05/04/24 1441  BP: 130/61  Pulse: (!) 50  Resp: 18  Temp: 97.8 F (36.6 C)  TempSrc: Temporal  SpO2: 98%  Weight: 170 lb (77.1 kg)  Height: 5' 8 (1.727 m)    General:  WDWN in NAD; vital signs documented above Gait: Not observed HENT: WNL, normocephalic Pulmonary: normal non-labored breathing Cardiac: regular HR Abdomen: soft, NT, no masses Skin: without rashes Vascular Exam/Pulses: Palpable PT pulses; prominent popliteal pulses BLE Extremities: without ischemic changes, without Gangrene , without cellulitis; without open wounds;  Musculoskeletal: no muscle wasting or atrophy  Neurologic: A&O X 3 Psychiatric:  The pt has Normal affect.   Non-Invasive Vascular Imaging:   1.99 cm right popliteal artery  1.4 cm left popliteal artery  ABI/TBIToday's ABIToday's TBIPrevious ABIPrevious TBI  +-------+-----------+-----------+------------+------------+  Right [place]         0.86       [place]          1.12          +-------+-----------+-----------+------------+------------+  Left  [place]         0.65       [place]          1.06             ASSESSMENT/PLAN:: 83 y.o. male here for follow up for surveillance of popliteal artery aneurysms  Subjectively Don Medina has been doing well since last office visit.  Duplex demonstrates stable popliteal aneurysms bilaterally.  The right popliteal aneurysm measures 1.99 cm in largest diameter.  No indication for repair at  this time.  We will repeat imaging in another 18 months.  He will notify the office if he develops any rest pain or tissue loss of either lower extremity in the meantime.   Donnice Sender, PA-C [facility] [phone]  Clinic MD:   Gretta on call

## 2024-05-04 NOTE — Progress Notes (Signed)
 Don Medina                                          MRN: [id number]   05/04/2024   The [facility] Quality Team Specialist reviewed this patient medical record for the purposes of chart review for care gap closure. The following were reviewed: chart review for care gap closure-kidney health evaluation for diabetes:eGFR  and uACR.    [facility] Quality Team

## 2024-05-30 ENCOUNTER — Ambulatory Visit (HOSPITAL_COMMUNITY)
Admission: RE | Admit: 2024-05-30 | Discharge: 2024-05-30 | Disposition: A | Discharge status: Home / Self Care | Source: Ambulatory Visit | Attending: Cardiology | Admitting: Cardiology

## 2024-05-30 DIAGNOSIS — Z8679 Personal history of other diseases of the circulatory system: Secondary | ICD-10-CM | POA: Diagnosis present

## 2024-05-30 LAB — POCT I-STAT CREATININE: Creatinine, Ser: 1.2 mg/dL (ref 0.61–1.24)

## 2024-05-30 MED ORDER — IOHEXOL 350 MG/ML SOLN
75.0000 mL | Freq: Once | INTRAVENOUS | Status: AC | PRN
  Administered 2024-05-30: 75 mL via INTRAVENOUS

## 2024-06-06 ENCOUNTER — Ambulatory Visit: Admitting: Surgery

## 2024-06-08 ENCOUNTER — Other Ambulatory Visit: Payer: Self-pay | Admitting: Family Medicine

## 2024-06-11 ENCOUNTER — Ambulatory Visit

## 2024-06-11 DIAGNOSIS — Z7901 Long term (current) use of anticoagulants: Secondary | ICD-10-CM | POA: Diagnosis not present

## 2024-06-11 LAB — POCT INR: INR: 2.6 (ref 2.0–3.0)

## 2024-06-11 NOTE — Patient Instructions (Addendum)
 Pre visit review using our clinic review tool, if applicable. No additional management support is needed unless otherwise documented below in the visit note.  Continue  2 1/2 tablets daily except take 2 tablet on Monday. Recheck in 6 weeks.

## 2024-06-11 NOTE — Progress Notes (Signed)
 Indication: Afib Continue  2 1/2 tablets daily except take 2 tablet on Monday. Recheck in 6 weeks.

## 2024-06-12 LAB — LAB REPORT - SCANNED
A1c: 6.1
EGFR: 69

## 2024-06-19 NOTE — Progress Notes (Signed)
 CARDIOLOGY OFFICE NOTE  Date:  06/25/2024    Don Medina Date of Birth: Feb 16, 1941 Medical Record #[id number]  PCP:  Micheal Wolm ORN, MD  Cardiologist:  Army ODESSIA Emmer   No chief complaint on file.    History of Present Illness: Don Medina is a 83 y.o. male seen f/u  for acute type A aortic dissection.    He has a history of HLD and PVCs. No history of CAD, heart attack, HLD or DM. No prior tobacco use. Had new onset chest and back pain September 2018 with dissection BP managed by primary now on beta blocker, diuretic and ARB    He was taken to the OR by Dr. Army 05/08/17 he underwent grafting of the ascending aorta above the sinus with resuspension of AV  Right innominate tied off. Balloon stent graft extending past left subclavian with graft to graft in ascending aorta bifucated to innominate and carotid and graft to graft left subclavian. Also noted thrombosis of the left subclavian vein   Post op had some PAF Rx with coumadin  and amiodarone . 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    Echo  11/29/19 Normal EF trivial AR  Cardiac CTA 06/09/17 with no significant CAD stable repair   Had right popliteal aneurysm Last duplex  05/2023 stable 2.0 cm   Daughter in [place]  has liver failure from ETOH   Has been maintained on coumadin  for PAF and left axillary vein thrombosis   Review of INR;s have been therapeutic for a year INR now followed by primary office closer to him INR 2.6 06/11/24   Doing well no bleeding issues Compliant with meds No dizziness He continues to feel well and goes to the gym 5 days/week doing an elliptical machine and lifting weights   He indicates had labs at [place] earlier this year They are not in Epic   Past Medical History:  Diagnosis Date   ADJ DISORDER WITH MIXED ANXIETY & DEPRESSED MOOD 09/08/2009   Qualifier: Diagnosis of  By: Micheal MD, Bruce     Arthritis    Atrial fibrillation (HCC) [I48.91] 05/23/2017    Blood in the stool    BRADYCARDIA 03/10/2010   Qualifier: Diagnosis of  By: Micheal MD, Bruce     CHEST PAIN 03/19/2010   Qualifier: Diagnosis of  By: Cornelious, LPN, Christine     Chicken pox    COLONIC POLYPS, HX OF 01/13/2009   Qualifier: Diagnosis of  By: Lavinia LPN, Nancy     Episode of generalized weakness 02/17/2016   Essential hypertension 03/15/2022   GERD (gastroesophageal reflux disease) 10/03/2012   Hay fever    History of colonic polyps    History of colonoscopy    Hyperlipidemia    MUSCLE STRAIN, HAMSTRING MUSCLE 09/08/2009   Qualifier: Diagnosis of  By: Micheal MD, Wolm DRAIN DIZZINESS 03/05/2010   Qualifier: Diagnosis of  By: Micheal MD, Bruce     Other premature beats 03/17/2010   Qualifier: Diagnosis of  By: Waddell, MD, CODY Danelle Don    Premature ventricular contraction 01/05/2011   S/P aortic dissection repair 05/08/2017   THROMBOCYTOPENIA 03/05/2010   Qualifier: Diagnosis of  By: Micheal MD, Bruce     Weakness 12/09/2015    Past Surgical History:  Procedure Laterality Date   ABDOMINAL ADHESION SURGERY  2007   AORTIC INTERVENTION N/A 05/08/2017   Procedure: HYPOTHERMIC CIRCULATORY ARREST;  Surgeon: Army Dallas NOVAK, MD;  Location: [facility] OR;  Service: Open  Heart Surgery;  Laterality: N/A;   ASCENDING AORTIC ROOT REPLACEMENT N/A 05/08/2017   Procedure: REPLACEMENT OF ASCENDING AORTIC ARCH;  Surgeon: Army Dallas NOVAK, MD;  Location[facility] OR;  Service: Open Heart Surgery;  Laterality: N/A;   CANNULATION FOR CARDIOPULMONARY BYPASS Right 05/08/2017   Procedure: AXILLARY CANNULATION;  Surgeon: Army Dallas NOVAK, MD;  Location: [facility] OR;  Service: Open Heart Surgery;  Laterality: Right;   ENDOVASCULAR STENT GRAFT (AAA)  05/08/2017   Procedure: STENT OF LEFT SUBCLAVIAN ARTERY with 10x39VBX;  Surgeon: Army Dallas NOVAK, MD;  Location: [facility] OR;  Service: Open Heart Surgery;;   herniated diks surgery L-5  1996   herniated disk surgery c 6-7  1986   INTRAOPERATIVE  TRANSESOPHAGEAL ECHOCARDIOGRAM N/A 05/08/2017   Procedure: INTRAOPERATIVE TRANSESOPHAGEAL ECHOCARDIOGRAM;  Surgeon: Army Dallas NOVAK, MD;  Location: [facility] OR;  Service: Open Heart Surgery;  Laterality: N/A;   REPLACEMENT ASCENDING AORTA N/A 05/08/2017   Procedure: REPLACEMENT ASCENDING AORTA;  Surgeon: Army Dallas NOVAK, MD;  Location: [facility] OR;  Service: Open Heart Surgery;  Laterality: N/A;   THORACIC AORTIC ENDOVASCULAR STENT GRAFT  05/08/2017   Procedure: STENT GRAFTING OF DESCENDING THORACIC AORTA;  Surgeon: Army Dallas NOVAK, MD;  Location: [facility] OR;  Service: Open Heart Surgery;;     Medications: Current Meds  Medication Sig   citalopram  (CELEXA ) 20 MG tablet TAKE 1 TABLET BY MOUTH EVERY DAY   desonide (DESOWEN) 0.05 % cream Apply topically as needed.   folic acid  (FOLVITE ) 1 MG tablet Take 1 tablet by mouth daily.   hydrochlorothiazide  (MICROZIDE ) 12.5 MG capsule TAKE 1 CAPSULE BY MOUTH EVERY DAY   metoprolol  tartrate (LOPRESSOR ) 25 MG tablet TAKE 1/2 TABLET TWICE A DAY BY MOUTH   Misc Natural Products (GLUCOS-CHONDROIT-MSM COMPLEX) TABS Take 1 tablet by mouth 3 (three) times daily.   Omega-3 Fatty Acids (FISH OIL) 1000 MG CAPS Take 1 capsule by mouth daily.   simvastatin  (ZOCOR ) 20 MG tablet Take 1 tablet (20 mg total) by mouth at bedtime.   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 Recent studies have raised concern that fluoroquinolone antibiotics could be associated with an increased risk of aortic aneurysm Fluoroquinolones have non-antimicrobial properties that might jeopardise the integrity of the extracellular matrix of the vascular wall In a  propensity score matched cohort study in [place], there  was a 66% increased rate of aortic aneurysm or dissection associated with oral fluoroquinolone use, compared wit    Social History: The patient  reports that he quit smoking about 49 years ago. His smoking use included cigarettes. He has never used smokeless tobacco. He reports that he does not drink alcohol and does not use drugs.   Family History: The patient's family history includes Alzheimer's disease (age of onset: 85) in his father; Cancer in his brother; Dementia in his father; Hypertension in an other family member; Stroke (age of onset: 40) in his mother; Sudden death in an other family member.   Review of Systems: Please see the history of present illness.   Otherwise, the review of systems is positive for none.   All other systems are reviewed and negative.   Physical Exam: VS:  BP 128/60 (BP Location: Right Arm, Patient Position: Sitting, Cuff Size: Normal)   Pulse (!) 55   Resp 16   Ht 5' 8 (1.727 m)   Wt 173 lb 9.6 oz (78.7 kg)   SpO2 95%   BMI 26.40 kg/m  .  BMI Body mass index is 26.4 kg/m.  Wt Readings from Last 3 Encounters:  06/25/24 173 lb 9.6 oz (78.7 kg)  06/20/24 170 lb (77.1 kg)  05/04/24 170 lb (77.1 kg)    Affect appropriate Healthy:  appears stated age HEENT: normal Neck supple with no adenopathy JVP normal no bruits no thyromegaly Lungs clear with no wheezing and good diaphragmatic motion Heart:  S1/S2 AR murmur, no rub, gallop or click PMI normal post sternotomy  Abdomen: benighn, BS positve, no tenderness, no AAA no bruit.  No HSM or HJR Distal pulses intact with no bruits No edema Neuro non-focal Skin warm and dry No muscular weakness    LABORATORY DATA:  EKG:   06/25/2024 SR rate 54 nonspecific ST changes   Lab Results  Component Value Date   WBC 9.2 01/09/2024   HGB 13.4 01/09/2024   HCT 37.3 (L) 01/09/2024   PLT 113 (L) 01/09/2024   GLUCOSE 111 (H) 01/09/2024   CHOL 131 09/14/2023   TRIG 102.0 09/14/2023   HDL 48.30  09/14/2023   LDLCALC 62 09/14/2023   ALT 13 09/14/2023   AST 24 09/14/2023   NA 136 01/09/2024   K 3.5 01/09/2024   CL 102 01/09/2024   CREATININE 1.20 05/30/2024   BUN 16 01/09/2024   CO2 27 01/09/2024   TSH 5.980 (H) 06/01/2017   PSA 1.25 10/12/2021   INR 2.6 06/11/2024   HGBA1C 5.9 (H) 01/08/2024     BNP (last 3 results) No results for input(s): BNP in the last 8760 hours.  ProBNP (last 3 results) No results for input(s): PROBNP in the last 8760 hours.   Other Studies Reviewed Today:  Vascular LE Artery: 03/25/21 Summary:  Right: Patent popliteal artery with maximum diameter measuring 1.94 cm.   Left: Patent popliteal artery with maximum diameter measuring 1.30 cm.    TTE:  11/29/19    IMPRESSIONS     1. Normal GLS - 20.1 . Left ventricular ejection fraction, by estimation,  is 60 to 65%. The left ventricle has normal function. The left ventricle  has no regional wall motion abnormalities. Left ventricular diastolic  parameters were normal.   2. Right ventricular systolic function is normal. The right ventricular  size is normal.   3. Left atrial size was moderately dilated.   4. The mitral valve is normal in structure. No evidence of mitral valve  regurgitation. No evidence of mitral stenosis.   5. The aortic valve is tricuspid. Aortic valve regurgitation is trivial.  No aortic stenosis is present.   6. Post type A dissection with repair no residual intimal defect noted  and normal aortic root size.   7. The inferior vena cava is normal in size with greater than 50%  respiratory variability, suggesting right atrial pressure of 3 mmHg.   Comparison(s): 04/11/18 EF 55-60%.    Assessment/Plan:   1. Type A Aortic Dissection F/U CVTS. BP under good control  CTA 05/30/24  with stable tube fraft repair of ascending thoracic aorta and stent grafting of arch and descending thoracic aorta.  F/U Dr Lucas   2. Orthostatic hypotension/dizziness - improved with  lower BP meds    3. Post op AF - now  off amiodarone  maintaining NSR coumadin  followed by primary office   4. +HIT noted PLT still down 115 10/12/21 Stable on labs 01/09/24 PLT 113   5. Popliteal Aneurysm:  Duplex 03/25/21 right popliteal stable 2.0  cm stable on duplex 05/04/24    6. HTN:  improved with addition of Loartan / HCTZ by Dr Micheal   Given PAF, popliteal aneurysm ( currently no mural thrombus to potentially embolize distally given anticoagulation ) And thrombosis of the left subclavian vein would suggest that life long coumadin  appropriate    Echo Afib/Aortic dissection   F/U in a year   [employer]

## 2024-06-20 ENCOUNTER — Ambulatory Visit: Attending: Surgery | Admitting: Surgery

## 2024-06-20 ENCOUNTER — Encounter: Payer: Self-pay | Admitting: Surgery

## 2024-06-20 VITALS — BP 137/68 | HR 58 | Resp 18 | Ht 68.0 in | Wt 170.0 lb

## 2024-06-20 DIAGNOSIS — Z8679 Personal history of other diseases of the circulatory system: Secondary | ICD-10-CM | POA: Diagnosis not present

## 2024-06-21 NOTE — Progress Notes (Signed)
 [address], [place] [address]             [phone]    HPI:  The patient is an 83 year old gentleman who underwent repair of an acute type 1 aortic dissection by Dr. Army on 05/08/2017.  He had resuspension repair of the aortic valve with replacement of the ascending aorta, arch de-branching, and frozen elephant trunk with placement of a 31 x 31 x 10 cm CTAG Gore stent graft, placement of a 10 x 39 VBX balloon expandable stent into the left subclavian artery, a 12 x 8-cm graft off the ascending aorta to the innominate and left carotid arteries.  He continues to feel well and goes to the gym several days per week.  He has been followed by vascular surgery for bilateral popliteal artery aneurysms measuring 2 cm in largest diameter.  He fell off a ladder in June 2025 and suffered significant contusions and a laceration to his face as well as a small subarachnoid and subdural hemorrhage on Coumadin .  He completely recovered from that.  Current Outpatient Medications  Medication Sig Dispense Refill   citalopram  (CELEXA ) 20 MG tablet TAKE 1 TABLET BY MOUTH EVERY DAY 90 tablet 0   desonide (DESOWEN) 0.05 % cream Apply topically as needed.     folic acid  (FOLVITE ) 1 MG tablet Take 1 tablet by mouth daily.     hydrochlorothiazide  (MICROZIDE ) 12.5 MG capsule TAKE 1 CAPSULE BY MOUTH EVERY DAY 90 capsule 0   metoprolol  tartrate (LOPRESSOR ) 25 MG tablet TAKE 1/2 TABLET TWICE A DAY BY MOUTH 90 tablet 3   Misc Natural Products (GLUCOS-CHONDROIT-MSM COMPLEX) TABS Take 1 tablet by mouth 3 (three) times daily.     Omega-3 Fatty Acids (FISH OIL) 1000 MG CAPS Take 1 capsule by mouth daily.     simvastatin  (ZOCOR ) 20 MG tablet Take 1 tablet (20 mg total) by mouth at bedtime. 90 tablet 3   warfarin (COUMADIN ) 2.5 MG tablet TAKE 2 1/2 TABLETS BY MOUTH DAILY EXCEPT TAKE 2 TABLETS ON MONDAYS  OR AS DIRECTED BY CLINIC 195 tablet 1   No current facility-administered medications for this  visit.     Physical Exam: BP 137/68 (BP Location: Right Arm)   Pulse (!) 58   Resp 18   Ht 5' 8 (1.727 m)   Wt 170 lb (77.1 kg)   SpO2 95%   BMI 25.85 kg/m  He looks well. Cardiac exam shows a regular rate and rhythm with normal heart sounds.  There is a 2/6 systolic flow murmur along the right sternal border.  There is no diastolic murmur. Lungs are clear.  Diagnostic Tests:  Narrative & Impression  CLINICAL DATA:  Surveillance of known aortic dissection.   EXAM: CT ANGIOGRAPHY CHEST WITH CONTRAST   TECHNIQUE: Multidetector CT imaging of the chest was performed using the standard protocol during bolus administration of intravenous contrast. Multiplanar CT image reconstructions and MIPs were obtained to evaluate the vascular anatomy.   RADIATION DOSE REDUCTION: This exam was performed according to the departmental dose-optimization program which includes automated exposure control, adjustment of the mA and/or kV according to patient size and/or use of iterative reconstruction technique.   CONTRAST:  75mL OMNIPAQUE IOHEXOL 350 MG/ML SOLN   COMPARISON:  05/30/2023   FINDINGS: Cardiovascular: Stable borderline cardiomegaly. Median sternotomy wires present. Evidence of patient's tube graft of the ascending thoracic aorta which is unchanged with patent stable side-branch supplying the right brachiocephalic and  left common carotid arteries.   Stable stent graft over the aortic arch beginning at the level of the takeoff of the left subclavian artery and extending to the proximal descending thoracic aorta. Previous seen minimal thrombus adjacent the proximal tines of the stent no longer visualized. Short segment stent over the origin/proximal left subclavian artery unchanged.   Persistent dissection flap over the native descending thoracic aorta beginning immediately distal to the stent graft unchanged with opacification of the true and false lumens. Aorta measures 3  cm in transverse diameter at the origin of the dissection flap unchanged. Dissection continues into the abdominal aorta as remainder of the descending thoracic aorta is unchanged.   Pulmonary arterial system is normal. Calcified plaque over the left anterior descending and right coronary arteries.   Mediastinum/Nodes: No mediastinal or hilar adenopathy. Remaining mediastinal structures are normal.   Lungs/Pleura: Lungs are adequately inflated without acute airspace consolidation or effusion. Airways are normal.   Upper Abdomen: Visualized upper abdomen demonstrates mild cholelithiasis. Continued evidence of dissection of the visualized abdominal aorta which is normal in caliber. Slight preferential opacification of the true lumen compared to the false lumen over the abdomen. True and false lumen appear to communicate along the right side of the aorta at the level of the takeoff of the SMA. Remainder of the upper abdomen is unchanged.   Musculoskeletal: Stable moderate L1 compression fracture with interval development of mild compression fractures of T11 and T12. Stable mild superior endplate compression of L2.   Review of the MIP images confirms the above findings.   IMPRESSION: 1. Stable appearance of the thoracic aorta with evidence of previous tube graft of the ascending thoracic aorta and stent graft over the aortic arch and proximal descending thoracic aorta. Stable dissection flap over the native descending thoracic aorta beginning immediately distal to the stent graft and extending into the abdominal aorta as described. 2. Stable borderline cardiomegaly. Atherosclerotic coronary artery disease. 3. Cholelithiasis. 4. Stable moderate L1 compression fracture with interval development of mild compression fractures of T11 and T12. Stable mild superior endplate compression of L2. 5. Aortic atherosclerosis.   Aortic Atherosclerosis (ICD10-I70.0).     Electronically  Signed   By: Toribio Agreste M.D.   On: 05/30/2024 10:39      Impression:  CTA of the chest continues to show a stable repair of his aortic dissection with a stable chronic dissection beyond the distal end of the stent graft extending into the abdominal aorta.  There are no significant aneurysmal changes.  I reviewed the CT images with him and answered all of his questions.  I stressed the importance of continued good blood pressure control in preventing further enlargement and acute aortic dissection.  I advised him against doing any heavy lifting that may require a Valsalva maneuver and could suddenly raise his blood pressure to high levels.   Plan:  He will return to see me in 1 year with a CTA of the chest, abdomen, and pelvis for aortic surveillance.  I spent 20 minutes performing this established patient evaluation and > 50% of this time was spent face to face counseling and coordinating the care of this patient's previous aortic dissection repair and chronic descending thoracic aortic dissection.   Dorise MARLA Fellers, MD [facility] [phone]

## 2024-06-22 ENCOUNTER — Encounter: Payer: Self-pay | Admitting: Cardiovascular Disease

## 2024-06-25 ENCOUNTER — Ambulatory Visit: Attending: Cardiovascular Disease | Admitting: Cardiovascular Disease

## 2024-06-25 ENCOUNTER — Encounter: Payer: Self-pay | Admitting: Cardiovascular Disease

## 2024-06-25 VITALS — BP 128/60 | HR 55 | Resp 16 | Ht 68.0 in | Wt 173.6 lb

## 2024-06-25 DIAGNOSIS — I4891 Unspecified atrial fibrillation: Secondary | ICD-10-CM | POA: Diagnosis not present

## 2024-06-25 DIAGNOSIS — E785 Hyperlipidemia, unspecified: Secondary | ICD-10-CM | POA: Diagnosis not present

## 2024-06-25 DIAGNOSIS — I10 Essential (primary) hypertension: Secondary | ICD-10-CM | POA: Diagnosis not present

## 2024-06-25 DIAGNOSIS — I7100 Dissection of unspecified site of aorta: Secondary | ICD-10-CM

## 2024-06-25 DIAGNOSIS — I724 Aneurysm of artery of lower extremity: Secondary | ICD-10-CM

## 2024-06-25 NOTE — Patient Instructions (Addendum)
 Medication Instructions:  Your physician recommends that you continue on your current medications as directed. Please refer to the Current Medication list given to you today.  *If you need a refill on your cardiac medications before your next appointment, please call your pharmacy*  Lab Work: NONE If you have labs (blood work) drawn today and your tests are completely normal, you will receive your results only by: MyChart Message (if you have MyChart) OR A paper copy in the mail If you have any lab test that is abnormal or we need to change your treatment, we will call you to review the results.  Testing/Procedures: Your physician has requested that you have an echocardiogram. Echocardiography is a painless test that uses sound waves to create images of your heart. It provides your doctor with information about the size and shape of your heart and how well your heart's chambers and valves are working. This procedure takes approximately one hour. There are no restrictions for this procedure. Please do NOT wear cologne, perfume, aftershave, or lotions (deodorant is allowed). Please arrive 15 minutes prior to your appointment time.  Please note: We ask at that you not bring children with you during ultrasound (echo/ vascular) testing. Due to room size and safety concerns, children are not allowed in the ultrasound rooms during exams. Our front office staff cannot provide observation of children in our lobby area while testing is being conducted. An adult accompanying a patient to their appointment will only be allowed in the ultrasound room at the discretion of the ultrasound technician under special circumstances. We apologize for any inconvenience.   Follow-Up: At [facility], you and your health needs are our priority.  As part of our continuing mission to provide you with exceptional heart care, our providers are all part of one team.  This team includes your primary Cardiologist  (physician) and Advanced Practice Providers or APPs (Physician Assistants and Nurse Practitioners) who all work together to provide you with the care you need, when you need it.  Your next appointment:   1 year(s)  Provider:   Maude Emmer, MD

## 2024-07-09 ENCOUNTER — Telehealth: Payer: Self-pay | Admitting: Family Medicine

## 2024-07-09 NOTE — Telephone Encounter (Signed)
 Patient dropped off labs from the [place] for the provider to review

## 2024-07-10 ENCOUNTER — Ambulatory Visit (HOSPITAL_COMMUNITY)
Admission: RE | Admit: 2024-07-10 | Discharge: 2024-07-10 | Attending: Cardiovascular Disease | Admitting: Cardiovascular Disease

## 2024-07-10 ENCOUNTER — Ambulatory Visit: Payer: Self-pay | Admitting: Cardiovascular Disease

## 2024-07-10 DIAGNOSIS — I4891 Unspecified atrial fibrillation: Secondary | ICD-10-CM | POA: Insufficient documentation

## 2024-07-10 DIAGNOSIS — I7100 Dissection of unspecified site of aorta: Secondary | ICD-10-CM | POA: Diagnosis present

## 2024-07-10 LAB — ECHOCARDIOGRAM COMPLETE
AR max vel: 3.24 cm2
AV Area VTI: 3.49 cm2
AV Area mean vel: 3.21 cm2
AV Mean grad: 5 mmHg
AV Peak grad: 9.6 mmHg
Ao pk vel: 1.55 m/s
Area-P 1/2: 4.12 cm2
S' Lateral: 3.28 cm

## 2024-07-11 NOTE — Progress Notes (Signed)
 Don Medina                                          MRN: [id number]   07/11/2024   The [facility] Quality Team Specialist reviewed this patient medical record for the purposes of chart review for care gap closure. The following were reviewed: chart review for care gap closure-kidney health evaluation for diabetes:eGFR  and uACR.    [facility]BCI Quality Team

## 2024-07-13 MED ORDER — METOPROLOL TARTRATE 25 MG PO TABS: ORAL_TABLET | ORAL | 1 refills | Status: AC

## 2024-07-22 ENCOUNTER — Encounter: Payer: Self-pay | Admitting: Gastroenterology

## 2024-07-23 ENCOUNTER — Ambulatory Visit

## 2024-07-23 DIAGNOSIS — Z7901 Long term (current) use of anticoagulants: Secondary | ICD-10-CM

## 2024-07-23 LAB — POCT INR: INR: 1.8 — AB (ref 2.0–3.0)

## 2024-07-23 NOTE — Patient Instructions (Addendum)
 Pre visit review using our clinic review tool, if applicable. No additional management support is needed unless otherwise documented below in the visit note.  Increase dose today to take 3 tablets and then continue  2 1/2 tablets daily except take 2 tablet on Monday. Recheck in 4 weeks.

## 2024-07-23 NOTE — Progress Notes (Signed)
 Indication: Afib Pt missed a dose 3 days ago. Increase dose today to take 3 tablets and then continue  2 1/2 tablets daily except take 2 tablet on Monday. Recheck in 4 weeks.

## 2024-07-26 ENCOUNTER — Ambulatory Visit: Admitting: Gastroenterology

## 2024-07-26 ENCOUNTER — Encounter: Payer: Self-pay | Admitting: Gastroenterology

## 2024-07-26 VITALS — BP 128/60 | HR 68 | Ht 68.0 in | Wt 172.0 lb

## 2024-07-26 DIAGNOSIS — K642 Third degree hemorrhoids: Secondary | ICD-10-CM

## 2024-07-26 DIAGNOSIS — Z7901 Long term (current) use of anticoagulants: Secondary | ICD-10-CM

## 2024-07-26 NOTE — Patient Instructions (Addendum)
 HEMORRHOID BANDING PROCEDURE    FOLLOW-UP CARE   The procedure you have had should have been relatively painless since the banding of the area involved does not have nerve endings and there is no pain sensation.  The rubber band cuts off the blood supply to the hemorrhoid and the band may fall off as soon as 48 hours after the banding (the band may occasionally be seen in the toilet bowl following a bowel movement). You may notice a temporary feeling of fullness in the rectum which should respond adequately to plain Tylenol  or Motrin.  Following the banding, avoid strenuous exercise that evening and resume full activity the next day.  A sitz bath (soaking in a warm tub) or bidet is soothing, and can be useful for cleansing the area after bowel movements.     To avoid constipation, take two tablespoons of natural wheat bran, natural oat bran, flax, Benefiber or any over the counter fiber supplement and increase your water intake to 7-8 glasses daily.    Unless you have been prescribed anorectal medication, do not put anything inside your rectum for two weeks: No suppositories, enemas, fingers, etc.  Occasionally, you may have more bleeding than usual after the banding procedure.  This is often from the untreated hemorrhoids rather than the treated one.  Dont be concerned if there is a tablespoon or so of blood.  If there is more blood than this, lie flat with your bottom higher than your head and apply an ice pack to the area. If the bleeding does not stop within a half an hour or if you feel faint, call our office at [phone] or go to the emergency room.  Problems are not common; however, if there is a substantial amount of bleeding, severe pain, chills, fever or difficulty passing urine (very rare) or other problems, you should call us  at [phone] or report to the nearest emergency room.  Do not stay seated continuously for more than 2-3 hours for a day or two after the procedure.   Tighten your buttock muscles 10-15 times every two hours and take 10-15 deep breaths every 1-2 hours.  Do not spend more than a few minutes on the toilet if you cannot empty your bowel; instead re-visit the toilet at a later time.   Thank you for entrusting me with your care and for choosing [facility], Dr. Elspeth Naval    _______________________________________________________  If your blood pressure at your visit was 140/90 or greater, please contact your primary care physician to follow up on this.  _______________________________________________________  If you are age 64 or older, your body mass index should be between 23-30. Your Body mass index is 26.15 kg/m. If this is out of the aforementioned range listed, please consider follow up with your Primary Care Provider.  If you are age 70 or younger, your body mass index should be between 19-25. Your Body mass index is 26.15 kg/m. If this is out of the aformentioned range listed, please consider follow up with your Primary Care Provider.   ________________________________________________________  The [facility] providers would like to encourage you to use MYCHART to communicate with providers for non-urgent requests or questions.  Due to long hold times on the telephone, sending your provider a message by [facility] may be a faster and more efficient way to get a response.  Please allow 48 business hours for a response.  Please remember that this is for non-urgent requests.  _______________________________________________________  [facility] is using a team-based approach to care.  Your team is made up of your doctor and two to three APPS. Our APPS (Nurse Practitioners and Physician Assistants) work with your physician to ensure care continuity for you. They are fully qualified to address your health concerns and develop a treatment plan. They communicate directly with your gastroenterologist to care for you.  Seeing the Advanced Practice Practitioners on your physician's team can help you by facilitating care more promptly, often allowing for earlier appointments, access to diagnostic testing, procedures, and other specialty referrals.

## 2024-07-26 NOTE — Progress Notes (Signed)
 83 year old male here for follow-up visit for his hemorrhoids.   He was last seen back in January of this year.  At that time he was doing really well.  We performed hemorrhoid banding of the left lateral hemorrhoid in November 2024.  He had grade 3 prolapse at the time with irritation.  Last colonoscopy 2017 at the [facility] and was normal.  He has a history of A-fib with aortic repair in the past on chronic Coumadin .  Given he had resolution of his symptoms after the first band we decided to hold off on further banding.  He states he had been doing pretty well for some time but a few weeks ago he had a firm BM and had significant bleeding with that.  He states since that time he has had some intermittent bleeding that he is noted in his underwear.  He endorses grade 3 prolapse that had initially resolved with the banding and has since come back.  He generally denies any firm stools and moves his bowels normally without straining.  He tolerated the last hemorrhoid banding well without any bleeding or pain.  We discussed how aggressive he wanted to be with management of this and if he wanted another banding or not.  Following discussions of risks and benefits of hemorrhoid banding, on Coumadin , his bleeding risk is slightly higher, he did not want to proceed with banding while on Coumadin .  Anoscopy done as outlined below which showed inflamed hemorrhoids with grade 3 prolapse, able to be reduced.  I think the cause of his symptoms  PROCEDURE NOTE: The patient presents with symptomatic grade III  hemorrhoids, requesting rubber band ligation of his/her hemorrhoidal disease.  All risks, benefits and alternative forms of therapy were described and informed consent was obtained.  In the Left Lateral Decubitus position anoscopic examination revealed grade III hemorrhoids in the RP and RA position(s).  The anorectum was pre-medicated with Recticare The decision was made to band the RP internal hemorrhoid, and  the [facility] ORegan System was used to perform band ligation without complication.  Digital anorectal examination was then performed to assure proper positioning of the band, and to adjust the banded tissue as required.  The patient was discharged home without pain or other issues.  Dietary and behavioral recommendations were given and along with follow-up instructions.     The following adjunctive treatments were recommended: Keep stools soft, fiber PRN  The patient will return as needed for  follow-up and possible additional banding as required. No complications were encountered and the patient tolerated the procedure well.  Marcey Naval, MD [facility]

## 2024-08-07 ENCOUNTER — Encounter: Payer: Self-pay | Admitting: Gastroenterology

## 2024-08-20 ENCOUNTER — Ambulatory Visit

## 2024-08-20 DIAGNOSIS — Z7901 Long term (current) use of anticoagulants: Secondary | ICD-10-CM

## 2024-08-20 LAB — POCT INR: INR: 2.8 (ref 2.0–3.0)

## 2024-08-20 NOTE — Progress Notes (Signed)
 Indication: Afib Pt had hemorrhoid banding on 12/18 and has a small amount of bleeding for a few weeks after. Pt contacted GI at that time. Pt reports now there is no further bleeding.  Continue  2 1/2 tablets daily except take 2 tablet on Monday. Recheck in 6 weeks.

## 2024-08-20 NOTE — Patient Instructions (Addendum)
 Pre visit review using our clinic review tool, if applicable. No additional management support is needed unless otherwise documented below in the visit note.  Continue  2 1/2 tablets daily except take 2 tablet on Monday. Recheck in 6 weeks.

## 2024-10-01 ENCOUNTER — Ambulatory Visit
